# Patient Record
Sex: Male | Born: 1940
Health system: Southern US, Community
[De-identification: ages and names within clinical notes are randomized; demographics above are authoritative.]

## PROBLEM LIST (undated history)

## (undated) DIAGNOSIS — I10 Essential (primary) hypertension: Secondary | ICD-10-CM

## (undated) DIAGNOSIS — I509 Heart failure, unspecified: Secondary | ICD-10-CM

## (undated) DIAGNOSIS — J449 Chronic obstructive pulmonary disease, unspecified: Secondary | ICD-10-CM

## (undated) DIAGNOSIS — I255 Ischemic cardiomyopathy: Secondary | ICD-10-CM

## (undated) DIAGNOSIS — I2510 Atherosclerotic heart disease of native coronary artery without angina pectoris: Secondary | ICD-10-CM

## (undated) DIAGNOSIS — E785 Hyperlipidemia, unspecified: Secondary | ICD-10-CM

## (undated) DIAGNOSIS — Z8614 Personal history of Methicillin resistant Staphylococcus aureus infection: Secondary | ICD-10-CM

## (undated) DIAGNOSIS — E119 Type 2 diabetes mellitus without complications: Secondary | ICD-10-CM

## (undated) DIAGNOSIS — N189 Chronic kidney disease, unspecified: Secondary | ICD-10-CM

## (undated) DIAGNOSIS — Z720 Tobacco use: Secondary | ICD-10-CM

## (undated) HISTORY — DX: Atherosclerotic heart disease of native coronary artery without angina pectoris: I25.10

## (undated) HISTORY — DX: Hyperlipidemia, unspecified: E78.5

## (undated) HISTORY — DX: Personal history of Methicillin resistant Staphylococcus aureus infection: Z86.14

## (undated) HISTORY — PX: TOTAL SHOULDER REPLACEMENT: SUR1217

## (undated) HISTORY — DX: Type 2 diabetes mellitus without complications: E11.9

## (undated) NOTE — *Deleted (*Deleted)
Speech Language Pathology Discharge Summary  Patient Details  Name: [name] MRN: [id number] Date of Birth: Jul 29, 1940  {chl ip rehab slp time calculations:[id number]}   Skilled Therapeutic Interventions:  ***     Patient has met 6 of 8 long term goals.  Patient to discharge at overall Mod level.  Reasons goals not met: increased moderate cueing required for basic problem solving, fluctuating levels of cueing required for sustained attention, depending on internal distractability and overall engagement/willingness to participate   Clinical Impression/Discharge Summary:   Pt made slow functional gains and met 6 out of 8 long term goals this admission, although most goals were downgraded during his stay. Pt currently requires Moderate assist for basic familiar tasks due to cognitive impairments impacting his attention, problem solving, intellectual and safety awareness, and short term memory. He has also demonstrated some behaviors that have limited his participation and engagement in functional therapy tasks, further limiting his progress. As a result of his current cognitive deficits, he will require strict 24/7 supervision and hands on care at discharge. Pt is consuming a puree (dys 1) texture diet with thin liquids and uses strategies for oral clearance with min a verbal cues from clinician. His speech is ~80% intelligible in conversation mainly due to low vocal intensity and mild articulatory imprecision; Min-Mod A cueing required for implementation of strategies to compensate for dysarthria. Pt has demonstrated improved sustained attention, oropharyngeal swallow function, and orientation. However, given significant cognitive-linguistic and oral phrase swallow deficits still present, recommend pt continue to receive skilled ST services upon discharge - SNF is recommended by ST given current swallow and cognitive-linguistic deficits and high level of care required for pt's safety. Comment on  education  Care Partner:  Caregiver Able to Provide Assistance: No     Recommendation:  Skilled Nursing facility;24 hour supervision/assistance  Rationale for SLP Follow Up: Maximize cognitive function and independence;Reduce caregiver burden;Maximize functional communication;Maximize swallowing safety   Equipment: none   Reasons for discharge: Discharged from hospital   Patient/Family Agrees with Progress Made and Goals Achieved: Yes    [name] 03/21/2020, 12:17 PM

---

## 2005-04-23 ENCOUNTER — Ambulatory Visit: Payer: Self-pay | Admitting: Cardiology

## 2005-04-23 ENCOUNTER — Inpatient Hospital Stay (HOSPITAL_COMMUNITY): Admission: EM | Admit: 2005-04-23 | Discharge: 2005-04-27 | Payer: Self-pay | Admitting: Emergency Medicine

## 2005-05-04 ENCOUNTER — Emergency Department (HOSPITAL_COMMUNITY): Admission: EM | Admit: 2005-05-04 | Discharge: 2005-05-05 | Payer: Self-pay | Admitting: Emergency Medicine

## 2005-05-08 ENCOUNTER — Emergency Department (HOSPITAL_COMMUNITY): Admission: EM | Admit: 2005-05-08 | Discharge: 2005-05-08 | Payer: Self-pay | Admitting: Emergency Medicine

## 2005-05-11 ENCOUNTER — Inpatient Hospital Stay (HOSPITAL_COMMUNITY): Admission: EM | Admit: 2005-05-11 | Discharge: 2005-05-21 | Payer: Self-pay | Admitting: Emergency Medicine

## 2005-05-13 ENCOUNTER — Encounter (INDEPENDENT_AMBULATORY_CARE_PROVIDER_SITE_OTHER): Payer: Self-pay | Admitting: *Deleted

## 2005-05-13 ENCOUNTER — Ambulatory Visit: Payer: Self-pay | Admitting: Infectious Diseases

## 2005-05-28 ENCOUNTER — Encounter (HOSPITAL_BASED_OUTPATIENT_CLINIC_OR_DEPARTMENT_OTHER): Admission: RE | Admit: 2005-05-28 | Discharge: 2005-06-02 | Payer: Self-pay | Admitting: Surgery

## 2005-06-06 ENCOUNTER — Emergency Department (HOSPITAL_COMMUNITY): Admission: EM | Admit: 2005-06-06 | Discharge: 2005-06-06 | Payer: Self-pay | Admitting: Emergency Medicine

## 2005-06-21 ENCOUNTER — Ambulatory Visit: Payer: Self-pay | Admitting: Cardiovascular Disease

## 2005-06-21 ENCOUNTER — Inpatient Hospital Stay (HOSPITAL_COMMUNITY): Admission: EM | Admit: 2005-06-21 | Discharge: 2005-06-23 | Payer: Self-pay | Admitting: Emergency Medicine

## 2005-06-22 ENCOUNTER — Encounter: Payer: Self-pay | Admitting: Cardiovascular Disease

## 2005-07-06 ENCOUNTER — Ambulatory Visit: Payer: Self-pay | Admitting: *Deleted

## 2005-07-20 ENCOUNTER — Ambulatory Visit: Payer: Self-pay | Admitting: Cardiology

## 2006-03-02 ENCOUNTER — Ambulatory Visit: Payer: Self-pay | Admitting: Internal Medicine

## 2006-04-27 ENCOUNTER — Ambulatory Visit (HOSPITAL_COMMUNITY): Admission: RE | Admit: 2006-04-27 | Discharge: 2006-04-27 | Payer: Self-pay | Admitting: Urology

## 2006-04-28 ENCOUNTER — Ambulatory Visit: Payer: Self-pay | Admitting: Internal Medicine

## 2006-05-30 ENCOUNTER — Ambulatory Visit: Admission: RE | Admit: 2006-05-30 | Discharge: 2006-08-28 | Payer: Self-pay | Admitting: Radiation Oncology

## 2006-06-15 ENCOUNTER — Ambulatory Visit: Payer: Self-pay | Admitting: Cardiovascular Disease

## 2006-08-02 ENCOUNTER — Encounter: Admission: RE | Admit: 2006-08-02 | Discharge: 2006-08-02 | Payer: Self-pay | Admitting: Orthopaedic Surgery

## 2006-08-10 ENCOUNTER — Emergency Department (HOSPITAL_COMMUNITY): Admission: EM | Admit: 2006-08-10 | Discharge: 2006-08-10 | Payer: Self-pay | Admitting: Emergency Medicine

## 2006-08-15 ENCOUNTER — Ambulatory Visit (HOSPITAL_COMMUNITY): Admission: RE | Admit: 2006-08-15 | Discharge: 2006-08-15 | Payer: Self-pay | Admitting: Orthopaedic Surgery

## 2006-08-22 ENCOUNTER — Ambulatory Visit (HOSPITAL_COMMUNITY): Admission: RE | Admit: 2006-08-22 | Discharge: 2006-08-22 | Payer: Self-pay | Admitting: Orthopaedic Surgery

## 2006-09-02 ENCOUNTER — Ambulatory Visit: Payer: Self-pay | Admitting: Cardiology

## 2006-10-09 ENCOUNTER — Emergency Department (HOSPITAL_COMMUNITY): Admission: EM | Admit: 2006-10-09 | Discharge: 2006-10-09 | Payer: Self-pay | Admitting: *Deleted

## 2007-11-29 ENCOUNTER — Ambulatory Visit: Payer: Self-pay | Admitting: Cardiology

## 2008-05-27 DIAGNOSIS — F17200 Nicotine dependence, unspecified, uncomplicated: Secondary | ICD-10-CM | POA: Insufficient documentation

## 2008-05-27 DIAGNOSIS — I2510 Atherosclerotic heart disease of native coronary artery without angina pectoris: Secondary | ICD-10-CM | POA: Insufficient documentation

## 2008-05-27 DIAGNOSIS — A4902 Methicillin resistant Staphylococcus aureus infection, unspecified site: Secondary | ICD-10-CM | POA: Insufficient documentation

## 2008-05-28 ENCOUNTER — Encounter: Payer: Self-pay | Admitting: Cardiology

## 2008-05-28 ENCOUNTER — Ambulatory Visit: Payer: Self-pay | Admitting: Cardiology

## 2008-06-10 ENCOUNTER — Ambulatory Visit: Payer: Self-pay | Admitting: Cardiology

## 2008-06-10 LAB — CONVERTED CEMR LAB
ALT: 38 units/L (ref 0–53)
AST: 28 units/L (ref 0–37)
Albumin: 3.7 g/dL (ref 3.5–5.2)
Alkaline Phosphatase: 73 units/L (ref 39–117)
Bilirubin, Direct: 0.1 mg/dL (ref 0.0–0.3)
Cholesterol: 187 mg/dL (ref 0–200)
HDL: 36.1 mg/dL — ABNORMAL LOW (ref >39.0)
LDL Cholesterol: 122 mg/dL — ABNORMAL HIGH (ref 0–99)
Total Bilirubin: 0.5 mg/dL (ref 0.3–1.2)
Total CHOL/HDL Ratio: 5.2
Total Protein: 6.3 g/dL (ref 6.0–8.3)
Triglycerides: 146 mg/dL (ref 0–149)
VLDL: 29 mg/dL (ref 0–40)

## 2010-01-30 ENCOUNTER — Ambulatory Visit: Payer: Self-pay | Admitting: Cardiology

## 2010-01-30 DIAGNOSIS — I10 Essential (primary) hypertension: Secondary | ICD-10-CM | POA: Insufficient documentation

## 2010-01-30 DIAGNOSIS — E785 Hyperlipidemia, unspecified: Secondary | ICD-10-CM | POA: Insufficient documentation

## 2010-02-04 ENCOUNTER — Telehealth (INDEPENDENT_AMBULATORY_CARE_PROVIDER_SITE_OTHER): Payer: Self-pay | Admitting: Radiology

## 2010-02-05 ENCOUNTER — Ambulatory Visit: Payer: Self-pay | Admitting: Cardiovascular Disease

## 2010-02-05 ENCOUNTER — Encounter: Payer: Self-pay | Admitting: Cardiology

## 2010-02-05 ENCOUNTER — Encounter (HOSPITAL_COMMUNITY)
Admission: RE | Admit: 2010-02-05 | Discharge: 2010-02-20 | Payer: Self-pay | Source: Home / Self Care | Attending: Cardiology | Admitting: Cardiology

## 2010-02-05 ENCOUNTER — Ambulatory Visit: Payer: Self-pay | Admitting: Cardiology

## 2010-02-05 ENCOUNTER — Ambulatory Visit: Payer: Self-pay

## 2010-02-17 ENCOUNTER — Encounter: Payer: Self-pay | Admitting: Cardiology

## 2010-02-17 LAB — CONVERTED CEMR LAB
Cholesterol: 171 mg/dL (ref 0–200)
Direct LDL: 74 mg/dL
HDL: 30.3 mg/dL — ABNORMAL LOW (ref >39.00)
Total CHOL/HDL Ratio: 6
Triglycerides: 406 mg/dL — ABNORMAL HIGH (ref 0.0–149.0)
VLDL: 81.2 mg/dL — ABNORMAL HIGH (ref 0.0–40.0)

## 2010-02-21 ENCOUNTER — Inpatient Hospital Stay (HOSPITAL_COMMUNITY): Admission: EM | Admit: 2010-02-21 | Discharge: 2010-02-24 | Payer: Self-pay | Admitting: Cardiology

## 2010-02-21 ENCOUNTER — Ambulatory Visit: Payer: Self-pay | Admitting: Cardiology

## 2010-02-24 ENCOUNTER — Telehealth (INDEPENDENT_AMBULATORY_CARE_PROVIDER_SITE_OTHER): Payer: Self-pay | Admitting: *Deleted

## 2010-03-09 ENCOUNTER — Encounter: Payer: Self-pay | Admitting: Cardiology

## 2010-03-10 ENCOUNTER — Ambulatory Visit: Payer: Self-pay | Admitting: Cardiology

## 2010-04-28 ENCOUNTER — Encounter: Payer: Self-pay | Admitting: Cardiology

## 2010-05-26 NOTE — Assessment & Plan Note (Signed)
Summary: Cardiology Nuclear Testing  Nuclear Med Background Indications for Stress Test: Evaluation for Ischemia   History: Echo, Heart Catheterization, Myocardial Infarction  History Comments: '07 MI>Cath:Totalled Diag, 50%LAD (Med Tx)  Symptoms: Chest Pain  Symptoms Comments: Last episode of CP:2 days ago.   Nuclear Pre-Procedure Cardiac Risk Factors: Hypertension, Lipids, NIDDM, Smoker Caffeine/Decaff Intake: None NPO After: 6:00 PM Lungs: Clear IV 0.9% NS with Angio Cath: 22g     IV Site: R Antecubital IV Started by: Bonnita Levan, RN Chest Size (in) 42     Height (in): 71 Weight (lb): 188 BMI: 26.32 Tech Comments: No medications taken today.  Nuclear Med Study 1 or 2 day study:  1 day     Stress Test Type:  Treadmill/Lexiscan Reading MD:  Olga Millers, MD     Referring MD:  Rollene Rotunda, MD Resting Radionuclide:  Technetium 36m Tetrofosmin     Resting Radionuclide Dose:  11 mCi  Stress Radionuclide:  Technetium 59m Tetrofosmin     Stress Radionuclide Dose:  33 mCi   Stress Protocol Exercise Time (min):  4:01 min     Max HR:  110 bpm     Predicted Max HR:  153 bpm  Max Systolic BP: 236 mm Hg     Percent Max HR:  71.90 %     METS: 4.8 Rate Pressure Product:  57846    Stress Test Technologist:  Rea College, CMA-N     Nuclear Technologist:  Doyne Keel, CNMT  Rest Procedure  Myocardial perfusion imaging was performed at rest 45 minutes following the intravenous administration of Technetium 38m Tetrofosmin.  Stress Procedure  Patient initially walked the treadmill utilizing the Bruce protocol for 3:00, but was unable to complete the test due to hypertensive response, 236/95.  He then received IV Lexiscan 0.4 mg over 15-seconds and then Technetium 52m Tetrofosmin was injected at 30-seconds.  There were no significant changes with Lexiscan, other than a rare PVC.  Quantitative spect images were obtained after a 45 minute delay.  QPS Raw Data Images:  Acquisition  technically good; normal left ventricular size. Stress Images:  There is decreased uptake in the inferior wall and apex. Rest Images:  There is decreased uptake in the inferior wall and apex, less prominent compared to the stress images. Subtraction (SDS):  These findings are consistent with prior inferoapical infarct and mild peri-infarct ischemia. Transient Ischemic Dilatation:  .85  (Normal <1.22)  Lung/Heart Ratio:  .33  (Normal <0.45)  Quantitative Gated Spect Images QGS EDV:  96 ml QGS ESV:  41 ml QGS EF:  57 % QGS cine images:  Apical hypokinesis.   Overall Impression  Exercise Capacity: Lexiscan with low level exercise BP Response: Hypertensive blood pressure response. Clinical Symptoms: No chest pain ECG Impression: No significant ST segment change suggestive of ischemia. Overall Impression: Abnormal lexiscan nuclear study with prior inferoapical infarct and mild peri-infarct ischemia.  Appended Document: Nuclear Test   Low risk scan.  Further evaluation indicated.  Appended Document: Cardiology Nuclear Testing Low risk study.  No further evaluation indicated.  Appended Document: Cardiology Nuclear Testing pt aware of results

## 2010-05-26 NOTE — Assessment & Plan Note (Signed)
Summary: EPH/JML   Visit Type:  Follow-up Primary Provider:  Dr. Deatra Corleone, MD  CC:  CAD.  History of Present Illness: The patient presents for followup of known coronary disease. He was in the hospital with chest discomfort on October 29. Cardiac catheterization demonstrated LAD:  The LAD is normal in its proximal aspect.  LAD just after the perforator has an 80% eccentric stenosis followed by a 30-40% stenosis just beyond that.  Left circumflex uminal irregularities.  The first obtuse marginal small subbranch, there is a 40% stenosis. The AV groove circumflex beyond the OM and its mid distal portion has a 50% stenosis present. The right coronary artery is a large, dominant vessel. The midportion of the vessel has a 50% stenosis. Ejection fraction is estimated at 45%.  He had DES stenting of the LAD.  Since at presentation he has had some mild fleeting chest discomfort similar to but not nearly as noticeable as his previous angina. He has not felt like he needed to take nitroglycerin. He has been eating better. He is about to start cardiac rehabilitation. He has been doing little exercise without being and will bring on symptoms. He has very mild shortness of breath but denies any PND or orthopnea. He is not reporting palpitations, presyncope or syncope.  Current Medications (verified): 1)  Isosorbide Mononitrate Cr 60 Mg Xr24h-Tab (Isosorbide Mononitrate) .... Two Pills Daily 2)  Ramipril 10 Mg Caps (Ramipril) .Marland Kitchen.. 1 By Mouth Daily 3)  Lipitor 40 Mg Tabs (Atorvastatin Calcium) .... Take One Tablet By Mouth Daily. 4)  Aspirin 325 Mg  Tabs (Aspirin) .Marland Kitchen.. 1 By Mouth Daily 5)  Eql Fish Oil 1000 Mg Caps (Omega-3 Fatty Acids) .... Daily 6)  Nitrostat 0.4 Mg Subl (Nitroglycerin) .... As Directed For Chest Pain 7)  Metformin Hcl 1000 Mg Tabs (Metformin Hcl) .... Take 1 Tablet By Mouth Two Times A Day 8)  Fenofibrate 54 Mg Tabs (Fenofibrate) .... One Daily 9)  Coreg 12.5 Mg Tabs (Carvedilol) .Marland Kitchen.. 1  By Mouth Two Times A Day 10)  Plavix 75 Mg Tabs (Clopidogrel Bisulfate) .Marland Kitchen.. 1 By Mouth Daily 11)  Hydrocodone-Acetaminophen 5-500 Mg Tabs (Hydrocodone-Acetaminophen) .... As Needed 12)  Isosorbide Mononitrate Cr 60 Mg Xr24h-Tab (Isosorbide Mononitrate) .Marland Kitchen.. 1 By Mouth Daily 13)  Multivitamins   Tabs (Multiple Vitamin) .Marland Kitchen.. 1 By Mouth Daily 14)  Oxycontin 40 Mg Xr12h-Tab (Oxycodone Hcl) .... As Needed  Allergies (verified): No Known Drug Allergies  Past History:  Past Medical History: 1. Coronary artery disease (status post myocardial infarction June 21, 2005 secondary to distal occlusion of diagonal treated     medically.  LAD 80% treated with DES Oct 2011)  2. 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 3. History of a disseminated MRSA. 4. Diabetes mellitus. 5. Dyslipidemia. 6. Tobacco abuse  Review of Systems       As stated in the HPI and negative for all other systems.   Vital Signs:  Patient profile:   70 year old male Height:      71 inches Weight:      187 pounds BMI:     26.18 Pulse rate:   58 / minute Resp:     16 per minute BP sitting:   128 / 60  (right arm)  Vitals Entered By: Marrion Coy, CNA (March 10, 2010 4:18 PM)  Physical Exam  General:  Well developed, well nourished, in no acute distress. Head:  normocephalic and atraumatic Eyes:  PERRLA/EOM intact; conjunctiva and  lids normal. Neck:  Neck supple, no JVD. No masses, thyromegaly or abnormal cervical nodes. Chest Wall:  no deformities or breast masses noted Lungs:  Clear Abdomen:  Bowel sounds positive; abdomen soft and non-tender without masses, organomegaly, or hernias noted. No hepatosplenomegaly. Msk:  Back normal, normal gait. Muscle strength and tone normal. Extremities:  No clubbing or cyanosis. Neurologic:  Alert and oriented x 3. Skin:  Intact without lesions or rashes. Cervical Nodes:  no significant adenopathy Axillary Nodes:  no significant adenopathy Inguinal Nodes:  no  significant adenopathy Psych:  Normal affect.   Detailed Cardiovascular Exam  Neck    Carotids: Carotids full and equal bilaterally without bruits.      Neck Veins: Normal, no JVD.    Heart    Inspection: no deformities or lifts noted.      Palpation: normal PMI with no thrills palpable.      Auscultation: regular rate and rhythm, S1, S2 without murmurs, rubs, gallops, or clicks.    Vascular    Abdominal Aorta: no palpable masses, pulsations, or audible bruits.      Femoral Pulses: normal femoral pulses bilaterally.      Pedal Pulses: pulses normal in all 4 extremities    Radial Pulses: normal radial pulses bilaterally.      Peripheral Circulation: no clubbing, cyanosis, or edema noted with normal capillary refill.     EKG  Procedure date:  03/10/2010  Findings:      Sinus rhythm, rate 59, axis within normal limits, intervals within normal limits, no acute ST-T changes.  Impression & Recommendations:  Problem # 1:  CORONARY ARTERY DISEASE (ICD-414.00) The patient is now status post drug-eluting stent to the LAD. We will concentrate on risk reduction. Orders: EKG w/ Interpretation (93000)  Problem # 2:  DYSLIPIDEMIA (ICD-272.4) His LDL in the hospital was 97 and HDL 42. He has changed his diet. We will reassess this in about 3 months. I think the goal should be an LDL less than 70 and HDL greater than 50.  Problem # 3:  ESSENTIAL HYPERTENSION, BENIGN (ICD-401.1) His blood pressure is treated he will continue the meds as listed.  Problem # 4:  * MILDLY REDUCE EF He has a mildly reduced ejection fraction but no symptoms. He will continue with meds as listed.  Patient Instructions: 1)  Your physician recommends that you schedule a follow-up appointment in: 6 MONTHS WITH DR [facility] 2)  Your physician recommends that you continue on your current medications as directed. Please refer to the Current Medication list given to you today.

## 2010-05-26 NOTE — Miscellaneous (Signed)
Summary: RX for fenofibrate 54mg   Clinical Lists Changes  Medications: Added new medication of FENOFIBRATE 54 MG TABS (FENOFIBRATE) one daily - Signed Rx of FENOFIBRATE 54 MG TABS (FENOFIBRATE) one daily;  #30 x 11;  Signed;  Entered by: Charolotte Capuchin, RN;  Authorized by: Rollene Rotunda, MD, [facility];  Method used: Electronically to [facility] [phone]*, [address], [place], [place], [place]  [address], Ph: [phone] or [phone], Fax: [phone]    Prescriptions: FENOFIBRATE 54 MG TABS (FENOFIBRATE) one daily  #30 x 11   Entered by:   Charolotte Capuchin, RN   Authorized by:   Rollene Rotunda, MD, [facility]   Signed by:   Charolotte Capuchin, RN on 02/17/2010   Method used:   [facility]ronically to        [facility] [phone]* (retail)       [address]       [place], [place]  [address]       Ph: [phone] or [phone]       Fax: [phone]   RxID:   [id number]

## 2010-05-26 NOTE — Assessment & Plan Note (Signed)
Summary: Cardiology Office Visit   Referred by:  Deatra Khaliq, MD  Chief Complaint:  Coronary Artery Disase.  History of Present Illness: The patient is a pleasant 70 year old gentleman with coronary artery disease as described below. He returns for six-month followup. He has done relatively well since I last saw him. He does get chest pain about once a week. For this he may take up to 2 nitroglycerin. However, this has been a stable pattern. This pain happens at rest. It is substernal. It can be 6/10 in intensity. He may get nausea with it. It does not radiate. It is similar to pain he describes 6 months ago and even before this. He is able to ride his bicycle 8 miles at a time without bringing on this discomfort. He does not describe PND or orthopnea though occasionally he feels like he needs to take a deep breath. He does not have any palpitations, presyncope or syncope. He is taking his medications as stated as he can now we'll forward his bowel or drugs. He has not had his lipids checked recently.    Updated Prior Medication List: ISOSORBIDE MONONITRATE CR 60 MG XR24H-TAB (ISOSORBIDE MONONITRATE) two pills daily RAMIPRIL 5 MG CAPS (RAMIPRIL) one by mouth daily ZOCOR 40 MG TABS (SIMVASTATIN) one by mouth at bedtime ANACIN 81 MG TBEC (ASPIRIN) two by mouth daily EQL FISH OIL 1000 MG CAPS (OMEGA-3 FATTY ACIDS) daily    Past Medical History:    Reviewed history from 05/27/2008 and no changes required:       1. Coronary artery disease (status post myocardial infarction June 21, 2005 secondary to distal occlusion of diagonal treated           medically, 50% LAD stenosis),        2. 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       3. History of a disseminated MRSA.       4. Diabetes mellitus.       5. Dyslipidemia.       6. Tobacco abuse  Past Surgical History:    Reviewed history from 05/27/2008 and no changes required:        Right shoulder  replacement.    Family History:    Reviewed history from 05/27/2008 and no changes required:       Mother deceased at age 61 secondary to complications from        surgery. Father deceased age 89, complications from prostate cancer; history        of strokes. Siblings have no known coronary disease.  Social History:    Reviewed history from 05/27/2008 and no changes required:       The patient lives alone in [place]. He is a Therapist, occupational. The        patient has one daughter and is separated from his wife. He smokes an        occasional cigar and denies alcohol use.   Risk Factors:  Tobacco use:  current    Year started:  1982    Cigars:  Yes -- 4 per week    Counseled to quit/cut down tobacco use:  yes Passive smoke exposure:  no Drug use:  no HIV high-risk behavior:  no Caffeine use:  3 drinks per day Alcohol use:  no Exercise:  yes    Times per week:  2  Type:  Bike Seatbelt use:  100 % Sun Exposure:  rarely  Family History Risk Factors:    Family History of MI in females < 41 years old:  no    Family History of MI in males < 31 years old:  no    Review of Systems       Review of systems as stated in the history of present illness. Otherwise negative for all systems.   Vital Signs:  Patient Profile:   70 Years Old Male Height:     71 inches Weight:      188 pounds BMI:     26.32 Pulse rate:   72 / minute Resp:     16 per minute BP sitting:   140 / 78  (right arm)                 Physical Exam  General:     Well developed, well nourished, in no acute distress. Head:     normocephalic and atraumatic Eyes:     PERRLA/EOM intact; conjunctiva and lids normal. Mouth:     Teeth, gums and palate normal. Oral mucosa normal. Neck:     Neck supple, no JVD. No masses, thyromegaly or abnormal cervical nodes. Chest Wall:     no deformities or breast masses noted Lungs:     Clear bilaterally to auscultation and percussion. Abdomen:     Bowel sounds  positive; abdomen soft and non-tender without masses, organomegaly, or hernias noted. No hepatosplenomegaly. Msk:     Back normal, normal gait. Muscle strength and tone normal. Pulses:     pulses normal in all 4 extremities Extremities:     No clubbing or cyanosis. Neurologic:     Alert and oriented x 3. Skin:     Intact without lesions or rashes. Cervical Nodes:     no significant adenopathy Axillary Nodes:     no significant adenopathy Inguinal Nodes:     no significant adenopathy Psych:     Normal affect.   Detailed Cardiovascular Exam  Neck    Carotids: Carotids full and equal bilaterally without bruits.      Neck Veins: Normal, no JVD.    Heart    Inspection: no deformities or lifts noted.      Palpation: normal PMI with no thrills palpable.      Auscultation: regular rate and rhythm, S1, S2 without murmurs, rubs, gallops, or clicks.    Vascular    Abdominal Aorta: no palpable masses, pulsations, or audible bruits.      Femoral Pulses: normal femoral pulses bilaterally.      Pedal Pulses: normal pedal pulses bilaterally.      Radial Pulses: normal radial pulses bilaterally.      Peripheral Circulation: no clubbing, cyanosis, or edema noted with normal capillary refill.     EKG  Procedure date:  05/28/2008  Findings:      Sinus bradycardia, premature ectopic complexes, rate 53, axis within normal limits, intervals within normal limits, no acute ST-T wave changes.    Impression & Recommendations:  Problem # 1:  CORONARY ARTERY DISEASE (ICD-414.00) The patient has some stable atypical chest pain. No further cardiovascular testing is suggested. I did emphasize secondary risk reduction. We need to be much more aggressive with this.   Problem # 2:  TOBACCO ABUSE (ICD-305.1) I counseled him to stop smoking cigars.  Problem # 3:  HTN I will treat this in the context of treating his mildly reduced ejection fraction.  Problem # 4:  Mildly reduced EF The  patient's ejection fraction is approximately 45%. To further manage this and his hypertension I will increase his ramipril to 10 mg daily.  Problem # 5:  Dyslipidiemia I will have the patient come back for fasting lipid profile and liver enzymes. The goal will be an LDL less than 100 and HDL greater than 40.  Medications Added to Medication List This Visit: 1)  Isosorbide Mononitrate Cr 60 Mg Xr24h-tab (Isosorbide mononitrate) .... Two pills daily 2)  Ramipril 5 Mg Caps (Ramipril) .... One by mouth daily 3)  Zocor 40 Mg Tabs (Simvastatin) .... One by mouth at bedtime 4)  Anacin 81 Mg Tbec (Aspirin) .... Two by mouth daily 5)  Eql Fish Oil 1000 Mg Caps (Omega-3 fatty acids) .... Daily   Patient Instructions: 1)  The patient will return for followup in 6 months.

## 2010-05-26 NOTE — Assessment & Plan Note (Signed)
Summary: ROV  Medications Added RAMIPRIL 5 MG CAPS (RAMIPRIL) Take one capsule by mouth daily LIPITOR 40 MG TABS (ATORVASTATIN CALCIUM) Take one tablet by mouth daily. ASPIRIN 81 MG TBEC (ASPIRIN) Take one tablet by mouth daily METFORMIN HCL 1000 MG TABS (METFORMIN HCL) Take 1 tablet by mouth two times a day      Allergies Added: NKDA  Visit Type:  Follow-up Primary Provider:  Dr. Deatra Yuriy, MD  CC:  CAD.  History of Present Illness: The patient presents for followup of his known coronary disease. Since I last saw him he has continued to occasionally need nitroglycerin. This has been a long-standing pattern and apparently has not changed. He says he takes about 25 pills in 6 months. He always takes 2 at a time. The pain is like his previous myocardial infarction pain but less intense. It is a burning discomfort with no radiation or associated symptoms. It goes away after the nitroglycerin. It happens at rest. He is able to exercise vigorously without bringing this on. He denies any new shortness of breath, PND or orthopnea. He denies any palpitations, presyncope or syncope.  Current Medications (verified): 1)  Isosorbide Mononitrate Cr 60 Mg Xr24h-Tab (Isosorbide Mononitrate) .... Two Pills Daily 2)  Ramipril 5 Mg Caps (Ramipril) .... Take One Capsule By Mouth Daily 3)  Lipitor 40 Mg Tabs (Atorvastatin Calcium) .... Take One Tablet By Mouth Daily. 4)  Aspirin 81 Mg Tbec (Aspirin) .... Take One Tablet By Mouth Daily 5)  Eql Fish Oil 1000 Mg Caps (Omega-3 Fatty Acids) .... Daily 6)  Nitrostat 0.4 Mg Subl (Nitroglycerin) .... As Directed For Chest Pain 7)  Metformin Hcl 1000 Mg Tabs (Metformin Hcl) .... Take 1 Tablet By Mouth Two Times A Day  Allergies (verified): No Known Drug Allergies  Past History:  Past Medical History: Reviewed history from 05/27/2008 and no changes required. 1. Coronary artery disease (status post myocardial infarction June 21, 2005 secondary to  distal occlusion of diagonal treated     medically, 50% LAD stenosis),  2. 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 3. History of a disseminated MRSA. 4. Diabetes mellitus. 5. Dyslipidemia. 6. Tobacco abuse  Past Surgical History: Reviewed history from 05/27/2008 and no changes required.  Right shoulder replacement.   Review of Systems       As stated in the HPI and negative for all other systems.   Vital Signs:  Patient profile:   70 year old male Height:      71 inches Weight:      188 pounds BMI:     26.32 Pulse rhythm:   regular Resp:     18 per minute BP sitting:   146 / 68  (right arm) Cuff size:   large  Vitals Entered By: Vikki Ports (January 30, 2010 4:34 PM)  Physical Exam  General:  Well developed, well nourished, in no acute distress. Head:  normocephalic and atraumatic Mouth:  Teeth, gums and palate normal. Oral mucosa normal. Neck:  Neck supple, no JVD. No masses, thyromegaly or abnormal cervical nodes. Chest Wall:  no deformities or breast masses noted Lungs:  Clear bilaterally to auscultation and percussion. Abdomen:  Bowel sounds positive; abdomen soft and non-tender without masses, organomegaly, or hernias noted. No hepatosplenomegaly. Msk:  Back normal, normal gait. Muscle strength and tone normal. Extremities:  No clubbing or cyanosis. Neurologic:  Alert and oriented x 3. Skin:  Intact without lesions  or rashes. Cervical Nodes:  no significant adenopathy Inguinal Nodes:  no significant adenopathy Psych:  Normal affect.   Detailed Cardiovascular Exam  Neck    Carotids: Carotids full and equal bilaterally without bruits.      Neck Veins: Normal, no JVD.    Heart    Inspection: no deformities or lifts noted.      Palpation: normal PMI with no thrills palpable.      Auscultation: regular rate and rhythm, S1, S2 without murmurs, rubs, gallops, or clicks.    Vascular    Abdominal Aorta: no palpable masses,  pulsations, or audible bruits.      Femoral Pulses: normal femoral pulses bilaterally.      Pedal Pulses: pulses normal in all 4 extremities    Radial Pulses: normal radial pulses bilaterally.      Peripheral Circulation: no clubbing, cyanosis, or edema noted with normal capillary refill.     EKG  Procedure date:  01/30/2010  Findings:      Sinus bradycardia, rate 55, axis within normal limits, intervals within normal limits, no acute ST-T wave changes.  Impression & Recommendations:  Problem # 1:  CORONARY ARTERY DISEASE (ICD-414.00) We had a long discussion today about the treatment of coronary disease nonobstructive in the distal vessel occlusion that he had. I spent quite a bit of time trying to thoroughly address concerns he had about not intervening on a 50% blockage or total distal occlusion. I think he had a renewed understanding of this. It has been several years since his evaluation and he does have chest discomfort as described. Followup with stress perfusion imaging is indicated. We discussed at great length all of the necessary ingredients for secondary risk reduction. Orders: Nuclear Stress Test (Nuc Stress Test)  Problem # 2:  TOBACCO ABUSE (ICD-305.1) He uses cigars. I suggested complete tobacco cessation.  Problem # 3:  ESSENTIAL HYPERTENSION, BENIGN (ICD-401.1) His blood pressure is very slightly elevated. He will keep a blood pressure diary at home and we will manage this to a goal of less than 140/90.  Problem # 4:  DYSLIPIDEMIA (ICD-272.4) He has not had recent lipids and I will arrange for these to be done when he comes back for a stress test with a goal LDL less than 100 and HDL greater than 40.  Patient Instructions: 1)  Your physician recommends that you schedule a follow-up appointment in: 12 MONTHS 2)  Your physician recommends that you return for lab work in: FASTING LIPIDS ON DAY OF EXERCISE STREE TEST 3)  Your physician has requested that you have an  exercise stress myoview.  For further information please visit [email or web address]  Please follow instruction sheet, as given.

## 2010-05-26 NOTE — Progress Notes (Signed)
Summary: N/V  Phone Note Call from Patient Call back at [phone]   Caller: [facility] Summary of Call: Returned call from pt's niece Debbe Bales who was concerned with N and 3 episodes of vomiting since the patient was discharged today, s/p PCI.  Pt denies any associated symptoms of chest pain, SOB, or diaphoresis.  Currently afebrile, no chills, negative blood in V.  Informed pt to stay hydrated and if N/V persist to call PCP in the am.  I have also called in several zofran pills([facility]) if these are needed.  If pt starts having CP or other symptoms like previous anginal symptoms he is to go to the ER.  The pt's niece voiced understanding and appreciated the call back.   Initial call taken by: Robbi Garter NP-PA,  February 24, 2010 9:10 PM

## 2010-05-26 NOTE — Miscellaneous (Signed)
Summary: Allergy  Allergy   Imported By: Elenor Legato 02/05/2010 07:13:34  _____________________________________________________________________  External Attachment:    Type:   Image     Comment:   External Document

## 2010-05-26 NOTE — Progress Notes (Signed)
Summary: Nuc Pre-Procedure  Phone Note Outgoing Call   Call placed by: Leonia Corona, RT-N,  February 04, 2010 1:59 PM Call placed to: Patient Reason for Call: Confirm/change Appt Summary of Call: Left message with information on Myoview Information Sheet (see scanned document for details).      Nuclear Med Background Indications for Stress Test: Evaluation for Ischemia   History: Echo, Heart Catheterization, Myocardial Infarction  History Comments: 2007- MI> Cath- Totalled Diag, 50%LAD(Med Tx) 2007- Echo- EF= 45%  Symptoms: Chest Pain    Nuclear Pre-Procedure Cardiac Risk Factors: Hypertension, Lipids, NIDDM, Smoker Height (in): 71

## 2010-05-26 NOTE — Letter (Signed)
Summary: Recall, Labs Needed  [facility], Main Office  [address]   [place], [place] [address]   Phone: [phone]  Fax: [phone]             February 17, 2010 MRN: [id number]   [name] [address] [place], [place]  [address]   Dear Mr. Harvie,  Our records indicate that it will be  time to repeat your blood work around the week of April 20, 2010.   After your labs are drawn, our office will call you within a week to ten business days with the results.  If you do not hear from [place] in 10 business days, you should call the office.  If you have any questions regarding this, call the office at the number listed above, and ask for the nurse.  Labs are due:     Fasting Lipid and liver profile  272.0  v58.69                          Sincerely,      Sander Nephew, RN for  Dr Rollene Rotunda [facility]

## 2010-05-26 NOTE — Miscellaneous (Signed)
  Clinical Lists Changes  Observations: Added new observation of CARDCATHFIND: ASSESSMENT: 1. Severe mid-left anterior descending artery stenosis with successful     percutaneous intervention. 2. Moderate left circumflex/right coronary artery stenosis. 3. Mild left ventricular dysfunction consistent with prior apical     infarct.   RECOMMENDATIONS:  The patient needs aggressive risk factor modification. This should include tobacco cessation.  He has had a good bit of plaque progression since his previous cath.  He should remain on dual antiplatelet therapy with aspirin and Plavix for minimum of 12 months following drug-eluting stent implantation.       (02/23/2010 9:42) Added new observation of NUCLEAR NOS: Exercise Capacity: Lexiscan with low level exercise BP Response: Hypertensive blood pressure response. Clinical Symptoms: No chest pain ECG Impression: No significant ST segment change suggestive of ischemia. Overall Impression: Abnormal lexiscan nuclear study with prior inferoapical infarct and mild peri-infarct ischemia. (02/05/2010 9:41)      Nuclear Study  Procedure date:  02/05/2010  Findings:      Exercise Capacity: Lexiscan with low level exercise BP Response: Hypertensive blood pressure response. Clinical Symptoms: No chest pain ECG Impression: No significant ST segment change suggestive of ischemia. Overall Impression: Abnormal lexiscan nuclear study with prior inferoapical infarct and mild peri-infarct ischemia.  Cardiac Cath  Procedure date:  02/23/2010  Findings:      ASSESSMENT: 1. Severe mid-left anterior descending artery stenosis with successful     percutaneous intervention. 2. Moderate left circumflex/right coronary artery stenosis. 3. Mild left ventricular dysfunction consistent with prior apical     infarct.   RECOMMENDATIONS:  The patient needs aggressive risk factor modification. This should include tobacco cessation.  He has had a good bit  of plaque progression since his previous cath.  He should remain on dual antiplatelet therapy with aspirin and Plavix for minimum of 12 months following drug-eluting stent implantation.

## 2010-07-07 LAB — CBC
HCT: 37.9 % — ABNORMAL LOW (ref 39.0–52.0)
Hemoglobin: 13.1 g/dL (ref 13.0–17.0)
MCH: 30 pg (ref 26.0–34.0)
MCHC: 34.6 g/dL (ref 30.0–36.0)
MCV: 86.7 fL (ref 78.0–100.0)
Platelets: 179 10*3/uL (ref 150–400)
RBC: 4.37 MIL/uL (ref 4.22–5.81)
RDW: 12.2 % (ref 11.5–15.5)
WBC: 5.5 10*3/uL (ref 4.0–10.5)

## 2010-07-07 LAB — BASIC METABOLIC PANEL
BUN: 10 mg/dL (ref 6–23)
CO2: 25 mEq/L (ref 19–32)
Calcium: 9 mg/dL (ref 8.4–10.5)
Chloride: 106 mEq/L (ref 96–112)
Creatinine, Ser: 1.2 mg/dL (ref 0.4–1.5)
GFR calc Af Amer: >60 mL/min (ref >60)
GFR calc non Af Amer: >60 mL/min (ref >60)
Glucose, Bld: 136 mg/dL — ABNORMAL HIGH (ref 70–99)
Potassium: 3.6 mEq/L (ref 3.5–5.1)
Sodium: 139 mEq/L (ref 135–145)

## 2010-07-07 LAB — GLUCOSE, CAPILLARY: Glucose-Capillary: 135 mg/dL — ABNORMAL HIGH (ref 70–99)

## 2010-07-07 NOTE — Letter (Signed)
Summary: [facility] No Show Fax  [facility] No Show Fax   Imported By: Kassie Mends 06/30/2010 11:04:30  _____________________________________________________________________  External Attachment:    Type:   Image     Comment:   External Document

## 2010-07-08 LAB — GLUCOSE, CAPILLARY
Glucose-Capillary: 114 mg/dL — ABNORMAL HIGH (ref 70–99)
Glucose-Capillary: 115 mg/dL — ABNORMAL HIGH (ref 70–99)
Glucose-Capillary: 117 mg/dL — ABNORMAL HIGH (ref 70–99)
Glucose-Capillary: 139 mg/dL — ABNORMAL HIGH (ref 70–99)
Glucose-Capillary: 148 mg/dL — ABNORMAL HIGH (ref 70–99)
Glucose-Capillary: 169 mg/dL — ABNORMAL HIGH (ref 70–99)
Glucose-Capillary: 179 mg/dL — ABNORMAL HIGH (ref 70–99)
Glucose-Capillary: 197 mg/dL — ABNORMAL HIGH (ref 70–99)

## 2010-07-08 LAB — HEPARIN LEVEL (UNFRACTIONATED)
Heparin Unfractionated: 0.5 IU/mL (ref 0.30–0.70)
Heparin Unfractionated: 0.52 IU/mL (ref 0.30–0.70)
Heparin Unfractionated: 0.71 IU/mL — ABNORMAL HIGH (ref 0.30–0.70)

## 2010-07-08 LAB — FERRITIN: Ferritin: 66 ng/mL (ref 22–322)

## 2010-07-08 LAB — CARDIAC PANEL(CRET KIN+CKTOT+MB+TROPI)
CK, MB: 2.3 ng/mL (ref 0.3–4.0)
CK, MB: 2.9 ng/mL (ref 0.3–4.0)
CK, MB: 3.6 ng/mL (ref 0.3–4.0)
Relative Index: 1.7 (ref 0.0–2.5)
Relative Index: 1.9 (ref 0.0–2.5)
Relative Index: 2.1 (ref 0.0–2.5)
Total CK: 132 U/L (ref 7–232)
Total CK: 151 U/L (ref 7–232)
Total CK: 168 U/L (ref 7–232)
Troponin I: 0.01 ng/mL (ref 0.00–0.06)
Troponin I: 0.01 ng/mL (ref 0.00–0.06)
Troponin I: <0.01 ng/mL (ref 0.00–0.06)

## 2010-07-08 LAB — BASIC METABOLIC PANEL
BUN: 12 mg/dL (ref 6–23)
CO2: 29 mEq/L (ref 19–32)
Calcium: 8.8 mg/dL (ref 8.4–10.5)
Chloride: 106 mEq/L (ref 96–112)
Creatinine, Ser: 1.13 mg/dL (ref 0.4–1.5)
GFR calc Af Amer: >60 mL/min (ref >60)
GFR calc non Af Amer: >60 mL/min (ref >60)
Glucose, Bld: 158 mg/dL — ABNORMAL HIGH (ref 70–99)
Potassium: 4 mEq/L (ref 3.5–5.1)
Sodium: 139 mEq/L (ref 135–145)

## 2010-07-08 LAB — LIPID PANEL
Cholesterol: 165 mg/dL (ref 0–200)
HDL: 42 mg/dL (ref >39)
LDL Cholesterol: 97 mg/dL (ref 0–99)
Total CHOL/HDL Ratio: 3.9 RATIO
Triglycerides: 130 mg/dL (ref <150)
VLDL: 26 mg/dL (ref 0–40)

## 2010-07-08 LAB — RETICULOCYTES
RBC.: 4.26 MIL/uL (ref 4.22–5.81)
Retic Count, Absolute: 46.9 10*3/uL (ref 19.0–186.0)
Retic Ct Pct: 1.1 % (ref 0.4–3.1)

## 2010-07-08 LAB — CBC
HCT: 34.4 % — ABNORMAL LOW (ref 39.0–52.0)
Hemoglobin: 11.5 g/dL — ABNORMAL LOW (ref 13.0–17.0)
MCH: 28.9 pg (ref 26.0–34.0)
MCHC: 33.4 g/dL (ref 30.0–36.0)
MCV: 86.4 fL (ref 78.0–100.0)
Platelets: 189 10*3/uL (ref 150–400)
RBC: 3.98 MIL/uL — ABNORMAL LOW (ref 4.22–5.81)
RDW: 12.3 % (ref 11.5–15.5)
WBC: 6.1 10*3/uL (ref 4.0–10.5)

## 2010-07-08 LAB — FOLATE: Folate: >20 ng/mL

## 2010-07-08 LAB — MRSA PCR SCREENING: MRSA by PCR: NEGATIVE

## 2010-07-08 LAB — HEMOGLOBIN A1C
Hgb A1c MFr Bld: 7.8 % — ABNORMAL HIGH (ref <5.7)
Mean Plasma Glucose: 177 mg/dL — ABNORMAL HIGH (ref <117)

## 2010-07-08 LAB — IRON AND TIBC
Iron: 91 ug/dL (ref 42–135)
Saturation Ratios: 31 % (ref 20–55)
TIBC: 294 ug/dL (ref 215–435)
UIBC: 203 ug/dL

## 2010-07-08 LAB — PROTIME-INR
INR: 1.01 (ref 0.00–1.49)
Prothrombin Time: 13.5 seconds (ref 11.6–15.2)

## 2010-07-08 LAB — VITAMIN B12: Vitamin B-12: 589 pg/mL (ref 211–911)

## 2010-07-08 LAB — PHOSPHORUS: Phosphorus: 3.7 mg/dL (ref 2.3–4.6)

## 2010-07-08 LAB — APTT: aPTT: 78 seconds — ABNORMAL HIGH (ref 24–37)

## 2010-07-08 LAB — BRAIN NATRIURETIC PEPTIDE: Pro B Natriuretic peptide (BNP): 44 pg/mL (ref 0.0–100.0)

## 2010-07-08 LAB — MAGNESIUM: Magnesium: 1.7 mg/dL (ref 1.5–2.5)

## 2010-08-05 ENCOUNTER — Other Ambulatory Visit: Payer: Self-pay | Admitting: Cardiology

## 2010-09-08 NOTE — Assessment & Plan Note (Signed)
[facility]                            CARDIOLOGY OFFICE NOTE   NAME:SILERAlphonse, [name]                          MRN:          [id number]  DATE:09/02/2006                            DOB:          Apr 15, 1941    PRIMARY CARE PHYSICIAN:  [name], M.D.   REASON FOR PRESENTATION:  Evaluate patient with non-Q-wave myocardial  infarction.   HISTORY OF PRESENT ILLNESS:  Patient is a pleasant, 70 year old  gentleman, who last saw the P.A. for the followup of the [employer].  I have not seen him in about a year.  He has done relatively well since  I last saw him.  He has had no new chest pain.  He will occasionally get  some discomfort and take a nitroglycerin.  This is sporadic.  It is  substernal.  It is a stable pattern.  He cannot bring it on.  There are  no associated symptoms.  He did recently have shoulder surgery.  He is  still having some discomfort in his shoulder and is limited in his  activities because of this.  Prior to his shoulder surgery, he was noted  to be hypotensive and in mid-April, had to go to the emergency room for  several hours to evaluate this.  He reports the workup was negative and  he was discharged home without a diagnosis.  He has not had any problems  with light-headedness, presyncope or syncope.  He has had no shortness  of breath, denies any PND or orthopnea.   PAST MEDICAL HISTORY:  Coronary artery disease (status post myocardial  infarction, June 21, 2005, secondary to distal occlusion of the  diagonal, treated medically, 50% LAD stenosis), mildly reduced ejection  fraction (45% by echo, February 2007), history of disseminated MRSA,  diabetes mellitus, dyslipidemia, cigar-smoking.   ALLERGIES:  Patient may have coughed with an ACE inhibitor.   CURRENT MEDICATIONS:  1. Lipitor 40 mg q.h.s. (out of this).  2. Toprol XL 25 mg daily.  3. Plavix 75 mg daily (out of this).  4. Glimepiride 2 mg daily.  5. Aspirin 81  mg daily.  6. Benicar 20 mg daily.  7. Omeprazole 20 mg daily.  8. Vesicare 10 mg daily.  9. Imdur 120 mg daily (out of this).   REVIEW OF SYSTEMS:  As stated in the HPI, and otherwise negative for  other systems.   PHYSICAL EXAMINATION:  The patient is in no distress.  Blood pressure  118/78, heart rate 52 and regular.  Weight 182 pounds.  Body mass index  25.  HEENT:  Eyelids unremarkable.  Pupils equal, round and reactive to  light.  Fundi not visualized.  Oral mucosa unremarkable.  NECK:  No jugular venous distention.  Wave form within normal limits.  Carotid upstroke brisk and symmetric.  No bruits, no thyromegaly.  LYMPHATICS:  No cervical, axillary or inguinal adenopathy.  LUNGS:  Clear to auscultation bilaterally.  BACK:  No costovertebral angle tenderness.  CHEST:  Unremarkable.  HEART:  PMI not displaced or sustained.  S1 and S2 within normal limits.  No S3, no S4, no clicks, no rubs, no murmurs.  ABDOMEN:  Flat, positive bowel sounds, normal in frequency and pitch.  No bruits, rebound, guarding, no midline pulsatile mass, no  hepatomegaly, no splenomegaly.  SKIN:  No rashes, no nodules.  EXTREMITIES:  2+ pulses, no edema.  NEUROLOGIC:  Oriented to person, place and time.  Cranial nerves II  through XII grossly intact.  Motor grossly intact.   EKG:  Sinus bradycardia, rate 52, axis within normal limits, intervals  within normal limits, no acute ST-wave changes.   ASSESSMENT AND PLAN:  1. Coronary artery disease:  Patient is having no new symptoms.  No      further cardiovascular testing is suggested.  We will reinforce the      need for secondary risk reduction.  2. Dyslipidemia and hyperlipidemia:  I have renewed the patient's      Lipitor.  I have encouraged him to go see his primary care doctor      to get a fasting lipid profile and liver enzymes, as well as check      of his hemoglobin A1c in about 4-6 weeks.  3. Exercise:  I have encouraged him to exercise  routinely and gave him      specific instructions about a five-day-per-week regimen.  4. Tobacco:  I counseled him (greater than three minutes) on the need      to stop smoking cigars.  5. Medications:  I have renewed his other medications, which had      expired, and encouraged him to call us before they expire.  6. Followup:  We will see the patient back in one year, or sooner, if      needed.     Rollene Rotunda, MD, [facility]     JH/MedQ  DD: 09/02/2006  DT: 09/02/2006  Job #: [id number]   cc:   [name], M.D.

## 2010-09-08 NOTE — Assessment & Plan Note (Signed)
[facility]                            CARDIOLOGY OFFICE NOTE   NAME:SILERGeroge, Gilliam                          MRN:          [id number]  DATE:11/29/2007                            DOB:          20-Feb-1941    PRIMARY CARE PHYSICIAN:  Deatra Rajinder, MD   REASON FOR PRESENTATION:  Evaluate the patient with coronary disease.   HISTORY OF PRESENT ILLNESS:  The patient is 70 years old.  He presents  for yearly followup.  He has coronary disease as described.  Since I  last saw him, he has had total right shoulder surgery and replacement.  He apparently had no problems with this.  Unfortunately, he stopped  taking all of his medicines because of cost.  I am not sure that he has  been back to see his primary care physician recently.  He continues to  smoke some cigars.  He says he does get some chest discomfort  occasionally only when he lies flat at night.  It lasts for only a few  seconds.  It is an aching discomfort a little bit similar to his  previous angina.  However, he is taken to doing such activities as  pushing or walking behind the self-propelled lawnmower, and doing some  vigorous yard work.  With this activity, he can bring on no chest  discomfort, no neck or arm discomfort.  He has not noticed any  palpitations.  He has had no presyncope or syncope.  He has no PND or  orthopnea.   PAST MEDICAL HISTORY:  1. Coronary artery disease (status post myocardial infarction June 21, 2005 secondary to distal occlusion of diagonal treated      medically, 50% LAD stenosis), mildly reduced ejection fraction (45%      by echo February 2007).  2. History of a disseminated MRSA.  3. Diabetes mellitus.  4. Dyslipidemia.  5. Right shoulder replacement.   ALLERGIES/INTOLERANCES:  The patient had a cough with one of the ACE  INHIBITORS.   MEDICATIONS:  Currently, none.   REVIEW OF SYSTEMS:  As stated in the HPI and otherwise negative for  other  systems.   PHYSICAL EXAMINATION:  GENERAL:  The patient is in no distress.  VITAL SIGNS:  Blood pressure 136/88, heart rate 51 and regular.  Weight  184 pounds.  HEENT:  Eyelids unremarkable, pupils equal round and reactive to light,  fundi visualized, oral mucosa unremarkable.  NECK:  No jugular venous distention at 45 degrees, carotid upstroke  brisk and symmetrical, no bruits, no thyromegaly.  LYMPHATICS:  No cervical, axillary, or inguinal adenopathy.  LUNGS:  Clear to auscultation bilaterally.  BACK:  No costovertebral mass.  CHEST:  Unremarkable.  HEART:  PMI not displaced or sustained, S1 and S2 within normal limits,  no S3, no S4, no clicks, no rubs, no murmurs.  ABDOMEN:  Flat, positive bowel sounds, normal in frequency and pitch, no  bruits, no rebound, no guarding, no midline pulsatile mass, no  hepatomegaly, no splenomegaly.  SKIN:  No rashes, no nodules.  EXTREMITIES:  Pulses 2+ throughout, no cyanosis, no clubbing, no edema.  NEURO:  Oriented to person, place, and time, cranial nerves II-XII  grossly intact, motor grossly intact throughout.   EKG, sinus bradycardia, rate 51, axis within normal limits, intervals  within normal limits, no acute ST-T wave change.   ASSESSMENT/PLAN:  1. Coronary disease.  The patient is having no symptoms that are      consistent with angina.  He has some atypical resting chest      discomfort.  At this point, I do not think further cardiovascular      testing is suggested.  However, he needs continued risk reduction.      Unfortunately, he is not taking any of his medications.  I      convinced him that he can afford some of his 4-dollar drugs that he      should get up cigars in order to pay for this.  He will restart his      aspirin.  I am going to avoid a beta-blocker because of his      bradycardia.  Because of his coronary disease and his mildly      reduced ejection fraction, I am going to try to restart 5 mg of      ramipril.   He had a cough possibly related to his ACE in the past.      We will see if this recurs and then we would need to stop it.  He      would not be able to afford an ARB at this point.  2. Dyslipidemia.  I have convinced him to try Zocor 40 mg daily.  He      has been given written instructions to come back and get a lipid      profile and liver enzymes done in 8 weeks.  He understands the      importance of this.  3. Diabetes.  I am not going to restart his diabetes medication, but      impressed upon him the importance of getting into see Dr. Wynelle Link to      have this followed up.  4. Tobacco.  Again, he needs to stop smoking cigars and we talked      about this.  5. Followup.  I will see him in 1 year or sooner if needed.     Rollene Rotunda, MD, [facility]  Electronically Signed    JH/MedQ  DD: 11/29/2007  DT: 11/30/2007  Job #: [id number]   cc:   Deatra Kyal, M.D.

## 2010-09-08 NOTE — Op Note (Signed)
NAME:  [name], [name]                 ACCOUNT NO.:  [id number]   MEDICAL RECORD NO.:  [id number]          PATIENT TYPE:  AMB   LOCATION:  SDS                          FACILITY:  [facility]   PHYSICIAN:  Mark C. Ophelia Charter, M.D.    DATE OF BIRTH:  June 11, 1940   DATE OF PROCEDURE:  08/22/2006  DATE OF DISCHARGE:                               OPERATIVE REPORT   PREOPERATIVE DIAGNOSES:  1. Right shoulder osteoarthritis.  2. Past history of septicemia with spontaneous MRSA abscesses and      septic right shoulder joint.   POSTOPERATIVE DIAGNOSES:  1. Right shoulder osteoarthritis.  2. Past history of septicemia with spontaneous MRSA abscesses and      septic right shoulder joint.   HISTORY:  This is a 70 year old male status post past history of MRSA  infection with bacteremia sepsis and spontaneous multiple abscesses  presents for a shoulder arthroscopy.  He has had previous workup  including sed rate, CRP, aspiration under fluoroscopy, CBC with diff  with no evidence of residual infection.  He is a diabetic who has been  noncompliant.  Past surgery last week was canceled when showed up after  not taking his diabetic meds and had eaten and drank two meals  preoperatively.   PROCEDURE:  After the induction of general anesthesia with the patient  in the beach chair position preoperative vancomycin was given.  Standard  prepped and draped was performed.  The old anterior incision was noted  along the biceps tendon sheath, which was 6 to 8 cm in length, as well  as the posterior shoulder incision from his previous abscess procedure  in February 2007.  The arthroscope was introduced from a posterior  approach, the shoulder was dry, there was no evidence of purulence.  Anterior portal was made at the long rod, inferior to biceps tendon  region.  There is chronic synovitis changes.  No evidence of acute  infection.  Multiple loose pieces were floating in the shoulder joint.  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  There was some areas where  there was portion of remaining cartilage with grade 3 changes, but most  areas had grade 4 changes.  In the glenoid there was grade 4 changes,  degenerative labral tear, which was debrided.  Biceps anchor was intact.  No SLAP tear was present.  No subluxation, negative __sulcus sign______.  Multiple loose pieces inaxillary __recess______ were debrided and  removed.  The shaver was switched to the posterior portal, scope in the  front and then continued debridement removing loose pieces, as well as  performing a chondroplasty with flexion and extension of the shoulder,  internal and external rotation to deliver the portion of the head that  needed smoothing to the shaver.  The shoulder was thoroughly irrigated,  aspirated to make sure any remaining loose pieces were removed.  Nylon  sutures were placed in the portals, 4 x 4's after Marcaine infiltration  12 mL, 10 in the joint and one more in each  portal area and ABDs taped  and a sling.  The patient tolerated the procedure well, transferred to  the recovery room in stable condition.      Mark C. Ophelia Charter, M.D.  Electronically Signed    MCY/MEDQ  D:  08/22/2006  T:  08/22/2006  Job:  [id number]

## 2010-09-11 ENCOUNTER — Other Ambulatory Visit: Payer: Self-pay | Admitting: *Deleted

## 2010-09-11 MED ORDER — CLOPIDOGREL BISULFATE 75 MG PO TABS: 75.0000 mg | ORAL_TABLET | Freq: Every day | ORAL | Status: DC

## 2010-09-11 NOTE — H&P (Signed)
NAME:  [name], [name] NO.:  [id number]   MEDICAL RECORD NO.:  [id number]          PATIENT TYPE:  EMS   LOCATION:  MAJO                         FACILITY:  [facility]   PHYSICIAN:  Theone Stanley, MD   DATE OF BIRTH:  1941-04-01   DATE OF ADMISSION:  04/23/2005  DATE OF DISCHARGE:                                HISTORY & PHYSICAL   CHIEF COMPLAINT:  Chest pain.   HISTORY OF PRESENT ILLNESS:  [name] is a very pleasant 70 year old  African-American gentleman who states he woke up with some midback pain, sat  up, and then subsequently felt a pain, pressure in his substernal area,  mainly on the left side.  His mouth then became dry and then he had some  radiation of his arm.  The pain in his back went away.  At that point in  time he was trying to drive himself to the hospital; however, he said he got  confused.  He felt he had missed an exit to the hospital.  He saw a  policeman and the policeman called EMS, and they brought him here to [facility] ER.  Currently the patient states he has some type of fluttering in his  chest.  He does not specifically say it is a chest pain pressure currently.  No nausea or vomiting.  Positive diaphoresis.   PAST MEDICAL HISTORY:  None.   MEDICATIONS:  None.   ALLERGIES:  None.   FAMILY HISTORY:  None.   SOCIAL HISTORY:  The patient lives in [place], is single, has one child.  Smokes approximately five cigars for the past 10 years.  He occasionally  drinks alcohol.  No illicit drug use.   REVIEW OF SYSTEMS:  Prior to this the patient states that he is in excellent  health and had no other problems.   PHYSICAL EXAMINATION:  GENERAL:  This is a 70 year old African-American  gentleman lying on the gurney in no acute distress.  VITAL SIGNS:  Temperature 98.4, blood pressure 149/94, pulse of 68,  respiratory rate of 18, saturating 96% on room air.  HEENT:  Head atraumatic, normocephalic.  Eyes 3 mm.  Pupils equal and react  to light.  Extraocular movements intact.  Ears:  No discharge.  Throat  clear.  NECK:  Supple.  CARDIAC:  Regular rate and rhythm, no murmur, rubs or gallops heard.  CHEST:  Lungs clear to auscultation bilaterally.  ABDOMEN:  Soft, nontender, nondistended.  EXTREMITIES:  No edema, cyanosis or clubbing.  NEUROLOGIC:  The patient is alert and oriented x3, nonfocal.  GENITOURINARY:  Deferred.   LABS, RADIOLOGY:  Chest x-ray was negative.  EKG was normal sinus rhythm  with no acute changes.  Sodium 137, potassium 4.6, chloride 107, BUN of 16,  creatinine at 1.5.  Cardiac markers x2 were negative.  White count of 5.5,  hemoglobin 14, hematocrit 40, platelets at 206.   ASSESSMENT AND PLAN:  1.  Atypical chest pain.  Risk factors include his age, his gender, he      smokes cigars.  It is unclear  what his cholesterol is at this time since      he has no primary care physician and has not checked this.  His story      is, however, concerning.  He received an aspirin.  Will place on      telemetry, continue to check cardiac markers.  He initially was no      nitroglycerin drip.  I will discontinue and put him on nitroglycerin      paste.  Change him over to Lovenox.  Continue daily aspirin.  [facility] was contacted initially; however, they requested that the      hospitalist admit the patient and they will consult.  This will need to      be followed up on so that cardiology can see the patient if deemed      necessary.  2.  Miscellaneous.  The patient has no primary care physician and has not      had any preventive interventions.  I talked to the patient about the      fact that he does need a primary care physician, especially at his age.      He has not had a colonoscopy or had his prostate checked.      Theone Stanley, MD  Electronically Signed     AEJ/MEDQ  D:  04/23/2005  T:  04/23/2005  Job:  [id number]

## 2010-09-11 NOTE — Discharge Summary (Signed)
NAME:  [name], [name] NO.:  [id number]   MEDICAL RECORD NO.:  [id number]          PATIENT TYPE:  INP   LOCATION:  5013                         FACILITY:  [facility]   PHYSICIAN:  Melissa L. Ladona Ridgel, MD  DATE OF BIRTH:  [birth year]   DATE OF ADMISSION:  05/11/2005  DATE OF DISCHARGE:  05/21/2005                                 DISCHARGE SUMMARY   CHIEF COMPLAINT:  Hand swelling and pain.   DISCHARGE DIAGNOSES:  1.  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  He will be treated with a total of 28 days of      vancomycin followed by doxycycline 100 mg b.i.d. for two months.  Follow-      up cultures should be obtained after removal of the PICC line and the      completion of the doxycycline, two sets every two weeks x2 months.  Dr.      Burnice Logan of the infectious disease department at [facility] will make himself available for any outpatient followup, but at      this time, the patient can follow up with his primary care physician for      the prescribed treatment.  The source of this infection is felt to be      secondary to a possible phlebitis related to IV insertion during his      last admission.  2.  Mildly elevated liver enzymes:  The patient's hepatitis B and C have      been within normal limits.  His HIV was also negative.  There is no      obvious source for intrinsic liver disease.  I have therefore asked his      primary care physician to please trend these numbers and decide on      whether to continue medications that may be causing elevations, namely      his oral hypoglycemics.  For now, I have discontinued his Zocor and will      request that be continued as soon as the  patient is stable to do so.  3.  Mild alterations in mental status:  During the course of the hospital      stay, the patient was found to be slightly paranoid and confused at      times.  He responded favorably to Haldol and adjustment of his pain      medication.  He had no further events during the course of my care for      him; however, he does have a few idiosyncrasies suggestive of some      cognitive deficits; however, he does have the capacity to make decisions      for himself; therefore, I have not elected to have any  further workup on      this patient.  Please note that the patient's sister did call with      concerns about him living alone; however, the patient had the capacity      to tell me that I was not able to speak to her; therefore, I have not      shared any information with her.  4.  Diabetes:  The patient's blood sugars have been moderately controlled on      his Amaryl 2 mg.  I therefore have requested his primary care physician      follow up with the patient and make recommendations for further care.      Should his liver enzymes continue to increase, I would suggest that the      Amaryl be discontinued, and the patient need Lantus and other insulin      products.  5.  Hypercholesterolemia:  As stated, I have held the patient's Zocor until      we can gain control of his muscle pain.  6.  Chronic renal insufficiency:  The patient's creatinine has remained      relatively stable during the full course of the hospital stay and we      will request weekly labs to continue to evaluate this.  7.  Anemia:  The patient during the course of the hospital stay had iron,      possibly suggestive of chronic illness; however, occult losses cannot be      ruled out.  Patient has provided [place] with one fecal occult blood testing      which was negative.  I will hold his aspirin for now, since he has no      definable and request his primary care physician perform fecal occult       blood testing and arrange for colonoscopy when he is more recovered, to      evaluate his GI tract for occult losses.  8.  Wound care:  Please note that we have faxed a request over to the wound      center for the patient regarding initial evaluation for hydrotherapy and      dressing changes.  Dr. Ophelia Charter has recommended three times therapy to the      right biceps with changing of the packing.  The patient's daily      dressings merely need to be a dry dressing to deal with the drainage,      which we expect and welcome.   DISCHARGE MEDICATIONS:  1.  Vancomycin 1 gm IV q.12h., day #11 of 28.  This should be followed by      iron 325 mg twice daily, Amaryl 2 mg once daily.  Aspirin 81 mg will be      held until restarted.  This is the sustained release form, and oxycodone      20 mg every 4 hours, which is the short-acting form.  Initially, I had      felt that the patient may benefit from Ultram as well, but at this time,      he seems to be tolerating the oxycodone and OxyContin with minimal pain.      Please note his pain is still not completely controlled, but at this      time, in light of his compliance issues, I would like to have him follow      up with his primary care physician to see, if added, if they  are to be      taken at bedtime.  I have given him a limited supply of five tablets.      This can be refilled by his primary care physician if appropriate.   FOLLOW-UP APPOINTMENTS:  1.  Patient will be instructed to follow up with Dr. Wynelle Link, phone number [phone]      [phone], on May 25, 2005 at 8:15 a.m.  2.  The patient has been provided with home health to help with his      vancomycin and blood levels as well as dressing changes.  The patient      has been given instructions, if he develops any fever, chills, worsening      pain, he is to return.  3.  Follow-up appointment with Dr. Ophelia Charter in 10 days, which would be      approximately February.  HISTORY OF PRESENT ILLNESS:   Disseminated methicillin-resistant  staphylococcus aureus, affecting the right biceps, the right shoulder, and  the left wrist and hand.  Please note the patient returned to the hospital  after a recent hospital stay for workup of chest pain, during which time he  had a cardiac catheterization.  Subsequent to his discharge from that  admission, the patient developed pain in his left wrist and also migratory  pain.  Subsequently, he developed whole arm pain involving the left wrist,  right shoulder, and right arm with swelling.  Came to the emergency room for  further evaluation.  The patient evidently received OxyContin, prednisone,  and indomethacin as an outpatient, but it is unclear where these medications  were prescribed from.  The patient was admitted to the general medical  floor.  He was evaluated for systemic infection involving multiple sites.  He was placed on IV vancomycin as well as Unasyn.  A Doppler ultrasound was  obtained to rule out DVT, in light of his recent vascular accesses, which  showed no obvious evidence for DVT or superficial thrombus.  It was a  technically difficult study because of severe swelling and pain.  Upon  receiving this information, the patient was then referred for orthopedic  consultation by Dr. Annell Greening.  Laboratories were sent off to rule out  connective tissue versus vasculitis, namely a TSH, C-reactive protein, ANA,  and rheumatoid factor, CPK, PT/PTT were also obtained.  His Zocor was held  secondary to his severe muscle pain.  He was continued on his oral  hypoglycemic medication for diabetes.   Dr. Annell Greening came to evaluate the patient for polyarticular symptoms.  Recommendation was for aspiration of the right shoulder under fluoroscopy.  He was kept n.p.o. until the Gram's stain was done and initially,  recommendations for rheumatology consult were obtained.  The patient  continued to have severe pain and swelling.  He was found to have  an  elevated ESR of 80, elevated white count of 19.  An MRI was attempted to be  obtained; however, the patient was under severe pain and was unable to  tolerate it.  On January 18, aspiration results came back showing Gram  positive cocci's in pairs, and 2/2 of his cultures also had gram positive  cocci.  He was diagnosed with bacteremia with possible disseminated joint  involvement.  It was discussed with Dr. Ophelia Charter, who stated that he would  evaluate the patient further for infected joint.  A 2D echo was completed to  evaluate for vegetations and infection.  This showed left ventricular  systolic  function with hyperdynamic.  The EF was 65-75%.  There was no  ventricular wall motion abnormalities.  There were features consistent with  pseudonormal left ventricular filling pattern and concomitant abnormal  relaxation.  There is no mention of any vegetations.  It was also mentioned  of trivial tricuspid valvular regurgitation.  Infectious disease was consulted on January 18th for the finding of Staphylococcus aureus  bacteremia with septic arthritis.  The patient was continued on vancomycin,  and the source was felt to be secondary to possible peripheral IV phlebitis  during his last admission.  A designation of four weeks of IV antibiotic  therapy was recommended/anticipated, pending further information regarding  the sensitivities.  On January 18, the patient actually was taken to the OR  for irrigation and debridement of the right biceps area, which was found to  be abscessed.  On MRI, which was able to be obtained, the MRI showed soft  tissue abscesses of all muscles of the rotator cuff, biceps brachia, and  possible brachialis.  There was no evidence for osteomyelitis at that time.  Dr. Annell Greening performed the surgery, I&D'd the right infraspinatus abscess  with aspiration of the left hand, MCP, and wrist.  Penrose drains were  placed in the right biceps area to facilitate drainage.   The wound is packed  and will need to be further repacked three times daily.   Post surgically, the patient progressed slowly, but ultimately, we were able  to obtain pain control, OxyContin, and oxycodone.  He continued on his IV  antibiotics.  As stated, the patient was noted to have a decrease in his  hemoglobin down to a level of 7.9, which was transfused.  Occult fecal blood  testing was negative.  We will request an outpatient workup for possible  occult losses, namely fecal occult blood test.   As stated in the discharge diagnoses, the patient was worked up Hepatitis.  Hepatitis B and C panels have been negative.  His HIV was negative.  We have  discontinued his Zocor and will request serial monitoring of his CMET.   PHYSICAL EXAMINATION:  VITAL SIGNS:  On the day of discharge, the patient  appeared quite functional.  He has been up for the past two days.  His vital  signs reveal a T max of 98, blood pressure 114/72, pulse 64, respirations  18.  His blood sugars have ranged from 91 to 106.  GENERAL:  This is a well-developed and well-nourished African-American male  in mild distress secondary to persistent pain in his right shoulder.  NECK:  There are no carotid bruits.  EXTREMITIES:  His right shoulder has an incised and drained area of tissue,  which is treated with a dry dressing.  His right forearm, he has an  approximately 7-8 inch long incision with staples intact.  The area is  opened.  The wound is draining.  There is packing in the wound.  His left  hand remains with slight erythema and tenderness in the wrist as well as  some edema in the hand, but this is much improved.  NEUROLOGIC:  The patient is awake, alert and oriented.  Cranial nerves II-  XII are intact.  Power in the lower extremities is 5/5.  Upper extremities  are limited by pain, but he does have good pulses.  Good capillary refill.  Good grip strength.   Pertinent laboratory values during the course of  this hospital stay reveal a hemoglobin at discharge of  10.7 and hematocrit of 30.5 with platelets of  501.  His white count is 5.2.  His sodium is 138, potassium 4, chloride 103,  CO2 26, glucose 81, BUN 11, creatinine 1.4, calcium 8.2.  His total iron-  binding capacity was 146, which is low.  His total iron was 11, which is  low.  His percent saturation is 8, which is low.  His ferritin is expectedly  high at 571.  Fecal occult testing x1 was negative.  His last C-reactive  protein was 137.7 with an ESR of 86.  His viral load was less than 50.  His  HIV, RNA, ultraquantitative was less than 1.70, both within normal limits.  Last vancomycin level was on January 24th, and the level was 16.1.  Hepatitis C levels were negative.  Hepatitis surface antigen was negative.  Hepatitis B core antigen was negative.  Antibody is negative.  As stated,  fluid cultures were positive for methicillin-resistant staphylococcus  aureus.  His drugs of abuse screen was positive for opiates, which the  patient was receiving from a medical standpoint.   At this time, the patient is deemed stable for followup at outpatient IV  antibiotics and close followup by his primary care practitioner and  orthopedist.  Appointments have been made with Dr. Evert Kohl, and I will  attempt to make an appointment with Dr. Ophelia Charter.  The patient will also  attempt to have an appointment made at the outpatient wound care center for  hydrotherapy and dressing changes.      Melissa L. Ladona Ridgel, MD  Electronically Signed     MLT/MEDQ  D:  05/21/2005  T:  05/21/2005  Job:  [id number]   cc:   Deatra Leviathan, M.D.  Fax: [phone]   Veverly Fells. Ophelia Charter, M.D.  Fax: [phone]   Rockey Situ. Flavia Shipper., M.D.  Fax: [phone]

## 2010-09-11 NOTE — Cardiovascular Report (Signed)
NAME:  EIN, RIJO NO.:  [id number]   MEDICAL RECORD NO.:  [id number]          PATIENT TYPE:  INP   LOCATION:  3731                         FACILITY:  [facility]   PHYSICIAN:  Charlies Constable, M.D. [facility] DATE OF BIRTH:  14-Nov-1940   DATE OF PROCEDURE:  04/27/2005  DATE OF DISCHARGE:                              CARDIAC CATHETERIZATION   CLINICAL HISTORY:  Mr. Massman is 70 years old and was admitted with chest  pain that was somewhat atypical but also somewhat worrisome for ischemia. He  ruled out for a myocardial infarction and was scheduled for evaluation with  angiography today.   PROCEDURE:  The procedure was performed via the right femoral artery using  an arterial sheath and #6 French triple-lumen coronary catheters. A  __________  contrast was used. The right femoral artery was closed with  Angio-Seal at the end of the procedure. The patient tolerated the procedure  well and left the laboratory in satisfactory condition.   RESULTS:  The left main coronary artery was free of disease.   The left anterior descending artery gave out to a large septal perforator  and 2 diagonal branches. There was 40% narrowing in the proximal LAD and 30%  narrowing in the mid LAD.   The circumflex artery: The circumflex artery was a moderate size vessel that  gave off to a ramus branch, a large marginal branch and 2 posterolateral  branches. These vessels were free of significant disease.   The right coronary artery: The right coronary artery is a moderate size  vessel that gave off to a conus branch, a right ventricular branch, a  posterior descending branch and 2 posterolateral branches. This vessel was  free of disease although a slow flow requiring 4 cycles to completely fill  the vessel. This was classified as TIMI-2 flow.   The left ventriculogram __________  performed in the ROA projection showed  good wall motion with no areas of hypokinesis. The estimated ejection  fraction was 60%.   The aortic pressure was 125/57 with a mean of 80 and left ventricular  pressure was 125/22.   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 No significant obstruction in the circumflex artery, no significant  obstruction in the right coronary artery but TIMI-2 flow and normal LV  function.   RECOMMENDATIONS:  The patient has mild non-obstructing disease. The  significance of the slow flow in the right artery is not clear. His symptoms  are not typical for microvascular angina. Will plan reassurance and  continued risk factor modification.           ______________________________  Charlies Constable, M.D. [facility]     BB/MEDQ  D:  04/27/2005  T:  04/27/2005  Job:  [id number]   cc:   Lianne Bushy, M.D.  Fax: [phone]   Charlies Constable, M.D. [facility]  [address]  [address]  [place]  [place] [address]   Theone Murdoch, M.D.

## 2010-09-11 NOTE — Assessment & Plan Note (Signed)
[facility]                             PULMONARY OFFICE NOTE   NAME:[name], [name]                          MRN:          [id number]  DATE:04/28/2006                            DOB:          31-Jul-1940    HISTORY:  A 70 year old black male cigar smoker in for followup  evaluation of chronic cough and dyspnea that is much better since  stopping Altace.  He continues to, however, feel the urge to clear his  throat and in retrospect may have been a bit better while still taking  Zegerid, which was used just in the event that the coughing was inducing  secondary reflux.   The patient states his cough is completely better but he continues to  have trouble with throat-clearing, especially during the day with the  use of voice.  He denies any excessive sputum production, nocturnal  respiratory complaints, fevers, chills, sweats or significant dyspnea,  orthopnea, PND or leg swelling.   MEDICATIONS:  Reviewed with the patient in detail, inventoried on the  face sheet column dated April 28, 2006; significant for the fact that  the patient is still taking fish oil.   PHYSICAL EXAMINATION:  GENERAL:  He is still clearing his throat  frequently.  He is afebrile with normal vital signs and looks great.  HEENT:  Unremarkable and oropharynx is clear with no evidence of  excessive postnasal drainage or cobblestoning.  NECK:  Supple without cervical adenopathy or tenderness.  Trachea is  midline, no thyromegaly.  LUNG FIELDS:  Clear bilateral to auscultation and percussion.  HEART:  Regular rhythm without murmur, gallop or rub.  ABDOMEN:  Soft, benign.  EXTREMITIES:  Warm without calf tenderness, cyanosis, clubbing, or  edema.   PFTs were reviewed with the patient from today and are essentially  normal.   IMPRESSION:  1. No evidence of chronic obstructive pulmonary disease.  2. Cough is largely resolved off of ACE inhibitors but not completely      and  throat-clearing is still present.  This may indicate primary      reflux may be an issue and to cover this possibility I recommend      omeprazole 20 mg q.a.m. plus dietary restrictions for the next 3      months and follow up here at the end of 3 months to determine      whether additional steps are needed.  3. Hypertension is well controlled on Benicar 20 mg one daily in the      place of Altace.  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  4. I have asked the patient also to eliminate oil from his diet since      oil and acid can potentially cause the same irritability of the      upper and lower airways.     Charlaine Dalton. Sherene Sires, MD,  FCCP  Electronically Signed    MBW/MedQ  DD: 04/28/2006  DT: 04/28/2006  Job #: [id number]   cc:   Deatra Martese, M.D.

## 2010-09-11 NOTE — Consult Note (Signed)
NAME:  [name], [name] NO.:  [id number]   MEDICAL RECORD NO.:  [id number]           PATIENT TYPE:   LOCATION:                                 FACILITY:   PHYSICIAN:  Jonelle Sports. Sevier, M.D.      DATE OF BIRTH:   DATE OF CONSULTATION:  05/31/2005  DATE OF DISCHARGE:                                   CONSULTATION   HISTORY:  This 70 year old black male is seen courtesy of Dr. Ladona Ridgel for  evaluation of a wound of the right upper extremity and consideration for  hydrotherapy.   The patient's history began back in early January when he noticed pain and  swelling in the right hand and also the upper aspect of the right arm. He  eventually consulted a physician who felt that he likely had infection with  some septic process and hospitalized him appropriately. During that  hospitalization he had attempts at aspiration of the hand but no infection  was demonstrated there. On the other hand, apparently a contrast study  showed that he had a deep abscess in the biceps of that right upper  extremity and in fact with bacteremic. It is not clear whether the  bacteremia proceeded the abscess or vice versa. Whatever, he was begun  appropriately on intravenous vancomycin with the placement of a PICC line  and also had surgical drainage of the abscess in the biceps area.   It is the wound from that surgery which is now at issue, and apparently  there was some concern that he needed our attentions and/or possibly  hydrotherapy.   The patient mentions that the wound has not drained but that he does have a  great deal of pain in that arm particularly with attempts at motion.   PAST MEDICAL HISTORY:  Notable for type 2 diabetes for which he is currently  on no medication which presumably is not in very good control. He also has  dyslipidemia characterized predominantly by high triglycerides and low HDL  and also mild chronic renal insufficiency with a creatinine in the hospital  around levels of 1.7 or above. During hospitalization he also manifests an  abnormality in liver function tests and HIV, hepatitis B and C testing was  done and both were negative.   ALLERGIES:  He has no known drug allergies.   CURRENT MEDICATIONS:  His current medications include his vancomycin by a  PICC line, OxyContin 20 mg b.i.d. and OxyIR five or 10 mg every 4 hours as  needed which he says is quite regularly. He also takes a baby aspirin daily.  He apparently is not currently on his Amaryl for diabetes or his Zocor for  his lipid disorder.   EXAMINATION:  Examination today is limited to the left upper extremity.  There is a linear well-healed wound lying over the body of the biceps muscle  which measures approximately 12 cm in length. It is still has staples in  place although these are attempting to extrude themselves. The wound itself  appears sealed with the exception of one small area  which is a bit crusty  but not actively infected in appearance and not problematic. There is  palpable induration in the subcutaneum of this  area but nothing that feels  fluctuant. There is no warmth to the tissue in this area.   The patient also has a small healed surgical incision site at the area just  below the scapular, the spine on the right side again with no evidence of  active infection at this time.   The hand is no longer swollen and he has full motion there.   He does appear to have some degree of limitation in motion of the shoulder  particularly with abduction and flexion and extension maneuvers. This is not  evaluated or quantitated in any detail.   IMPRESSION:  1.  History of staphylococcal abscess with bacteremia, drained and currently      under a chronic antibiotic therapy apparently with good response.  2.  Satisfactorily healing wound of the right upper extremity.  3.  Loss of function and possible capsulitis of the right shoulder.  4.  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   DISPOSITION:  1.  The wound is carefully evaluated and it does not appear to me that any      specific  wound treatment is needed at this point. The small area that      is not yet healed in the laceration of the bicipital area should do so      on its own simply by been kept to clean and dry. The stables will likely      be removed from this wound by Dr. Ophelia Charter when he sees the patient 48      hours hence.  2.  Certainly the antibiotic therapy which I understand include      approximately 14 additional days of intravenous vancomycin and then 2      months of oral doxycycline should be continued to completion.  3.  There certainly is no need for hydrotherapy debridement of the wound at      this point and any use of hydrotherapy otherwise would be on a whirlpool      bath type in association with attempts to rehabilitate the right      shoulder.  4.  It would be my recommendation that the primary physician and/or the      orthopedist refer him to physical therapy when they feel the timing is      appropriate from the point of view of healing and also point view of      antibiosis so that he may begin rehabilitation of the shoulder. The      patient says he has insurance and certainly access to a private the      physical therapist to can use that form of therapy would seem to be      possible.  5.  Clearly, the patient has a strong dependency on his on pain medications      at this point and may well unfortunately evolved into a chronic pain      syndrome. In that regard, we will leave his pain medicine management to      his primary physician and also suggest that at some point a pain center      referral might be indicated.  6.  No return appointment is given to the patient for this facility.           ______________________________  Jonelle Sports.  Cheryll Cockayne, M.D.    RES/MEDQ  D:  05/31/2005  T:  05/31/2005  Job:  [id number]   cc:   Carolanne Grumbling, M.D.   Veverly Fells.  Ophelia Charter, M.D.  Fax: [phone]

## 2010-09-11 NOTE — Cardiovascular Report (Signed)
NAME:  [name], [name] NO.:  [id number]   MEDICAL RECORD NO.:  [id number]          PATIENT TYPE:  INP   LOCATION:  2914                         FACILITY:  [facility]   PHYSICIAN:  Salvadore Farber, M.D. LHCDATE OF BIRTH:  [birth year]   DATE OF PROCEDURE:  06/21/2005  DATE OF DISCHARGE:                              CARDIAC CATHETERIZATION   PROCEDURE:  Left heart catheterization, left ventriculography, coronary  angiography.   INDICATIONS:  Mr. Viverette is a 70 year old gentleman with known moderate  coronary disease by cardiac catheterization April 27, 2005. He has had MRSA  sepsis in the interim. He has been recovering from this. At home this  morning at 9:40 a.m., he had the abrupt onset of severe substernal chest  discomfort and some mild associated dyspnea. He presented promptly to the  emergency room where initial electrocardiogram was done at 10:36 a.m. The  patient was seen and evaluated by Dr. Adriana Simas. The patient describes that he  had a 7/10 ongoing severe chest discomfort throughout the subsequent hours  while he remained in the emergency room. Electrocardiogram demonstrated both  anterior and inferior ST elevations which were new compared with  electrocardiogram of approximately six weeks ago. At approximately 2:30  p.m., cardiology was consulted after troponin returned elevated at 0.22. He  was seen by Dr. Eden Emms and referred for cardiac catheterization. I was  apprised of the situation. Another patient was taken off the cardiac  catheterization table to facilitate urgent angiography for Mr. Koppelman given  his ongoing chest discomfort and ST elevations.   PROCEDURAL TECHNIQUE:  Informed consent was obtained. Under 1% lidocaine  local anesthesia, a 6-French sheath was placed in the right common femoral  artery using modified Seldinger technique. Diagnostic angiography and  ventriculography were performed using JL-4, JR-4, and pigtail catheters.  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 Due to the small territory supplied by this vessel and the  approximately 1 mm caliber of the vessel, I elected to manage it  conservatively with aspirin and beta-blocker. He was then transferred to the  cardiac intensive care unit in stable condition. Sheath will be removed  there.   COMPLICATIONS:  None.   FINDINGS:  1.  LV: 164/10/17. EF 65% with apical akinesis.  2.  No aortic stenosis or mitral regurgitation.  3.  Left main: Angiographically normal.  4.  LAD: Moderate-sized vessel giving rise to a very large septal      perforator. There is a 50% stenosis of the midvessel. It gives rise to      one moderate-sized diagonal. The most apical portion of the vessel was      occluded, new compared with [place], 2007.  5.  Circumflex: Moderate-sized vessel giving rise to a single obtuse      marginal. It is angiographically normal.  6.  RCA: Moderate-sized dominant vessel. It is angiographically normal.   IMPRESSION/PLAN:  The patient has a new occlusion of the distal LAD. 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 Since the territory and  vessel  are quite small, will manage medically. With his recent sepsis, we will need  to exclude endocarditis with secondary coronary embolism as a possible  etiology for his myocardial infarction.      Salvadore Farber, M.D. [facility]  Electronically Signed     WED/MEDQ  D:  06/21/2005  T:  06/22/2005  Job:  [id number]   cc:   Rollene Rotunda, M.D.  [address]  [address]  [place]  [place] [address]   Deatra Treyshawn, M.D.  Fax: [phone]   Donnetta Hutching, MD  [address] [place], [place] [address]

## 2010-09-11 NOTE — Op Note (Signed)
NAME:  [name], [name] NO.:  [id number]   MEDICAL RECORD NO.:  [id number]          PATIENT TYPE:  INP   LOCATION:  5013                         FACILITY:  [facility]   PHYSICIAN:  Mark C. Ophelia Charter, M.D.    DATE OF BIRTH:  1941-04-01   DATE OF PROCEDURE:  05/13/2005  DATE OF DISCHARGE:                                 OPERATIVE REPORT   REFERRING PHYSICIAN:  Corinna L. Lendell Caprice, MD   PREOPERATIVE DIAGNOSIS:  Staphylococcal bacteriemia with abscess, right  biceps, question of abscess, left wrist or hand, and infraspinatus abscess.   POSTOPERATIVE DIAGNOSES:  1.  Right biceps abscess.  2.  Right infraspinatus abscess.  3.  Left hand edema.   HISTORY OF PRESENT ILLNESS:  This 70 year old male presented to the hospital  to Dr. Pincus Sanes service with the history of severe pain in his shoulder  and hand with the new onset of diabetes and hyperlipidemia, and was seen in  the hospital recently, where he was worked up for chest pain which was  negative for cardiac problems.  He was seen in the emergency room 3 times in  the last several days complaining of migratory pain, initially in his left  wrist, with negative x-rays, pain in his arm with negative x-rays and the re-  presented with fever and swelling of his biceps.  Temperature was 100.9 on  admission.  He denies any IV drugs.  He does have type 2 diabetes, which is  of recent onset and is not treated, other than the Amaryl that he just  recently started.  He was on prednisone for a period of time.  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  I recommended aspiration of the right  shoulder under fluoroscopy and only 0.5 mL of fluid was found in the  shoulder and the patient had positive blood cultures at that time, but the  initial Gram's stain was negative.  Ultimately, culture grew out some Staph.  An MRI attempt was unsuccessful, due to  the patient being unable to hold  still.  Finally, as the patient was treated with antibiotics, white count  improved.  He was sent to the MRI scanner, still had a poor scan, but scan  did show evidence of right biceps abscess; initially, this was a CT and then  MRI scan was performed.  I scheduled the patient for surgery listed above  and as I had the patient in the holding area getting ready to go back,  radiologist called and said that it looked like there was abscess in the  biceps; it was unsure about the exact possible infection in the joint,  although there was not significant fluid.  The patient first had a CT scan  and then had an MRI, but the MRI was extremely poor quality due to the  patient being unable to hold still, despite medication with Ativan and IV  morphine.  Once I got back to the operating room with patient being prepped,  radiologist called  back and stated that there was an infraspinatus abscess  as well which, despite the poor quality, he was sure that it was present.   SURGEON:  Mark C. Ophelia Charter, M.D.   PROCEDURE:  After induction of general anesthesia and orotracheal  intubation, I had the patient have his arm pulled across his chest, exposing  the shoulder region, and I prepped the shoulder with Betadine, stabbed it  with a scalpel in the region described by the radiologist, 2 cm below,  involving the infraspinatus and then using a hemostat, poked through the  infraspinatus and immediately drained purulent material.  Pus was aspirated  and then irrigated with a bulb syringe and then a Penrose drain was packed  after the hemostat was placed in the vertical alignment as recommended by  the radiologist.  Once the drain was placed, the patient was allowed to fall  back in the regular supine position.  Standard U drape, DuraPrep and split  sheets were applied.  Based on the MRI findings, an anterolateral incision  was made, 10 cm.  Subcutaneous tissue was split, fascia  was entered and then  following along the lateral aspect of the biceps, the fingertip was poked in  and immediately purulent material was identified; aerobic and anaerobic  cultures were obtained, and then the abscess was evacuated.  Spreading was  performed; suction was placed down distally as well as proximally.  This  extended directly over the periosteum and was actually in the brachialis  muscle.  The biceps fascia was split, but there was no purulent material  present.  Biceps was squeezed.  Palpation in all directions was performed  and then 3 L of pulsatile irrigation were used.  Inspection of the muscle  after irrigation showed it was pink.  There was no necrotic debris that  needed to be removed and the capsule was curetted and then removed, repeat-  irrigated and Penrose drain placed and the skin loosely approximated.   As the dressing was being applied by the nurse, aspiration was performed of  the left hand MCP joint, dorsum of the hand, with only clear serous fluid  being expressed out through the hole with hand edema similar to anasarca.  After carefully prepping, I aspirated the wrist; there was no purulent  material present.  With the patient's history, I am sure there is some  source of purulence; it did not appear to be in mid-palmar space, thenar or  hypothenar bursa and was not present in the web space.  Edema area was  centered over the dorsal tenosynovium and since the patient was unable to  tolerate holding his stand still, we will have to delay exploration and  drainage for a likely abscess in this region until an adequate MRI scan can  be obtained of his hand.  The patient has received to antibiotics prescribed  by Dr. Lendell Caprice with improvement in white count initially from 19,000 down  to 12,000 with improvement in a left shift.  Radiologist also described a small area in the supraspinatus which was 1.1 cm and also an area in the  subscapularis between the chest  wall and the scapular bone, which was also  small, but there is no easy approach to this area.  Hand was cleaned off and  Band-Aid was applied.  We will put him in an arm elevator, sling and  continue IV antibiotics and repeat clinical exam.      Mark C. Ophelia Charter, M.D.  Electronically Signed  MCY/MEDQ  D:  05/13/2005  T:  05/14/2005  Job:  [id number]   cc:   Corinna L. Lendell Caprice, MD   Rockey Situ. Flavia Shipper., M.D.  Fax: [phone]

## 2010-09-11 NOTE — Assessment & Plan Note (Signed)
[facility]                               PULMONARY OFFICE NOTE   NAME:[name], [name]                          MRN:          [id number]  DATE:03/02/2006                            DOB:          08-22-1940    REASON FOR CONSULTATION:  Cough.   HISTORY:  This is a 70 year old black male with no history of prior  respiratory diseases, who two months ago abruptly developed a hacking, dry  cough that has been present day and night ever since.  He says he partially  responds to cough medications, but is constantly aware of the need to cough,  consisting of a tickle in the back of his throat.  He denies any excess  sinus or reflux symptoms, orthopnea, PND or leg swelling.  He does notice  mild chest discomfort anteriorly during severe coughing paroxysms that  radiate to his back.  He has no exertional pleuritic component to the pain,  however.   PAST MEDICAL HISTORY:  Significant for the absence of asthma or COPD.  He  does have a history of hypertension, has been maintained on chronic ACE  inhibitors, and has a history of diabetes and hyperlipidemia as well.  He  also has a history of staph infection of the right arm in February of 2007.   ALLERGIES:  None known.   MEDICATIONS:  Taken in detail on the work sheet column dated March 02, 2006, and do include Altace and Toprol.   SOCIAL HISTORY:  He continues to smoke cigars maybe once a day, no more.  He works as a Therapist, occupational.   FAMILY HISTORY:  Taken in detail on the work sheet and significant for the  absence of atopy or asthma.   REVIEW OF SYSTEMS:  Taken in detail on the work sheet and significant for  the lack of additional complaints.   PHYSICAL EXAMINATION:  This is a robust, pleasant, ambulatory black male who  clears his throat frequently during the exam, and has classic voice fatigue.  He is afebrile, stable vital signs with a blood pressure of 140/60.  HEENT:  Remarkably unremarkable.   That is, nasal turbinates normal,  oropharynx is clear with no evidence of excessive postnasal drainage or  cobblestoning, yet he has cleared his throat frequently during the interview  and exam.  The ear canals were clear bilaterally.  NECK:  Supple without cervical adenopathy or tenderness.  The trachea was  midline, no thyromegaly.  Lung fields are perfectly clear bilaterally to auscultation and percussion  with no cough elicited on inspiratory or expiratory maneuvers  There is a regular rhythm without murmur, gallop or rub present.  ABDOMEN:  Soft, benign.  EXTREMITIES:  Warm without calf tenderness, cyanosis, clubbing or edema.   Chest x-ray is reviewed from February 07, 2006, and is normal.   IMPRESSION:  Musculoskeletal chest discomfort.  The chest pain is most  likely musculoskeletal and secondary to coughing and/or reflux-induced  heartburn.  Trying to figure out whether the reflux is a primary or  secondary heartburn.  Trying to figure out  whether the reflux is a primary  or secondary event here is more problematic.   Classic upper airway cough that was abrupt in nature in a patient on chronic  angiotensin-converting enzyme inhibitors.  Although the Altace probably did  not cause the cough, whatever the cause of the cough is, Altace will  certainly perpetuate it via a cyclical mechanism, where the more he coughs,  the more he traumatizes the airway and releases bradykinin locally (the  clearance of which is blocked by angiotensin-converting enzyme inhibitors).   Typically this is triggered by either viral infection or reflux.  Based on  his body habitus, he is at risk of reflux and also has been using mint and  menthol lozenges, which I have asked him to stop.   I reviewed a gastroesophageal reflux disease diet with him, asked him to  stop Altace in the short run, replace it with Benicar 20 mg one daily (take  one-half if lightheaded when standing), and take Zegerid, but  only for 15  days to let the angiotensin-converting enzyme inhibitor wear off, and see  then if his cough relapses.   To control cyclical cough and musculoskeletal chest pain related to  coughing, I have recommended Delsym 2 teaspoons twice daily and tramadol 50  mg one every 4 as needed if he has breakthrough cough on Delsym.    ______________________________  Charlaine Dalton Sherene Sires, MD, [facility]    MBW/MedQ  DD: 03/02/2006  DT: 03/03/2006  Job #: [id number]   cc:   Deatra Eden, M.D.

## 2010-09-11 NOTE — Cardiovascular Report (Signed)
NAME:  [name], RADNEY NO.:  [id number]   MEDICAL RECORD NO.:  [id number]          PATIENT TYPE:  OBV   LOCATION:  3731                         FACILITY:  [facility]   PHYSICIAN:  Rollene Rotunda, M.D.   DATE OF BIRTH:  [birth year]   DATE OF PROCEDURE:  DATE OF DISCHARGE:                              CARDIAC CATHETERIZATION   CARDIOLOGIST:  Rollene Rotunda, M.D. (new).   REASON FOR CONSULTATION:  [name] is a 70 year old male, with no prior  cardiac history, and cardiac risk factors notable for a history of cigar  smoking and age, who presented to the emergency room early this morning with  new onset left-sided chest discomfort, associated with pain in his teeth, as  well as radiation down the left arm to the biceps.  The patient denied any  antecedent history of exertional chest discomfort or dyspnea.  He has,  however, been complaining of intermittent sweating over these past few  weeks, but in the absence of any fever or productive cough.   The patient awoke at approximately 11:30 p.m. last night with new onset  chest discomfort (5/10), which radiated up into the left arm down to the  biceps, and at which time he experienced a dry mouth and pain in his teeth.  There was no associated diaphoresis, dyspnea or nausea/vomiting.  He  presented to the emergency room via EMS and was treated with baby aspirin,  intravenous nitroglycerin and heparin.  Initial cardiac enzymes have been  negative, and serial electrocardiograms this morning show a normal sinus  rhythm and nonspecific ST abnormalities.   The patient is currently without any further chest discomfort or associated  symptoms.   ALLERGIES:  No known drug allergies.   MEDICATION PRIOR TO ADMISSION:  None.   PAST MEDICAL HISTORY:  The patient denies any hospitalizations and has had  no surgeries.   SOCIAL HISTORY:  The patient is a Therapist, occupational for [employer].  He lives  in [place].  He is separated.  He  has a daughter, who is a Printmaker in  college.  He smokes 4-5 cigars a day, but denies ever smoking cigarettes.  He drinks alcohol on occasion.   FAMILY HISTORY:  Mother deceased at age 44, complications from kidney  surgery.  Father deceased at age 62, complications of prostate cancer and a  history of strokes.  His siblings have no known heart disease.   REVIEW OF SYSTEMS:  As noted in the HPI.  Denies any orthopnea, PND, lower  extremity edema or palpitations.  Denies any prior history of a myocardial  infarction, congestive heart failure, syncope or stroke.  Denies any  symptoms of reflux disease.  Denies any recent evidence of upper or lower GI  bleeding.  The remaining systems negative.   PHYSICAL EXAMINATION:  VITAL SIGNS:  Blood pressure 119/80, pulse 65 and  regular, respirations 18, temperature 97.7 degrees.  Saturation 98% on 4  liters.  GENERAL:  A 70 year old male in no apparent distress.  HEENT:  Normocephalic and atraumatic.  NECK:  Visible left carotid pulse without bruits.  No  jugular venous  distention.  LUNGS:  Clear to auscultation in all fields.  HEART:  A regular rate and rhythm.  (S1 and S2.)  No murmurs, rubs or  gallops.  ABDOMEN:  Soft, nontender.  Intact bowel sounds without bruits.  EXTREMITIES:  Preserved bilateral femoral pulses without bruits.  Intact  distal pulses without edema.  NEUROLOGIC:  No focal deficits.   ADMISSION CHEST X-RAY:  No acute disease.   ELECTROCARDIOGRAM:  Normal sinus rhythm at 61 beats per minute, normal axis,  nonspecific changes.   LABORATORY DATA:  Hemoglobin 14.6, hematocrit 43, WBC 5.5, platelets 206.  Sodium 137, potassium 4.6, BUN 16, creatinine 1.5, glucose 262.  D-dimer  less than 0.22.  Cardiac enzymes (POC):  MB 2.1, 1.7; troponin I less than  0.05 (2).   IMPRESSION:  1.  Unstable angina pectoris.  2.  Multiple cardiac risk factors      1.  Tobacco.      2.  Age.      3.  Hyperglycemia.   RECOMMENDATIONS:   The patient presents with new onset chest discomfort with  associated symptoms worrisome for unstable angina pectoris.  He also  presents with cardiac risk factors notable for tobacco, age and new onset  hyperglycemia.  The recommendation therefore is to proceed with definitive  exclusion of significant coronary artery disease with a diagnostic cardiac  catheterization.  This was explained to the patient, including the risks and  benefits, and the patient is agreeable to proceed.  We would recommend the  continuation of aspirin, IV nitroglycerin and heparin.  We will also start  Lopressor 25 mg b.i.d.  A fasting lipid profile is currently pending.  We  will also check a hemoglobin A1c.      Gene Serpe, P.A. [facility]    ______________________________  Rollene Rotunda, M.D.    GS/MEDQ  D:  04/23/2005  T:  04/23/2005  Job:  [id number]

## 2010-09-11 NOTE — Assessment & Plan Note (Signed)
[facility]                            CARDIOLOGY OFFICE NOTE   NAME:[name], [name]                          MRN:          [id number]  DATE:06/15/2006                            DOB:          [birth year]    PRIMARY CARDIOLOGIST:  Dr. Rollene Rotunda.   PRIMARY CARE PHYSICIAN:  Dr. Deatra Emillio.   HISTORY OF PRESENT ILLNESS:  Mr. Nifong is a very pleasant 70 year old  male patient with a history of coronary artery disease status post ST  elevation myocardial infarction February 2007 secondary to a distal  occlusion of the diagonal which was treated medically. He had non-  obstructive disease elsewhere. We have seen him several times back in  the office with persistent non exertional chest pain and he was treated  with long acting nitrates. He presents to the office today for follow up  per the Horizon study protocol. He is doing well. He recently saw Dr.  Sherene Sires for cough. He was placed on proton pump inhibitor therapy. His ACE  inhibitor was also discontinued and he was placed on an ARB. He had PFTs  that were normal. The patient notes atypical chest pains. These are sort  of left-sided and are sharp in nature. They last about 2 to 3 seconds.  He usually takes a nitroglycerine for this. However he tells me that by  the time he gets to the nitroglycerine to take it, his pain is resolved.  He also has occasional shortness of breath. This only lasts for a brief  second. He denies any exertional symptoms. He denies any syncope. Denies  any palpitations.   MEDICATIONS:  He ran out of his medications about a week ago and is  currently not taking anything. He should be taking;  1. Lipitor 40 mg q.h.s.  2. Toprol XL 25 mg daily.  3. Plavix 75 mg daily.  4. Glimepiride 2 mg daily.  5. Aspirin 81 mg daily.  6. Benicar 20 mg daily.  7. Omeprazole 20 mg daily.  8. Imdur 120 mg daily.  9. Darvocet p.r.n.  10.Delsym p.r.n.  11.Nitroglycerine p.r.n.   PHYSICAL  EXAMINATION:  He is a well-nourished, well-developed, male in  no distress, blood pressure is 134/72, pulse 83, weight 190 pounds.  HEENT: Unremarkable.  NECK: Without JVD.  CARDIAC: S1, S2, regular rate and rhythm.  LUNGS: Clear to auscultation bilaterally, without wheezing, rhonchi, or  rales.  ABDOMEN: Soft, nontender.  EXTREMITIES: Without edema.  NEUROLOGIC: He is alert and oriented x3, cranial nerves II-XII grossly  intact.   Electrocardiogram reveals sinus rhythm with a heart rate of 71, normal  axis, no acute changes. His tracing has actually improved from his  previous EKG obtained in March of 2007 where he had inferior and  anterior T wave inversions. These have resolved on his current tracing.   IMPRESSION:  1. Atypical chest discomfort.  2. Coronary artery disease.      a.     Status post ST elevation myocardial infarction June 21, 2005 secondary to a distal occlusion of the  diagonal- treated       medically.      b.     Residual CAD: 50% mid left anterior descending and normal       circumflex and right coronary artery.  3. Ejection fraction of 45% by echocardiogram February 2007.  4. History of disseminated MRSA.  5. Diabetes mellitus type 2.  6. Dyslipidemia.  7. Cigar smoker.  8. Cough-question secondary to acid reflux disease exacerbated by ACE      inhibitors followed by Dr. Sherene Sires.  9. Horizon study   PLAN:  The patient presents to the office today for follow up for the  Horizon study. He continues to note atypical chest pains. These have  been ongoing for quite some time now. There has been no change and no  increase in symptoms. These symptoms are nothing like what he had at his  myocardial infarction. He has had some musculoskeletal chest pain with  his cough. This seems to be improving as his cough is improving. His  electrocardiogram is normal and has actually improved from his previous  electrocardiogram. At this time no further cardiovascular  testing is  warranted. The patient has been noncompliant with his medications due to  cost. He does have insurance but is unable to afford refills on his  medications until the end of this month. I have provided him with  samples of Lipitor, Plavix, Benicar. I have substituted Protonix samples  for the omeprazole. I have given him a prescription for metoprolol 25 mg  half a tablet twice a day that he can get filled at [facility] for $4 to  substitute for the Toprol. I have asked him to follow up with his  primary care physician to get samples of his diabetic medications. I  have explained to him the importance of all of his medications and that  he can feel free to contact us  for samples in the future should he run into this problem again. I will  have him follow up with Dr. Antoine Poche in the next 3 months and he will  follow up to research as scheduled.      Tereso Newcomer, PA-C  Electronically Signed      Veverly Fells. Excell Seltzer, MD  Electronically Signed   SW/MedQ  DD: 06/15/2006  DT: 06/15/2006  Job #: [id number]   cc:   Deatra Saturnino, M.D.

## 2010-09-11 NOTE — Discharge Summary (Signed)
NAME:  [name], [name] NO.:  [id number]   MEDICAL RECORD NO.:  [id number]          PATIENT TYPE:  INP   LOCATION:  2040                         FACILITY:  [facility]   PHYSICIAN:  [place] Bing, M.D. [facility] OF BIRTH:  Oct 19, 1940   DATE OF ADMISSION:  06/21/2005  DATE OF DISCHARGE:  06/23/2005                                 DISCHARGE SUMMARY   PRIMARY CARDIOLOGIST:  Rollene Rotunda, M.D.   DISCHARGING DIAGNOSES:  1.  Coronary artery disease/myocardial infarction (ST elevated) status post      cardiac catheterization on June 21, 2005 by Dr. Samule Ohm. The patient      was left anterior descending 50% stenosis of the mid vessel, most apical      portion of the vessel completely occluded which is new compared to      April 27, 2005. Most likely this is the culprit for his myocardial      infarction. The vessel is quite small. Will need to be managed      medically.  2.  Recent sepsis, hospitalized here January 16 to January 22 for treatment      of disseminated methicillin-resistant Staph aureus with positive blood      cultures as well as involvement of the right biceps/shoulder and left      wrist and hand.  3.  Status post debridement of the right shoulder secondary to the presence      of an abscess.   PAST MEDICAL HISTORY:  1.  Atypical chest pain with nonobstructive coronary artery disease by      cardiac catheterization April 27, 2005.  2.  Normal ventricular function.  3.  Disseminated methicillin-resistant Staph aureus status post right      shoulder debridement.  4.  Type 2 diabetes.  5.  Hyperlipidemia.  6.  Tobacco abuse.   HOSPITAL COURSE:  [name] is a 70 year old African American gentleman who  initially evaluated in consultation in December 2006 with chest discomfort,  underwent subsequent cardiac catheterization revealing nonobstructive CAD at  that time. The patient was then rehospitalized here in January of this year  for MRSA with  abscess/debridement of right shoulder. The patient was treated  with Vancomycin and Unasyn and a 2 month course of doxycycline. The patient  returned to the emergency room on this admission complaining of severe  midsternal chest discomfort. Patient found to have EKG showing significant  ST elevation in leads V3 through V5 with point of care markers troponin of  0.020. The patient was admitted, treated with heparin, nitroglycerin,  continued medications from home. The patient was taken to the cath lab on  June 21, 2005 by Dr. Samule Ohm. Results as stated above. The patient  tolerated the procedure without complications. Cardiac rehabilitation in to  work with patient. The patient's condition stable, transferred to telemetry.  Medications titrated as tolerated. CT of the chest negative for pulmonary  embolisms or dissections, elevated D-dimer of 1.04. Dr. Dietrich Pates in to see  patient on day of discharge, afebrile, cath site benign. The patient okay  for discharge home to follow-up with  Dr. Antoine Poche.   MEDICATIONS AT DISCHARGE:  1.  Fish oil 1000 mg p.o. b.i.d.  2.  Lipitor 40 mg at bedtime.  3.  Altace 5 mg p.o. b.i.d.  4.  Toprol XL 25 mg daily.  5.  Plavix 75 mg daily.  6.  Enteric coated aspirin 325 mg daily.  7.  Patient instructed to continue his doxycycline 100 mg p.o. b.i.d. as      previously instructed by infectious disease.   DIET:  Diet includes low fat, low salt, low cholesterol.   ACTIVITY:  Wound care per post cardiac catheterization.   DISCHARGE INSTRUCTIONS:  1.  Nitroglycerin if needed for chest discomfort.  2.  Smoking cessation strongly encouraged.  3.  Follow-up with Dr. Rollene Rotunda March 13 at 12 noon.  4.  The patient instructed to call our office with any problems from cath      site prior to this appointment. He has been given prescriptions for his      Toprol, Lipitor, Altace, Plavix and nitroglycerin.   Duration of discharge encounter 30  minutes.      Dorian Pod, NP      [place] Bing, M.D. [facility]  Electronically Signed    MB/MEDQ  D:  06/23/2005  T:  06/23/2005  Job:  [id number]   cc:   [place] Bing, M.D. [facility]  [address]  [address]  [place]  [place] [address]   Rollene Rotunda, M.D.  [address]  [address]  [place]  [place] [address]

## 2010-09-11 NOTE — H&P (Signed)
NAME:  [name] NO.:  [id number]   MEDICAL RECORD NO.:  [id number]          PATIENT TYPE:  INP   LOCATION:  1827                         FACILITY:  [facility]   PHYSICIAN:  Corinna L. Lendell Caprice, MDDATE OF BIRTH:  05-18-1940   DATE OF ADMISSION:  05/11/2005  DATE OF DISCHARGE:                                HISTORY & PHYSICAL   CHIEF COMPLAINT:  Hand swelling and pain.   [name] is a 70 year old black male who was recently discharged from the  hospital at which time he was worked up for chest pain which turned out to  be musculoskeletal.  He also had new onset diabetes and hyperlipidemia at  the time.  He has presented to the emergency room 3 times within the past  several days with complaints of migratory pain.  He had complained of left  wrist pain a few weeks ago.  Subsequently his whole arm started hurting and  yesterday his wrists and hands became acutely swollen and he is unable to  move the fingers or wrists.  He also complains of right shoulder pain and  upper arm swelling.  He denies any fevers or chills, but his temperature  here in the emergency room is 100.9.  He denies any rash.  He denies any  genital ulceration or discharge.  He has no previous history of connective  tissue diseases.  He was given prescription for OxyContin and prednisone as  well as indomethacin as an outpatient.   PAST MEDICAL HISTORY:  1.  Type II diabetes.  2.  Hyperlipidemia.  3.  Chronic renal insufficiency.  4.  Hypertriglyceridemia.   MEDICATIONS AT DISCHARGE:  1.  Zocor 20 mg nightly.  2.  Amaryl 2 mg a day.  3.  Aspirin 81 mg a day.  4.  He has finished his prednisone.   SOCIAL HISTORY, FAMILY HISTORY:  Reviewed and as per previous dictation.   REVIEW OF SYSTEMS:  As above, otherwise negative.   PHYSICAL EXAMINATION:  His temperature is 100.9, blood pressure 132/80,  pulse 96, respiratory rate 18.  Oxygen saturation 98% on room air.  GENERAL:  The patient is  a black male who is somewhat somnolent but appears  to be in quite a bit of pain.  HEENT:  Normocephalic, atraumatic.  Pupils equal, round reactive to light.  Sclerae nonicteric.  Moist mucous membranes.  NECK:  Supple.  No lymphadenopathy.  LUNGS:  Clear to auscultation bilaterally without wheezes, rhonchi or rales.  CARDIOVASCULAR:  Regular rate and rhythm without murmurs, gallops, rubs.  ABDOMEN:  Soft, nontender, nondistended.  GU AND RECTAL:  Deferred.  EXTREMITIES:  His left wrist and hand are very tender and tensely swollen,  somewhat warm.  He is unable to move his wrist or grip with his fingers.  His left elbow has an IV in it, but shows no swelling.  Left shoulder has no  tenderness and full range of motion.  Right proximal arm is very swollen and  he is unable abduct or flex his right shoulder due to pain.  The rest of his  joint exam was within normal limits.   LABORATORIES:  White blood cell count 19,000 with greater than 20% bands.  Hemoglobin is 11.8, hematocrit 34.6.  Basic metabolic panel is significant  for a sodium of 130, glucose of 136, BUN 24, creatinine 1.7.  Creatinine on  January 7 was 1.5.  Uric acid was 4.9.  CPK on January 13 was 67.  Chest x-  ray shows nothing acute.  EKG shows sinus tachycardia with right atrial  enlargement.   ASSESSMENT AND PLAN:  1.  Left wrist and hand swelling and pain and right proximal arm and      shoulder swelling and pain:  Given his low grade fever, leukocytosis      with left shift, I am concerned about a systemic infection.  He has      received a dose of Vancomycin and blood cultures have been drawn.  I      will continue the vancomycin and also start Unasyn.  Also we will need      to get Dopplers to rule out DVT as well as TSH, C-reactive protein, ANA,      rheumatoid factor, CPK, PT, PTT to rule out connective      tissue/vasculitis.  2.  Diabetes:  Continue medications.  3.  Hyperlipidemia and hypertriglyceridemia:  I  will hold his Zocor for now.  4.  Chronic renal insufficiency.      Corinna L. Lendell Caprice, MD  Electronically Signed     CLS/MEDQ  D:  05/11/2005  T:  05/11/2005  Job:  [id number]

## 2010-09-11 NOTE — H&P (Signed)
NAME:  [name], [name] NO.:  [id number]   MEDICAL RECORD NO.:  [id number]          PATIENT TYPE:  INP   LOCATION:  2914                         FACILITY:  [facility]   PHYSICIAN:  Charlton Haws, M.D.     DATE OF BIRTH:  [birth year]   DATE OF ADMISSION:  06/21/2005  DATE OF DISCHARGE:                                HISTORY & PHYSICAL   PRIMARY CARDIOLOGIST:  Dr. Rollene Rotunda.   REASON FOR ADMISSION:  [name] is a 70 year old male, recently referred to  [place] in consultation for evaluation of chest pain on April 23, 2005, with  subsequent cardiac catheterization revealing nonobstructive coronary artery  disease and normal left ventricular function. Specifically, he was found to  have 40% proximal/30% mid  LAD disease with otherwise normal circumflex and  RCA.   He was then rehospitalized here (January 16 - January 26) for treatment of  disseminated MRSA. He was found to have positive blood cultures as well as  involvement of the right biceps/shoulder and left wrist/hand.   The patient was treated with IV vancomycin and Unasyn and ultimately  discharged with instructions to complete a 28-day course of IV vancomycin,  followed by a 60-month course of doxycycline 100 mg twice daily.   During his hospitalization, he was referred to Dr. Annell Greening for  polyarticular symptoms and underwent debridement of the right shoulder  secondary to presence of an abscess. However, there was no evidence of  osteomyelitis. He also required aspiration of the left hand and wrist as  well.   Infectious disease was consulted as well and felt that the source of the  bacteremia was secondary to possible peripheral IV phlebitis during previous  admission.   The patient now returns to the emergency room with severe midsternal chest  pain. This developed acutely at approximately 9:40 this morning while  sitting in his car waiting for a scheduled appointment for shoulder  rehabilitation. He  states that this pain was worse than what he had we last  saw him, and there was some associated mild dyspnea but no diaphoresis.  There was no radiation to the jaw or upper extremities.   The patient was referred to EMS, treated with nitroglycerin in the field  with no reported effect, and transported to the emergency room. He is  currently on IV nitroglycerin but continues to report persistent pain. He  has been placed on IV heparin and was also treated with 4 mg of morphine.   Initial cardiac markers were negative, but a second troponin (point-of-care)  was positive at 0.20. Admission EKG shows significant ST elevation in leads  V3-V5 with suggestion of more diffuse involvement compared to previous  study. Chest x-ray was negative.   ALLERGIES:  No known drug allergies.   CURRENT MEDICATIONS:  1.  Aspirin 81 daily.  2.  Doxycycline 100 mg twice daily.  (2 months total).   PAST MEDICAL HISTORY:  1.  History of atypical chest pain.  Nonobstructive coronary artery disease      (question microvascular disease) by cardiac catheterization on April 27, 2005.  2.  Normal ventricular function.  3.  Disseminated MRSA status post right shoulder debridement  4.  Type 2 diabetes mellitus.  5.  Hyperlipidemia.  6.  Tobacco.   SOCIAL HISTORY:  The patient lives alone in [place]. He is a Therapist, occupational. The  patient has one daughter and is separated from his wife. He smokes an  occasional cigar and denies alcohol use.   FAMILY HISTORY:  Mother deceased at age 23 secondary to complications from  surgery. Father deceased age 82, complications from prostate cancer; history  of strokes. Siblings have no known coronary disease.   REVIEW OF SYSTEMS:  As noted per HPI, otherwise negative.   PHYSICAL EXAMINATION:  VITAL SIGNS: Blood pressure 163/90, pulse 57,  regular, respirations 21, temperature 98.3, saturation 99%.  GENERAL:  A 70 year old male in no apparent distress.  HEENT:  Normocephalic, atraumatic.  NECK:  Preserved bilateral carotid pulses without bruits.  LUNGS: Clear to auscultation all fields.  HEART: Regular rhythm (S1 and S2), no significant murmurs, no rub.  ABDOMEN: Soft, nontender.  EXTREMITIES: Right biceps area scar which appears benign. His extremities  have intact distal pulses and without edema.  NEUROLOGIC: No focal deficit   IMPRESSION:  Mr.  Rivas is a 70 year old male, with history of atypical  chest pain and recent cardiac catheterization revealing nonobstructive  coronary artery disease, but with TIMI-2 flow of the right coronary artery,  normal left ventricular function, and recent hospitalization for treatment  of disseminated methicillin-resistant Staphylococcus aureus.   He now returns to the emergency room with complaint of severe, sudden  midsternal chest pain and has 1-2 abnormal troponin markers with EKG changes  which are different from recent studies.   This may represent myopericarditis versus endothelial dysfunction.   PLAN:  Recommendation is to proceed with re-look coronary angiography, and  arrangements have been made. The patient is in agreement with this plan. We  will also repeat a 2-D echocardiogram for the morning and also check an ESR  level. Serial cardiac markers will continue to be cycled, and we will repeat  EKG in the morning. Of note, the patient will require treatment with oral  nitrates and a calcium channel blocker.      Gene Serpe, P.A. [facility]    ______________________________  Charlton Haws, M.D.    GS/MEDQ  D:  06/21/2005  T:  06/21/2005  Job:  [id number]   cc:   [name], M.D.  Fax: [phone]

## 2010-09-11 NOTE — Discharge Summary (Signed)
NAME:  [name], [name] NO.:  [id number]   MEDICAL RECORD NO.:  [id number]          PATIENT TYPE:  INP   LOCATION:  3731                         FACILITY:  [facility]   PHYSICIAN:  Corinna L. Lendell Caprice, MDDATE OF BIRTH:  1941-03-27   DATE OF ADMISSION:  04/23/2005  DATE OF DISCHARGE:  04/27/2005                                 DISCHARGE SUMMARY   DIAGNOSES:  1.  Chest pain, suspect musculoskeletal.  2.  Newly diagnosed diabetes.  3.  Hyperlipidemia.  4.  Hypertriglyceridemia.   DISCHARGE MEDICATIONS:  1.  Zocor 20 milligrams nightly.  2.  Amaryl 2 milligrams a day.  3.  Aspirin 81 milligrams a day.   He is to follow up with primary care physician of his choice in two weeks.  He will need diabetic teaching as an outpatient.   DIET:  Should be low cholesterol diabetic.   ACTIVITY:  Ad lib.   CONDITION:  Stable.   CONSULTATIONS:  Dr. Antoine Poche.   PROCEDURES:  Cardiac catheterization which showed no obstructive coronary  artery disease but possible microvascular disease. Pending tests  echocardiogram.   PERTINENT LABORATORY DATA:  CBC unremarkable. PT/PTT normal. D-dimer less  than 0.22. Complete metabolic panel significant for a creatinine of 1.5  glucose of 262. Otherwise unremarkable. Cardiac enzymes negative serially.  Hemoglobin A1c was 8.7. Total cholesterol 238, triglycerides 702 with HDL  38. LDL was not calculated. PSA was 3.69. Special studies in radiology:  Serial EKG's showed normal sinus rhythm. Chest x-ray showed nothing acute.   HISTORY AND HOSPITAL COURSE:  [name] is a 70 year old unassigned black  male who had not seen a doctor for many years and presented to the emergency  room with severe left-sided chest pain. He also had some transient confusion  and radiation to his arm. He also had some mid back pain. Please see H&P for  complete details. His vital signs were significant for blood pressure 149/94  and he had an otherwise normal  examination. He was admitted to telemetry and  ruled out for myocardial infarction. He was started on nitroglycerin,  aspirin, oxygen, heparin. He has not seen a doctor for many years and was  found to have the above new problems. He was started on Amaryl and Zocor.  Cardiology was consulted and performed catheterization which showed normal  ejection fraction and no occlusive disease. Please see cardiac  catheterization report for full details. An echocardiogram was ordered by  cardiology and this is at this time pending. I had asked the nurses to give  diabetic teaching but apparently this has not been done over the holiday  weekend. He will need diabetic teaching as an outpatient and will he will  need repeat fasting lipid panel in three months, as well as liver function  tests. He may need to start a medication for hypertriglyceridemia;  hopefully, rate control of the diabetes will help this. He continued to have  episodes of chest pain and nitroglycerin did not help this. Morphine helped  a bit also Toradol helped.      Corinna L. Lendell Caprice, MD  Electronically Signed     CLS/MEDQ  D:  04/27/2005  T:  04/27/2005  Job:  [id number]

## 2011-01-20 ENCOUNTER — Telehealth: Payer: Self-pay | Admitting: *Deleted

## 2011-01-20 NOTE — Telephone Encounter (Signed)
Dr Keith Rake DDS called and stated she needs to hold pts Plavix and Asa for 5 days prior to an extraction.  Pt is being put on an antibiotic as well.  Will forward to Dr Antoine Poche for his review and information

## 2011-01-20 NOTE — Telephone Encounter (Signed)
OK to hold Plavix 10 months after a DES stent.  However, there is increased risk to holding both antiplatelet agents.  This risk of stent thrombosis and myocardial infarction should be weighed against the risk of bleeding.  Please call the patient with this information and fax this message to Dr. Myriam Forehand office.  [employer]

## 2011-01-21 NOTE — Telephone Encounter (Signed)
Recommendations called to office and faxed to [phone].

## 2011-03-04 ENCOUNTER — Other Ambulatory Visit: Payer: Self-pay

## 2011-03-04 MED ORDER — NITROGLYCERIN 0.4 MG SL SUBL: 0.4000 mg | SUBLINGUAL_TABLET | SUBLINGUAL | Status: DC

## 2011-03-04 NOTE — Telephone Encounter (Signed)
.   Requested Prescriptions   Signed Prescriptions Disp Refills  . nitroGLYCERIN (NITROSTAT) 0.4 MG SL tablet 25 tablet 6    Sig: Place 1 tablet (0.4 mg total) under the tongue as directed.    Authorizing Provider: Rollene Rotunda    Ordering User: Lacie Scotts   E-scribe to [facility] today.

## 2011-10-21 ENCOUNTER — Other Ambulatory Visit: Payer: Self-pay | Admitting: Cardiology

## 2011-10-21 NOTE — Telephone Encounter (Signed)
..   Requested Prescriptions   Pending Prescriptions Disp Refills  . clopidogrel (PLAVIX) 75 MG tablet [Pharmacy Med Name: CLOPIDOGREL 75 MG TABLET] 30 tablet 1    Sig: TAKE 1 TABLET BY MOUTH EVERY DAY  Patient needs to  Call office for appointment

## 2012-05-09 ENCOUNTER — Other Ambulatory Visit: Payer: Self-pay | Admitting: Cardiology

## 2012-05-30 ENCOUNTER — Ambulatory Visit (INDEPENDENT_AMBULATORY_CARE_PROVIDER_SITE_OTHER): Payer: Self-pay | Admitting: Cardiology

## 2012-05-30 ENCOUNTER — Encounter: Payer: Self-pay | Admitting: Cardiology

## 2012-05-30 VITALS — BP 138/69 | HR 60 | Ht 71.0 in | Wt 187.2 lb

## 2012-05-30 DIAGNOSIS — I10 Essential (primary) hypertension: Secondary | ICD-10-CM

## 2012-05-30 DIAGNOSIS — E785 Hyperlipidemia, unspecified: Secondary | ICD-10-CM

## 2012-05-30 DIAGNOSIS — I2510 Atherosclerotic heart disease of native coronary artery without angina pectoris: Secondary | ICD-10-CM

## 2012-05-30 DIAGNOSIS — F17200 Nicotine dependence, unspecified, uncomplicated: Secondary | ICD-10-CM

## 2012-05-30 NOTE — Patient Instructions (Addendum)
Your may stop your Plavix Continue all other medications as listed.  Please return fasting for a lipid panel.  Your physician has requested that you have an exercise tolerance test. For further information please visit [email or web address] Please also follow instruction sheet, as given.

## 2012-05-30 NOTE — Progress Notes (Signed)
HPI The patient presents for evaluation of coronary disease. He has been gone from this practice for a couple of years. He says he's been trying to do yoga and other therapies. However, he is now told by his primary provider to followup with [place] if he wants prescriptions renewed.  He actually has been out of Plavix. He did have one episode of chest discomfort. This was about a month ago. It happened at rest. He thought it was somewhat similar to his previous heart pain. He took one nitroglycerin. He otherwise has been in good reactive and has done yard work recently without bringing on any symptoms. The patient denies any new symptoms such as neck or arm discomfort. There has been no new shortness of breath, PND or orthopnea. There have been no reported palpitations, presyncope or syncope.  No Known Allergies  Current Outpatient Prescriptions  Medication Sig Dispense Refill  . aspirin 325 MG tablet Take 325 mg by mouth daily.      Marland Kitchen atorvastatin (LIPITOR) 40 MG tablet Take 40 mg by mouth daily.      . clopidogrel (PLAVIX) 75 MG tablet TAKE 1 TABLET EVERY DAY  30 tablet  0  . diclofenac sodium (VOLTAREN) 1 % GEL Apply topically.      . fish oil-omega-3 fatty acids 1000 MG capsule Take 2 g by mouth daily.      Marland Kitchen HYDROcodone-acetaminophen (VICODIN) 5-500 MG per tablet Take 1 tablet by mouth every 6 (six) hours as needed.      . metFORMIN (GLUMETZA) 1000 MG (MOD) 24 hr tablet Take 1,000 mg by mouth 2 (two) times daily with a meal.      . Multiple Vitamin (MULTIVITAMIN) tablet Take 1 tablet by mouth daily.      . nitroGLYCERIN (NITROSTAT) 0.4 MG SL tablet Place 1 tablet (0.4 mg total) under the tongue as directed.  25 tablet  6  . oxyCODONE (OXYCONTIN) 40 MG 12 hr tablet Take 40 mg by mouth as needed.      . ramipril (ALTACE) 10 MG capsule Take 10 mg by mouth daily.        Past Medical History  Diagnosis Date  . Coronary artery disease     80% LAD stenosis with Promus 2.75 by 18 stent.  Marland Kitchen Hx  MRSA infection   . Diabetes mellitus without complication   . Dyslipidemia     Past Surgical History  Procedure Date  . Total shoulder replacement     Right    ROS: As stated in the HPI and negative for all other systems.  PHYSICAL EXAM BP 138/69  Pulse 60  Ht 5\' 11"  (1.803 m)  Wt 187 lb 3.2 oz (84.913 kg)  BMI 26.11 kg/m2 GENERAL:  Well appearing HEENT:  Pupils equal round and reactive, fundi not visualized, oral mucosa unremarkable NECK:  No jugular venous distention, waveform within normal limits, carotid upstroke brisk and symmetric, no bruits, no thyromegaly LYMPHATICS:  No cervical, inguinal adenopathy LUNGS:  Clear to auscultation bilaterally BACK:  No CVA tenderness CHEST:  Unremarkable HEART:  PMI not displaced or sustained,S1 and S2 within normal limits, no S3, no S4, no clicks, no rubs, no murmurs ABD:  Flat, positive bowel sounds normal in frequency in pitch, no bruits, no rebound, no guarding, no midline pulsatile mass, no hepatomegaly, no splenomegaly EXT:  2 plus pulses throughout, no edema, no cyanosis no clubbing SKIN:  No rashes no nodules NEURO:  Cranial nerves II through XII grossly intact, motor  grossly intact throughout [facility]:  Cognitively intact, oriented to person place and time  EKG:  Sinus rhythm, rate 60, axis within normal limits, intervals within normal limits, nonspecific T-wave flattening, poor anterior R wave progression.  05/30/2012   ASSESSMENT AND PLAN  CAD I will bring the patient back for a POET (Plain Old Exercise Test). This will allow me to screen for obstructive coronary disease, risk stratify and very importantly provide a prescription for exercise.  We discussed the benefits of continued secondary risk reduction with medical therapy.  He can come off of his Plavix.  HTN The blood pressure is at target. No change in medications is indicated. We will continue with therapeutic lifestyle changes (TLC).  Hyperlipidemia The patient will  come back for fasting lipid profile. He's not quite on target does statin that I would suggest that I will guide further changes based on a lipid level.

## 2012-06-22 ENCOUNTER — Ambulatory Visit (INDEPENDENT_AMBULATORY_CARE_PROVIDER_SITE_OTHER): Payer: Medicare PPO | Admitting: Cardiology

## 2012-06-22 ENCOUNTER — Other Ambulatory Visit (INDEPENDENT_AMBULATORY_CARE_PROVIDER_SITE_OTHER): Payer: Medicare PPO

## 2012-06-22 DIAGNOSIS — I2510 Atherosclerotic heart disease of native coronary artery without angina pectoris: Secondary | ICD-10-CM

## 2012-06-22 DIAGNOSIS — E785 Hyperlipidemia, unspecified: Secondary | ICD-10-CM

## 2012-06-22 LAB — LIPID PANEL
Cholesterol: 135 mg/dL (ref 0–200)
HDL: 35.3 mg/dL — ABNORMAL LOW (ref >39.00)
LDL Cholesterol: 68 mg/dL (ref 0–99)
Total CHOL/HDL Ratio: 4
Triglycerides: 161 mg/dL — ABNORMAL HIGH (ref 0.0–149.0)
VLDL: 32.2 mg/dL (ref 0.0–40.0)

## 2012-06-22 NOTE — Procedures (Signed)
Exercise Treadmill Test  Pre-Exercise Testing Evaluation Rhythm: normal sinus  Rate: 68     Test  Exercise Tolerance Test Ordering MD: Angelina Sheriff, MD  Interpreting MD: Angelina Sheriff, MD  Unique Test No: 1  Treadmill:  1  Indication for ETT: cad  Contraindication to ETT: No   Stress Modality: exercise - treadmill  Cardiac Imaging Performed: non   Protocol: standard Bruce - maximal  Max BP:  206/68  Max MPHR (bpm):  151 85% MPR (bpm):  128  MPHR obtained (bpm):  136 % MPHR obtained:  90  Reached 85% MPHR (min:sec):  4:30 Total Exercise Time (min-sec):  6:00  Workload in METS:  7 Borg Scale: 17  Reason ETT Terminated:  desired heart rate attained    ST Segment Analysis At Rest: normal ST segments - no evidence of significant ST depression With Exercise: no evidence of significant ST depression  Other Information Arrhythmia:  No Angina during ETT:  absent (0) Quality of ETT:  diagnostic  ETT Interpretation:  normal - no evidence of ischemia by ST analysis  Comments: The patient had a moderate to poor exercise tolerance.  There was no chest pain.  There was an appropriate level of dyspnea.  There were no arrhythmias, a normal heart rate response but an elevated  BP response.  There were no ischemic ST T wave changes.  There was an abnormal heart rate recovery.  Recommendations: Negative adequate ETT.  No further testing is indicated.  Based on the above I gave the patient a prescription for exercise.

## 2012-07-04 ENCOUNTER — Encounter: Payer: Self-pay | Admitting: *Deleted

## 2015-03-17 DIAGNOSIS — R002 Palpitations: Secondary | ICD-10-CM | POA: Diagnosis not present

## 2015-03-17 DIAGNOSIS — I10 Essential (primary) hypertension: Secondary | ICD-10-CM | POA: Diagnosis not present

## 2015-03-17 DIAGNOSIS — I2510 Atherosclerotic heart disease of native coronary artery without angina pectoris: Secondary | ICD-10-CM | POA: Diagnosis not present

## 2015-03-17 DIAGNOSIS — E7800 Pure hypercholesterolemia, unspecified: Secondary | ICD-10-CM | POA: Diagnosis not present

## 2015-03-17 DIAGNOSIS — N182 Chronic kidney disease, stage 2 (mild): Secondary | ICD-10-CM | POA: Diagnosis not present

## 2015-03-17 DIAGNOSIS — E782 Mixed hyperlipidemia: Secondary | ICD-10-CM | POA: Diagnosis not present

## 2015-03-17 DIAGNOSIS — Z23 Encounter for immunization: Secondary | ICD-10-CM | POA: Diagnosis not present

## 2015-03-17 DIAGNOSIS — E1122 Type 2 diabetes mellitus with diabetic chronic kidney disease: Secondary | ICD-10-CM | POA: Diagnosis not present

## 2015-03-17 DIAGNOSIS — E1165 Type 2 diabetes mellitus with hyperglycemia: Secondary | ICD-10-CM | POA: Diagnosis not present

## 2015-03-28 ENCOUNTER — Telehealth: Payer: Self-pay | Admitting: Cardiology

## 2015-03-28 NOTE — Telephone Encounter (Signed)
Close encounter 

## 2015-04-10 ENCOUNTER — Ambulatory Visit (INDEPENDENT_AMBULATORY_CARE_PROVIDER_SITE_OTHER): Payer: Medicare PPO | Admitting: Cardiology

## 2015-04-10 ENCOUNTER — Other Ambulatory Visit: Payer: Self-pay | Admitting: Cardiology

## 2015-04-10 ENCOUNTER — Encounter: Payer: Self-pay | Admitting: Cardiology

## 2015-04-10 ENCOUNTER — Other Ambulatory Visit: Payer: Self-pay | Admitting: *Deleted

## 2015-04-10 VITALS — BP 138/68 | HR 58 | Ht 71.0 in | Wt 186.3 lb

## 2015-04-10 DIAGNOSIS — R0602 Shortness of breath: Secondary | ICD-10-CM | POA: Diagnosis not present

## 2015-04-10 DIAGNOSIS — R072 Precordial pain: Secondary | ICD-10-CM | POA: Diagnosis not present

## 2015-04-10 DIAGNOSIS — I25118 Atherosclerotic heart disease of native coronary artery with other forms of angina pectoris: Secondary | ICD-10-CM | POA: Diagnosis not present

## 2015-04-10 MED ORDER — ATORVASTATIN CALCIUM 80 MG PO TABS: 80.0000 mg | ORAL_TABLET | Freq: Every day | ORAL | Status: DC

## 2015-04-10 MED ORDER — NITROGLYCERIN 0.4 MG SL SUBL: 0.4000 mg | SUBLINGUAL_TABLET | SUBLINGUAL | Status: DC

## 2015-04-10 NOTE — Patient Instructions (Addendum)
Your physician recommends that you schedule a follow-up appointment in: 4 Months  Your physician has requested that you have a lexiscan myoview. For further information please visit [email or web address] Please follow instruction sheet, as given.  Your physician has recommended you make the following change in your medication: Increase Atorvastatin 80 mg daily and Decrease aspirin 81 mg daily.  Merry Christmas and Happy New Year

## 2015-04-10 NOTE — Progress Notes (Signed)
Cardiology Office Note   Date:  04/10/2015   ID:  [name], DOB 04-04-1941, MRN [id number]  PCP:  Leanor Rubenstein, MD  Cardiologist:   Rollene Rotunda, MD   Chief Complaint  Patient presents with  . Coronary Artery Disease  . Chest Pain      History of Present Illness: [name] is a 74 y.o. male who presents for for evaluation of chest pain. He has a history of coronary disease as described below. A few years since I seen him. In 2014 he had a treadmill negative for any evidence of ischemia.  He says that he's been having chest discomfort. He thinks it's been coming on for a week but then talks about more distant episodes. It is somewhat atypical in that it only happens at night. He can do activities such as raking leaves without bringing it on. However, it is a heaviness. Last for about a minute. He says he does have some help with nitroglycerin when he takes this. It is moderate in intensity. He doesn't describe associated symptoms such as nausea vomiting or diaphoresis. He doesn't describe necessarily palpitations, presyncope or syncope. He's not sure that this is like anything he's had before. He doesn't associate it with foods. He's never had reflux.  He's not describing PND or orthopnea. There is no radiation to the jaw or to his arms.  Past Medical History  Diagnosis Date  . Coronary artery disease     2007 distal LAD occlusion medically managed.  80% LAD stenosis with Promus 2.75 by 18 stent. 2011  . Hx MRSA infection   . Diabetes mellitus without complication (HCC)   . Dyslipidemia     Past Surgical History  Procedure Laterality Date  . Total shoulder replacement      Right     Current Outpatient Prescriptions  Medication Sig Dispense Refill  . aspirin 325 MG tablet Take 325 mg by mouth daily.    Marland Kitchen atorvastatin (LIPITOR) 40 MG tablet Take 40 mg by mouth daily.    . metFORMIN (GLUMETZA) 1000 MG (MOD) 24 hr tablet Take 1,000 mg by mouth 2 (two) times daily  with a meal.    . nitroGLYCERIN (NITROSTAT) 0.4 MG SL tablet Place 1 tablet (0.4 mg total) under the tongue as directed. 25 tablet 6  . ramipril (ALTACE) 10 MG capsule Take 10 mg by mouth daily.     No current facility-administered medications for this visit.    Allergies:   Review of patient's allergies indicates no known allergies.    ROS:  Please see the history of present illness.   Otherwise, review of systems are positive for none.   All other systems are reviewed and negative.    PHYSICAL EXAM: VS:  BP 138/68 mmHg  Pulse 58  Ht 5\' 11"  (1.803 m)  Wt 186 lb 4.8 oz (84.505 kg)  BMI 26.00 kg/m2 , BMI Body mass index is 26 kg/(m^2). GENERAL:  Well appearing HEENT:  Pupils equal round and reactive, fundi not visualized, oral mucosa unremarkable NECK:  No jugular venous distention, waveform within normal limits, carotid upstroke brisk and symmetric, no bruits, no thyromegaly LYMPHATICS:  No cervical, inguinal adenopathy LUNGS:  Clear to auscultation bilaterally BACK:  No CVA tenderness CHEST:  Unremarkable HEART:  PMI not displaced or sustained,S1 and S2 within normal limits, no S3, no S4, no clicks, no rubs,  No murmurs ABD:  Flat, positive bowel sounds normal in frequency in pitch, no  bruits, no rebound, no guarding, no midline pulsatile mass, no hepatomegaly, no splenomegaly EXT:  2 plus pulses throughout, no edema, no cyanosis no clubbing SKIN:  No rashes no nodules NEURO:  Cranial nerves II through XII grossly intact, motor grossly intact throughout PSYCH:  Cognitively intact, oriented to person place and time    EKG:  EKG is ordered today. The ekg ordered today demonstrsinus rhythm, rate 58, axis within normal limits, intervals within normal limits, T wave inversions somewhat for rest compared with previous    Recent Labs: No results found for requested labs within last 365 days.    Lipid Panel     Wt Readings from Last 3 Encounters:  04/10/15 186 lb 4.8 oz  (84.505 kg)  05/30/12 187 lb 3.2 oz (84.913 kg)  03/10/10 187 lb (84.823 kg)      Other studies Reviewed: Additional studies/ records that were reviewed today include: . Review of the above records demonstrates:  Please see elsewhere in the note.     ASSESSMENT AND PLAN:  CAD Given his symptoms  I would like to screen with a stress test. I need the added sensitivity of perfusion imaging and so I will perform exercise perfusion study.   Of note he can reduce his aspirin to 81 mg  HTN The blood pressure is at target. No change in medications is indicated. We will continue with therapeutic lifestyle changes (TLC).  Hyperlipidemia I was able to get his most recent blood work from his primary provider LDL of 116 anHDL of 34.   I will suggest that he increase his Lipitor to 80 mg.   Current medicines are reviewed at length with the patient today.  The patient does not have concerns regarding medicines.  The following changes have been made:  As above  Labs/ tests ordered today include:   Orders Placed This Encounter  Procedures  . Myocardial Perfusion Imaging  . EKG 12-Lead     Disposition:   FU with me in 4 months.      Signed, Rollene Rotunda, MD  04/10/2015 9:26 AM    [facility]

## 2015-04-16 ENCOUNTER — Telehealth (HOSPITAL_COMMUNITY): Payer: Self-pay

## 2015-04-16 NOTE — Telephone Encounter (Signed)
Encounter complete. 

## 2015-04-22 ENCOUNTER — Ambulatory Visit (HOSPITAL_COMMUNITY)
Admission: RE | Admit: 2015-04-22 | Discharge: 2015-04-22 | Disposition: A | Discharge status: Home / Self Care | Payer: Medicare PPO | Source: Ambulatory Visit | Attending: Cardiovascular Disease | Admitting: Cardiovascular Disease

## 2015-04-22 DIAGNOSIS — E119 Type 2 diabetes mellitus without complications: Secondary | ICD-10-CM | POA: Diagnosis not present

## 2015-04-22 DIAGNOSIS — I25118 Atherosclerotic heart disease of native coronary artery with other forms of angina pectoris: Secondary | ICD-10-CM | POA: Insufficient documentation

## 2015-04-22 DIAGNOSIS — I517 Cardiomegaly: Secondary | ICD-10-CM | POA: Diagnosis not present

## 2015-04-22 DIAGNOSIS — F17200 Nicotine dependence, unspecified, uncomplicated: Secondary | ICD-10-CM | POA: Diagnosis not present

## 2015-04-22 DIAGNOSIS — R002 Palpitations: Secondary | ICD-10-CM | POA: Insufficient documentation

## 2015-04-22 DIAGNOSIS — R9439 Abnormal result of other cardiovascular function study: Secondary | ICD-10-CM | POA: Insufficient documentation

## 2015-04-22 DIAGNOSIS — I10 Essential (primary) hypertension: Secondary | ICD-10-CM | POA: Diagnosis not present

## 2015-04-22 DIAGNOSIS — R072 Precordial pain: Secondary | ICD-10-CM

## 2015-04-22 LAB — MYOCARDIAL PERFUSION IMAGING
LV dias vol: 137 mL
LV sys vol: 79 mL
Peak HR: 94 {beats}/min
Rest HR: 53 {beats}/min
SDS: 3
SRS: 12
SSS: 15
TID: 1.35

## 2015-04-22 MED ORDER — TECHNETIUM TC 99M SESTAMIBI GENERIC - CARDIOLITE
10.9000 | Freq: Once | INTRAVENOUS | Status: AC | PRN
  Administered 2015-04-22: 10.9 via INTRAVENOUS

## 2015-04-22 MED ORDER — REGADENOSON 0.4 MG/5ML IV SOLN
0.4000 mg | Freq: Once | INTRAVENOUS | Status: AC
  Administered 2015-04-22: 0.4 mg via INTRAVENOUS

## 2015-04-22 MED ORDER — TECHNETIUM TC 99M SESTAMIBI GENERIC - CARDIOLITE
31.9000 | Freq: Once | INTRAVENOUS | Status: AC | PRN
  Administered 2015-04-22: 31.9 via INTRAVENOUS

## 2015-04-23 DIAGNOSIS — N182 Chronic kidney disease, stage 2 (mild): Secondary | ICD-10-CM | POA: Diagnosis not present

## 2015-04-23 DIAGNOSIS — E1122 Type 2 diabetes mellitus with diabetic chronic kidney disease: Secondary | ICD-10-CM | POA: Diagnosis not present

## 2015-04-23 DIAGNOSIS — I10 Essential (primary) hypertension: Secondary | ICD-10-CM | POA: Diagnosis not present

## 2015-04-27 ENCOUNTER — Other Ambulatory Visit: Payer: Self-pay | Admitting: Cardiology

## 2015-04-29 NOTE — Telephone Encounter (Signed)
Rx request sent to pharmacy.  

## 2015-05-09 ENCOUNTER — Telehealth: Payer: Self-pay | Admitting: Cardiology

## 2015-05-09 DIAGNOSIS — R0602 Shortness of breath: Secondary | ICD-10-CM

## 2015-05-09 DIAGNOSIS — R079 Chest pain, unspecified: Secondary | ICD-10-CM

## 2015-05-09 NOTE — Telephone Encounter (Signed)
New message ° ° °Patient returning call back to nurse.  °

## 2015-05-16 ENCOUNTER — Telehealth: Payer: Self-pay | Admitting: Cardiology

## 2015-05-16 ENCOUNTER — Emergency Department (HOSPITAL_COMMUNITY): Payer: Medicare Other

## 2015-05-16 ENCOUNTER — Inpatient Hospital Stay (HOSPITAL_COMMUNITY)
Admission: EM | Admit: 2015-05-16 | Discharge: 2015-05-18 | DRG: 247 | Disposition: A | Discharge status: Home / Self Care | Payer: Medicare Other | Attending: Internal Medicine | Admitting: Internal Medicine

## 2015-05-16 ENCOUNTER — Encounter (HOSPITAL_COMMUNITY)
Admission: EM | Disposition: A | Discharge status: Home / Self Care | Payer: Self-pay | Source: Home / Self Care | Attending: Internal Medicine

## 2015-05-16 ENCOUNTER — Encounter (HOSPITAL_COMMUNITY): Payer: Self-pay | Admitting: *Deleted

## 2015-05-16 DIAGNOSIS — E119 Type 2 diabetes mellitus without complications: Secondary | ICD-10-CM | POA: Diagnosis present

## 2015-05-16 DIAGNOSIS — R7989 Other specified abnormal findings of blood chemistry: Secondary | ICD-10-CM

## 2015-05-16 DIAGNOSIS — I10 Essential (primary) hypertension: Secondary | ICD-10-CM

## 2015-05-16 DIAGNOSIS — R778 Other specified abnormalities of plasma proteins: Secondary | ICD-10-CM

## 2015-05-16 DIAGNOSIS — Z96612 Presence of left artificial shoulder joint: Secondary | ICD-10-CM | POA: Diagnosis not present

## 2015-05-16 DIAGNOSIS — T82855A Stenosis of coronary artery stent, initial encounter: Secondary | ICD-10-CM | POA: Diagnosis present

## 2015-05-16 DIAGNOSIS — E785 Hyperlipidemia, unspecified: Secondary | ICD-10-CM | POA: Diagnosis present

## 2015-05-16 DIAGNOSIS — Z7982 Long term (current) use of aspirin: Secondary | ICD-10-CM | POA: Diagnosis not present

## 2015-05-16 DIAGNOSIS — I25110 Atherosclerotic heart disease of native coronary artery with unstable angina pectoris: Secondary | ICD-10-CM

## 2015-05-16 DIAGNOSIS — I119 Hypertensive heart disease without heart failure: Secondary | ICD-10-CM | POA: Diagnosis present

## 2015-05-16 DIAGNOSIS — Z7984 Long term (current) use of oral hypoglycemic drugs: Secondary | ICD-10-CM | POA: Diagnosis not present

## 2015-05-16 DIAGNOSIS — I2109 ST elevation (STEMI) myocardial infarction involving other coronary artery of anterior wall: Secondary | ICD-10-CM | POA: Diagnosis present

## 2015-05-16 DIAGNOSIS — Z79899 Other long term (current) drug therapy: Secondary | ICD-10-CM

## 2015-05-16 DIAGNOSIS — I2510 Atherosclerotic heart disease of native coronary artery without angina pectoris: Secondary | ICD-10-CM | POA: Diagnosis present

## 2015-05-16 DIAGNOSIS — F17290 Nicotine dependence, other tobacco product, uncomplicated: Secondary | ICD-10-CM | POA: Diagnosis present

## 2015-05-16 DIAGNOSIS — Z955 Presence of coronary angioplasty implant and graft: Secondary | ICD-10-CM | POA: Diagnosis not present

## 2015-05-16 DIAGNOSIS — Z8614 Personal history of Methicillin resistant Staphylococcus aureus infection: Secondary | ICD-10-CM | POA: Diagnosis not present

## 2015-05-16 DIAGNOSIS — I255 Ischemic cardiomyopathy: Secondary | ICD-10-CM

## 2015-05-16 DIAGNOSIS — I214 Non-ST elevation (NSTEMI) myocardial infarction: Secondary | ICD-10-CM | POA: Diagnosis present

## 2015-05-16 DIAGNOSIS — N289 Disorder of kidney and ureter, unspecified: Secondary | ICD-10-CM | POA: Diagnosis present

## 2015-05-16 DIAGNOSIS — F17200 Nicotine dependence, unspecified, uncomplicated: Secondary | ICD-10-CM | POA: Diagnosis present

## 2015-05-16 HISTORY — DX: Ischemic cardiomyopathy: I25.5

## 2015-05-16 HISTORY — PX: CARDIAC CATHETERIZATION: SHX172

## 2015-05-16 HISTORY — DX: Tobacco use: Z72.0

## 2015-05-16 LAB — CBC
HCT: 36.5 % — ABNORMAL LOW (ref 39.0–52.0)
Hemoglobin: 12.4 g/dL — ABNORMAL LOW (ref 13.0–17.0)
MCH: 30.3 pg (ref 26.0–34.0)
MCHC: 34 g/dL (ref 30.0–36.0)
MCV: 89.2 fL (ref 78.0–100.0)
Platelets: 236 10*3/uL (ref 150–400)
RBC: 4.09 MIL/uL — ABNORMAL LOW (ref 4.22–5.81)
RDW: 12.9 % (ref 11.5–15.5)
WBC: 6.9 10*3/uL (ref 4.0–10.5)

## 2015-05-16 LAB — BASIC METABOLIC PANEL
Anion gap: 14 (ref 5–15)
BUN: 17 mg/dL (ref 6–20)
CO2: 21 mmol/L — ABNORMAL LOW (ref 22–32)
Calcium: 9.1 mg/dL (ref 8.9–10.3)
Chloride: 107 mmol/L (ref 101–111)
Creatinine, Ser: 1.7 mg/dL — ABNORMAL HIGH (ref 0.61–1.24)
GFR calc Af Amer: 45 mL/min — ABNORMAL LOW (ref >60)
GFR calc non Af Amer: 38 mL/min — ABNORMAL LOW (ref >60)
Glucose, Bld: 265 mg/dL — ABNORMAL HIGH (ref 65–99)
Potassium: 3.8 mmol/L (ref 3.5–5.1)
Sodium: 142 mmol/L (ref 135–145)

## 2015-05-16 LAB — MAGNESIUM
Magnesium: 1.5 mg/dL — ABNORMAL LOW (ref 1.7–2.4)
Magnesium: 1.4 mg/dL — ABNORMAL LOW (ref 1.7–2.4)

## 2015-05-16 LAB — T4, FREE: Free T4: 0.8 ng/dL (ref 0.61–1.12)

## 2015-05-16 LAB — TSH: TSH: 2.779 u[IU]/mL (ref 0.350–4.500)

## 2015-05-16 LAB — PROTIME-INR
INR: 1.03 (ref 0.00–1.49)
INR: 1.25 (ref 0.00–1.49)
Prothrombin Time: 13.7 seconds (ref 11.6–15.2)
Prothrombin Time: 15.9 seconds — ABNORMAL HIGH (ref 11.6–15.2)

## 2015-05-16 LAB — APTT: aPTT: 27 seconds (ref 24–37)

## 2015-05-16 LAB — I-STAT TROPONIN, ED: Troponin i, poc: 2.7 ng/mL (ref 0.00–0.08)

## 2015-05-16 LAB — MRSA PCR SCREENING: MRSA by PCR: NEGATIVE

## 2015-05-16 LAB — GLUCOSE, CAPILLARY: Glucose-Capillary: 164 mg/dL — ABNORMAL HIGH (ref 65–99)

## 2015-05-16 LAB — BRAIN NATRIURETIC PEPTIDE: B Natriuretic Peptide: 138.4 pg/mL — ABNORMAL HIGH (ref 0.0–100.0)

## 2015-05-16 LAB — TROPONIN I
Troponin I: 3.35 ng/mL (ref <0.031)
Troponin I: 13.32 ng/mL (ref <0.031)

## 2015-05-16 SURGERY — LEFT HEART CATH AND CORONARY ANGIOGRAPHY
Anesthesia: LOCAL

## 2015-05-16 MED ORDER — ACETAMINOPHEN 325 MG PO TABS
650.0000 mg | ORAL_TABLET | ORAL | Status: DC | PRN
  Administered 2015-05-17: 650 mg via ORAL
  Filled 2015-05-16: qty 2

## 2015-05-16 MED ORDER — ALPRAZOLAM 0.25 MG PO TABS: 0.2500 mg | ORAL_TABLET | Freq: Two times a day (BID) | ORAL | Status: DC | PRN

## 2015-05-16 MED ORDER — FENTANYL CITRATE (PF) 100 MCG/2ML IJ SOLN
INTRAMUSCULAR | Status: AC
  Filled 2015-05-16: qty 2

## 2015-05-16 MED ORDER — VERAPAMIL HCL 2.5 MG/ML IV SOLN
INTRAVENOUS | Status: DC | PRN
  Administered 2015-05-16: 19:00:00 via INTRA_ARTERIAL

## 2015-05-16 MED ORDER — SODIUM CHLORIDE 0.9 % IJ SOLN
3.0000 mL | Freq: Two times a day (BID) | INTRAMUSCULAR | Status: DC
Start: 2015-05-16 — End: 2015-05-17

## 2015-05-16 MED ORDER — ATORVASTATIN CALCIUM 80 MG PO TABS
80.0000 mg | ORAL_TABLET | Freq: Every day | ORAL | Status: DC
  Administered 2015-05-16 – 2015-05-17 (×2): 80 mg via ORAL
  Filled 2015-05-16 (×2): qty 1

## 2015-05-16 MED ORDER — NITROGLYCERIN 1 MG/10 ML FOR IR/CATH LAB
INTRA_ARTERIAL | Status: AC
  Filled 2015-05-16: qty 10

## 2015-05-16 MED ORDER — METOPROLOL TARTRATE 25 MG PO TABS
25.0000 mg | ORAL_TABLET | Freq: Two times a day (BID) | ORAL | Status: DC
  Administered 2015-05-16 – 2015-05-18 (×3): 25 mg via ORAL
  Filled 2015-05-16 (×4): qty 1

## 2015-05-16 MED ORDER — NITROGLYCERIN 0.4 MG SL SUBL: 0.4000 mg | SUBLINGUAL_TABLET | SUBLINGUAL | Status: DC | PRN

## 2015-05-16 MED ORDER — VERAPAMIL HCL 2.5 MG/ML IV SOLN
INTRAVENOUS | Status: AC
  Filled 2015-05-16: qty 2

## 2015-05-16 MED ORDER — ASPIRIN 300 MG RE SUPP: 300.0000 mg | RECTAL | Status: AC

## 2015-05-16 MED ORDER — SODIUM CHLORIDE 0.9 % WEIGHT BASED INFUSION: 3.0000 mL/kg/h | INTRAVENOUS | Status: DC

## 2015-05-16 MED ORDER — INSULIN ASPART 100 UNIT/ML ~~LOC~~ SOLN: 0.0000 [IU] | Freq: Every day | SUBCUTANEOUS | Status: DC

## 2015-05-16 MED ORDER — TICAGRELOR 90 MG PO TABS
ORAL_TABLET | ORAL | Status: DC | PRN
  Administered 2015-05-16: 180 mg via ORAL

## 2015-05-16 MED ORDER — SODIUM CHLORIDE 0.9 % IV SOLN
INTRAVENOUS | Status: DC
  Administered 2015-05-16 – 2015-05-17 (×2): via INTRAVENOUS
  Administered 2015-05-17: 1000 mL via INTRAVENOUS

## 2015-05-16 MED ORDER — FENTANYL CITRATE (PF) 100 MCG/2ML IJ SOLN
INTRAMUSCULAR | Status: DC | PRN
  Administered 2015-05-16: 25 ug via INTRAVENOUS

## 2015-05-16 MED ORDER — HEPARIN (PORCINE) IN NACL 100-0.45 UNIT/ML-% IJ SOLN
1100.0000 [IU]/h | INTRAMUSCULAR | Status: DC
  Administered 2015-05-16: 1100 [IU]/h via INTRAVENOUS
  Filled 2015-05-16: qty 250

## 2015-05-16 MED ORDER — RAMIPRIL 5 MG PO CAPS
10.0000 mg | ORAL_CAPSULE | Freq: Every day | ORAL | Status: DC
  Administered 2015-05-17 – 2015-05-18 (×2): 10 mg via ORAL
  Filled 2015-05-16: qty 1
  Filled 2015-05-16: qty 2

## 2015-05-16 MED ORDER — ASPIRIN 81 MG PO TABS: 81.0000 mg | ORAL_TABLET | Freq: Every day | ORAL | Status: DC | PRN

## 2015-05-16 MED ORDER — HEPARIN SODIUM (PORCINE) 1000 UNIT/ML IJ SOLN
INTRAMUSCULAR | Status: AC
  Filled 2015-05-16: qty 1

## 2015-05-16 MED ORDER — SODIUM CHLORIDE 0.9 % IJ SOLN
3.0000 mL | INTRAMUSCULAR | Status: DC | PRN
Start: 2015-05-16 — End: 2015-05-17

## 2015-05-16 MED ORDER — ASPIRIN 81 MG PO CHEW: 81.0000 mg | CHEWABLE_TABLET | ORAL | Status: DC

## 2015-05-16 MED ORDER — ONDANSETRON HCL 4 MG/2ML IJ SOLN: 4.0000 mg | Freq: Four times a day (QID) | INTRAMUSCULAR | Status: DC | PRN

## 2015-05-16 MED ORDER — MIDAZOLAM HCL 2 MG/2ML IJ SOLN
INTRAMUSCULAR | Status: AC
  Filled 2015-05-16: qty 2

## 2015-05-16 MED ORDER — MIDAZOLAM HCL 2 MG/2ML IJ SOLN
INTRAMUSCULAR | Status: DC | PRN
  Administered 2015-05-16: 2 mg via INTRAVENOUS

## 2015-05-16 MED ORDER — NITROGLYCERIN 1 MG/10 ML FOR IR/CATH LAB
INTRA_ARTERIAL | Status: DC | PRN
  Administered 2015-05-16: 150 ug via INTRACORONARY
  Administered 2015-05-16: 200 ug via INTRACORONARY
  Administered 2015-05-16: 150 ug via INTRACORONARY

## 2015-05-16 MED ORDER — NITROGLYCERIN IN D5W 200-5 MCG/ML-% IV SOLN: 5.0000 ug/min | INTRAVENOUS | Status: DC

## 2015-05-16 MED ORDER — HEPARIN SODIUM (PORCINE) 1000 UNIT/ML IJ SOLN
INTRAMUSCULAR | Status: DC | PRN
  Administered 2015-05-16 (×2): 5000 [IU] via INTRAVENOUS
  Administered 2015-05-16: 3000 [IU] via INTRAVENOUS

## 2015-05-16 MED ORDER — SODIUM CHLORIDE 0.9 % IV SOLN
INTRAVENOUS | Status: DC | PRN
  Administered 2015-05-16: 20:00:00
  Administered 2015-05-16: 84 mL via INTRAVENOUS

## 2015-05-16 MED ORDER — ASPIRIN 81 MG PO CHEW: 324.0000 mg | CHEWABLE_TABLET | ORAL | Status: AC

## 2015-05-16 MED ORDER — IOHEXOL 350 MG/ML SOLN
INTRAVENOUS | Status: DC | PRN
  Administered 2015-05-16: 125 mL via INTRA_ARTERIAL

## 2015-05-16 MED ORDER — INSULIN ASPART 100 UNIT/ML ~~LOC~~ SOLN
0.0000 [IU] | Freq: Three times a day (TID) | SUBCUTANEOUS | Status: DC
  Administered 2015-05-17: 1 [IU] via SUBCUTANEOUS
  Administered 2015-05-17 – 2015-05-18 (×3): 2 [IU] via SUBCUTANEOUS

## 2015-05-16 MED ORDER — HEPARIN BOLUS VIA INFUSION
4000.0000 [IU] | Freq: Once | INTRAVENOUS | Status: AC
  Administered 2015-05-16: 4000 [IU] via INTRAVENOUS
  Filled 2015-05-16: qty 4000

## 2015-05-16 MED ORDER — TICAGRELOR 90 MG PO TABS
ORAL_TABLET | ORAL | Status: AC
  Filled 2015-05-16: qty 1

## 2015-05-16 MED ORDER — LIDOCAINE HCL (PF) 1 % IJ SOLN
INTRAMUSCULAR | Status: AC
  Filled 2015-05-16: qty 30

## 2015-05-16 MED ORDER — MAGNESIUM SULFATE 2 GM/50ML IV SOLN
2.0000 g | Freq: Once | INTRAVENOUS | Status: AC
  Administered 2015-05-17: 2 g via INTRAVENOUS
  Filled 2015-05-16: qty 50

## 2015-05-16 MED ORDER — HEPARIN (PORCINE) IN NACL 2-0.9 UNIT/ML-% IJ SOLN
INTRAMUSCULAR | Status: AC
  Filled 2015-05-16: qty 1000

## 2015-05-16 MED ORDER — NITROGLYCERIN 2 % TD OINT
1.0000 [in_us] | TOPICAL_OINTMENT | Freq: Once | TRANSDERMAL | Status: AC
Start: 2015-05-16 — End: 2015-05-16
  Administered 2015-05-16: 1 [in_us] via TOPICAL
  Filled 2015-05-16: qty 1

## 2015-05-16 MED ORDER — HEPARIN (PORCINE) IN NACL 2-0.9 UNIT/ML-% IJ SOLN
INTRAMUSCULAR | Status: DC | PRN
  Administered 2015-05-16: 20:00:00

## 2015-05-16 MED ORDER — SODIUM CHLORIDE 0.9 % IV SOLN: 250.0000 mL | INTRAVENOUS | Status: DC | PRN

## 2015-05-16 MED ORDER — SODIUM CHLORIDE 0.9 % WEIGHT BASED INFUSION: 1.0000 mL/kg/h | INTRAVENOUS | Status: DC

## 2015-05-16 MED ORDER — ZOLPIDEM TARTRATE 5 MG PO TABS: 5.0000 mg | ORAL_TABLET | Freq: Every evening | ORAL | Status: DC | PRN

## 2015-05-16 MED ORDER — ASPIRIN 81 MG PO CHEW
324.0000 mg | CHEWABLE_TABLET | Freq: Once | ORAL | Status: AC
  Administered 2015-05-16: 324 mg via ORAL
  Filled 2015-05-16: qty 4

## 2015-05-16 MED ORDER — ASPIRIN EC 81 MG PO TBEC
81.0000 mg | DELAYED_RELEASE_TABLET | Freq: Every day | ORAL | Status: DC
  Administered 2015-05-17 – 2015-05-18 (×2): 81 mg via ORAL
  Filled 2015-05-16 (×2): qty 1

## 2015-05-16 SURGICAL SUPPLY — 25 items
BALLN EUPHORA RX 2.0X12 (BALLOONS) ×2
BALLN ~~LOC~~ EMERGE MR 2.75X15 (BALLOONS) ×2
BALLN ~~LOC~~ EMERGE MR 3.0X8 (BALLOONS) ×2
BALLN ~~LOC~~ EMERGE MR 3.25X12 (BALLOONS) ×2
BALLOON EUPHORA RX 2.0X12 (BALLOONS) ×1 IMPLANT
BALLOON ~~LOC~~ EMERGE MR 2.75X15 (BALLOONS) ×1 IMPLANT
BALLOON ~~LOC~~ EMERGE MR 3.0X8 (BALLOONS) ×1 IMPLANT
BALLOON ~~LOC~~ EMERGE MR 3.25X12 (BALLOONS) ×1 IMPLANT
CATH INFINITI 5 FR JL3.5 (CATHETERS) ×2 IMPLANT
CATH INFINITI 5FR ANG PIGTAIL (CATHETERS) ×2 IMPLANT
CATH INFINITI JR4 5F (CATHETERS) ×2 IMPLANT
CATH VISTA GUIDE 6FR XBLAD3.5 (CATHETERS) ×2 IMPLANT
DEVICE RAD COMP TR BAND LRG (VASCULAR PRODUCTS) ×2 IMPLANT
GLIDESHEATH SLEND SS 6F .021 (SHEATH) ×2 IMPLANT
KIT ENCORE 26 ADVANTAGE (KITS) ×2 IMPLANT
KIT HEART LEFT (KITS) ×2 IMPLANT
PACK CARDIAC CATHETERIZATION (CUSTOM PROCEDURE TRAY) ×2 IMPLANT
STENT PROMUS PREM MR 2.5X20 (Permanent Stent) ×2 IMPLANT
STENT PROMUS PREM MR 3.0X20 (Permanent Stent) ×2 IMPLANT
SYR MEDRAD MARK V 150ML (SYRINGE) ×2 IMPLANT
TRANSDUCER W/STOPCOCK (MISCELLANEOUS) ×2 IMPLANT
TUBING CIL FLEX 10 FLL-RA (TUBING) ×2 IMPLANT
WIRE COUGAR XT STRL 190CM (WIRE) ×2 IMPLANT
WIRE HI TORQ VERSACORE-J 145CM (WIRE) ×2 IMPLANT
WIRE SAFE-T 1.5MM-J .035X260CM (WIRE) ×2 IMPLANT

## 2015-05-16 NOTE — Interval H&P Note (Signed)
Cath Lab Visit (complete for each Cath Lab visit)  Clinical Evaluation Leading to the Procedure:   ACS: Yes.    Non-ACS:    Anginal Classification: CCS IV  Anti-ischemic medical therapy: Minimal Therapy (1 class of medications)  Non-Invasive Test Results: No non-invasive testing performed  Prior CABG: No previous CABG      History and Physical Interval Note:  05/16/2015 6:48 PM  [name]  has presented today for surgery, with the diagnosis of nstemi  The various methods of treatment have been discussed with the patient and family. After consideration of risks, benefits and other options for treatment, the patient has consented to  Procedure(s): Left Heart Cath and Coronary Angiography (N/A) as a surgical intervention .  The patient's history has been reviewed, patient examined, no change in status, stable for surgery.  I have reviewed the patient's chart and labs.  Questions were answered to the patient's satisfaction.     Tonny Bollman

## 2015-05-16 NOTE — Progress Notes (Signed)
ANTICOAGULATION CONSULT NOTE - Initial Consult  Pharmacy Consult for heparin  Indication: chest pain/ACS  No Known Allergies  Patient Measurements:    Weight: 84 kg  Vital Signs: Temp: 97.7 F (36.5 C) (01/20 1412) Temp Source: Oral (01/20 1412) BP: 165/93 mmHg (01/20 1412) Pulse Rate: 74 (01/20 1412)  Labs:  Recent Labs  05/16/15 1413  HGB 12.4*  HCT 36.5*  PLT 236  CREATININE 1.70*    CrCl cannot be calculated (Unknown ideal weight.).   Medical History: Past Medical History  Diagnosis Date  . Coronary artery disease     2007 distal LAD occlusion medically managed.  80% LAD stenosis with Promus 2.75 by 18 stent. 2011  . Hx MRSA infection   . Diabetes mellitus without complication (HCC)   . Dyslipidemia    Assessment: 75 yo M with CP to start heparin per pharmacy for ACS/STEMI.  Wt 84 kg.  Hg 12.4, PLTC 236.   First troponin 2.7  Goal of Therapy:  Heparin level 0.3-0.7 units/ml Monitor platelets by anticoagulation protocol: Yes   Plan:  Give 4000 units bolus x 1 Start heparin infusion at 1100 units/hr Check anti-Xa level in 8 hours and daily while on heparin Continue to monitor H&H and platelets  Herby Abraham, Pharm.D. [phone] 05/16/2015 3:09 PM

## 2015-05-16 NOTE — ED Provider Notes (Signed)
CSN: [id number]     Arrival date & time 05/16/15  1401 History   First MD Initiated Contact with Patient 05/16/15 1450     Chief Complaint  Patient presents with  . Chest Pain     (Consider location/radiation/quality/duration/timing/severity/associated sxs/prior Treatment) Patient is a 75 y.o. male presenting with chest pain. The history is provided by the patient.  Chest Pain Pain location:  Substernal area Pain quality: pressure   Pain radiates to:  R arm Pain radiates to the back: no   Pain severity:  Moderate Onset quality:  Gradual Duration:  1 day Timing:  Constant (cleaning the yard in afternoon, had pain until falling asleep at 2 am with active pain, woke up with less pain, resolved in the last 20 minutes) Progression:  Resolved Chronicity:  New Context: at rest   Relieved by:  Nothing Worsened by:  Nothing tried Ineffective treatments: 23x NTG tablets over 9 hours last night/evening without relief. Associated symptoms: cough and diaphoresis   Associated symptoms: no abdominal pain, no fever, no lower extremity edema, no nausea, no shortness of breath and not vomiting   Risk factors: coronary artery disease, diabetes mellitus, high cholesterol and smoking (4 cigars/day)     Past Medical History  Diagnosis Date  . Coronary artery disease     2007 distal LAD occlusion medically managed.  80% LAD stenosis with Promus 2.75 by 18 stent. 2011  . Hx MRSA infection   . Diabetes mellitus without complication (HCC)   . Dyslipidemia    Past Surgical History  Procedure Laterality Date  . Total shoulder replacement      Right   History reviewed. No pertinent family history. Social History  Substance Use Topics  . Smoking status: Current Every Day Smoker  . Smokeless tobacco: None  . Alcohol Use: None    Review of Systems  Constitutional: Positive for diaphoresis. Negative for fever.  Respiratory: Positive for cough. Negative for shortness of breath.   Cardiovascular:  Positive for chest pain.  Gastrointestinal: Negative for nausea, vomiting and abdominal pain.  All other systems reviewed and are negative.     Allergies  Review of patient's allergies indicates no known allergies.  Home Medications   Prior to Admission medications   Medication Sig Start Date End Date Taking? Authorizing Provider  aspirin 81 MG tablet Take 81 mg by mouth daily.    Historical Provider, MD  atorvastatin (LIPITOR) 80 MG tablet Take 1 tablet (80 mg total) by mouth daily. 04/10/15   Rollene Rotunda, MD  metFORMIN (GLUMETZA) 1000 MG (MOD) 24 hr tablet Take 1,000 mg by mouth 2 (two) times daily with a meal.    Historical Provider, MD  nitroGLYCERIN (NITROSTAT) 0.4 MG SL tablet PLACE 1 TABLET UNDER THE TONGUE AS DIRECTED. 04/29/15   Rollene Rotunda, MD  ramipril (ALTACE) 10 MG capsule Take 10 mg by mouth daily.    Historical Provider, MD   BP 165/93 mmHg  Pulse 74  Temp(Src) 97.7 F (36.5 C) (Oral)  Resp 18  SpO2 96% Physical Exam  Constitutional: He is oriented to person, place, and time. He appears well-developed and well-nourished. No distress.  HENT:  Head: Normocephalic and atraumatic.  Eyes: Conjunctivae are normal.  Neck: Neck supple. No tracheal deviation present.  Cardiovascular: Normal rate, regular rhythm and normal heart sounds.  Exam reveals no friction rub.   No murmur heard. Pulmonary/Chest: Effort normal and breath sounds normal. No respiratory distress.  Abdominal: Soft. He exhibits no distension. There is no  tenderness.  Neurological: He is alert and oriented to person, place, and time.  Skin: Skin is warm and dry.  Psychiatric: He has a normal mood and affect.  Vitals reviewed.   ED Course  Procedures (including critical care time) CRITICAL CARE Performed by: Lyndal Pulley Total critical care time: 30 minutes Critical care time was exclusive of separately billable procedures and treating other patients. Critical care was necessary to treat or  prevent imminent or life-threatening deterioration. Critical care was time spent personally by me on the following activities: development of treatment plan with patient and/or surrogate as well as nursing, discussions with consultants, evaluation of patient's response to treatment, examination of patient, obtaining history from patient or surrogate, ordering and performing treatments and interventions, ordering and review of laboratory studies, ordering and review of radiographic studies, pulse oximetry and re-evaluation of patient's condition.  Labs Review Labs Reviewed  BASIC METABOLIC PANEL - Abnormal; Notable for the following:    CO2 21 (*)    Glucose, Bld 265 (*)    Creatinine, Ser 1.70 (*)    GFR calc non Af Amer 38 (*)    GFR calc Af Amer 45 (*)    All other components within normal limits  CBC - Abnormal; Notable for the following:    RBC 4.09 (*)    Hemoglobin 12.4 (*)    HCT 36.5 (*)    All other components within normal limits  I-STAT TROPOININ, ED - Abnormal; Notable for the following:    Troponin i, poc 2.70 (*)    All other components within normal limits  APTT  PROTIME-INR  MAGNESIUM  TROPONIN I  HEPARIN LEVEL (UNFRACTIONATED)    Imaging Review Dg Chest 2 View  05/16/2015  CLINICAL DATA:  Chest pain. EXAM: CHEST  2 VIEW COMPARISON:  02/21/2010 and 08/10/2006 FINDINGS: Heart size and pulmonary vascularity are normal and the lungs are clear. No effusions. No acute osseous abnormality. Right proximal humeral prosthesis. IMPRESSION: No active cardiopulmonary disease. Electronically Signed   By: Francene Boyers M.D.   On: 05/16/2015 14:43   I have personally reviewed and evaluated these images and lab results as part of my medical decision-making.   EKG Interpretation   Date/Time:  Friday May 16 2015 14:10:47 EST Ventricular Rate:  79 PR Interval:  138 QRS Duration: 72 QT Interval:  378 QTC Calculation: 433 R Axis:   73 Text Interpretation:  Normal sinus  rhythm Nonspecific T wave abnormality  now evident in Inferior leads Confirmed by KNOTT MD, Reuel Boom ([id number]) on  05/16/2015 3:03:25 PM      MDM   Final diagnoses:  NSTEMI (non-ST elevated myocardial infarction) (HCC)  Troponin level elevated    75 y.o. male presents with chest pain starting yesterday in the afternoon when doing yard work. He described the pain as pressure over the central part of his chest that radiated intermittently to his right arm. He had been previously prescribed nitroglycerin and he took pills intermittently over 9 hours into the evening and overnight 21 with no relief and went to bed with pain. He awoke today with pain improved from the night before but persistent. On arrival to emergency department his pain is completely resolved without any intervention. He was given 324 mg aspirin immediately, started on heparin drip for positive troponin and EKG changes concerning for non-ST elevation MI. Cardiology was consulted for evaluation in the emergency department and will be admitting the patient for definitive management.    Lyndal Pulley, MD 05/16/15 [phone]

## 2015-05-16 NOTE — ED Notes (Signed)
Pt reports intermittent chest pains for extended amount of time, has been seen by pcp and prescribed nitro. Worked out in the yard yesterday and reports increase in pain yesterday and into the night. Reports taking 23 nitro tablets throughout the day yesterday with no relief.

## 2015-05-16 NOTE — ED Notes (Signed)
Troponin of 3.35 reported to MD

## 2015-05-16 NOTE — Telephone Encounter (Signed)
Returned patient call. He reports taking several NTG last night for CP that began with exertion. Pt is still having intermittent pain. Denies SOB.  He was advised to report promptly to closest ED for evaluation. If worse, esp w SOB or referred pain to call 911. Pt voiced understanding.

## 2015-05-16 NOTE — H&P (Signed)
Primary Physician: Primary Cardiologist:   HPI: Pt is a 75 yo who is followed by J Hochrein  Hx of CAD with Promus stent to LAD in 2011  He was seen in cardiology cliic on April 10 2015.  CP atthat time  pressre sensation  Heavy  Lasted about a min  Some help with NTG  Myoview showed large inferior defect consistent with scar and periinfarct ischemia and anterior/apical infarct with mild perinfarct ischemia  LVEF 43%   Echo ordered Not done yet     Pt was cleaning in yard.yeserday  Had pain Pressure  LIke elephant  No SOB  TOok 23 NTG with no relief  Fell asleep at 2 AM  Woke up  Pain less  But still there  Drove self to ER  IN parking lot got  worse  Now 1/10 in severit y   At its peak pain was 9/10     EKG SR 79 bpm   Downsloping ST depression III, AVF, V6  I mm ST elevation in I  This is different from Dec when had lateral T wave inversion   EKG at 17:43:  SR 66  0.5 -1 mm ST elevation with T wave inversion I, AVL.  1 mm ST elevation V2.  T wave inversoino V2-V5  (new)  Initial troponin 2.7  Second troponin  3.35       Past Medical History  Diagnosis Date  . Coronary artery disease     2007 distal LAD occlusion medically managed.  80% LAD stenosis with Promus 2.75 by 18 stent. 2011  . Hx MRSA infection   . Diabetes mellitus without complication (HCC)   . Dyslipidemia      (Not in a hospital admission)  Home meds   ASA 81 mg  Lipitor 40 mg  Metformin 1 g bid  Altace 10 mg qd    Infusions: . heparin 1,100 Units/hr (05/16/15 1523)    No Known Allergies  Social History   Social History  . Marital Status: Legally Separated    Spouse Name: N/A  . Number of Children: N/A  . Years of Education: N/A   Occupational History  . Not on file.   Social History Main Topics  . Smoking status: Current Every Day Smoker  . Smokeless tobacco: Not on file  . Alcohol Use: Not on file  . Drug Use: Not on file  . Sexual Activity: Not on file   Other Topics Concern  .  Not on file   Social History Narrative    History reviewed. No pertinent family history.  REVIEW OF SYSTEMS:  All systems reviewed  Negative to the above problem except as noted above.    PHYSICAL EXAM: Filed Vitals:   05/16/15 1630 05/16/15 1645  BP: 146/87 155/86  Pulse: 72 74  Temp:    Resp: 19 19    No intake or output data in the 24 hours ending 05/16/15 1722  General:  Well appearing. No respiratory difficulty HEENT: normal Neck: supple. no JVD. Carotids 2+ bilat; no bruits. No lymphadenopathy or thryomegaly appreciated. Cor: PMI nondisplaced. Regular rate & rhythm. No rubs, gallops or murmurs. Lungs: clear Abdomen: soft, nontender, nondistended. No hepatosplenomegaly. No bruits or masses. Good bowel sounds. Extremities: no cyanosis, clubbing, rash, edema Neuro: alert & oriented x 3, cranial nerves grossly intact. moves all 4 extremities w/o difficulty. Affect pleasant.  ECG:  As noted above    Results for orders placed or performed during the hospital  encounter of 05/16/15 (from the past 24 hour(s))  Basic metabolic panel     Status: Abnormal   Collection Time: 05/16/15  2:13 PM  Result Value Ref Range   Sodium 142 135 - 145 mmol/L   Potassium 3.8 3.5 - 5.1 mmol/L   Chloride 107 101 - 111 mmol/L   CO2 21 (L) 22 - 32 mmol/L   Glucose, Bld 265 (H) 65 - 99 mg/dL   BUN 17 6 - 20 mg/dL   Creatinine, Ser 2.77 (H) 0.61 - 1.24 mg/dL   Calcium 9.1 8.9 - 41.2 mg/dL   GFR calc non Af Amer 38 (L) >60 mL/min   GFR calc Af Amer 45 (L) >60 mL/min   Anion gap 14 5 - 15  CBC     Status: Abnormal   Collection Time: 05/16/15  2:13 PM  Result Value Ref Range   WBC 6.9 4.0 - 10.5 K/uL   RBC 4.09 (L) 4.22 - 5.81 MIL/uL   Hemoglobin 12.4 (L) 13.0 - 17.0 g/dL   HCT 87.8 (L) 67.6 - 72.0 %   MCV 89.2 78.0 - 100.0 fL   MCH 30.3 26.0 - 34.0 pg   MCHC 34.0 30.0 - 36.0 g/dL   RDW 94.7 09.6 - 28.3 %   Platelets 236 150 - 400 K/uL  I-stat troponin, ED (not at [facility])     Status:  Abnormal   Collection Time: 05/16/15  2:22 PM  Result Value Ref Range   Troponin i, poc 2.70 (HH) 0.00 - 0.08 ng/mL   Comment NOTIFIED PHYSICIAN    Comment 3          Magnesium     Status: Abnormal   Collection Time: 05/16/15  3:39 PM  Result Value Ref Range   Magnesium 1.5 (L) 1.7 - 2.4 mg/dL  APTT     Status: None   Collection Time: 05/16/15  3:39 PM  Result Value Ref Range   aPTT 27 24 - 37 seconds  Protime-INR     Status: None   Collection Time: 05/16/15  3:39 PM  Result Value Ref Range   Prothrombin Time 13.7 11.6 - 15.2 seconds   INR 1.03 0.00 - 1.49  Troponin I     Status: Abnormal   Collection Time: 05/16/15  3:39 PM  Result Value Ref Range   Troponin I 3.35 (HH) <0.031 ng/mL   Dg Chest 2 View  05/16/2015  CLINICAL DATA:  Chest pain. EXAM: CHEST  2 VIEW COMPARISON:  02/21/2010 and 08/10/2006 FINDINGS: Heart size and pulmonary vascularity are normal and the lungs are clear. No effusions. No acute osseous abnormality. Right proximal humeral prosthesis. IMPRESSION: No active cardiopulmonary disease. Electronically Signed   By: Francene Boyers M.D.   On: 05/16/2015 14:43     ASSESSMENT:  75 yo with known CAD  Stent to LAD in 2011  Myoview showed extensive scar in Dec which was very from reported cath Now with CP, severe yesterday  Still not gone completely  EKG shows Q waves in V1  Some ST elevation and ST T wave changes  Worrisome  Concerned that there is more myocardium at risk   I have discussed with [facility]  With ongoing pain would plan urgent cath  On heparin  Pt at sandwich early this AM Admit after cath Risks/ benefits explained  Pt understands and agrees to proceed.    2  HTN  Keep on meds  3.  DM  Hold metformin  4.  HL  Continue statin.     Dietrich Pates

## 2015-05-16 NOTE — Telephone Encounter (Signed)
Pt is having chest pain,had them last night,He said he just about took a whole bottle of nitroglycerin last night.

## 2015-05-17 ENCOUNTER — Inpatient Hospital Stay (HOSPITAL_COMMUNITY): Payer: Medicare Other

## 2015-05-17 DIAGNOSIS — N183 Chronic kidney disease, stage 3 (moderate): Secondary | ICD-10-CM

## 2015-05-17 LAB — BASIC METABOLIC PANEL
Anion gap: 11 (ref 5–15)
Anion gap: 7 (ref 5–15)
BUN: 12 mg/dL (ref 6–20)
BUN: 9 mg/dL (ref 6–20)
CO2: 22 mmol/L (ref 22–32)
CO2: 26 mmol/L (ref 22–32)
Calcium: 8.4 mg/dL — ABNORMAL LOW (ref 8.9–10.3)
Calcium: 8.5 mg/dL — ABNORMAL LOW (ref 8.9–10.3)
Chloride: 109 mmol/L (ref 101–111)
Chloride: 109 mmol/L (ref 101–111)
Creatinine, Ser: 1.35 mg/dL — ABNORMAL HIGH (ref 0.61–1.24)
Creatinine, Ser: 1.34 mg/dL — ABNORMAL HIGH (ref 0.61–1.24)
GFR calc Af Amer: 59 mL/min — ABNORMAL LOW (ref >60)
GFR calc Af Amer: 59 mL/min — ABNORMAL LOW (ref >60)
GFR calc non Af Amer: 51 mL/min — ABNORMAL LOW (ref >60)
GFR calc non Af Amer: 51 mL/min — ABNORMAL LOW (ref >60)
Glucose, Bld: 133 mg/dL — ABNORMAL HIGH (ref 65–99)
Glucose, Bld: 149 mg/dL — ABNORMAL HIGH (ref 65–99)
Potassium: 4 mmol/L (ref 3.5–5.1)
Potassium: 3.8 mmol/L (ref 3.5–5.1)
Sodium: 142 mmol/L (ref 135–145)
Sodium: 142 mmol/L (ref 135–145)

## 2015-05-17 LAB — CBC
HCT: 33.2 % — ABNORMAL LOW (ref 39.0–52.0)
HCT: 34.7 % — ABNORMAL LOW (ref 39.0–52.0)
Hemoglobin: 11.1 g/dL — ABNORMAL LOW (ref 13.0–17.0)
Hemoglobin: 11.7 g/dL — ABNORMAL LOW (ref 13.0–17.0)
MCH: 29.5 pg (ref 26.0–34.0)
MCH: 30.3 pg (ref 26.0–34.0)
MCHC: 33.4 g/dL (ref 30.0–36.0)
MCHC: 33.7 g/dL (ref 30.0–36.0)
MCV: 88.3 fL (ref 78.0–100.0)
MCV: 89.9 fL (ref 78.0–100.0)
Platelets: 201 10*3/uL (ref 150–400)
Platelets: 207 10*3/uL (ref 150–400)
RBC: 3.76 MIL/uL — ABNORMAL LOW (ref 4.22–5.81)
RBC: 3.86 MIL/uL — ABNORMAL LOW (ref 4.22–5.81)
RDW: 12.9 % (ref 11.5–15.5)
RDW: 13.1 % (ref 11.5–15.5)
WBC: 7.2 10*3/uL (ref 4.0–10.5)
WBC: 6.8 10*3/uL (ref 4.0–10.5)

## 2015-05-17 LAB — LIPID PANEL
Cholesterol: 129 mg/dL (ref 0–200)
HDL: 33 mg/dL — ABNORMAL LOW (ref >40)
LDL Cholesterol: 73 mg/dL (ref 0–99)
Total CHOL/HDL Ratio: 3.9 RATIO
Triglycerides: 113 mg/dL (ref <150)
VLDL: 23 mg/dL (ref 0–40)

## 2015-05-17 LAB — TROPONIN I
Troponin I: 14.26 ng/mL (ref <0.031)
Troponin I: 9.89 ng/mL (ref <0.031)

## 2015-05-17 LAB — GLUCOSE, CAPILLARY
Glucose-Capillary: 136 mg/dL — ABNORMAL HIGH (ref 65–99)
Glucose-Capillary: 106 mg/dL — ABNORMAL HIGH (ref 65–99)
Glucose-Capillary: 196 mg/dL — ABNORMAL HIGH (ref 65–99)
Glucose-Capillary: 140 mg/dL — ABNORMAL HIGH (ref 65–99)

## 2015-05-17 LAB — POCT ACTIVATED CLOTTING TIME
Activated Clotting Time: 240 seconds
Activated Clotting Time: 286 seconds

## 2015-05-17 MED ORDER — SODIUM CHLORIDE 0.9 % IJ SOLN: 3.0000 mL | INTRAMUSCULAR | Status: DC | PRN

## 2015-05-17 MED ORDER — HEPARIN SODIUM (PORCINE) 5000 UNIT/ML IJ SOLN
5000.0000 [IU] | Freq: Three times a day (TID) | INTRAMUSCULAR | Status: DC
  Administered 2015-05-17 – 2015-05-18 (×3): 5000 [IU] via SUBCUTANEOUS
  Filled 2015-05-17 (×3): qty 1

## 2015-05-17 MED ORDER — OXYCODONE-ACETAMINOPHEN 5-325 MG PO TABS
1.0000 | ORAL_TABLET | ORAL | Status: DC | PRN
  Administered 2015-05-17: 1 via ORAL
  Filled 2015-05-17: qty 1

## 2015-05-17 MED ORDER — TICAGRELOR 90 MG PO TABS
90.0000 mg | ORAL_TABLET | Freq: Two times a day (BID) | ORAL | Status: DC
  Administered 2015-05-17 – 2015-05-18 (×3): 90 mg via ORAL
  Filled 2015-05-17 (×3): qty 1

## 2015-05-17 MED ORDER — SODIUM CHLORIDE 0.9 % IJ SOLN: 3.0000 mL | Freq: Two times a day (BID) | INTRAMUSCULAR | Status: DC

## 2015-05-17 MED ORDER — SODIUM CHLORIDE 0.9 % IV SOLN
INTRAVENOUS | Status: AC
  Administered 2015-05-17: 03:00:00 via INTRAVENOUS

## 2015-05-17 MED ORDER — SODIUM CHLORIDE 0.9 % IV SOLN: 250.0000 mL | INTRAVENOUS | Status: DC | PRN

## 2015-05-17 NOTE — Progress Notes (Signed)
CARDIAC REHAB PHASE I   PRE:  Rate/Rhythm: 48 SB  BP:  Supine: 139/80  Sitting:   Standing:    SaO2: 100 RA  MODE:  Ambulation: 700 ft   POST:  Rate/Rhythm: 90 SR  BP:  Supine:   Sitting: 137/89  Standing:    SaO2: 100 RA 1315-1430 Pt tolerated ambulation well without c/o of cp or SOB. VS stable Pt to recliner after walk with call light in reach. Completed MI and stent education with pt. He voices understanding. Referral sent to [facility] for Outpt. CRP. He admits to smoking cigars but not routine. He states that he is not going to smoke anymore. I gave him information on smoking cessation and Brilinta card.  Melina Copa RN 05/17/2015 2:34 PM

## 2015-05-17 NOTE — Progress Notes (Signed)
SUBJECTIVE:  No further chest pain. He admits that he has been having on-and-off chest discomfort for several weeks now. No bleeding problems last night.  OBJECTIVE:   Vitals:   Filed Vitals:   05/17/15 0700 05/17/15 0725 05/17/15 1105 05/17/15 1106  BP: 142/79 149/92 129/97   Pulse: 51 51  58  Temp:  97.8 F (36.6 C)    TempSrc:  Oral    Resp: 16 14    Height:      Weight:      SpO2: 99% 99%     I&O's:   Intake/Output Summary (Last 24 hours) at 05/17/15 1119 Last data filed at 05/17/15 0900  Gross per 24 hour  Intake   1675 ml  Output   1050 ml  Net    625 ml   TELEMETRY: Reviewed telemetry pt in normal sinus rhythm:     PHYSICAL EXAM General: Well developed, well nourished, in no acute distress Head:   Normal cephalic and atramatic  Lungs:   Clear bilaterally to auscultation. Heart:   HRRR S1 S2  No JVD.   Abdomen: abdomen soft and non-tender Msk:  Back normal,  Normal strength and tone for age. Extremities:   No edema.  Right radial site intact with palpable pulse. No hematoma Neuro: Alert and oriented. Psych:  Normal affect, responds appropriately Skin: No rash   LABS: Basic Metabolic Panel:  Recent Labs  18/84/16 1539 05/16/15 2115 05/17/15 0222 05/17/15 0745  NA  --   --  142 142  K  --   --  4.0 3.8  CL  --   --  109 109  CO2  --   --  22 26  GLUCOSE  --   --  133* 149*  BUN  --   --  12 9  CREATININE  --   --  1.35* 1.34*  CALCIUM  --   --  8.4* 8.5*  MG 1.5* 1.4*  --   --    Liver Function Tests: No results for input(s): AST, ALT, ALKPHOS, BILITOT, PROT, ALBUMIN in the last 72 hours. No results for input(s): LIPASE, AMYLASE in the last 72 hours. CBC:  Recent Labs  05/17/15 0222 05/17/15 0745  WBC 7.2 6.8  HGB 11.1* 11.7*  HCT 33.2* 34.7*  MCV 88.3 89.9  PLT 201 207   Cardiac Enzymes:  Recent Labs  05/16/15 2115 05/17/15 0222 05/17/15 0745  TROPONINI 13.32* 14.26* 9.89*   BNP: Invalid input(s): POCBNP D-Dimer: No  results for input(s): DDIMER in the last 72 hours. Hemoglobin A1C: No results for input(s): HGBA1C in the last 72 hours. Fasting Lipid Panel:  Recent Labs  05/17/15 0222  CHOL 129  HDL 33*  LDLCALC 73  TRIG 606  CHOLHDL 3.9   Thyroid Function Tests:  Recent Labs  05/16/15 2115  TSH 2.779   Anemia Panel: No results for input(s): VITAMINB12, FOLATE, FERRITIN, TIBC, IRON, RETICCTPCT in the last 72 hours. Coag Panel:   Lab Results  Component Value Date   INR 1.25 05/16/2015   INR 1.03 05/16/2015   INR 1.01 02/22/2010    RADIOLOGY: Dg Chest 2 View  05/16/2015  CLINICAL DATA:  Chest pain. EXAM: CHEST  2 VIEW COMPARISON:  02/21/2010 and 08/10/2006 FINDINGS: Heart size and pulmonary vascularity are normal and the lungs are clear. No effusions. No acute osseous abnormality. Right proximal humeral prosthesis. IMPRESSION: No active cardiopulmonary disease. Electronically Signed   By: Francene Boyers M.D.   On: 05/16/2015  14:43      ASSESSMENT: Acute anterior wall MI  PLAN:  Continue dual antiplatelet therapy. Troponins trending down. He also had PCI of the circumflex. I personally reviewed the films. Continue aggressive secondary prevention. He is on high-dose statin, aspirin, Brilinta, beta blocker, as well as ACE inhibitor. Blood pressure is been a little high. May need to increase ACE inhibitor dose if blood pressure continues to be high.  Plan for discharge on Monday. Potentially could go home tomorrow if he is doing very well.  Mild renal insufficiency: Stable.  We'll transfer out of ICU.  Corky Crafts, MD  05/17/2015  11:19 AM

## 2015-05-17 NOTE — Progress Notes (Signed)
Report called to Ray RN on unit 3W. Patient to be transferred to 3W06 via wheelchair.

## 2015-05-17 NOTE — Progress Notes (Signed)
  Echocardiogram 2D Echocardiogram has been performed.  [name] 05/17/2015, 9:42 AM

## 2015-05-18 ENCOUNTER — Encounter (HOSPITAL_COMMUNITY): Payer: Self-pay | Admitting: Nurse Practitioner

## 2015-05-18 DIAGNOSIS — I2510 Atherosclerotic heart disease of native coronary artery without angina pectoris: Secondary | ICD-10-CM

## 2015-05-18 DIAGNOSIS — I255 Ischemic cardiomyopathy: Secondary | ICD-10-CM

## 2015-05-18 DIAGNOSIS — E119 Type 2 diabetes mellitus without complications: Secondary | ICD-10-CM

## 2015-05-18 DIAGNOSIS — I119 Hypertensive heart disease without heart failure: Secondary | ICD-10-CM

## 2015-05-18 LAB — CBC
HCT: 38.1 % — ABNORMAL LOW (ref 39.0–52.0)
Hemoglobin: 13 g/dL (ref 13.0–17.0)
MCH: 30.6 pg (ref 26.0–34.0)
MCHC: 34.1 g/dL (ref 30.0–36.0)
MCV: 89.6 fL (ref 78.0–100.0)
Platelets: 217 10*3/uL (ref 150–400)
RBC: 4.25 MIL/uL (ref 4.22–5.81)
RDW: 12.9 % (ref 11.5–15.5)
WBC: 6.6 10*3/uL (ref 4.0–10.5)

## 2015-05-18 LAB — BASIC METABOLIC PANEL
Anion gap: 12 (ref 5–15)
BUN: 11 mg/dL (ref 6–20)
CO2: 22 mmol/L (ref 22–32)
Calcium: 9 mg/dL (ref 8.9–10.3)
Chloride: 105 mmol/L (ref 101–111)
Creatinine, Ser: 1.22 mg/dL (ref 0.61–1.24)
GFR calc Af Amer: >60 mL/min (ref >60)
GFR calc non Af Amer: 57 mL/min — ABNORMAL LOW (ref >60)
Glucose, Bld: 142 mg/dL — ABNORMAL HIGH (ref 65–99)
Potassium: 3.9 mmol/L (ref 3.5–5.1)
Sodium: 139 mmol/L (ref 135–145)

## 2015-05-18 LAB — GLUCOSE, CAPILLARY
Glucose-Capillary: 155 mg/dL — ABNORMAL HIGH (ref 65–99)
Glucose-Capillary: 179 mg/dL — ABNORMAL HIGH (ref 65–99)

## 2015-05-18 MED ORDER — ATORVASTATIN CALCIUM 80 MG PO TABS: 80.0000 mg | ORAL_TABLET | Freq: Every day | ORAL | Status: DC

## 2015-05-18 MED ORDER — METOPROLOL SUCCINATE ER 50 MG PO TB24: 50.0000 mg | ORAL_TABLET | Freq: Every day | ORAL | Status: DC

## 2015-05-18 MED ORDER — TICAGRELOR 90 MG PO TABS: 90.0000 mg | ORAL_TABLET | Freq: Two times a day (BID) | ORAL | Status: DC

## 2015-05-18 NOTE — Progress Notes (Signed)
Subjective:  Doing quite well ambulatory in the hall without shortness of breath or chest pain and wants to go home.  Objective:  Vital Signs in the last 24 hours: BP 144/72 mmHg  Pulse 64  Temp(Src) 98.6 F (37 C) (Oral)  Resp 14  Ht 5\' 11"  (1.803 m)  Wt 79.243 kg (174 lb 11.2 oz)  BMI 24.38 kg/m2  SpO2 98%  Physical Exam: Pleasant black male in no acute distress Lungs:  Clear Cardiac:  Regular rhythm, normal S1 and S2, no S3 Extremities:  Radial cath site clean and dry   Intake/Output from previous day: 01/21 0701 - 01/22 0700 In: 310 [P.O.:60; I.V.:250] Out: 600 [Urine:600]  Weight Filed Weights   05/16/15 2000 05/17/15 0400 05/18/15 0500  Weight: 82.781 kg (182 lb 8 oz) 80.65 kg (177 lb 12.8 oz) 79.243 kg (174 lb 11.2 oz)    Lab Results: Basic Metabolic Panel:  Recent Labs  46/27/03 0745 05/18/15 0625  NA 142 139  K 3.8 3.9  CL 109 105  CO2 26 22  GLUCOSE 149* 142*  BUN 9 11  CREATININE 1.34* 1.22   CBC:  Recent Labs  05/17/15 0745 05/18/15 0625  WBC 6.8 6.6  HGB 11.7* 13.0  HCT 34.7* 38.1*  MCV 89.9 89.6  PLT 207 217   Cardiac Enzymes: Troponin (Point of Care Test)  Recent Labs  05/16/15 1422  TROPIPOC 2.70*   Cardiac Panel (last 3 results)  Recent Labs  05/16/15 2115 05/17/15 0222 05/17/15 0745  TROPONINI 13.32* 14.26* 9.89*    Telemetry: Sinus rhythm  Assessment/Plan:  1.  Recent drug-eluting stents of mid LAD for thrombotic occlusion and circumflex stenosis.  He is currently feeling much better to go home today.   CAD  Recommendations:  Okay for discharge today.      Darden Palmer  MD [facility]  05/18/2015, 10:49 AM

## 2015-05-18 NOTE — Discharge Instructions (Signed)
**  PLEASE REMEMBER TO BRING ALL OF YOUR MEDICATIONS TO EACH OF YOUR FOLLOW-UP OFFICE VISITS. ° °NO HEAVY LIFTING X 2 WEEKS. °NO SEXUAL ACTIVITY X 2 WEEKS. °NO DRIVING X 1 WEEK. °NO SOAKING BATHS, HOT TUBS, POOLS, ETC., X 7 DAYS. ° °Radial Site Care °Refer to this sheet in the next few weeks. These instructions provide you with information on caring for yourself after your procedure. Your caregiver may also give you more specific instructions. Your treatment has been planned according to current medical practices, but problems sometimes occur. Call your caregiver if you have any problems or questions after your procedure. °HOME CARE INSTRUCTIONS °· You may shower the day after the procedure. Remove the bandage (dressing) and gently wash the site with plain soap and water. Gently pat the site dry.  °· Do not apply powder or lotion to the site.  °· Do not submerge the affected site in water for 3 to 5 days.  °· Inspect the site at least twice daily.  °· Do not flex or bend the affected arm for 24 hours.  °· No lifting over 5 pounds (2.3 kg) for 5 days after your procedure.  °· Do not drive home if you are discharged the same day of the procedure. Have someone else drive you.  ° °What to expect: °· Any bruising will usually fade within 1 to 2 weeks.  °· Blood that collects in the tissue (hematoma) may be painful to the touch. It should usually decrease in size and tenderness within 1 to 2 weeks.  °SEEK IMMEDIATE MEDICAL CARE IF: °· You have unusual pain at the radial site.  °· You have redness, warmth, swelling, or pain at the radial site.  °· You have drainage (other than a small amount of blood on the dressing).  °· You have chills.  °· You have a fever or persistent symptoms for more than 72 hours.  °· You have a fever and your symptoms suddenly get worse.  °· Your arm becomes pale, cool, tingly, or numb.  °· You have heavy bleeding from the site. Hold pressure on the site.  ° °  ° °10 Habits of Highly Healthy  People ° °[place] wants to help you get well and stay well.  Live a longer, healthier life by practicing healthy habits every day. ° °1.  Visit your primary care provider regularly. °2.  Make time for family and friends.  Healthy relationships are important. °3.  Take medications as directed by your provider. °4.  Maintain a healthy weight and a trim waistline. °5.  Eat healthy meals and snacks, rich in fruits, vegetables, whole grains, and lean proteins. °6.  Get moving every day - aim for 150 minutes of moderate physical activity each week. °7.  Don't smoke. °8.  Avoid alcohol or drink in moderation. °9.  Manage stress through meditation or mindful relaxation. °10.  Get seven to nine hours of quality sleep each night. ° °Want more information on healthy habits?  To learn more about these and other healthy habits, visit [email or web address]. °_____________ °  °  °

## 2015-05-18 NOTE — Discharge Summary (Signed)
Discharge Summary    Patient ID: [name],  MRN: [id number], DOB/AGE: Jul 18, 1940 75 y.o.  Admit date: 05/16/2015 Discharge date: 05/18/2015  Primary Care Provider: Leanor Rubenstein Primary Cardiologist: J. Hochrein, MD   Discharge Diagnoses    Principal Problem:   NSTEMI (non-ST elevated myocardial infarction) (HCC)  ** s/p PCI/DES to the LAD and LCX this admission. Active Problems:   CAD (coronary artery disease)   Cardiomyopathy, ischemic  **EF 25-30% by echo this admission.   TOBACCO ABUSE   Hypertensive heart disease   Type II diabetes mellitus (HCC)   Hyperlipidemia LDL goal <70   Allergies No Known Allergies  Diagnostic Studies/Procedures    Cardiac Catheterization and Percutaneous Coronary Intervention 1.20.2017  Coronary Findings     Dominance: Right    Left Anterior Descending   . Prox LAD lesion, 40% stenosed. The lesion was previously treated with a drug-eluting stent greater than two years ago. There is 40% re-stenosis in the midportion of the previously implanted stent   . Dist LAD lesion, 100% stenosed.   Marland Kitchen PCI: The distal LAD was successfully treated using a 2.5 x 20 mm Promus DES.  Marland Kitchen There is a 10% residual stenosis post intervention.        Left Circumflex   . Mid Cx lesion, 95% stenosed.   Marland Kitchen PCI: The mid LCX was successfully treated using a 3.0 x 20 Promus DES.  Marland Kitchen There is no residual stenosis post intervention.        Right Coronary Artery   . Prox RCA lesion, 30% stenosed. Diffuse.   . Mid RCA lesion, 40% stenosed. Diffuse.   . Dist RCA lesion, 70% stenosed.    _____________  2D Echocardiogram 1.21.2017  Study Conclusions  - Left ventricle: The cavity size was normal. Systolic function was   severely reduced. The estimated ejection fraction was in the   range of 25% to 30%. Akinesis of the apical myocardium. Features   are consistent with a pseudonormal left ventricular filling   pattern, with concomitant abnormal relaxation and  increased   filling pressure (grade 2 diastolic dysfunction). - Aortic valve: There was trivial regurgitation. - Mitral valve: There was mild regurgitation. - Left atrium: The atrium was mildly dilated. - Pulmonary arteries: Systolic pressure was mildly increased. PA   peak pressure: 35 mm Hg (S). _____________   History of Present Illness     75 y/o male with a h/o CAD s/p prior LAD stenting in 2011.  He also has a h/o HTN, HL, DM II, and tobacco abuse.  He was recently seen in clinic in late December with complaints of recurrent chest pain.  A myoview was performed and revealed depressed LV function with predominantly fixed defects with peri-infarct ischemia involving the anteroapical and inferior walls.  Outpatient echo was ordered to reassess EF.  Unfortunately, he continued to have intermittent chest pain and on the day prior to admission, had a particularly bad episode while working in his yard. He had recurrent chest pain in the early morning hours of 1/20 and subsequently presented to the [facility] ED for evaluation.  There, ECG was notable for downsloping ST dpression in III, aVF, and V6 with 1mm ST elevation in lead I.  Troponin elevated to 3.35 in the ED.  He continued to have chest pain and decision was made to pursue urgent catheterization.  Hospital Course     Consultants: None   Patient underwent urgent diagnostic catheterization revealing a total thrombotic occlusion  of the mid/distal LAD as well as critical stenosis within the LCX.  The LAD was felt to be the culprit and was successfully treated using a Promus DES.  The LCX was also successfully treated using a Promus DES.  LVEDP was normal @ 10 mmHg.  Post PCI, pt peaked his troponin at 14.26.  He was maintained on ASA, brilinta, bb, ace inhibitor, and high potency statin therapy.  He had no further chest pain, dyspnea, or evidence of volume overload.  Echocardiogram on 1/21 revealed LV dysfunction, with an EF of 25-30%.  He has  remained euvolemic.  Mr. Sedler has been ambulating without recurrent symptoms or limitations and will be discharged home today in good condition.  We will arrange for early follow-up within 7 days. _____________  Discharge Vitals Blood pressure 144/72, pulse 64, temperature 98.6 F (37 C), temperature source Oral, resp. rate 14, height  (1.803 m), weight 174 lb 11.2 oz (79.243 kg), SpO2 98 %.  Filed Weights   05/16/15 2000 05/17/15 0400 05/18/15 0500  Weight: 182 lb 8 oz (82.781 kg) 177 lb 12.8 oz (80.65 kg) 174 lb 11.2 oz (79.243 kg)    Labs & Radiologic Studies     CBC  Recent Labs  05/17/15 0745 05/18/15 0625  WBC 6.8 6.6  HGB 11.7* 13.0  HCT 34.7* 38.1*  MCV 89.9 89.6  PLT 207 217   Basic Metabolic Panel  Recent Labs  05/16/15 1539 05/16/15 2115  05/17/15 0745 05/18/15 0625  NA  --   --   < > 142 139  K  --   --   < > 3.8 3.9  CL  --   --   < > 109 105  CO2  --   --   < > 26 22  GLUCOSE  --   --   < > 149* 142*  BUN  --   --   < > 9 11  CREATININE  --   --   < > 1.34* 1.22  CALCIUM  --   --   < > 8.5* 9.0  MG 1.5* 1.4*  --   --   --   < > = values in this interval not displayed.  Cardiac Enzymes  Recent Labs  05/16/15 2115 05/17/15 0222 05/17/15 0745  TROPONINI 13.32* 14.26* 9.89*   Fasting Lipid Panel  Recent Labs  05/17/15 0222  CHOL 129  HDL 33*  LDLCALC 73  TRIG 161  CHOLHDL 3.9   Thyroid Function Tests  Recent Labs  05/16/15 2115  TSH 2.779   Dg Chest 2 View  05/16/2015  CLINICAL DATA:  Chest pain. EXAM: CHEST  2 VIEW COMPARISON:  02/21/2010 and 08/10/2006 FINDINGS: Heart size and pulmonary vascularity are normal and the lungs are clear. No effusions. No acute osseous abnormality. Right proximal humeral prosthesis. IMPRESSION: No active cardiopulmonary disease. Electronically Signed   By: Francene Boyers M.D.   On: 05/16/2015 14:43   Disposition   Pt is being discharged home today in good condition.  Follow-up Plans &  Appointments    Follow-up Information    Follow up with Rollene Rotunda, MD In 1 week.   Specialty:  Cardiology   Why:  we will arrange for follow-up and contact you.   Contact information:   [address] [place] [address] [phone]      Discharge Instructions    Amb Referral to Cardiac Rehabilitation    Complete by:  As directed  Diagnosis:   Myocardial Infarction PCI             Discharge Medications   Current Discharge Medication List    START taking these medications   Details  metoprolol succinate (TOPROL XL) 50 MG 24 hr tablet Take 1 tablet (50 mg total) by mouth daily. Take with or immediately following a meal. Qty: 30 tablet, Refills: 6    ticagrelor (BRILINTA) 90 MG TABS tablet Take 1 tablet (90 mg total) by mouth 2 (two) times daily. Qty: 60 tablet, Refills: 6      CONTINUE these medications which have CHANGED   Details  atorvastatin (LIPITOR) 80 MG tablet Take 1 tablet (80 mg total) by mouth daily. Qty: 30 tablet, Refills: 6      CONTINUE these medications which have NOT CHANGED   Details  aspirin 81 MG tablet Take 81 mg by mouth daily as needed for pain.     metFORMIN (GLUMETZA) 1000 MG (MOD) 24 hr tablet Take 1,000 mg by mouth 2 (two) times daily with a meal.    nitroGLYCERIN (NITROSTAT) 0.4 MG SL tablet PLACE 1 TABLET UNDER THE TONGUE AS DIRECTED. Qty: 25 tablet, Refills: 2    ramipril (ALTACE) 10 MG capsule Take 10 mg by mouth daily.      STOP taking these medications     Ibuprofen-Diphenhydramine Cit (IBUPROFEN PM) 200-38 MG TABS         Aspirin prescribed at discharge?  Yes High Intensity Statin Prescribed? (Lipitor 40-80mg  or Crestor 20-40mg ): Yes Beta Blocker Prescribed? Yes For EF 45% or less, Was ACEI/ARB Prescribed? Yes ADP Receptor Inhibitor Prescribed? (i.e. Plavix etc.-Includes Medically Managed Patients): Yes For EF <40%, Aldosterone Inhibitor Prescribed? No: deferred to outpt setting.   Was EF assessed  during THIS hospitalization? Yes Was Cardiac Rehab II ordered? (Included Medically managed Patients): Yes   Outstanding Labs/Studies   BMET @ f/u (new ACEI). Lipids/lft's in 6 wks (higher dose statin). F/U echo in 3 mos (new ICM).  Duration of Discharge Encounter   Greater than 30 minutes including physician time.  Signed, Nicolasa Ducking NP 05/18/2015, 11:29 AM

## 2015-05-18 NOTE — Care Management Note (Signed)
Case Management Note  Patient Details  Name: [name] MRN: [id number] Date of Birth: 1940-12-29  Subjective/Objective:                  Pt was cleaning in yard.yeserday Had pain Pressure LIke elephant Action/Plan: Discharge planning Expected Discharge Date:  05/17/14               Expected Discharge Plan:  Home/Self Care  In-House Referral:     Discharge planning Services  CM Consult, Medication Assistance  Post Acute Care Choice:    Choice offered to:     DME Arranged:    DME Agency:     HH Arranged:    [place] Agency:     Status of Service:  Completed, signed off  Medicare Important Message Given:    Date Medicare IM Given:    Medicare IM give by:    Date Additional Medicare IM Given:    Additional Medicare Important Message give by:     If discussed at [place] of Stay Meetings, dates discussed:    Additional Comments: CM met with pt in room who already has Brilinta free 30 day trial card and discount card for refills.  Pt verbalized the 30 day free trial card will cover today's discharge prescription and give time to have the medication authorized by insurance for refills.  No other CM needs were communicated. Dellie Catholic, RN 05/18/2015, 10:01 AM

## 2015-05-19 ENCOUNTER — Encounter (HOSPITAL_COMMUNITY): Payer: Self-pay | Admitting: Cardiovascular Disease

## 2015-05-19 ENCOUNTER — Other Ambulatory Visit: Payer: Self-pay | Admitting: Cardiology

## 2015-05-19 ENCOUNTER — Telehealth: Payer: Self-pay | Admitting: Cardiology

## 2015-05-19 LAB — HEMOGLOBIN A1C
Hgb A1c MFr Bld: 8.5 % — ABNORMAL HIGH (ref 4.8–5.6)
Mean Plasma Glucose: 197 mg/dL

## 2015-05-19 NOTE — Telephone Encounter (Signed)
Rx request sent to pharmacy.  

## 2015-05-19 NOTE — Telephone Encounter (Signed)
Closed encounter °

## 2015-05-19 NOTE — Telephone Encounter (Signed)
Follow Up  Pt returned call. States that he received a call from the nurse. Please call back to discuss

## 2015-05-19 NOTE — Telephone Encounter (Signed)
Pt had an old voice mail from our office he was calling back. No questions at this time.

## 2015-05-20 ENCOUNTER — Telehealth: Payer: Self-pay | Admitting: Cardiology

## 2015-05-20 NOTE — Telephone Encounter (Signed)
New problem    Pt has 7day TOC w/Dr Hochrein 1.30.17 per staff message.Marland KitchenMarland Kitchen

## 2015-05-21 NOTE — Telephone Encounter (Signed)
Patient contacted regarding discharge from [facility] on 05/18/15.  Patient understands to follow up with provider Dr. Antoine Poche on 05/26/15 at 11:45 at [facility]. Patient understands discharge instructions? Yes Patient understands medications and regiment? Yes Patient understands to bring all medications to this visit? Yes

## 2015-05-22 ENCOUNTER — Other Ambulatory Visit (HOSPITAL_COMMUNITY): Payer: Medicare PPO

## 2015-05-25 NOTE — Progress Notes (Signed)
Pt left appt because I was running behind.  We are trying to reschedule with him.

## 2015-05-26 ENCOUNTER — Encounter: Payer: Self-pay | Admitting: Cardiology

## 2015-05-26 ENCOUNTER — Encounter: Payer: Medicare Other | Admitting: Cardiology

## 2015-05-27 ENCOUNTER — Telehealth: Payer: Self-pay | Admitting: *Deleted

## 2015-05-27 ENCOUNTER — Telehealth: Payer: Self-pay

## 2015-05-27 ENCOUNTER — Encounter: Payer: Self-pay | Admitting: *Deleted

## 2015-05-27 NOTE — Telephone Encounter (Signed)
Patient here 05/26/15 to see Dr. Antoine Poche.  Patient left without being seen--he was tired of waiting.  Dr. Antoine Poche wanted the schedulers to call the patient, apologize and ask him to come in tomorrow (05/27/15) at 1:30 pm.  I called his home number [phone] taking calls at this time --I called this number 4 times.  I also called his cell phone [phone] x [phone] and left messages for patient to call.  I have called 3 time this morning with the same result.

## 2015-05-27 NOTE — Telephone Encounter (Signed)
Patient called no answer.Left message on personal voice mail received a message from Dr.Hochrein you need post hospital follow up.Sorry you could not wait at your appointment with Dr.Hochrein yesterday 05/26/15.Dr.Hochrein can see you today at 1:15 pm with no waiting.Advised to call me back if you can come.

## 2015-05-30 ENCOUNTER — Telehealth: Payer: Self-pay | Admitting: *Deleted

## 2015-05-30 NOTE — Telephone Encounter (Signed)
Tried at 9:39 am to reach patient to reschedule his appointment with Dr. Lincoln Maxin # [phone] not accepting calls and cell [phone] goes to voice mail--I left another message

## 2015-06-03 ENCOUNTER — Telehealth: Payer: Self-pay | Admitting: Cardiology

## 2015-06-03 NOTE — Telephone Encounter (Signed)
Pt had heart attack recently, starting yesterday having a pinching feeling near arm pit on the left side -pt took one nitro today and one yesterday, seems to have helped--pls advise

## 2015-06-03 NOTE — Telephone Encounter (Signed)
Patient DC from hospital Has not had follow up yet; no show 1/30.  Rescheduled today  for 2/14 Paitient has had a pinching feeling near left arm pit Took Nitro with relief Instructed patient if he has pain that is not relieved by Nitro or pain comes and he has shortness of breath, sweating or shortness of breath he needs to go to the ED or call us immediately

## 2015-06-10 ENCOUNTER — Ambulatory Visit (INDEPENDENT_AMBULATORY_CARE_PROVIDER_SITE_OTHER): Payer: Medicare Other | Admitting: Physician Assistant

## 2015-06-10 ENCOUNTER — Encounter: Payer: Self-pay | Admitting: Physician Assistant

## 2015-06-10 VITALS — BP 129/70 | HR 48 | Ht 71.0 in | Wt 181.0 lb

## 2015-06-10 DIAGNOSIS — I255 Ischemic cardiomyopathy: Secondary | ICD-10-CM

## 2015-06-10 DIAGNOSIS — I214 Non-ST elevation (NSTEMI) myocardial infarction: Secondary | ICD-10-CM | POA: Diagnosis not present

## 2015-06-10 DIAGNOSIS — R001 Bradycardia, unspecified: Secondary | ICD-10-CM | POA: Diagnosis not present

## 2015-06-10 MED ORDER — METOPROLOL SUCCINATE ER 50 MG PO TB24: 50.0000 mg | ORAL_TABLET | Freq: Every day | ORAL | Status: DC

## 2015-06-10 MED ORDER — RAMIPRIL 10 MG PO CAPS: 10.0000 mg | ORAL_CAPSULE | Freq: Every day | ORAL | Status: DC

## 2015-06-10 NOTE — Progress Notes (Signed)
Cardiology Office Note   Date:  06/10/2015   ID:  SHRIHAAN PORZIO, DOB 12-20-1940, MRN [id number]  PCP:  Leanor Rubenstein, MD  Cardiologist:  Dr Luna Kitchens, PA-C   Chief Complaint  Patient presents with  . Follow-up    some chest pain, no swelling, some shortness of breath, some cramping, no dizziness or lightheadednes    History of Present Illness: [name] is a 75 y.o. male with a history of CAD, DM, HLD, ICM w/ EF 25-30%, tob use.  Dc 01/22 after admit for NSTEMI w/ DES LAD & CFX, EF 25-30% by echo  [name] presents for  Post hospital follow-up.    Since discharge from the hospital, he has done pretty well. He gets occasional pinching in his chest when he gets up from lying down. He has started increasing his activity and has had no exertional chest pain.  He has brief mild shortness of breath when he lies down , but this resolves when he takes a couple of deep breaths. He has been tracking his weight and it has decreased some. He has had no lower extremity edema, and denies PND.   He is doing yoga and some meditation.  He feels this is helping him. His blood pressure is under good control. He is concerned if he will no if something that is happening to his stent.   he is aware that his heart rate is in the 40s at times, but feels he is asymptomatic from this. He denies any presyncope or dizziness.   Past Medical History  Diagnosis Date  . Coronary artery disease     a. 2007 dLAD 100->med rx;  b. 2011 PCI: 80 LAD (Promus 2.75x18); c. 03/2015 MV: predom fixed inf/antapical defefcts w/ ischemia, depressed EF;  c. 04/2015 NSTEMI/PCI: LM nl,LAD 40p ISR, 100d (2.5x20 Promus DES), LCX 55m (3.0x20 Promus DES), RCA 30p, 86m, 70d.   Marland Kitchen Hx MRSA infection   . Diabetes mellitus without complication (HCC)   . Dyslipidemia   . Tobacco abuse   . Ischemic cardiomyopathy     a. 04/2015 Echo: EF 25-30%, apical AK, Gr 2 DD, triv AI, mild MR, mildly dil LA, PASP .     Past Surgical History  Procedure Laterality Date  . Total shoulder replacement      Right  . Cardiac catheterization N/A 05/16/2015    Procedure: Left Heart Cath and Coronary Angiography;  Surgeon: Tonny Bollman, MD;  Location: [facility] INVASIVE CV LAB;  Service: Cardiovascular;  Laterality: N/A;  . Cardiac catheterization N/A 05/16/2015    Procedure: Coronary Stent Intervention;  Surgeon: Tonny Bollman, MD;  Location: [facility] INVASIVE CV LAB;  Service: Cardiovascular;  Laterality: N/A;    Current Outpatient Prescriptions  Medication Sig Dispense Refill  . aspirin 81 MG tablet Take 81 mg by mouth daily as needed for pain.     Marland Kitchen atorvastatin (LIPITOR) 80 MG tablet Take 1 tablet (80 mg total) by mouth daily. 30 tablet 6  . metFORMIN (GLUMETZA) 1000 MG (MOD) 24 hr tablet Take 1,000 mg by mouth 2 (two) times daily with a meal.    . metoprolol succinate (TOPROL XL) 50 MG 24 hr tablet Take 1 tablet (50 mg total) by mouth daily. Take with or immediately following a meal. 30 tablet 6  . nitroGLYCERIN (NITROSTAT) 0.4 MG SL tablet PLACE 1 TABLET UNDER THE TONGUE AS DIRECTED. 25 tablet 2  . ramipril (ALTACE) 10 MG capsule Take 10 mg  by mouth daily.    . ticagrelor (BRILINTA) 90 MG TABS tablet Take 1 tablet (90 mg total) by mouth 2 (two) times daily. 60 tablet 6   No current facility-administered medications for this visit.    Allergies:   Review of patient's allergies indicates no known allergies.    Social History:  The patient  reports that he has quit smoking. He does not have any smokeless tobacco history on file.   Family History:  The patient's family history is not on file.    ROS:  Please see the history of present illness. All other systems are reviewed and negative.    PHYSICAL EXAM: VS:  BP 129/70 mmHg  Pulse 48  Ht 5\' 11"  (1.803 m)  Wt 181 lb (82.101 kg)  BMI 25.26 kg/m2 , BMI Body mass index is 25.26 kg/(m^2). GEN: Well nourished, well developed, male in no acute distress HEENT:  normal for age  Neck: no JVD, no carotid bruit, no masses Cardiac: RRR; no murmur, no rubs, or gallops Respiratory:  clear to auscultation bilaterally, normal work of breathing GI: soft, nontender, nondistended, + BS MS: no deformity or atrophy; no edema; distal pulses are 2+ in all 4 extremities  Skin: warm and dry, no rash Neuro:  Strength and sensation are intact Psych: euthymic mood, full affect   EKG:  EKG is ordered today. The ekg ordered today demonstrates  Sinus bradycardia, rate 45 with evolving MI changes   Recent Labs: 05/16/2015: B Natriuretic Peptide 138.4*; Magnesium 1.4*; TSH 2.779 05/18/2015: BUN 11; Creatinine, Ser 1.22; Hemoglobin 13.0; Platelets 217; Potassium 3.9; Sodium 139    Lipid Panel    Component Value Date/Time   CHOL 129 05/17/2015 0222   TRIG 113 05/17/2015 0222   HDL 33* 05/17/2015 0222   CHOLHDL 3.9 05/17/2015 0222   VLDL 23 05/17/2015 0222   LDLCALC 73 05/17/2015 0222   LDLDIRECT 74.0 02/05/2010 0745     Wt Readings from Last 3 Encounters:  06/10/15 181 lb (82.101 kg)  05/18/15 174 lb 11.2 oz (79.243 kg)  04/22/15 186 lb (84.369 kg)     Other studies Reviewed: Additional studies/ records that were reviewed today include:  Hospital records an testing.  ASSESSMENT AND PLAN:  1.   History of non-STEMI: he is working on eating a heart healthy diabetic diet.  He is not having any ischemic symptoms with exertion. It is okay for him to continue to increase his activity level slowly. Continue current medications including aspirin, Brilinta, Lipitor and the beta blocker. He is also on an ACE inhibitor.   2. Ischemic cardiomyopathy: He is weighing himself daily. He is working on eating a low sodium diet. He has not had symptoms of volume overload. He has no volume overload on exam. I am not sure why he has to take a couple of deep breaths and he lays down, but I do not think his volume is elevated. He is to continue current therapies and daily weights  and let us know if he gains any weight or develops edema.   I advised him that we would recheck his echocardiogram in 3 months and a defibrillator can be considered at that time if his EF does not improve.   3. Dyslipidemia: he is tolerating high-dose statin at this time. Recheck lipid and liver in 3 months.   4. Bradycardia: he is asymptomatic with a heart rate in the 40s. He was offered a decrease in his beta blocker to allow his heart rate  running a little higher, but he does not feel this is needed. He is advised to contact us for symptoms.   Current medicines are reviewed at length with the patient today.  The patient does not have concerns regarding medicines.  The following changes have been made:  no change  Labs/ tests ordered today include:   Orders Placed This Encounter  Procedures  . EKG 12-Lead     Disposition:   FU with  Dr. Antoine Poche  Signed, Leanna Battles  06/10/2015 10:33 AM    [facility] [address] [place], [place], [place]  [address] Phone: [phone]; Fax: [phone]

## 2015-06-10 NOTE — Patient Instructions (Signed)
Rhonda Barrett, PA-C, has made no changes in your medications or treatment plan. Please continue your medications as listed.  Bjorn Loser recommends that you schedule a follow-up appointment in 3 months with Dr Antoine Poche.  **Please schedule your echocardiogram in 3 months, just prior to seeing Dr Antoine Poche.

## 2015-09-08 ENCOUNTER — Other Ambulatory Visit: Payer: Self-pay

## 2015-09-08 ENCOUNTER — Ambulatory Visit (HOSPITAL_COMMUNITY): Payer: Medicare Other | Attending: Cardiovascular Disease

## 2015-09-08 DIAGNOSIS — I10 Essential (primary) hypertension: Secondary | ICD-10-CM | POA: Diagnosis not present

## 2015-09-08 DIAGNOSIS — I071 Rheumatic tricuspid insufficiency: Secondary | ICD-10-CM | POA: Diagnosis not present

## 2015-09-08 DIAGNOSIS — I2510 Atherosclerotic heart disease of native coronary artery without angina pectoris: Secondary | ICD-10-CM | POA: Diagnosis not present

## 2015-09-08 DIAGNOSIS — I340 Nonrheumatic mitral (valve) insufficiency: Secondary | ICD-10-CM | POA: Diagnosis not present

## 2015-09-08 DIAGNOSIS — R079 Chest pain, unspecified: Secondary | ICD-10-CM | POA: Insufficient documentation

## 2015-09-08 DIAGNOSIS — I255 Ischemic cardiomyopathy: Secondary | ICD-10-CM | POA: Diagnosis not present

## 2015-09-08 DIAGNOSIS — R29898 Other symptoms and signs involving the musculoskeletal system: Secondary | ICD-10-CM | POA: Insufficient documentation

## 2015-09-08 DIAGNOSIS — E785 Hyperlipidemia, unspecified: Secondary | ICD-10-CM | POA: Insufficient documentation

## 2015-09-08 DIAGNOSIS — E119 Type 2 diabetes mellitus without complications: Secondary | ICD-10-CM | POA: Diagnosis not present

## 2015-09-08 DIAGNOSIS — R0602 Shortness of breath: Secondary | ICD-10-CM | POA: Insufficient documentation

## 2015-09-08 DIAGNOSIS — R0600 Dyspnea, unspecified: Secondary | ICD-10-CM | POA: Diagnosis present

## 2015-09-14 NOTE — Progress Notes (Signed)
EF is improved.  Call with results.

## 2015-09-15 ENCOUNTER — Ambulatory Visit: Payer: Medicare Other | Admitting: Cardiology

## 2015-10-06 ENCOUNTER — Encounter: Payer: Self-pay | Admitting: Cardiology

## 2015-10-13 ENCOUNTER — Ambulatory Visit: Payer: Medicare Other | Admitting: Cardiology

## 2015-12-24 ENCOUNTER — Other Ambulatory Visit: Payer: Self-pay | Admitting: Nurse Practitioner

## 2016-02-20 ENCOUNTER — Other Ambulatory Visit: Payer: Self-pay | Admitting: *Deleted

## 2016-02-20 MED ORDER — ATORVASTATIN CALCIUM 80 MG PO TABS: 80.0000 mg | ORAL_TABLET | Freq: Every day | ORAL | 0 refills | Status: DC

## 2016-06-03 ENCOUNTER — Other Ambulatory Visit: Payer: Self-pay | Admitting: Nurse Practitioner

## 2016-07-10 ENCOUNTER — Other Ambulatory Visit: Payer: Self-pay | Admitting: Cardiology

## 2016-07-12 NOTE — Telephone Encounter (Signed)
Rx(s) sent to pharmacy electronically.  

## 2016-08-16 ENCOUNTER — Other Ambulatory Visit: Payer: Self-pay | Admitting: Nurse Practitioner

## 2016-08-17 NOTE — Telephone Encounter (Signed)
Rx request sent to pharmacy.  

## 2016-08-20 ENCOUNTER — Other Ambulatory Visit: Payer: Self-pay | Admitting: Nurse Practitioner

## 2016-08-23 NOTE — Telephone Encounter (Signed)
REFILL 

## 2016-11-17 ENCOUNTER — Other Ambulatory Visit: Payer: Self-pay | Admitting: Nurse Practitioner

## 2017-02-14 ENCOUNTER — Other Ambulatory Visit: Payer: Self-pay | Admitting: Cardiology

## 2017-05-09 ENCOUNTER — Other Ambulatory Visit: Payer: Self-pay | Admitting: Cardiology

## 2017-05-11 ENCOUNTER — Other Ambulatory Visit: Payer: Self-pay | Admitting: Cardiology

## 2017-05-18 ENCOUNTER — Other Ambulatory Visit: Payer: Self-pay

## 2017-05-19 ENCOUNTER — Other Ambulatory Visit: Payer: Self-pay

## 2017-05-25 ENCOUNTER — Other Ambulatory Visit: Payer: Self-pay

## 2017-05-25 MED ORDER — NITROGLYCERIN 0.4 MG SL SUBL: SUBLINGUAL_TABLET | SUBLINGUAL | 0 refills | Status: DC

## 2017-08-11 IMAGING — NM NM MISC PROCEDURE
6 series · 36 of 36 positions shown · non-contrast
Comparison: none

[Series 1: wbr rest · 6.40mm/px · 6 of 64 frames shown]
[frame 6/64]
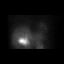
[frame 16/64]
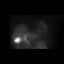
[frame 27/64]
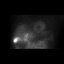
[frame 38/64]
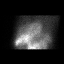
[frame 48/64]
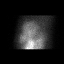
[frame 59/64]
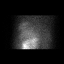

[Series 1: wbr_r-proj_st wbr rest · 6.40mm/px · 6 of 64 frames shown]
[frame 6/64]
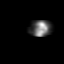
[frame 16/64]
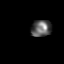
[frame 27/64]
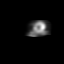
[frame 38/64]
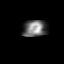
[frame 48/64]
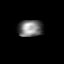
[frame 59/64]
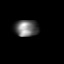

[Series 2: wbr stress-gsp · 6.40mm/px · 6 of 512 frames shown]
[frame 43/512]
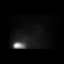
[frame 128/512]
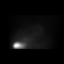
[frame 214/512]
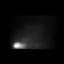
[frame 299/512]
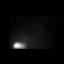
[frame 384/512]
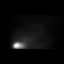
[frame 470/512]
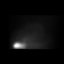

[Series 2: wbr_s-proj_st wbr stress-gsp · 6.40mm/px · 6 of 512 frames shown]
[frame 43/512]
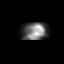
[frame 128/512]
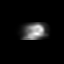
[frame 214/512]
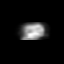
[frame 299/512]
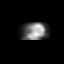
[frame 384/512]
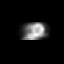
[frame 470/512]
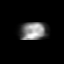

[Series 3: wbr_s-proj_st wbr stress-sum-em · 6.40mm/px · 6 of 64 frames shown]
[frame 6/64]
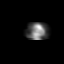
[frame 16/64]
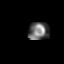
[frame 27/64]
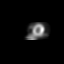
[frame 38/64]
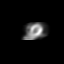
[frame 48/64]
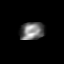
[frame 59/64]
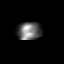

[Series 3: wbr stress-sum-em · 6.40mm/px · 6 of 64 frames shown]
[frame 6/64]
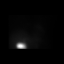
[frame 16/64]
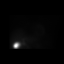
[frame 27/64]
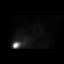
[frame 38/64]
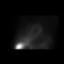
[frame 48/64]
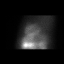
[frame 59/64]
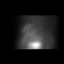

[36 of 36 positions shown; findings below may reference images not displayed]

Canned report from images found in remote index.

Refer to host system for actual result text.

## 2017-08-25 ENCOUNTER — Other Ambulatory Visit: Payer: Self-pay | Admitting: Cardiology

## 2017-09-14 ENCOUNTER — Other Ambulatory Visit: Payer: Self-pay | Admitting: Cardiology

## 2017-09-14 ENCOUNTER — Telehealth: Payer: Self-pay | Admitting: Cardiology

## 2017-09-14 NOTE — Telephone Encounter (Signed)
Called patient and LVM to call back to schedule followup with Dr. Hochrein. °

## 2017-09-23 ENCOUNTER — Telehealth: Payer: Self-pay | Admitting: Cardiology

## 2017-09-23 NOTE — Telephone Encounter (Signed)
Called patient and LVM to call back to scheduled followup with Dr. Antoine Poche.

## 2017-09-26 ENCOUNTER — Telehealth: Payer: Self-pay | Admitting: Cardiology

## 2017-09-26 NOTE — Telephone Encounter (Signed)
Called patient and LVM to call back to schedule followup with Dr. Hochrein. °

## 2018-04-13 ENCOUNTER — Inpatient Hospital Stay: Admission: AD | Admit: 2018-04-13 | Payer: Self-pay | Admitting: Cardiovascular Disease

## 2018-05-19 ENCOUNTER — Encounter: Payer: Self-pay | Admitting: Cardiology

## 2018-06-08 NOTE — Progress Notes (Deleted)
Cardiology Office Note   Date:  06/08/2018   ID:  [name], DOB 1940-08-13, MRN [id number]  PCP:  Ivan Nikolay, MD  Cardiologist:   No primary care provider on file. Referring:  ***  No chief complaint on file.     History of Present Illness: [name] is a 78 y.o. male who is referred by *** for evaluation of ***  I have not seen him since 2016.   He was last seen in our cardiac cath in 2017.   The last cardiac cath was in 2017.    The EF was mildly reduced at 45%.  ***   Past Medical History:  Diagnosis Date  . Coronary artery disease    a. 2007 dLAD 100->med rx;  b. 2011 PCI: 80 LAD (Promus 2.75x18); c. 03/2015 MV: predom fixed inf/antapical defefcts w/ ischemia, depressed EF;  c. 04/2015 NSTEMI/PCI: LM nl,LAD 40p ISR, 100d (2.5x20 Promus DES), LCX 63m (3.0x20 Promus DES), RCA 30p, 68m, 70d.   . Diabetes mellitus without complication (HCC)   . Dyslipidemia   . Hx MRSA infection   . Ischemic cardiomyopathy    a. 04/2015 Echo: EF 25-30%, apical AK, Gr 2 DD, triv AI, mild MR, mildly dil LA, PASP .  . Tobacco abuse     Past Surgical History:  Procedure Laterality Date  . CARDIAC CATHETERIZATION N/A 05/16/2015   Procedure: Left Heart Cath and Coronary Angiography;  Surgeon: Tonny Bollman, MD;  Location: [facility] INVASIVE CV LAB;  Service: Cardiovascular;  Laterality: N/A;  . CARDIAC CATHETERIZATION N/A 05/16/2015   Procedure: Coronary Stent Intervention;  Surgeon: Tonny Bollman, MD;  Location: [facility] INVASIVE CV LAB;  Service: Cardiovascular;  Laterality: N/A;  . TOTAL SHOULDER REPLACEMENT     Right     Current Outpatient Medications  Medication Sig Dispense Refill  . aspirin 81 MG tablet Take 81 mg by mouth daily as needed for pain.     Marland Kitchen atorvastatin (LIPITOR) 80 MG tablet Take 1 tablet (80 mg total) by mouth daily at 6 PM. PLEASE CONTACT OFFICE FOR ADDITIONAL REFILLS FINAL WARNING 30 tablet 0  . BRILINTA 90 MG TABS tablet TAKE 1 TABLET (90 MG TOTAL) BY MOUTH 2  (TWO) TIMES DAILY. 60 tablet 0  . BRILINTA 90 MG TABS tablet TAKE 1 TABLET (90 MG TOTAL) BY MOUTH 2 (TWO) TIMES DAILY. **NEEDS OV** 60 tablet 0  . metFORMIN (GLUMETZA) 1000 MG (MOD) 24 hr tablet Take 1,000 mg by mouth 2 (two) times daily with a meal.    . metoprolol succinate (TOPROL XL) 50 MG 24 hr tablet Take 1 tablet (50 mg total) by mouth daily. Take with or immediately following a meal. 30 tablet 11  . nitroGLYCERIN (NITROSTAT) 0.4 MG SL tablet PLACE 1 TABLET UNDER THE TONGUE AS DIRECTED. 75 tablet 0  . ramipril (ALTACE) 10 MG capsule Take 1 capsule (10 mg total) by mouth daily. 30 capsule 11   No current facility-administered medications for this visit.     Allergies:   Patient has no known allergies.    Social History:  The patient  reports that he has quit smoking. He has never used smokeless tobacco. He reports that he does not drink alcohol or use drugs.   Family History:  The patient's ***family history is not on file.    ROS:  Please see the history of present illness.   Otherwise, review of systems are positive for {NONE DEFAULTED:18576::"none"}.   All other systems are reviewed  and negative.    PHYSICAL EXAM: VS:  There were no vitals taken for this visit. , BMI There is no height or weight on file to calculate BMI. GENERAL:  Well appearing HEENT:  Pupils equal round and reactive, fundi not visualized, oral mucosa unremarkable NECK:  No jugular venous distention, waveform within normal limits, carotid upstroke brisk and symmetric, no bruits, no thyromegaly LYMPHATICS:  No cervical, inguinal adenopathy LUNGS:  Clear to auscultation bilaterally BACK:  No CVA tenderness CHEST:  Unremarkable HEART:  PMI not displaced or sustained,S1 and S2 within normal limits, no S3, no S4, no clicks, no rubs, *** murmurs ABD:  Flat, positive bowel sounds normal in frequency in pitch, no bruits, no rebound, no guarding, no midline pulsatile mass, no hepatomegaly, no splenomegaly EXT:  2  plus pulses throughout, no edema, no cyanosis no clubbing SKIN:  No rashes no nodules NEURO:  Cranial nerves II through XII grossly intact, motor grossly intact throughout PSYCH:  Cognitively intact, oriented to person place and time  CARDIAC CATH   Prox RCA lesion, 30% stenosed.  Mid RCA lesion, 40% stenosed.  Dist RCA lesion, 70% stenosed.  Mid Cx lesion, 95% stenosed. Post intervention, there is a 0% residual stenosis.  Prox LAD lesion, 40% stenosed. The lesion was previously treated with a drug-eluting stent greater than two years ago.  Dist LAD lesion, 100% stenosed. Post intervention, there is a 10% residual stenosis.   1. Total thrombotic occlusion of the mid LAD, treated successfully with PCI using a drug-eluting stent 2. Critical stenosis of the mid left circumflex, treated successfully with drug-eluting stent 3. Moderately tight stenosis of the distal RCA, likely not flow limiting  Recommendations: The patient will need aggressive medical therapy. Recommend dual antiplatelet therapy with aspirin and brilinta for at least 12 months. Secondary risk reduction measures to include a high intensity statin drug, ACE inhibitor, beta blocker. Will assess LV function by echocardiography.  EKG:  EKG {ACTION; IS/IS PJK:93267124} ordered today. The ekg ordered today demonstrates ***   Recent Labs: No results found for requested labs within last 8760 hours.    Lipid Panel    Component Value Date/Time   CHOL 129 05/17/2015 0222   TRIG 113 05/17/2015 0222   HDL 33 (L) 05/17/2015 0222   CHOLHDL 3.9 05/17/2015 0222   VLDL 23 05/17/2015 0222   LDLCALC 73 05/17/2015 0222   LDLDIRECT 74.0 02/05/2010 0745      Wt Readings from Last 3 Encounters:  06/10/15 181 lb (82.1 kg)  05/18/15 174 lb 11.2 oz (79.2 kg)  04/22/15 186 lb (84.4 kg)      Other studies Reviewed: Additional studies/ records that were reviewed today include: ***. Review of the above records demonstrates:   Please see elsewhere in the note.  ***   ASSESSMENT AND PLAN:  CAD:  ***  ISCHEMIC CARDIOMYOPATHY:  ***  HTN:  ***  DYSLIPIDEMIA:  ***     Current medicines are reviewed at length with the patient today.  The patient {ACTIONS; HAS/DOES NOT HAVE:19233} concerns regarding medicines.  The following changes have been made:  {PLAN; NO CHANGE:13088:s}  Labs/ tests ordered today include: *** No orders of the defined types were placed in this encounter.    Disposition:   FU with ***    Signed, Rollene Rotunda, MD  06/08/2018 8:51 PM    [facility]

## 2018-06-09 ENCOUNTER — Ambulatory Visit: Payer: Medicare Other | Admitting: Cardiology

## 2018-06-12 ENCOUNTER — Encounter: Payer: Self-pay | Admitting: *Deleted

## 2020-02-28 ENCOUNTER — Inpatient Hospital Stay (HOSPITAL_COMMUNITY): Payer: Medicare PPO

## 2020-02-28 ENCOUNTER — Inpatient Hospital Stay (HOSPITAL_COMMUNITY)
Admission: EM | Admit: 2020-02-28 | Discharge: 2020-03-05 | DRG: 064 | Disposition: A | Discharge status: Rehabilitation Facility | Payer: Medicare PPO | Attending: Internal Medicine | Admitting: Internal Medicine

## 2020-02-28 ENCOUNTER — Emergency Department (HOSPITAL_COMMUNITY): Payer: Medicare PPO

## 2020-02-28 ENCOUNTER — Other Ambulatory Visit: Payer: Self-pay

## 2020-02-28 DIAGNOSIS — M6282 Rhabdomyolysis: Secondary | ICD-10-CM | POA: Diagnosis present

## 2020-02-28 DIAGNOSIS — I6389 Other cerebral infarction: Secondary | ICD-10-CM | POA: Diagnosis not present

## 2020-02-28 DIAGNOSIS — J690 Pneumonitis due to inhalation of food and vomit: Secondary | ICD-10-CM | POA: Diagnosis present

## 2020-02-28 DIAGNOSIS — N179 Acute kidney failure, unspecified: Secondary | ICD-10-CM

## 2020-02-28 DIAGNOSIS — R29713 NIHSS score 13: Secondary | ICD-10-CM | POA: Diagnosis present

## 2020-02-28 DIAGNOSIS — B9561 Methicillin susceptible Staphylococcus aureus infection as the cause of diseases classified elsewhere: Secondary | ICD-10-CM | POA: Diagnosis not present

## 2020-02-28 DIAGNOSIS — E785 Hyperlipidemia, unspecified: Secondary | ICD-10-CM | POA: Diagnosis present

## 2020-02-28 DIAGNOSIS — I2510 Atherosclerotic heart disease of native coronary artery without angina pectoris: Secondary | ICD-10-CM | POA: Diagnosis present

## 2020-02-28 DIAGNOSIS — I5042 Chronic combined systolic (congestive) and diastolic (congestive) heart failure: Secondary | ICD-10-CM | POA: Diagnosis present

## 2020-02-28 DIAGNOSIS — I428 Other cardiomyopathies: Secondary | ICD-10-CM | POA: Diagnosis not present

## 2020-02-28 DIAGNOSIS — E860 Dehydration: Secondary | ICD-10-CM | POA: Diagnosis present

## 2020-02-28 DIAGNOSIS — E870 Hyperosmolality and hypernatremia: Secondary | ICD-10-CM | POA: Diagnosis present

## 2020-02-28 DIAGNOSIS — I130 Hypertensive heart and chronic kidney disease with heart failure and stage 1 through stage 4 chronic kidney disease, or unspecified chronic kidney disease: Secondary | ICD-10-CM | POA: Diagnosis present

## 2020-02-28 DIAGNOSIS — Z20822 Contact with and (suspected) exposure to covid-19: Secondary | ICD-10-CM | POA: Diagnosis not present

## 2020-02-28 DIAGNOSIS — F17200 Nicotine dependence, unspecified, uncomplicated: Secondary | ICD-10-CM | POA: Diagnosis present

## 2020-02-28 DIAGNOSIS — J440 Chronic obstructive pulmonary disease with acute lower respiratory infection: Secondary | ICD-10-CM | POA: Diagnosis present

## 2020-02-28 DIAGNOSIS — I252 Old myocardial infarction: Secondary | ICD-10-CM

## 2020-02-28 DIAGNOSIS — E1149 Type 2 diabetes mellitus with other diabetic neurological complication: Secondary | ICD-10-CM | POA: Diagnosis not present

## 2020-02-28 DIAGNOSIS — W1830XD Fall on same level, unspecified, subsequent encounter: Secondary | ICD-10-CM | POA: Diagnosis not present

## 2020-02-28 DIAGNOSIS — N182 Chronic kidney disease, stage 2 (mild): Secondary | ICD-10-CM | POA: Diagnosis present

## 2020-02-28 DIAGNOSIS — I63511 Cerebral infarction due to unspecified occlusion or stenosis of right middle cerebral artery: Secondary | ICD-10-CM

## 2020-02-28 DIAGNOSIS — R451 Restlessness and agitation: Secondary | ICD-10-CM | POA: Diagnosis not present

## 2020-02-28 DIAGNOSIS — I63411 Cerebral infarction due to embolism of right middle cerebral artery: Secondary | ICD-10-CM | POA: Diagnosis present

## 2020-02-28 DIAGNOSIS — I639 Cerebral infarction, unspecified: Secondary | ICD-10-CM

## 2020-02-28 DIAGNOSIS — R29810 Facial weakness: Secondary | ICD-10-CM | POA: Diagnosis present

## 2020-02-28 DIAGNOSIS — K529 Noninfective gastroenteritis and colitis, unspecified: Secondary | ICD-10-CM

## 2020-02-28 DIAGNOSIS — E43 Unspecified severe protein-calorie malnutrition: Secondary | ICD-10-CM | POA: Diagnosis not present

## 2020-02-28 DIAGNOSIS — I340 Nonrheumatic mitral (valve) insufficiency: Secondary | ICD-10-CM | POA: Diagnosis not present

## 2020-02-28 DIAGNOSIS — E1122 Type 2 diabetes mellitus with diabetic chronic kidney disease: Secondary | ICD-10-CM | POA: Diagnosis present

## 2020-02-28 DIAGNOSIS — I480 Paroxysmal atrial fibrillation: Secondary | ICD-10-CM

## 2020-02-28 DIAGNOSIS — Z8614 Personal history of Methicillin resistant Staphylococcus aureus infection: Secondary | ICD-10-CM

## 2020-02-28 DIAGNOSIS — S0081XA Abrasion of other part of head, initial encounter: Secondary | ICD-10-CM | POA: Diagnosis present

## 2020-02-28 DIAGNOSIS — I69391 Dysphagia following cerebral infarction: Secondary | ICD-10-CM | POA: Diagnosis not present

## 2020-02-28 DIAGNOSIS — Z515 Encounter for palliative care: Secondary | ICD-10-CM

## 2020-02-28 DIAGNOSIS — I10 Essential (primary) hypertension: Secondary | ICD-10-CM

## 2020-02-28 DIAGNOSIS — R0602 Shortness of breath: Secondary | ICD-10-CM

## 2020-02-28 DIAGNOSIS — D649 Anemia, unspecified: Secondary | ICD-10-CM

## 2020-02-28 DIAGNOSIS — R64 Cachexia: Secondary | ICD-10-CM | POA: Diagnosis not present

## 2020-02-28 DIAGNOSIS — I495 Sick sinus syndrome: Secondary | ICD-10-CM | POA: Diagnosis present

## 2020-02-28 DIAGNOSIS — Z794 Long term (current) use of insulin: Secondary | ICD-10-CM

## 2020-02-28 DIAGNOSIS — Z23 Encounter for immunization: Secondary | ICD-10-CM | POA: Diagnosis not present

## 2020-02-28 DIAGNOSIS — W19XXXA Unspecified fall, initial encounter: Secondary | ICD-10-CM | POA: Diagnosis present

## 2020-02-28 DIAGNOSIS — Z79899 Other long term (current) drug therapy: Secondary | ICD-10-CM

## 2020-02-28 DIAGNOSIS — I5022 Chronic systolic (congestive) heart failure: Secondary | ICD-10-CM | POA: Diagnosis not present

## 2020-02-28 DIAGNOSIS — I160 Hypertensive urgency: Secondary | ICD-10-CM | POA: Diagnosis present

## 2020-02-28 DIAGNOSIS — Z7984 Long term (current) use of oral hypoglycemic drugs: Secondary | ICD-10-CM

## 2020-02-28 DIAGNOSIS — R0603 Acute respiratory distress: Secondary | ICD-10-CM

## 2020-02-28 DIAGNOSIS — Z681 Body mass index (BMI) 19 or less, adult: Secondary | ICD-10-CM | POA: Diagnosis not present

## 2020-02-28 DIAGNOSIS — R414 Neurologic neglect syndrome: Secondary | ICD-10-CM | POA: Diagnosis not present

## 2020-02-28 DIAGNOSIS — I361 Nonrheumatic tricuspid (valve) insufficiency: Secondary | ICD-10-CM | POA: Diagnosis not present

## 2020-02-28 DIAGNOSIS — Z96611 Presence of right artificial shoulder joint: Secondary | ICD-10-CM | POA: Diagnosis present

## 2020-02-28 DIAGNOSIS — R2689 Other abnormalities of gait and mobility: Secondary | ICD-10-CM | POA: Diagnosis not present

## 2020-02-28 DIAGNOSIS — R7881 Bacteremia: Secondary | ICD-10-CM | POA: Diagnosis not present

## 2020-02-28 DIAGNOSIS — I69354 Hemiplegia and hemiparesis following cerebral infarction affecting left non-dominant side: Secondary | ICD-10-CM

## 2020-02-28 DIAGNOSIS — M542 Cervicalgia: Secondary | ICD-10-CM | POA: Diagnosis not present

## 2020-02-28 DIAGNOSIS — I69319 Unspecified symptoms and signs involving cognitive functions following cerebral infarction: Secondary | ICD-10-CM | POA: Diagnosis not present

## 2020-02-28 DIAGNOSIS — Z951 Presence of aortocoronary bypass graft: Secondary | ICD-10-CM

## 2020-02-28 DIAGNOSIS — G936 Cerebral edema: Secondary | ICD-10-CM | POA: Diagnosis present

## 2020-02-28 DIAGNOSIS — Z7901 Long term (current) use of anticoagulants: Secondary | ICD-10-CM

## 2020-02-28 DIAGNOSIS — Z7982 Long term (current) use of aspirin: Secondary | ICD-10-CM

## 2020-02-28 DIAGNOSIS — R509 Fever, unspecified: Secondary | ICD-10-CM

## 2020-02-28 DIAGNOSIS — Z9119 Patient's noncompliance with other medical treatment and regimen: Secondary | ICD-10-CM

## 2020-02-28 DIAGNOSIS — G9341 Metabolic encephalopathy: Secondary | ICD-10-CM | POA: Diagnosis not present

## 2020-02-28 DIAGNOSIS — I513 Intracardiac thrombosis, not elsewhere classified: Secondary | ICD-10-CM | POA: Diagnosis present

## 2020-02-28 LAB — DIFFERENTIAL
Abs Immature Granulocytes: 0.03 10*3/uL (ref 0.00–0.07)
Basophils Absolute: 0 10*3/uL (ref 0.0–0.1)
Basophils Relative: 0 %
Eosinophils Absolute: 0 10*3/uL (ref 0.0–0.5)
Eosinophils Relative: 1 %
Immature Granulocytes: 0 %
Lymphocytes Relative: 16 %
Lymphs Abs: 1.3 10*3/uL (ref 0.7–4.0)
Monocytes Absolute: 0.8 10*3/uL (ref 0.1–1.0)
Monocytes Relative: 9 %
Neutro Abs: 6.4 10*3/uL (ref 1.7–7.7)
Neutrophils Relative %: 74 %

## 2020-02-28 LAB — COMPREHENSIVE METABOLIC PANEL
ALT: 26 U/L (ref 0–44)
AST: 36 U/L (ref 15–41)
Albumin: 3.7 g/dL (ref 3.5–5.0)
Alkaline Phosphatase: 84 U/L (ref 38–126)
Anion gap: 16 — ABNORMAL HIGH (ref 5–15)
BUN: 39 mg/dL — ABNORMAL HIGH (ref 8–23)
CO2: 21 mmol/L — ABNORMAL LOW (ref 22–32)
Calcium: 9.7 mg/dL (ref 8.9–10.3)
Chloride: 112 mmol/L — ABNORMAL HIGH (ref 98–111)
Creatinine, Ser: 1.55 mg/dL — ABNORMAL HIGH (ref 0.61–1.24)
GFR, Estimated: 46 mL/min — ABNORMAL LOW (ref >60)
Glucose, Bld: 175 mg/dL — ABNORMAL HIGH (ref 70–99)
Potassium: 3.9 mmol/L (ref 3.5–5.1)
Sodium: 149 mmol/L — ABNORMAL HIGH (ref 135–145)
Total Bilirubin: 0.7 mg/dL (ref 0.3–1.2)
Total Protein: 6.6 g/dL (ref 6.5–8.1)

## 2020-02-28 LAB — I-STAT CHEM 8, ED
BUN: 41 mg/dL — ABNORMAL HIGH (ref 8–23)
Calcium, Ion: 1.21 mmol/L (ref 1.15–1.40)
Chloride: 116 mmol/L — ABNORMAL HIGH (ref 98–111)
Creatinine, Ser: 1.4 mg/dL — ABNORMAL HIGH (ref 0.61–1.24)
Glucose, Bld: 165 mg/dL — ABNORMAL HIGH (ref 70–99)
HCT: 34 % — ABNORMAL LOW (ref 39.0–52.0)
Hemoglobin: 11.6 g/dL — ABNORMAL LOW (ref 13.0–17.0)
Potassium: 3.9 mmol/L (ref 3.5–5.1)
Sodium: 147 mmol/L — ABNORMAL HIGH (ref 135–145)
TCO2: 25 mmol/L (ref 22–32)

## 2020-02-28 LAB — CBC
HCT: 38.1 % — ABNORMAL LOW (ref 39.0–52.0)
Hemoglobin: 12.1 g/dL — ABNORMAL LOW (ref 13.0–17.0)
MCH: 32 pg (ref 26.0–34.0)
MCHC: 31.8 g/dL (ref 30.0–36.0)
MCV: 100.8 fL — ABNORMAL HIGH (ref 80.0–100.0)
Platelets: 219 10*3/uL (ref 150–400)
RBC: 3.78 MIL/uL — ABNORMAL LOW (ref 4.22–5.81)
RDW: 12.7 % (ref 11.5–15.5)
WBC: 8.6 10*3/uL (ref 4.0–10.5)
nRBC: 0 % (ref 0.0–0.2)

## 2020-02-28 LAB — DIGOXIN LEVEL: Digoxin Level: <0.2 ng/mL — ABNORMAL LOW (ref 1.0–2.0)

## 2020-02-28 LAB — APTT: aPTT: 23 seconds — ABNORMAL LOW (ref 24–36)

## 2020-02-28 LAB — URINALYSIS, ROUTINE W REFLEX MICROSCOPIC
Bilirubin Urine: NEGATIVE
Glucose, UA: NEGATIVE mg/dL
Ketones, ur: 20 mg/dL — AB
Leukocytes,Ua: NEGATIVE
Nitrite: NEGATIVE
Protein, ur: 100 mg/dL — AB
Specific Gravity, Urine: 1.027 (ref 1.005–1.030)
pH: 5 (ref 5.0–8.0)

## 2020-02-28 LAB — RESPIRATORY PANEL BY RT PCR (FLU A&B, COVID)
Influenza A by PCR: NEGATIVE
Influenza B by PCR: NEGATIVE
SARS Coronavirus 2 by RT PCR: NEGATIVE

## 2020-02-28 LAB — GLUCOSE, CAPILLARY
Glucose-Capillary: 130 mg/dL — ABNORMAL HIGH (ref 70–99)
Glucose-Capillary: 91 mg/dL (ref 70–99)

## 2020-02-28 LAB — ETHANOL: Alcohol, Ethyl (B): <10 mg/dL (ref <10)

## 2020-02-28 LAB — RAPID URINE DRUG SCREEN, HOSP PERFORMED
Amphetamines: NOT DETECTED
Barbiturates: NOT DETECTED
Benzodiazepines: NOT DETECTED
Cocaine: NOT DETECTED
Opiates: NOT DETECTED
Tetrahydrocannabinol: NOT DETECTED

## 2020-02-28 LAB — CBG MONITORING, ED: Glucose-Capillary: 161 mg/dL — ABNORMAL HIGH (ref 70–99)

## 2020-02-28 LAB — HEMOGLOBIN A1C
Hgb A1c MFr Bld: 6.8 % — ABNORMAL HIGH (ref 4.8–5.6)
Mean Plasma Glucose: 148.46 mg/dL

## 2020-02-28 LAB — PROTIME-INR
INR: 1.1 (ref 0.8–1.2)
Prothrombin Time: 13.6 seconds (ref 11.4–15.2)

## 2020-02-28 LAB — CK: Total CK: 745 U/L — ABNORMAL HIGH (ref 49–397)

## 2020-02-28 MED ORDER — HYDRALAZINE HCL 20 MG/ML IJ SOLN
5.0000 mg | Freq: Four times a day (QID) | INTRAMUSCULAR | Status: DC | PRN
  Administered 2020-02-29 – 2020-03-02 (×2): 5 mg via INTRAVENOUS
  Filled 2020-02-28 (×2): qty 1

## 2020-02-28 MED ORDER — LORAZEPAM 2 MG/ML IJ SOLN: 0.5000 mg | Freq: Once | INTRAMUSCULAR | Status: DC

## 2020-02-28 MED ORDER — SODIUM CHLORIDE 0.45 % IV SOLN: INTRAVENOUS | Status: DC

## 2020-02-28 MED ORDER — ATORVASTATIN CALCIUM 80 MG PO TABS
80.0000 mg | ORAL_TABLET | Freq: Every day | ORAL | Status: DC
  Administered 2020-02-29: 80 mg via ORAL
  Filled 2020-02-28 (×2): qty 1

## 2020-02-28 MED ORDER — ACETAMINOPHEN 325 MG PO TABS
650.0000 mg | ORAL_TABLET | ORAL | Status: DC | PRN
  Administered 2020-03-03 – 2020-03-04 (×2): 650 mg via ORAL
  Filled 2020-02-28 (×2): qty 2

## 2020-02-28 MED ORDER — ASPIRIN EC 81 MG PO TBEC: 81.0000 mg | DELAYED_RELEASE_TABLET | Freq: Every day | ORAL | Status: DC

## 2020-02-28 MED ORDER — APIXABAN 5 MG PO TABS
5.0000 mg | ORAL_TABLET | Freq: Two times a day (BID) | ORAL | Status: DC
  Filled 2020-02-28: qty 1

## 2020-02-28 MED ORDER — STROKE: EARLY STAGES OF RECOVERY BOOK
Freq: Once | Status: AC
  Filled 2020-02-28: qty 1

## 2020-02-28 MED ORDER — ACETAMINOPHEN 160 MG/5ML PO SOLN: 650.0000 mg | ORAL | Status: DC | PRN

## 2020-02-28 MED ORDER — HEPARIN (PORCINE) 25000 UT/250ML-% IV SOLN
850.0000 [IU]/h | INTRAVENOUS | Status: DC
  Administered 2020-02-29: 700 [IU]/h via INTRAVENOUS
  Administered 2020-03-01: 850 [IU]/h via INTRAVENOUS
  Filled 2020-02-28 (×2): qty 250

## 2020-02-28 MED ORDER — INSULIN ASPART 100 UNIT/ML ~~LOC~~ SOLN
0.0000 [IU] | Freq: Three times a day (TID) | SUBCUTANEOUS | Status: DC
  Administered 2020-02-28: 1 [IU] via SUBCUTANEOUS
  Administered 2020-02-29: 2 [IU] via SUBCUTANEOUS
  Administered 2020-03-01: 3 [IU] via SUBCUTANEOUS
  Administered 2020-03-01: 1 [IU] via SUBCUTANEOUS
  Administered 2020-03-01: 2 [IU] via SUBCUTANEOUS
  Administered 2020-03-02: 5 [IU] via SUBCUTANEOUS
  Administered 2020-03-02: 2 [IU] via SUBCUTANEOUS
  Administered 2020-03-03: 1 [IU] via SUBCUTANEOUS
  Administered 2020-03-04: 3 [IU] via SUBCUTANEOUS
  Administered 2020-03-04: 1 [IU] via SUBCUTANEOUS
  Administered 2020-03-05: 2 [IU] via SUBCUTANEOUS

## 2020-02-28 MED ORDER — ACETAMINOPHEN 650 MG RE SUPP
650.0000 mg | RECTAL | Status: DC | PRN
  Administered 2020-03-01: 650 mg via RECTAL
  Filled 2020-02-28: qty 1

## 2020-02-28 MED ORDER — ISOSORBIDE MONONITRATE ER 30 MG PO TB24: 30.0000 mg | ORAL_TABLET | Freq: Every day | ORAL | Status: DC

## 2020-02-28 MED ORDER — HYDRALAZINE HCL 50 MG PO TABS
50.0000 mg | ORAL_TABLET | Freq: Four times a day (QID) | ORAL | Status: DC
  Filled 2020-02-28: qty 1

## 2020-02-28 MED ORDER — LACTATED RINGERS IV BOLUS
1000.0000 mL | Freq: Once | INTRAVENOUS | Status: AC
  Administered 2020-02-28: 1000 mL via INTRAVENOUS

## 2020-02-28 NOTE — Progress Notes (Addendum)
HOSPITAL MEDICINE OVERNIGHT EVENT NOTE    Notified by nursing that patient has arrived to the medical floor quite agitated, pulling out lines, attempting to get out of bed.  Staff have been unable to provide care to the patient up to this point satisfactorily due to patient's agitation.  MRI has yet to be completed.  Chart reviewed, patient has a suspected subacute stroke based on initial head CT.  Per nursing serial neurologic assessment, while patient is somewhat agitated patient is seemingly more neurologically intact than at time of admission with increasing strength of the left side.  We will order telesitter and graduate to one-on-one sitter if this is unsuccessful.  Will consider as needed low-dose pharmacologic therapy on an as-needed basis for extreme agitation but will try to avoid this to not impede neurologic checks.  Ivan Elk  MD [facility]   ADDENDUM 02/29/2020 1:30AM  Notified by nursing that patient continues to exhibit worsening agitation, placing himself at risk of falling out of bed, pulling at medical devices and throwing things at staff. Unfortunately we will need to attempt a trial dose of 2.5 mg of IV Haldol.  Deno Lunger Shalhoub

## 2020-02-28 NOTE — Progress Notes (Signed)
ANTICOAGULATION CONSULT NOTE - Initial Consult  Pharmacy Consult for heparin Indication: stroke  No Known Allergies  Patient Measurements: Height: 5\' 11"  (180.3 cm) Weight: 59 kg (130 lb) IBW/kg (Calculated) : 75.3  Vital Signs: Temp: 98 F (36.7 C) (11/04 1922) Temp Source: Oral (11/04 1922) BP: 127/84 (11/04 1922) Pulse Rate: 66 (11/04 1922)  Labs: Recent Labs    02/28/20 1123 02/28/20 1409  HGB 12.1* 11.6*  HCT 38.1* 34.0*  PLT 219  --   APTT 23*  --   LABPROT 13.6  --   INR 1.1  --   CREATININE 1.55* 1.40*  CKTOTAL 745*  --     Estimated Creatinine Clearance: 36.9 mL/min (A) (by C-G formula based on SCr of 1.4 mg/dL (H)).  Assessment: CC/HPI: 79 yo m presenting with AMS and left sided weakness - found on floor after 3 days, not moving left side  PMH: ICM, HLD DM CAD   Anticoag: none pta - iv hep for acute stroke  Neuro: 11/4 CTH - subacute right MCA infarct  Renal: SCr 1.4  Heme/Onc: H&H 11.6/34, plt 219  Goal of Therapy:  Heparin level 0.3 - 0.5 units/ml Monitor platelets by anticoagulation protocol: Yes   Plan:  Heparin gtt 700 units/hr- no bolus d/t stroke Initial hep lvl in am F/u plans for oral ac  10-31-1997, PharmD, BCPS, BCCCP Clinical Pharmacist [phone]  Please check AMION for all [facility] numbers  02/28/2020 10:19 PM

## 2020-02-28 NOTE — H&P (Addendum)
History and Physical    [name] KDT:[id number] DOB: 06-14-1940 DOA: 02/28/2020  PCP: Deatra Quandarius, MD (Confirm with patient/family/NH records and if not entered, this has to be entered at [facility] point of entry) Patient coming from: Home  I have personally briefly reviewed patient's old medical records in [facility]  Chief Complaint: Left-sided weakness  HPI: [name] is a 79 y.o. 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  Patient lives by himself, with neighbors stopping by everyone as well.  Neighbor have not seen him around for 3 days and knocking on his door without response.  EMS was called, who came and found patient on the floor with right-sided facial droop and right-sided weakness.  Patient reported that, he felt left-sided weakness and fell down on Monday morning, unable to support him to stand up again feet remained on the floor for last 3 days.  Patient denied any fever chills, cough, headache or blurred vision, no chest pains.  He reported has been compliant with all his medication including digoxin and Eliquis and Brilinta. ED Course: Code stroke was called, patient was found to have bradycardia, CT head found MCA infarct with complete involvement in basal ganglia.  Review of Systems: As per HPI otherwise 14 point review of systems negative.    Past Medical History:  Diagnosis Date  . Coronary artery disease    a. 2007 dLAD 100->med rx;  b. 2011 PCI: 80 LAD (Promus 2.75x18); c. 03/2015 MV: predom fixed inf/antapical defefcts w/ ischemia, depressed EF;  c. 04/2015 NSTEMI/PCI: LM nl,LAD 40p ISR, 100d (2.5x20 Promus DES), LCX 29m (3.0x20 Promus DES), RCA 30p, 72m, 70d.   . Diabetes mellitus without complication (HCC)   . Dyslipidemia   . Hx MRSA infection   . Ischemic cardiomyopathy    a. 04/2015  Echo: EF 25-30%, apical AK, Gr 2 DD, triv AI, mild MR, mildly dil LA, PASP .  . Tobacco abuse     Past Surgical History:  Procedure Laterality Date  . CARDIAC CATHETERIZATION N/A 05/16/2015   Procedure: Left Heart Cath and Coronary Angiography;  Surgeon: Tonny Bollman, MD;  Location: [facility] INVASIVE CV LAB;  Service: Cardiovascular;  Laterality: N/A;  . CARDIAC CATHETERIZATION N/A 05/16/2015   Procedure: Coronary Stent Intervention;  Surgeon: Tonny Bollman, MD;  Location: [facility] INVASIVE CV LAB;  Service: Cardiovascular;  Laterality: N/A;  . TOTAL SHOULDER REPLACEMENT     Right     reports that he has quit smoking. He has never used smokeless tobacco. He reports that he does not drink alcohol and does not use drugs.  No Known Allergies  No family history on file.   Prior to Admission medications   Medication Sig Start Date End Date Taking? Authorizing Provider  nitroGLYCERIN (NITROSTAT) 0.4 MG SL tablet PLACE 1 TABLET UNDER THE TONGUE AS DIRECTED. Patient taking differently: Place under the tongue every 5 (five) minutes as needed for chest pain (max 5 doses).  08/25/17  Yes Rollene Rotunda, MD  aspirin 81 MG tablet Take 81 mg by mouth daily.     [provider]  atorvastatin (LIPITOR) 80 MG tablet Take 1 tablet (80 mg total) by mouth daily at 6 PM. PLEASE CONTACT OFFICE FOR ADDITIONAL REFILLS FINAL WARNING 07/12/16   Rollene Rotunda, MD  BRILINTA 90 MG TABS tablet TAKE 1 TABLET (  90 MG TOTAL) BY MOUTH 2 (TWO) TIMES DAILY. Patient not taking: Reported on 02/28/2020 06/03/16   Rollene Rotunda, MD  BRILINTA 90 MG TABS tablet TAKE 1 TABLET (90 MG TOTAL) BY MOUTH 2 (TWO) TIMES DAILY. **NEEDS OV** Patient not taking: Reported on 02/28/2020 02/14/17   Rollene Rotunda, MD  cyclobenzaprine (FLEXERIL) 5 MG tablet Take 5 mg by mouth at bedtime as needed for muscle spasms.  02/15/20   [provider]  digoxin (LANOXIN) 0.125 MG tablet Take 0.125 mg by mouth daily.  02/06/20   [provider]  ELIQUIS 5 MG TABS tablet Take 5 mg by mouth 2 (two) times daily. 02/06/20   [provider]  isosorbide mononitrate (IMDUR) 30 MG 24 hr tablet Take 30 mg by mouth daily. 02/06/20   [provider]  metFORMIN (GLUCOPHAGE) 500 MG tablet Take 500 mg by mouth 2 (two) times daily. 02/06/20   [provider]  metFORMIN (GLUMETZA) 1000 MG (MOD) 24 hr tablet Take 1,000 mg by mouth 2 (two) times daily with a meal.    [provider]  metoprolol succinate (TOPROL XL) 50 MG 24 hr tablet Take 1 tablet (50 mg total) by mouth daily. Take with or immediately following a meal. Patient not taking: Reported on 02/28/2020 06/10/15   Barrett, Joline Salt, PA-C  ramipril (ALTACE) 10 MG capsule Take 1 capsule (10 mg total) by mouth daily. Patient not taking: Reported on 02/28/2020 06/10/15   Jacinta Shoe    Physical Exam: Vitals:   02/28/20 1217 02/28/20 1315 02/28/20 1330 02/28/20 1400  BP:  (!) 162/88 (!) 179/78 (!) 190/75  Pulse:  (!) 50 (!) 48 (!) 46  Resp:  Temp:      TempSrc:      SpO2:  100% 100% 100%  Weight: 59 kg     Height:  (1.803 m)       Constitutional: NAD, calm, comfortable Vitals:   02/28/20 1217 02/28/20 1315 02/28/20 1330 02/28/20 1400  BP:  (!) 162/88 (!) 179/78 (!) 190/75  Pulse:  (!) 50 (!) 48 (!) 46  Resp:  Temp:      TempSrc:      SpO2:  100% 100% 100%  Weight: 59 kg     Height:  (1.803 m)      Eyes: PERRL, lids and conjunctivae normal, multiple bruises on left forehead and eyelid ENMT: Mucous membranes are dry. Posterior pharynx clear of any exudate or lesions.Normal dentition.  Neck: normal, supple, no masses, no thyromegaly Respiratory: clear to auscultation bilaterally, no wheezing, no crackles. Normal respiratory effort. No accessory muscle use.  Cardiovascular: Regular rate and rhythm, no murmurs / rubs / gallops. No extremity edema. 2+ pedal pulses. No carotid bruits.  Abdomen: no  tenderness, no masses palpated. No hepatosplenomegaly. Bowel sounds positive.  Musculoskeletal: no clubbing / cyanosis. No joint deformity upper and lower extremities. Good ROM, no contractures. Normal muscle tone.  Skin: no rashes, lesions, ulcers. No induration Neurologic: Left-sided facial droop, muscle strength 2/5 on left arm compared to 4/5 on the right., 3/5 on left leg compared to 5/5 on the right side. Psychiatric: Normal judgment and insight. Alert and oriented x 3. Normal mood.     Labs on Admission: I have personally reviewed following labs and imaging studies  CBC: Recent Labs  Lab 02/28/20 1123 02/28/20 1409  WBC 8.6  --   NEUTROABS 6.4  --   HGB 12.1*  11.6*  HCT 38.1* 34.0*  MCV 100.8*  --   PLT 219  --    Basic Metabolic Panel: Recent Labs  Lab 02/28/20 1123 02/28/20 1409  NA 149* 147*  K 3.9 3.9  CL 112* 116*  CO2 21*  --   GLUCOSE 175* 165*  BUN 39* 41*  CREATININE 1.55* 1.40*  CALCIUM 9.7  --    GFR: Estimated Creatinine Clearance: 36.9 mL/min (A) (by C-G formula based on SCr of 1.4 mg/dL (H)). Liver Function Tests: Recent Labs  Lab 02/28/20 1123  AST 36  ALT 26  ALKPHOS 84  BILITOT 0.7  PROT 6.6  ALBUMIN 3.7   No results for input(s): LIPASE, AMYLASE in the last 168 hours. No results for input(s): AMMONIA in the last 168 hours. Coagulation Profile: Recent Labs  Lab 02/28/20 1123  INR 1.1   Cardiac Enzymes: Recent Labs  Lab 02/28/20 1123  CKTOTAL 745*   BNP (last 3 results) No results for input(s): PROBNP in the last 8760 hours. HbA1C: No results for input(s): HGBA1C in the last 72 hours. CBG: Recent Labs  Lab 02/28/20 1121  GLUCAP 161*   Lipid Profile: No results for input(s): CHOL, HDL, LDLCALC, TRIG, CHOLHDL, LDLDIRECT in the last 72 hours. Thyroid Function Tests: No results for input(s): TSH, T4TOTAL, FREET4, T3FREE, THYROIDAB in the last 72 hours. Anemia Panel: No results for input(s): VITAMINB12, FOLATE, FERRITIN,  TIBC, IRON, RETICCTPCT in the last 72 hours. Urine analysis:    Component Value Date/Time   COLORURINE YELLOW 02/28/2020 1211   APPEARANCEUR HAZY (A) 02/28/2020 1211   LABSPEC 1.027 02/28/2020 1211   PHURINE 5.0 02/28/2020 1211   GLUCOSEU NEGATIVE 02/28/2020 1211   HGBUR SMALL (A) 02/28/2020 1211   BILIRUBINUR NEGATIVE 02/28/2020 1211   KETONESUR 20 (A) 02/28/2020 1211   PROTEINUR 100 (A) 02/28/2020 1211   NITRITE NEGATIVE 02/28/2020 1211   LEUKOCYTESUR NEGATIVE 02/28/2020 1211    Radiological Exams on Admission: CT Cervical Spine Wo Contrast  Result Date: 02/28/2020 CLINICAL DATA:  Neck trauma, weakness. EXAM: CT CERVICAL SPINE WITHOUT CONTRAST TECHNIQUE: Multidetector CT imaging of the cervical spine was performed without intravenous contrast. Multiplanar CT image reconstructions were also generated. COMPARISON:  Same day head and maxillofacial CTs. FINDINGS: Alignment: Straightening of lordosis with slight reversal. Grade 1 C2-3 and C3-4 anterolisthesis. Minimal grade 1 C5-6 retrolisthesis. Skull base and vertebrae: Vertebral body heights are preserved. Multilevel degenerative changes including endplate sclerosis, osteophytosis and Schmorl's node formation. Fusion of the bilateral C2-3 facet joints. Soft tissues and spinal canal: No prevertebral fluid or swelling. No visible canal hematoma. Disc levels: Patent bony spinal canal. Multilevel mild bony neural foraminal narrowing. Upper chest: Left apical scarring. Other: None. IMPRESSION: No acute fracture or traumatic listhesis. Multilevel spondylosis. Electronically Signed   By: Stana Bunting M.D.   On: 02/28/2020 14:47   DG Shoulder Left  Result Date: 02/28/2020 CLINICAL DATA:  Fall.  Abrasion to left shoulder. EXAM: LEFT SHOULDER - 2+ VIEW COMPARISON:  None FINDINGS: There is normal bony alignment. No evidence of acute osseous or articular abnormality. Degenerative changes of the acromioclavicular joint. IMPRESSION: No evidence of  acute osseous or articular abnormality. Electronically Signed   By: Jackey Loge DO   On: 02/28/2020 13:06   DG Knee Complete 4 Views Left  Result Date: 02/28/2020 CLINICAL DATA:  Fall. EXAM: LEFT KNEE - COMPLETE 4+ VIEW COMPARISON:  No pertinent prior exams are available for comparison. FINDINGS: There is normal bony alignment. No evidence of acute  osseous or articular abnormality. Tricompartmental osteoarthritic changes. Atherosclerotic vascular calcifications. IMPRESSION: No evidence of acute osseous or articular abnormality. Electronically Signed   By: Jackey Loge DO   On: 02/28/2020 13:08   DG Knee Complete 4 Views Right  Result Date: 02/28/2020 CLINICAL DATA:  Fall EXAM: RIGHT KNEE - COMPLETE 4+ VIEW COMPARISON:  None. FINDINGS: Alignment is anatomic. There is no acute fracture. Small joint effusion. Tricompartmental changes of osteoarthritis. Vascular calcifications. IMPRESSION: No acute fracture. Small joint effusion. Tricompartmental osteoarthritis. Electronically Signed   By: Guadlupe Spanish M.D.   On: 02/28/2020 13:08   CT HEAD CODE STROKE WO CONTRAST  Result Date: 02/28/2020 CLINICAL DATA:  79 year old male found down, last seen well 02/25/2020. left face abrasions. Code stroke activated initially, but now canceled. EXAM: CT HEAD WITHOUT CONTRAST TECHNIQUE: Contiguous axial images were obtained from the base of the skull through the vertex without intravenous contrast. COMPARISON:  [facility] hospital brain MRI 06/08/2019, head CT 06/08/2019. FINDINGS: Brain: Confluent new hypodensity throughout the right basal ganglia which are swollen with mass effect on the left lateral ventricle. No midline shift. Nearby patchy opacity compatible with cytotoxic edema involving the superior right operculum (series 3, image 21). Underlying bilateral basal ganglia dystrophic calcifications. No acute intracranial hemorrhage identified. No ventriculomegaly. Basilar cisterns remain patent. Stable chronic  cerebellar infarcts. No other acute cortically based infarct identified. Vascular: Calcified atherosclerosis at the skull base. No right side hyperdense vessel identified. However there is conspicuous hyperdensity of a left MCA branch in the sylvian fissure on coronal image 32, however, this is probably stable since last month (series 8, image 47 of the prior CT). Skull: No skull fracture or No acute osseous abnormality identified. Sinuses/Orbits: Visualized paranasal sinuses and mastoids are stable and well pneumatized. Other: Broad-based left superior convexity scalp hematoma. There is left forehead and periorbital soft tissue hematoma also. No soft tissue gas. Leftward gaze, otherwise negative orbits. ASPECTS [facility] Stroke Program Early CT Score) Total score (0-10 with 10 being normal): 7 (right side caudate, lentiform, M5 abnormal). IMPRESSION: 1. 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 Mild regional mass effect. No hemorrhagic transformation or midline shift. 2. Otherwise stable noncontrast CT appearance of the brain from last month. 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 3. Left face and scalp soft tissue hematoma. No underlying fracture identified. Electronically Signed   By: Odessa Fleming M.D.   On: 02/28/2020 11:46   CT Maxillofacial Wo Contrast  Result Date: 02/28/2020 CLINICAL DATA:  Facial trauma, weakness. EXAM: CT MAXILLOFACIAL WITHOUT CONTRAST TECHNIQUE: Multidetector CT imaging of the maxillofacial structures was performed. Multiplanar CT image reconstructions were also generated. COMPARISON:  02/28/2020 CT head. FINDINGS: Osseous: No fracture or mandibular dislocation. Left TMJ osteoarthrosis. Orbits: Globes are intact. Normal appearance of the extraocular muscles and optic nerve. No significant intraorbital fat stranding. Prominence of the left periorbital soft tissues. Sinuses: Clear paranasal sinuses.  No  mastoid effusion. Soft tissues: Mild left scalp and facial soft tissue swelling. No walled-off fluid collection. Irregularity of the left perimandibular soft tissues may reflect overlying laceration. Limited intracranial: Please see prior same day head CT. IMPRESSION: No acute osseous abnormality.  Left TMJ osteoarthrosis. Mild left scalp and face soft tissue swelling. No walled-off fluid collection. Left perimandibular soft tissue laceration. Electronically Signed   By: Stana Bunting M.D.   On: 02/28/2020 14:39    EKG: Independently reviewed.  Sinus rhythm, borderline prolonged QTC.  Assessment/Plan Active Problems:  CVA (cerebral vascular accident) (HCC)  (please populate well all problems here in Problem List. (For example, if patient is on BP meds at home and you resume or decide to hold them, it is a problem that needs to be her. Same for CAD, COPD, HLD and so on)  Subacute CVA right MCA, with left-sided paresis and aphasia -Continue aspirin and Eliquis, and CT reported no hemorrhagic transformation -Check A1c and lipid panel -UDS negative -MRI, MRA and echocardiogram -PT OT and speech evaluation  Uncontrolled hypertension -Likely from unable to take his BP meds in the last few days. 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 -Check digoxin level -No beta-blocker  IIDM -Hold Metformin, switch to sliding scale for now  HLD -High intensity statin  Chronic systolic CHF -Most recent LVEF 40-45% on [facility] record -Clinically, looks dehydrated, gentle hydration and reevaluate.  CKD stage II -Baseline creatinine 1.2-1.6 -Has signs of dehydration, start maintenance IV fluids  DVT prophylaxis: Continue Eliquis Code Status: Full code Family Communication: None at bedside Disposition Plan: Patient with significant paresis and Consults called: Neuro Admission status: Telemetry  admission   Emeline General MD [facility] Pager [phone]  02/28/2020, 3:33 PM

## 2020-02-28 NOTE — ED Provider Notes (Signed)
[facility] EMERGENCY DEPARTMENT Provider Note   CSN: [id number] Arrival date & time: 02/28/20  1119  LEVEL 5 CAVEAT - ALTERED MENTAL STATUS  History Chief Complaint  Patient presents with  . Weakness    [name] is a 79 y.o. male.  HPI 79 year old male presents as a code stroke.  History is via EMS.  Patient is not talking much.  He does whisper some things but seems a little confused on the day of the week and the month (as it is Wednesday and October).  Last seen normal 3 days ago and police were called out for a wellness check because no one had heard from him.  The door was open.  The patient was on the ground though he told EMS he fell 1 hour ago which is why they called a code stroke.  Left-sided weakness.   Past Medical History:  Diagnosis Date  . Coronary artery disease    a. 2007 dLAD 100->med rx;  b. 2011 PCI: 80 LAD (Promus 2.75x18); c. 03/2015 MV: predom fixed inf/antapical defefcts w/ ischemia, depressed EF;  c. 04/2015 NSTEMI/PCI: LM nl,LAD 40p ISR, 100d (2.5x20 Promus DES), LCX 7m (3.0x20 Promus DES), RCA 30p, 35m, 70d.   . Diabetes mellitus without complication (HCC)   . Dyslipidemia   . Hx MRSA infection   . Ischemic cardiomyopathy    a. 04/2015 Echo: EF 25-30%, apical AK, Gr 2 DD, triv AI, mild MR, mildly dil LA, PASP .  . Tobacco abuse     Patient Active Problem List   Diagnosis Date Noted  . CVA (cerebral vascular accident) (HCC) 02/28/2020  . Hypertensive heart disease 05/18/2015  . CAD (coronary artery disease) 05/18/2015  . Type II diabetes mellitus (HCC) 05/18/2015  . Cardiomyopathy, ischemic 05/18/2015  . NSTEMI (non-ST elevated myocardial infarction) (HCC) 05/16/2015  . Hyperlipidemia LDL goal <70 01/30/2010  . Essential hypertension, benign 01/30/2010  . MRSA 05/27/2008  . TOBACCO ABUSE 05/27/2008  . Coronary atherosclerosis 05/27/2008    Past Surgical History:  Procedure Laterality Date  . CARDIAC CATHETERIZATION  N/A 05/16/2015   Procedure: Left Heart Cath and Coronary Angiography;  Surgeon: Tonny Bollman, MD;  Location: [facility] INVASIVE CV LAB;  Service: Cardiovascular;  Laterality: N/A;  . CARDIAC CATHETERIZATION N/A 05/16/2015   Procedure: Coronary Stent Intervention;  Surgeon: Tonny Bollman, MD;  Location: [facility] INVASIVE CV LAB;  Service: Cardiovascular;  Laterality: N/A;  . TOTAL SHOULDER REPLACEMENT     Right       No family history on file.  Social History   Tobacco Use  . Smoking status: Former Games developer  . Smokeless tobacco: Never Used  Vaping Use  . Vaping Use: Never used  Substance Use Topics  . Alcohol use: Never    Alcohol/week: 0.0 standard drinks  . Drug use: Never    Home Medications Prior to Admission medications   Medication Sig Start Date End Date Taking? Authorizing Provider  nitroGLYCERIN (NITROSTAT) 0.4 MG SL tablet PLACE 1 TABLET UNDER THE TONGUE AS DIRECTED. Patient taking differently: Place under the tongue every 5 (five) minutes as needed for chest pain (max 5 doses).  08/25/17  Yes Rollene Rotunda, MD  aspirin 81 MG tablet Take 81 mg by mouth daily.     [provider]  atorvastatin (LIPITOR) 80 MG tablet Take 1 tablet (80 mg total) by mouth daily at 6 PM. PLEASE CONTACT OFFICE FOR ADDITIONAL REFILLS FINAL WARNING 07/12/16   Rollene Rotunda, MD  BRILINTA 90 MG  TABS tablet TAKE 1 TABLET (90 MG TOTAL) BY MOUTH 2 (TWO) TIMES DAILY. Patient not taking: Reported on 02/28/2020 06/03/16   Rollene Rotunda, MD  BRILINTA 90 MG TABS tablet TAKE 1 TABLET (90 MG TOTAL) BY MOUTH 2 (TWO) TIMES DAILY. **NEEDS OV** Patient not taking: Reported on 02/28/2020 02/14/17   Rollene Rotunda, MD  cyclobenzaprine (FLEXERIL) 5 MG tablet Take 5 mg by mouth at bedtime as needed for muscle spasms.  02/15/20   [provider]  digoxin (LANOXIN) 0.125 MG tablet Take 0.125 mg by mouth daily.  02/06/20   [provider]  ELIQUIS 5 MG TABS tablet Take 5 mg by mouth 2 (two) times daily.  02/06/20   [provider]  isosorbide mononitrate (IMDUR) 30 MG 24 hr tablet Take 30 mg by mouth daily. 02/06/20   [provider]  metFORMIN (GLUCOPHAGE) 500 MG tablet Take 500 mg by mouth 2 (two) times daily. 02/06/20   [provider]  metFORMIN (GLUMETZA) 1000 MG (MOD) 24 hr tablet Take 1,000 mg by mouth 2 (two) times daily with a meal.    [provider]  metoprolol succinate (TOPROL XL) 50 MG 24 hr tablet Take 1 tablet (50 mg total) by mouth daily. Take with or immediately following a meal. Patient not taking: Reported on 02/28/2020 06/10/15   Barrett, Joline Salt, PA-C  ramipril (ALTACE) 10 MG capsule Take 1 capsule (10 mg total) by mouth daily. Patient not taking: Reported on 02/28/2020 06/10/15   Barrett, Joline Salt, PA-C    Allergies    Patient has no known allergies.  Review of Systems   Review of Systems  Unable to perform ROS: Mental status change    Physical Exam Updated Vital Signs BP (!) 190/75   Pulse (!) 46   Temp (!) 96.7 F (35.9 C) (Rectal)   Resp 12   Ht 5\' 11"  (1.803 m)   Wt 59 kg   SpO2 100%   BMI 18.13 kg/m   Physical Exam Vitals and nursing note reviewed.  Constitutional:      Appearance: He is well-developed.  HENT:     Head: Normocephalic.     Comments: Facial abrasion/pressure injury to the left face with corresponding maxillary tenderness    Right Ear: External ear normal.     Left Ear: External ear normal.     Nose: Nose normal.     Mouth/Throat:     Mouth: Mucous membranes are dry.  Eyes:     General:        Right eye: No discharge.        Left eye: No discharge.  Cardiovascular:     Rate and Rhythm: Normal rate and regular rhythm.     Heart sounds: Normal heart sounds.  Pulmonary:     Effort: Pulmonary effort is normal.     Breath sounds: Normal breath sounds.  Abdominal:     Palpations: Abdomen is soft.     Tenderness: There is no abdominal tenderness.  Musculoskeletal:     Cervical back: Neck  supple.     Comments: Abrasion/pressure injury to the left lateral shoulder and bilateral patella.  Tenderness surrounding these.  Skin:    General: Skin is warm and dry.  Neurological:     Mental Status: He is alert. He is disoriented.     Comments: Left-sided facial droop along with left-sided weakness compared to the right sided extremities.  He is slightly disoriented to date.  Psychiatric:  Mood and Affect: Mood is not anxious.     ED Results / Procedures / Treatments   Labs (all labs ordered are listed, but only abnormal results are displayed) Labs Reviewed  APTT - Abnormal; Notable for the following components:      Result Value   aPTT 23 (*)    All other components within normal limits  CBC - Abnormal; Notable for the following components:   RBC 3.78 (*)    Hemoglobin 12.1 (*)    HCT 38.1 (*)    MCV 100.8 (*)    All other components within normal limits  COMPREHENSIVE METABOLIC PANEL - Abnormal; Notable for the following components:   Sodium 149 (*)    Chloride 112 (*)    CO2 21 (*)    Glucose, Bld 175 (*)    BUN 39 (*)    Creatinine, Ser 1.55 (*)    GFR, Estimated 46 (*)    Anion gap 16 (*)    All other components within normal limits  URINALYSIS, ROUTINE W REFLEX MICROSCOPIC - Abnormal; Notable for the following components:   APPearance HAZY (*)    Hgb urine dipstick SMALL (*)    Ketones, ur 20 (*)    Protein, ur 100 (*)    Bacteria, UA RARE (*)    All other components within normal limits  CK - Abnormal; Notable for the following components:   Total CK 745 (*)    All other components within normal limits  I-STAT CHEM 8, ED - Abnormal; Notable for the following components:   Sodium 147 (*)    Chloride 116 (*)    BUN 41 (*)    Creatinine, Ser 1.40 (*)    Glucose, Bld 165 (*)    Hemoglobin 11.6 (*)    HCT 34.0 (*)    All other components within normal limits  CBG MONITORING, ED - Abnormal; Notable for the following components:   Glucose-Capillary  161 (*)    All other components within normal limits  RESPIRATORY PANEL BY RT PCR (FLU A&B, COVID)  ETHANOL  PROTIME-INR  DIFFERENTIAL  RAPID URINE DRUG SCREEN, HOSP PERFORMED  DIGOXIN LEVEL    EKG EKG Interpretation  Date/Time:  Thursday February 28 2020 11:41:40 EDT Ventricular Rate:  58 PR Interval:    QRS Duration: 91 QT Interval:  515 QTC Calculation: 506 R Axis:   82 Text Interpretation: Sinus rhythm Probable anterior infarct, age indeterminate Prolonged QT interval similar to 2017 Confirmed by Pricilla Loveless [id number]) on 02/28/2020 3:49:28 PM   Radiology CT Cervical Spine Wo Contrast  Result Date: 02/28/2020 CLINICAL DATA:  Neck trauma, weakness. EXAM: CT CERVICAL SPINE WITHOUT CONTRAST TECHNIQUE: Multidetector CT imaging of the cervical spine was performed without intravenous contrast. Multiplanar CT image reconstructions were also generated. COMPARISON:  Same day head and maxillofacial CTs. FINDINGS: Alignment: Straightening of lordosis with slight reversal. Grade 1 C2-3 and C3-4 anterolisthesis. Minimal grade 1 C5-6 retrolisthesis. Skull base and vertebrae: Vertebral body heights are preserved. Multilevel degenerative changes including endplate sclerosis, osteophytosis and Schmorl's node formation. Fusion of the bilateral C2-3 facet joints. Soft tissues and spinal canal: No prevertebral fluid or swelling. No visible canal hematoma. Disc levels: Patent bony spinal canal. Multilevel mild bony neural foraminal narrowing. Upper chest: Left apical scarring. Other: None. IMPRESSION: No acute fracture or traumatic listhesis. Multilevel spondylosis. Electronically Signed   By: Stana Bunting M.D.   On: 02/28/2020 14:47   DG Shoulder Left  Result Date: 02/28/2020 CLINICAL DATA:  Fall.  Abrasion to left shoulder. EXAM: LEFT SHOULDER - 2+ VIEW COMPARISON:  None FINDINGS: There is normal bony alignment. No evidence of acute osseous or articular abnormality. Degenerative changes of the  acromioclavicular joint. IMPRESSION: No evidence of acute osseous or articular abnormality. Electronically Signed   By: Jackey Loge DO   On: 02/28/2020 13:06   DG Knee Complete 4 Views Left  Result Date: 02/28/2020 CLINICAL DATA:  Fall. EXAM: LEFT KNEE - COMPLETE 4+ VIEW COMPARISON:  No pertinent prior exams are available for comparison. FINDINGS: There is normal bony alignment. No evidence of acute osseous or articular abnormality. Tricompartmental osteoarthritic changes. Atherosclerotic vascular calcifications. IMPRESSION: No evidence of acute osseous or articular abnormality. Electronically Signed   By: Jackey Loge DO   On: 02/28/2020 13:08   DG Knee Complete 4 Views Right  Result Date: 02/28/2020 CLINICAL DATA:  Fall EXAM: RIGHT KNEE - COMPLETE 4+ VIEW COMPARISON:  None. FINDINGS: Alignment is anatomic. There is no acute fracture. Small joint effusion. Tricompartmental changes of osteoarthritis. Vascular calcifications. IMPRESSION: No acute fracture. Small joint effusion. Tricompartmental osteoarthritis. Electronically Signed   By: Guadlupe Spanish M.D.   On: 02/28/2020 13:08   CT HEAD CODE STROKE WO CONTRAST  Result Date: 02/28/2020 CLINICAL DATA:  79 year old male found down, last seen well 02/25/2020. left face abrasions. Code stroke activated initially, but now canceled. EXAM: CT HEAD WITHOUT CONTRAST TECHNIQUE: Contiguous axial images were obtained from the base of the skull through the vertex without intravenous contrast. COMPARISON:  [facility] hospital brain MRI 06/08/2019, head CT 06/08/2019. FINDINGS: Brain: Confluent new hypodensity throughout the right basal ganglia which are swollen with mass effect on the left lateral ventricle. No midline shift. Nearby patchy opacity compatible with cytotoxic edema involving the superior right operculum (series 3, image 21). Underlying bilateral basal ganglia dystrophic calcifications. No acute intracranial hemorrhage identified. No ventriculomegaly.  Basilar cisterns remain patent. Stable chronic cerebellar infarcts. No other acute cortically based infarct identified. Vascular: Calcified atherosclerosis at the skull base. No right side hyperdense vessel identified. However there is conspicuous hyperdensity of a left MCA branch in the sylvian fissure on coronal image 32, however, this is probably stable since last month (series 8, image 47 of the prior CT). Skull: No skull fracture or No acute osseous abnormality identified. Sinuses/Orbits: Visualized paranasal sinuses and mastoids are stable and well pneumatized. Other: Broad-based left superior convexity scalp hematoma. There is left forehead and periorbital soft tissue hematoma also. No soft tissue gas. Leftward gaze, otherwise negative orbits. ASPECTS [facility] Stroke Program Early CT Score) Total score (0-10 with 10 being normal): 7 (right side caudate, lentiform, M5 abnormal). IMPRESSION: 1. 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 Mild regional mass effect. No hemorrhagic transformation or midline shift. 2. Otherwise stable noncontrast CT appearance of the brain from last month. 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 3. Left face and scalp soft tissue hematoma. No underlying fracture identified. Electronically Signed   By: Odessa Fleming M.D.   On: 02/28/2020 11:46   CT Maxillofacial Wo Contrast  Result Date: 02/28/2020 CLINICAL DATA:  Facial trauma, weakness. EXAM: CT MAXILLOFACIAL WITHOUT CONTRAST TECHNIQUE: Multidetector CT imaging of the maxillofacial structures was performed. Multiplanar CT image reconstructions were also generated. COMPARISON:  02/28/2020 CT head. FINDINGS: Osseous: No fracture or mandibular dislocation. Left TMJ osteoarthrosis. Orbits: Globes are intact. Normal appearance of the extraocular muscles and optic nerve. No significant intraorbital fat stranding. Prominence of the left periorbital soft  tissues. Sinuses: Clear paranasal sinuses.  No mastoid effusion. Soft tissues: Mild left scalp and facial soft tissue swelling. No walled-off fluid collection. Irregularity of the left perimandibular soft tissues may reflect overlying laceration. Limited intracranial: Please see prior same day head CT. IMPRESSION: No acute osseous abnormality.  Left TMJ osteoarthrosis. Mild left scalp and face soft tissue swelling. No walled-off fluid collection. Left perimandibular soft tissue laceration. Electronically Signed   By: Stana Bunting M.D.   On: 02/28/2020 14:39    Procedures Procedures (including critical care time)  Medications Ordered in ED Medications  lactated ringers bolus 1,000 mL (1,000 mLs Intravenous New Bag/Given 02/28/20 1322)    ED Course  I have reviewed the triage vital signs and the nursing notes.  Pertinent labs & imaging results that were available during my care of the patient were reviewed by me and considered in my medical decision making (see chart for details).    MDM Rules/Calculators/A&P                          Code stroke was canceled when it became apparent that this was a subacute problem.  He was sent back for further CTs to help rule out fractures.  His images have been reviewed and are unremarkable.  There was a question of perimandibular laceration on the CT but I do not see any evidence of this on exam.  Otherwise, the patient has some acute kidney injury and will need supportive care and stroke work-up.  Hospitalist to admit. Final Clinical Impression(s) / ED Diagnoses Final diagnoses:  Acute right MCA stroke (HCC)  Acute kidney injury [facility])    Rx / DC Orders ED Discharge Orders    None       Pricilla Loveless, MD 02/28/20 1555

## 2020-02-28 NOTE — Consult Note (Addendum)
Neurology Consultation  Reason for Consult: Code stroke Referring Physician: Pricilla Loveless, MD  CC: Altered mental status and left-sided weakness  History is obtained from: EMS and son from patient  HPI: [name] is a 79 y.o. male with history of tobacco abuse, ischemic cardiomyopathy with recent concern for LV thrombus, tachy-brady syndrome, dyslipidemia, diabetes and CAD.  Patient had not been seen by neighbors for 3 days thus a well check was made by police.  Patient was found on the floor.  House appeared to be very disheveled.  Patient was very drowsy and was noted to have a left facial droop and weakness on his left side.  Patient was brought to to [facility] as code stroke.  While in route it was noted that his blood pressure was 170/90.  Heart rate was 56 bpm, SPO2 was 100 but however dropped down to 86.  With nasal cannula 2 L it went back up to 100%.  During transport he remained hard to arouse but did arouse with minor tactile stimuli.  Patient states that he actually fell 2 days ago, but then is surprised to learn that hollowing has already passed.  Thus his last known well is very unclear.  While in the ED patient labs showed sodium 149, potassium 3.9, CO2 21, glucose 175, BUN 39, creatinine 1.55, CK 745, WBC 86, INR 1.1 and PT 13.6.  He was negative for alcohol.  Head CT revealed Subacute appearing Right MCA infarct with confluent involvement of the basal ganglia. Mild regional mass effect. No hemorrhagic transformation or midline shift. Otherwise stable noncontrast CT appearance of the brain from last month. Chronic right cerebellar infarcts and prominent left MCA branch atherosclerosis  On review of outside records, (02/05/2020 [facility] visit for generalized weakness) he had a tachybradycardia syndrome and had not been taking medications for the past year because "it was too much to keep up with" there was concern for atrial flutter versus atrial fibrillation with limited treatment  options given his poor EF and heart rate.  He was planned for discharge with Zio patch but left prior to receiving his discharge paperwork or Zio patch.  Additionally he had an echocardiogram "Repeat ECHO with EF: 35-50% and persistent apical thrombus, grossly unchanged from prior."  For this he was started on apixaban.    LKW: 2 days ago tpa given?: no, out of window Premorbid modified Rankin scale (mRS): 0 NIHSS initial by D. Smith 1a Level of Conscious.: 1 1b LOC Questions: 0 1c LOC Commands: 0 2 Best Gaze: 0 3 Visual: 2 4 Facial Palsy: 1 5a Motor Arm - left: 3 5b Motor Arm - Right: 0 6a Motor Leg - Left: 2 6b Motor Leg - Right: 0 7 Limb Ataxia: 0 8 Sensory: 2 9 Best Language: 0 10 Dysarthria: 1 11 Extinct. and Inatten.: 1 TOTAL: 13  On later evaluation, his NIH stroke scale is 4 (S. Lothar Prehn) 1 for left leg weakness center  3 for left arm weakness 1 for dysarthria  Past Medical History:  Diagnosis Date  . Coronary artery disease    a. 2007 dLAD 100->med rx;  b. 2011 PCI: 80 LAD (Promus 2.75x18); c. 03/2015 MV: predom fixed inf/antapical defefcts w/ ischemia, depressed EF;  c. 04/2015 NSTEMI/PCI: LM nl,LAD 40p ISR, 100d (2.5x20 Promus DES), LCX 32m (3.0x20 Promus DES), RCA 30p, 42m, 70d.   . Diabetes mellitus without complication (HCC)   . Dyslipidemia   . Hx MRSA infection   . Ischemic cardiomyopathy  a. 04/2015 Echo: EF 25-30%, apical AK, Gr 2 DD, triv AI, mild MR, mildly dil LA, PASP .  . Tobacco abuse    No family history on file.  And all of his family is deceased  Social History:   reports that he has quit smoking. He has never used smokeless tobacco. He reports that he does not drink alcohol and does not use drugs.  Medications  Current Facility-Administered Medications:  .  lactated ringers bolus 1,000 mL, 1,000 mL, Intravenous, Once, Pricilla Loveless, MD  Current Outpatient Medications:  .  aspirin 81 MG tablet, Take 81 mg by mouth daily as needed for  pain. , Disp: , Rfl:  .  atorvastatin (LIPITOR) 80 MG tablet, Take 1 tablet (80 mg total) by mouth daily at 6 PM. PLEASE CONTACT OFFICE FOR ADDITIONAL REFILLS FINAL WARNING, Disp: 30 tablet, Rfl: 0 .  BRILINTA 90 MG TABS tablet, TAKE 1 TABLET (90 MG TOTAL) BY MOUTH 2 (TWO) TIMES DAILY., Disp: 60 tablet, Rfl: 0 .  BRILINTA 90 MG TABS tablet, TAKE 1 TABLET (90 MG TOTAL) BY MOUTH 2 (TWO) TIMES DAILY. **NEEDS OV**, Disp: 60 tablet, Rfl: 0 .  metFORMIN (GLUMETZA) 1000 MG (MOD) 24 hr tablet, Take 1,000 mg by mouth 2 (two) times daily with a meal., Disp: , Rfl:  .  metoprolol succinate (TOPROL XL) 50 MG 24 hr tablet, Take 1 tablet (50 mg total) by mouth daily. Take with or immediately following a meal., Disp: 30 tablet, Rfl: 11 .  nitroGLYCERIN (NITROSTAT) 0.4 MG SL tablet, PLACE 1 TABLET UNDER THE TONGUE AS DIRECTED., Disp: 75 tablet, Rfl: 0 .  ramipril (ALTACE) 10 MG capsule, Take 1 capsule (10 mg total) by mouth daily., Disp: 30 capsule, Rfl: 11  ROS:     General ROS: negative for - chills, fatigue, fever, night sweats, weight gain or weight loss Ophthalmic ROS: Positive for -  loss of vision ENT ROS: negative for - epistaxis, nasal discharge, oral lesions, sore throat, tinnitus or vertigo Allergy and Immunology ROS: negative for - hives or itchy/watery eyes Respiratory ROS: negative for - cough, hemoptysis, shortness of breath or wheezing Cardiovascular ROS: negative for -bradycardia Gastrointestinal ROS: negative for - abdominal pain, diarrhea, hematemesis, nausea/vomiting or stool incontinence Genito-Urinary ROS: negative for - dysuria, hematuria, incontinence or urinary frequency/urgency Musculoskeletal ROS: Positive for -  muscular weakness Neurological ROS: as noted in HPI Dermatological ROS: negative for rash and skin lesion changes  Psychiatric review of systems: Positive for passive death wish, negative for active suicidal ideation or plan  Exam: Current vital signs: BP (!) 191/86    Temp (!) 96.7 F (35.9 C) (Rectal)  Vital signs in last 24 hours: Temp:  [96.7 F (35.9 C)] 96.7 F (35.9 C) (11/04 1216) BP: (191)/(86) 191/86 (11/04 1130)   Constitutional: Unkempt, stained clothing, thin appearing Eyes: No scleral injection HENT: No OP obstruction, poor dentition Head: Significant abrasion on the left face Cardiovascular: Bradycardic, regular Respiratory: Effort normal, non-labored breathing GI: Soft.  No distension. There is no tenderness.  Skin: Significant abrasions in various stages of healing Psychiatric: Poor judgment (trying to stand up to pee on my arrival despite his significant weakness and recent falls), pleasant  Neuro: Mental Status: Patient is extremely drowsy and oftentimes needs stimulus to wake him up, alert, believes it is October in the day is Wednesday.  Speech is dysarthric but not aphasic.  Able to name objects, repeat.  Follows very simple commands; on later evaluation by attending physician, he  is more awake and alert Cranial Nerves: II: Visual fields full to confrontation III,IV, VI: EOMI without ptosis or diploplia. Pupils equal, round and reactive to light V: Facial sensation is symmetric to temperature except in the V3 distribution where he reports it is slightly reduced on the right face VII: Left facial droop VIII: hearing is intact to voice X: Palate elevates symmetrically XI: Shoulder shrug is symmetric. XII: tongue is midline without atrophy or fasciculations.  Motor: Right arm, right leg, and left leg are 5 out of 5 in confrontational testing, though control of left leg movements are erratic.  Left arm is 2/5 throughout Sensory: Reports normal sensation in the left arm and left leg with no extinction to double simultaneous stimuli Deep Tendon Reflexes: 2+ and symmetric in the biceps and patellae.  Plantars: Right toe downgoing left toe mute Cerebellar: Unable to obtain as patient is significantly somnolent  Labs I have  reviewed labs in epic and the results pertinent to this consultation are:   CBC    Component Value Date/Time   WBC 8.6 02/28/2020 1123   RBC 3.78 (L) 02/28/2020 1123   HGB 12.1 (L) 02/28/2020 1123   HCT 38.1 (L) 02/28/2020 1123   PLT 219 02/28/2020 1123   MCV 100.8 (H) 02/28/2020 1123   MCH 32.0 02/28/2020 1123   MCHC 31.8 02/28/2020 1123   RDW 12.7 02/28/2020 1123   LYMPHSABS 1.3 02/28/2020 1123   MONOABS 0.8 02/28/2020 1123   EOSABS 0.0 02/28/2020 1123   BASOSABS 0.0 02/28/2020 1123    CMP     Component Value Date/Time   NA 149 (H) 02/28/2020 1123   K 3.9 02/28/2020 1123   CL 112 (H) 02/28/2020 1123   CO2 21 (L) 02/28/2020 1123   GLUCOSE 175 (H) 02/28/2020 1123   BUN 39 (H) 02/28/2020 1123   CREATININE 1.55 (H) 02/28/2020 1123   CALCIUM 9.7 02/28/2020 1123   PROT 6.6 02/28/2020 1123   ALBUMIN 3.7 02/28/2020 1123   AST 36 02/28/2020 1123   ALT 26 02/28/2020 1123   ALKPHOS 84 02/28/2020 1123   BILITOT 0.7 02/28/2020 1123   GFRNONAA 46 (L) 02/28/2020 1123   GFRAA >60 05/18/2015 0625    Lipid Panel     Component Value Date/Time   CHOL 129 05/17/2015 0222   TRIG 113 05/17/2015 0222   HDL 33 (L) 05/17/2015 0222   CHOLHDL 3.9 05/17/2015 0222   VLDL 23 05/17/2015 0222   LDLCALC 73 05/17/2015 0222   LDLDIRECT 74.0 02/05/2010 0745     Imaging I have reviewed the images obtained:  CT-scan of the brain-Subacute appearing Right MCA infarct with confluent involvement of the basal ganglia. Mild regional mass effect. No hemorrhagic transformation or midline shift. Otherwise stable noncontrast CT appearance of the brain from last month. Chronic right cerebellar infarcts and prominent left MCA branch atherosclerosis  Felicie Morn PA-C [facility] [phone]  M-F  (9:00 am- 5:00 PM)  02/28/2020, 12:17 PM     Assessment:  This is a 79 year old male with multiple stroke risk factors who had not been seen for 3 days.  Welfare check was made and found  patient on the floor.  Patient was not moving his left side when EMS arrived.  He was also found to be bradycardic.  Code stroke was initiated.  On arrival patient had left facial droop, dysarthria, left arm flaccidity, left leg weakness and was somnolent.  CT head was obtained which did show a subacute right MCA infarct with confluent  involvement of the basal ganglia.  Patient is currently on Brilinta and aspirin.  Is unclear if patient has been taking the medication seeing that he has been found down on the floor for 2 days.   On further review of records, the mention of apical thrombus is highly concerning and an indication for urgent anticoagulation despite his recent stroke.    Impression: -Subacute appearing right MCA infarct with confluent involvement in the basal ganglia.  Recommend  #Right MCA territory ischemic infarcts, likely cardioembolic in the setting of LV apical thrombus versus atheroembolic in the setting of his significant intracranial atherosclerosis -MRI of the brain without contrast  -MRA Head and neck  -Transthoracic Echo  with contrast to optimize detection of potential LV apical thrombus -Local no bolus heparin drip per pharmacy consult -Continue home atorvastatin 80 mg/other high intensity statin --Hold antiplatelets for now, given anticoagulation and recent stroke; can be resumed on 11/4 for strong cardiac indications -BP goal: Lower blood pressure to normotensive by standard hypertensive emergency protocols  -HBAIC and Lipid profile -Telemetry monitoring -Frequent neuro checks -NPO until passes stroke swallow screen -PT/OT  #Passive death wish -Likely needs outpatient counseling and support -May benefit from palliative care consult  #Heart failure with reduced ejection fraction, left atrial apical thrombus, coronary artery disease, tachybradycardia syndrome -Appreciate primary team's management of his complex cardiac issues  # please page stroke NP  Or  PA   Or MD from 8am -4 pm  as this patient from this time will be  followed by the stroke.   You can look them up on [email or web address]  Password [email or web address]  MD addendum Agree with history, physical, assessment and plan as documented above.  I personally fully evaluated the patient in conjunction with the PA and made clarifications and additions to the note as necessary; I personally reviewed all neuroimaging obtained   Brooke Dare MD-PhD [facility] [phone]

## 2020-02-28 NOTE — ED Triage Notes (Signed)
Wellness check was called for pt-- pt family reports they hadn't heard from him in three days. Last known well- three days PTA  PD found pt on ground. EMS arrived noted left sided facial droop, left arm weakness, and pt slow to respond.  Stroke alert activated per EMS. Stroke alert cancelled by Neuro MD after dry head CT.   Pt reports he fell unsure of downtime. Pt answering most questions appropriately. Pt lethargic. Pt has noted abrasions to face, left shoulder, and bilateral knees.

## 2020-02-28 NOTE — ED Notes (Signed)
Updated daughter [name]. She reports being the primary contact for pt. Pt's daughter is coming from out of town and will be here in am. Pt updated

## 2020-02-28 NOTE — ED Notes (Signed)
Report given to 3W RN. Pt med-tele. Nurse tech transported pt to floor.

## 2020-02-28 NOTE — Code Documentation (Signed)
Stroke Response Nurse Documentation Code Documentation  [name] is a 79 y.o. male arriving to [facility] ED via [facility] on 02/28/2020 with past medical hx of CAD, DM, MI. HTN, HLD. Code stroke was activated by EMS. Patient from home where he was found down by police during a wellness check. Tentative LKW of 02/25/2020 with unk time. Code stroke called d/t question if LKW was possibly more recent. Patient drowsy o/a with L weakness and L facial droop. On aspirin 81 mg daily and Brilinta (ticagrelor) 90 mg bid. Stroke team at the bedside on patient arrival. Labs drawn and patient cleared for CT by Dr. Criss Alvine. Patient to CT with team. NIHSS 15, see documentation for details and code stroke times. Patient with decreased LOC, left hemianopia, left facial droop, left arm weakness, left leg weakness, left decreased sensation, dysarthria  and Sensory  neglect on exam. The following imaging was completed:  CT head. Patient is not a candidate for tPA due to being outside of treatment window. Will cancel code stroke per Dr. Iver Nestle. Bedside handoff with ED RN Vernona Rieger.    Sinda Du Johnson [place]  Stroke Response RN

## 2020-02-28 NOTE — Progress Notes (Signed)
Pt down to MRI. No distress or discomfort at this time.

## 2020-02-29 ENCOUNTER — Inpatient Hospital Stay (HOSPITAL_COMMUNITY): Payer: Medicare PPO

## 2020-02-29 DIAGNOSIS — I6389 Other cerebral infarction: Secondary | ICD-10-CM | POA: Diagnosis not present

## 2020-02-29 DIAGNOSIS — I361 Nonrheumatic tricuspid (valve) insufficiency: Secondary | ICD-10-CM

## 2020-02-29 DIAGNOSIS — I63411 Cerebral infarction due to embolism of right middle cerebral artery: Principal | ICD-10-CM

## 2020-02-29 LAB — CBC
HCT: 37.5 % — ABNORMAL LOW (ref 39.0–52.0)
Hemoglobin: 12.3 g/dL — ABNORMAL LOW (ref 13.0–17.0)
MCH: 32.4 pg (ref 26.0–34.0)
MCHC: 32.8 g/dL (ref 30.0–36.0)
MCV: 98.7 fL (ref 80.0–100.0)
Platelets: 204 10*3/uL (ref 150–400)
RBC: 3.8 MIL/uL — ABNORMAL LOW (ref 4.22–5.81)
RDW: 12.8 % (ref 11.5–15.5)
WBC: 8.4 10*3/uL (ref 4.0–10.5)
nRBC: 0 % (ref 0.0–0.2)

## 2020-02-29 LAB — BASIC METABOLIC PANEL
Anion gap: 14 (ref 5–15)
BUN: 32 mg/dL — ABNORMAL HIGH (ref 8–23)
CO2: 22 mmol/L (ref 22–32)
Calcium: 9.6 mg/dL (ref 8.9–10.3)
Chloride: 113 mmol/L — ABNORMAL HIGH (ref 98–111)
Creatinine, Ser: 1.35 mg/dL — ABNORMAL HIGH (ref 0.61–1.24)
GFR, Estimated: 54 mL/min — ABNORMAL LOW (ref >60)
Glucose, Bld: 89 mg/dL (ref 70–99)
Potassium: 3.7 mmol/L (ref 3.5–5.1)
Sodium: 149 mmol/L — ABNORMAL HIGH (ref 135–145)

## 2020-02-29 LAB — ECHOCARDIOGRAM COMPLETE
Area-P 1/2: 2.62 cm2
Height: 71 in
S' Lateral: 3.2 cm
Weight: 2080.01 oz

## 2020-02-29 LAB — LIPID PANEL
Cholesterol: 190 mg/dL (ref 0–200)
HDL: 57 mg/dL (ref >40)
LDL Cholesterol: 117 mg/dL — ABNORMAL HIGH (ref 0–99)
Total CHOL/HDL Ratio: 3.3 RATIO
Triglycerides: 78 mg/dL (ref <150)
VLDL: 16 mg/dL (ref 0–40)

## 2020-02-29 LAB — GLUCOSE, CAPILLARY
Glucose-Capillary: 71 mg/dL (ref 70–99)
Glucose-Capillary: 183 mg/dL — ABNORMAL HIGH (ref 70–99)
Glucose-Capillary: 100 mg/dL — ABNORMAL HIGH (ref 70–99)
Glucose-Capillary: 65 mg/dL — ABNORMAL LOW (ref 70–99)

## 2020-02-29 LAB — HEPARIN LEVEL (UNFRACTIONATED)
Heparin Unfractionated: 0.11 IU/mL — ABNORMAL LOW (ref 0.30–0.70)
Heparin Unfractionated: 0.14 IU/mL — ABNORMAL LOW (ref 0.30–0.70)

## 2020-02-29 MED ORDER — METOPROLOL TARTRATE 25 MG PO TABS: 25.0000 mg | ORAL_TABLET | Freq: Two times a day (BID) | ORAL | Status: DC

## 2020-02-29 MED ORDER — HALOPERIDOL LACTATE 5 MG/ML IJ SOLN
1.0000 mg | Freq: Once | INTRAMUSCULAR | Status: AC
  Administered 2020-02-29: 1 mg via INTRAVENOUS
  Filled 2020-02-29: qty 1

## 2020-02-29 MED ORDER — MUPIROCIN CALCIUM 2 % EX CREA
TOPICAL_CREAM | Freq: Two times a day (BID) | CUTANEOUS | Status: DC
  Administered 2020-03-05: 1 via TOPICAL
  Filled 2020-02-29 (×2): qty 15

## 2020-02-29 MED ORDER — DIGOXIN 125 MCG PO TABS: 0.1250 mg | ORAL_TABLET | Freq: Every day | ORAL | Status: DC

## 2020-02-29 MED ORDER — DEXTROSE-NACL 5-0.45 % IV SOLN: INTRAVENOUS | Status: DC

## 2020-02-29 MED ORDER — HALOPERIDOL LACTATE 5 MG/ML IJ SOLN
2.5000 mg | Freq: Once | INTRAMUSCULAR | Status: AC
  Administered 2020-02-29: 2.5 mg via INTRAVENOUS
  Filled 2020-02-29: qty 1

## 2020-02-29 MED ORDER — DEXTROSE 50 % IV SOLN
12.5000 g | INTRAVENOUS | Status: AC
  Administered 2020-02-29: 12.5 g via INTRAVENOUS
  Filled 2020-02-29: qty 50

## 2020-02-29 MED ORDER — RESOURCE THICKENUP CLEAR PO POWD
Freq: Once | ORAL | Status: AC
  Filled 2020-02-29: qty 125

## 2020-02-29 MED ORDER — IOHEXOL 350 MG/ML SOLN
75.0000 mL | Freq: Once | INTRAVENOUS | Status: AC | PRN
  Administered 2020-02-29: 75 mL via INTRAVENOUS

## 2020-02-29 NOTE — Evaluation (Signed)
Occupational Therapy Evaluation Patient Details Name: [name] MRN: [id number] DOB: Jan 26, 1941 Today's Date: 02/29/2020    History of Present Illness Pt is a 79 year old male who presented with L-sided weakness, L facial droop, and confusion after being on the ground at home. He did not receive tPA and NIH 14-16. 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 It also showed "Associated petechial hemorrhage at the right basal ganglia without frank hemorrhagic transformation or significant regional mass effect", per MRI report. Medical hx of: CAD, DM, and ischemic cardiomyopathy.   Clinical Impression   PT admitted with R MCA infarct. Pt currently with functional limitiations due to the deficits listed below (see OT problem list). Pt demonstrates safety concerns and lack of awareness to fall risk. Pt attempting to exit the bed x3 after conclusion of session with bed alarm sounding. Pt perseverating on liquid intake even after receiving liquids.  Pt will benefit from skilled OT to increase their independence and safety with adls and balance to allow discharge SNF.     Follow Up Recommendations  SNF    Equipment Recommendations  3 in 1 bedside commode;Wheelchair (measurements OT);Wheelchair cushion (measurements OT);Hospital bed    Recommendations for Other Services       Precautions / Restrictions Precautions Precautions: Fall Precaution Comments: SLP diet motivations that pt is unaware of and perseverates on water      Mobility Bed Mobility Overal bed mobility: Needs Assistance Bed Mobility: Supine to Sit;Sit to Supine     Supine to sit: Min assist Sit to supine: Mod assist   General bed mobility comments: exiting R side of the bed and then returning. pt attempting x3 exit by himself with alarm sounding with OT standing outside the room. pt rattlingrail and demanding water again    Transfers Overall transfer  level: Needs assistance   Transfers: Sit to/from Stand Sit to Stand: Mod assist         General transfer comment: support on L side and OT holding telemtry box    Balance Overall balance assessment: Needs assistance         Standing balance support: Single extremity supported;During functional activity Standing balance-Leahy Scale: Poor                             ADL either performed or assessed with clinical judgement   ADL Overall ADL's : Needs assistance/impaired Eating/Feeding: Maximal assistance Eating/Feeding Details (indicate cue type and reason): unsafe due to impulsive Grooming: Wash/dry face;Minimal assistance;Sitting   Upper Body Bathing: Moderate assistance   Lower Body Bathing: Maximal assistance   Upper Body Dressing : Moderate assistance   Lower Body Dressing: Maximal assistance   Toilet Transfer: Moderate assistance             General ADL Comments: pt sit <>Stand and side steps due to agitation this session.      Vision   Additional Comments: R gaze preference     Perception     Praxis      Pertinent Vitals/Pain Pain Assessment: No/denies pain     Hand Dominance Right   Extremity/Trunk Assessment Upper Extremity Assessment Upper Extremity Assessment: LUE deficits/detail LUE Deficits / Details: activation noted in shoulder- pt decreased shoulder flexion to 40 degrees d= LUE Coordination: decreased fine motor;decreased gross motor   Lower Extremity Assessment Lower Extremity Assessment: Defer to PT evaluation  Cervical / Trunk Assessment Cervical / Trunk Assessment: Normal   Communication Communication Communication: No difficulties   Cognition Arousal/Alertness: Awake/alert Behavior During Therapy: Impulsive;Agitated Overall Cognitive Status: Impaired/Different from baseline Area of Impairment: Attention;Memory;Following commands;Safety/judgement;Awareness;Problem solving                   Current  Attention Level: Sustained Memory: Decreased recall of precautions;Decreased short-term memory Following Commands: Follows one step commands inconsistently;Follows one step commands with increased time Safety/Judgement: Decreased awareness of safety;Decreased awareness of deficits Awareness: Emergent Problem Solving: Slow processing;Decreased initiation;Difficulty sequencing;Requires verbal cues;Requires tactile cues General Comments: pt fixated on wanting water and even when staff are acquiring information in chart and speaking aloud about looking at [facility] slp pt talks over therapist i want water. pt provided spoon of water with aspirations and demanding more water to "wash it down" pt lacks any awareness to aspiration. pt taking the telemetry box and slamming it again the bed rail in response to yelling i need water. Pt provided an entire cup of apple juice nectar thick via spoon and continues to perseverate on liquids without awareness to just finsihing the drink. pt is able to states name and location [facility]   General Comments  noted to have wounds on bil knees and L shoulder without bandages    Exercises     Shoulder Instructions      Home Living Family/patient expects to be discharged to:: Private residence Living Arrangements: Alone Available Help at Discharge: Neighbor;Available 24 hours/day Type of Home: House Home Access: Stairs to enter [employer] of Steps: 2 Entrance Stairs-Rails: None Home Layout: Two level;Able to live on main level with bedroom/bathroom Alternate Level Stairs-Number of Steps: 10 Alternate Level Stairs-Rails: Can reach both Bathroom Shower/Tub: Tub/shower unit;Curtain   Bathroom Toilet: Standard Bathroom Accessibility: Yes   Home Equipment: Grab bars - toilet;Cane - single point;Wheelchair - manual          Prior Functioning/Environment Level of Independence: Independent with assistive device(s)         Comments: Pt drives.        OT Problem List: Decreased strength;Impaired balance (sitting and/or standing);Decreased activity tolerance;Decreased coordination;Decreased cognition;Decreased safety awareness;Decreased knowledge of use of DME or AE;Decreased knowledge of precautions;Impaired UE functional use      OT Treatment/Interventions: Self-care/ADL training;Therapeutic exercise;Neuromuscular education;Energy conservation;DME and/or AE instruction;Manual therapy;Therapeutic activities;Cognitive remediation/compensation;Patient/family education;Balance training;Visual/perceptual remediation/compensation    OT Goals(Current goals can be found in the care plan section) Acute Rehab OT Goals Patient Stated Goal: to have water OT Goal Formulation: Patient unable to participate in goal setting Time For Goal Achievement: 03/14/20 Potential to Achieve Goals: Good  OT Frequency: Min 2X/week   Barriers to D/C: Decreased caregiver support          Co-evaluation              AM-PAC OT "6 Clicks" Daily Activity     Outcome Measure Help from another person eating meals?: A Little Help from another person taking care of personal grooming?: A Little Help from another person toileting, which includes using toliet, bedpan, or urinal?: A Lot Help from another person bathing (including washing, rinsing, drying)?: A Lot Help from another person to put on and taking off regular upper body clothing?: A Little Help from another person to put on and taking off regular lower body clothing?: A Lot 6 Click Score: 15   End of Session Nurse Communication: Mobility status;Precautions  Activity Tolerance: Patient tolerated treatment well  Patient left: with call bell/phone within reach;in bed  OT Visit Diagnosis: Unsteadiness on feet (R26.81);Muscle weakness (generalized) (M62.81)                Time: 3500-9381 OT Time Calculation (min): 23 min Charges:  OT General Charges $OT Visit: 1 Visit OT  Evaluation $OT Eval Moderate Complexity: 1 Mod   Brynn, OTR/L  Acute Rehabilitation Services Pager: [phone] Office: [phone] .   Mateo Flow 02/29/2020, 4:28 PM

## 2020-02-29 NOTE — Progress Notes (Signed)
Modified Barium Swallow Progress Note  Patient Details  Name: [name] MRN: [id number] Date of Birth: 05/25/1940  Today's Date: 02/29/2020  Modified Barium Swallow completed.  Full report located under Chart Review in the Imaging Section.  Brief recommendations include the following:  Clinical Impression  Pt presents with a mild oropharyngeal dysphagia marked primarily by reduced sensorimotor function within the oral phase.  There is reduced labial seal, leading to leaking of liquid boluses from the left side of mouth; disorganized bolus formation and lingual pumping, retention in the left oral cavity post-swallow (requiring oral suctioning at the end of the study).  Thin liquids spilled into the laryngeal vestibule and were aspirated before and during the swallow. Pt had an explosive cough in reaction to aspiration.  Nectar thick liquids were swallowed safely with no penetration nor aspiration over repeated trials. There was little to no pharyngeal residue post -swallow.  For now, recommend beginning a dysphagia 1 diet with nectar thick liquids.  Pt may have small sips of water outside meals and after oral care.  Crush meds for now.  SLP will follow and can advance diet based on clinical observations without repeating MBS.    Swallow Evaluation Recommendations       SLP Diet Recommendations: Dysphagia 1 (Puree) solids;Nectar thick liquid   Liquid Administration via: Cup;Straw   Medication Administration: Crushed with puree   Supervision: Patient able to self feed;Full supervision/cueing for compensatory strategies           Oral Care Recommendations: Oral care BID   Other Recommendations: Order thickener from pharmacy   Ivan Timothy L. Samson Frederic, MA CCC/SLP Acute Rehabilitation Services Office number [phone] Pager [phone]  Blenda Mounts Laurice 02/29/2020,3:10 PM

## 2020-02-29 NOTE — Progress Notes (Signed)
Updated patient's daughter Kennith [place] @ [phone], states that she is on her way to hospital from  [place].

## 2020-02-29 NOTE — Consult Note (Signed)
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  Per chart review patient lives alone.  Two-level home bed and bath main level 2 steps to entry.  Independent with a straight point cane.  Presented 02/28/2020 with left-sided weakness.  Cranial CT/MRI scan showed subacute appearing right MCA infarction with confluent involvement of the basal ganglia.  Mild regional mass-effect.  No hemorrhage.  MRA of head and neck showed severe high-grade stenosis or possibly partially occlusive and/or recanalized thrombus within the proximal right M1 segment.  Patient did not receive TPA.  Admission chemistries alcohol negative, sodium 149, glucose 175, BUN 39, creatinine 1.55, CK 745, urine drug screen negative, hemoglobin A1c 6.8.  Echocardiogram pending.  Neurology follow-up currently ongoing awaiting plan on anticoagulation.  Therapy evaluations completed with recommendations of physical medicine rehab consult.   Review of Systems  Constitutional: Negative for chills and fever.  HENT: Negative for hearing loss.   Eyes: Negative for blurred vision and double vision.  Respiratory: Negative for cough and shortness of breath.   Cardiovascular: Negative for chest pain and palpitations.  Gastrointestinal: Positive for constipation. Negative for heartburn, nausea and vomiting.  Genitourinary: Negative for dysuria, flank pain and hematuria.  Musculoskeletal: Positive for joint pain and myalgias.  Skin: Negative for rash.  All other systems reviewed and are negative.  Past Medical History:  Diagnosis Date  . Coronary artery disease    a. 2007 dLAD 100->med  rx;  b. 2011 PCI: 80 LAD (Promus 2.75x18); c. 03/2015 MV: predom fixed inf/antapical defefcts w/ ischemia, depressed EF;  c. 04/2015 NSTEMI/PCI: LM nl,LAD 40p ISR, 100d (2.5x20 Promus DES), LCX 23m (3.0x20 Promus DES), RCA 30p, 60m, 70d.   . Diabetes mellitus without complication (HCC)   . Dyslipidemia   . Hx MRSA infection   . Ischemic cardiomyopathy    a. 04/2015 Echo: EF 25-30%, apical AK, Gr 2 DD, triv AI, mild MR, mildly dil LA, PASP .  . Tobacco abuse    Past Surgical History:  Procedure Laterality Date  . CARDIAC CATHETERIZATION N/A 05/16/2015   Procedure: Left Heart Cath and Coronary Angiography;  Surgeon: Tonny Bollman, MD;  Location: [facility] INVASIVE CV LAB;  Service: Cardiovascular;  Laterality: N/A;  . CARDIAC CATHETERIZATION N/A 05/16/2015   Procedure: Coronary Stent Intervention;  Surgeon: Tonny Bollman, MD;  Location: [facility] INVASIVE CV LAB;  Service: Cardiovascular;  Laterality: N/A;  . TOTAL SHOULDER REPLACEMENT     Right   No family history on file. Social History:  reports that he has quit smoking. He has never used smokeless tobacco. He reports that he does not drink alcohol and does not use drugs. Allergies: No Known Allergies Medications Prior to Admission  Medication Sig Dispense Refill  . aspirin 81 MG tablet Take 81 mg by mouth daily.     Marland Kitchen atorvastatin (LIPITOR) 80 MG tablet Take 1 tablet (80 mg total) by mouth daily at 6 PM. PLEASE CONTACT OFFICE FOR ADDITIONAL REFILLS FINAL WARNING 30 tablet 0  . cyclobenzaprine (FLEXERIL) 5 MG tablet Take 5 mg by mouth at bedtime as needed for muscle spasms.     . digoxin (LANOXIN) 0.125 MG tablet Take 0.125 mg by mouth daily.     Marland Kitchen  ELIQUIS 5 MG TABS tablet Take 5 mg by mouth 2 (two) times daily.    . isosorbide mononitrate (IMDUR) 30 MG 24 hr tablet Take 30 mg by mouth daily.    . metFORMIN (GLUCOPHAGE) 500 MG tablet Take 500 mg by mouth 2 (two) times daily.    . nitroGLYCERIN (NITROSTAT) 0.4 MG SL tablet PLACE 1 TABLET UNDER THE  TONGUE AS DIRECTED. (Patient taking differently: Place under the tongue every 5 (five) minutes as needed for chest pain (max 5 doses). ) 75 tablet 0  . BRILINTA 90 MG TABS tablet TAKE 1 TABLET (90 MG TOTAL) BY MOUTH 2 (TWO) TIMES DAILY. (Patient not taking: Reported on 02/28/2020) 60 tablet 0  . BRILINTA 90 MG TABS tablet TAKE 1 TABLET (90 MG TOTAL) BY MOUTH 2 (TWO) TIMES DAILY. **NEEDS OV** (Patient not taking: Reported on 02/28/2020) 60 tablet 0  . metoprolol succinate (TOPROL XL) 50 MG 24 hr tablet Take 1 tablet (50 mg total) by mouth daily. Take with or immediately following a meal. (Patient not taking: Reported on 02/28/2020) 30 tablet 11  . ramipril (ALTACE) 10 MG capsule Take 1 capsule (10 mg total) by mouth daily. (Patient not taking: Reported on 02/28/2020) 30 capsule 11    Home: Home Living Family/patient expects to be discharged to:: Private residence Living Arrangements: Alone Available Help at Discharge: Neighbor, Available 24 hours/day (per pt, but per chart appears to be intermittent check up) Type of Home: House Home Access: Stairs to enter [employer] of Steps: 2 Entrance Stairs-Rails: None Home Layout: Two level, Able to live on main level with bedroom/bathroom Alternate Level Stairs-Number of Steps: 10 Alternate Level Stairs-Rails: Can reach both Bathroom Shower/Tub: Tub/shower unit, Engineer, building services: Standard Bathroom Accessibility: Yes Home Equipment: Grab bars - toilet, Cane - single point, Wheelchair - manual (uses w/c in home, SPC in community)  Functional History: Prior Function Level of Independence: Independent with assistive device(s) Comments: Pt drives. Functional Status:  Mobility: Bed Mobility Overal bed mobility: Needs Assistance Bed Mobility: Supine to Sit, Sit to Supine Supine to sit: Min assist Sit to supine: Min assist General bed mobility comments: Pt able to initiate moving B LEs towards EOB and pulling on side of bed to assist  with trunk ascension, but required TCs and VCs to manage L LE fully off EOB and to complete trunk ascension. MinA to manage L LE superiorly onto bed. Transfers Overall transfer level: Needs assistance Equipment used: 1 person hand held assist Transfers: Sit to/from Stand Sit to Stand: Mod assist General transfer comment: ModA and extra time to power up to stand, cues for proper hand placement as he tried to grab therapit's leg or arm to pull up. Ambulation/Gait Ambulation/Gait assistance: Mod assist Gait Distance (Feet): 5 Feet Assistive device: 1 person hand held assist Gait Pattern/deviations: Step-to pattern, Decreased step length - left, Decreased stance time - left, Decreased stance time - right, Decreased stride length, Shuffle, Narrow base of support General Gait Details: Requires TCs and VCs to correct decreased L stance time and L step length as he tends to shuffle his B feet, esp demonstrating difficulty with advancing L LE. R UE support on therapist, modA to maintain balance. Gait velocity: decreased Gait velocity interpretation: <1.31 ft/sec, indicative of household ambulator    ADL:    Cognition: Cognition Overall Cognitive Status: Impaired/Different from baseline Orientation Level: Oriented to person, Disoriented to situation, Disoriented to time, Disoriented to place Cognition Arousal/Alertness: Awake/alert Behavior During Therapy: Agitated, Impulsive Overall Cognitive  Status: Impaired/Different from baseline Area of Impairment: Attention, Memory, Following commands, Safety/judgement, Awareness, Problem solving Current Attention Level: Sustained Memory: Decreased recall of precautions, Decreased short-term memory Following Commands: Follows one step commands inconsistently, Follows one step commands with increased time Safety/Judgement: Decreased awareness of safety, Decreased awareness of deficits Awareness: Emergent Problem Solving: Slow processing, Decreased  initiation, Difficulty sequencing, Requires verbal cues, Requires tactile cues General Comments: Pt fixated on wanting water this date and required repeated cues and continual conversation to educate pt on safety concerns with swallowing at this time. Required step-by-step cues to sequence mobility and to maintain safety. Pt attempts to grab therapist, non-aggresively, at times, and requires repeated cues to avoid this.  Blood pressure (!) 166/78, pulse 62, temperature 98.4 F (36.9 C), temperature source Axillary, resp. rate 18, height 5\' 11"  (1.803 m), weight 59 kg, SpO2 100 %.  General: Very somnolent, No apparent distress HEENT: Head is normocephalic, atraumatic, PERRLA, EOMI, sclera anicteric, oral mucosa pink and moist, dentition intact, ext ear canals clear,  Neck: Supple without JVD or lymphadenopathy Heart: Reg rate and rhythm. No murmurs rubs or gallops Chest: CTA bilaterally without wheezes, rales, or rhonchi; no distress Abdomen: Soft, non-tender, non-distended, bowel sounds positive. Extremities: No clubbing, cyanosis, or edema. Pulses are 2+ Skin: Clean and intact without signs of breakdown Neuro: Patient is alert.  Left facial droop. Impaired tongue deviation. Mood is a bit flat but appropriate.  Oriented x3 and follows commands. Right sided strength 5/5. Left leg 5/5. Left arm 2/5 throughout.   Psych: Pt's affect is appropriate. Pt is cooperative  Results for orders placed or performed during the hospital encounter of 02/28/20 (from the past 24 hour(s))  CBG monitoring, ED     Status: Abnormal   Collection Time: 02/28/20 11:21 AM  Result Value Ref Range   Glucose-Capillary 161 (H) 70 - 99 mg/dL  Ethanol     Status: None   Collection Time: 02/28/20 11:23 AM  Result Value Ref Range   Alcohol, Ethyl (B) <10 <10 mg/dL  Protime-INR     Status: None   Collection Time: 02/28/20 11:23 AM  Result Value Ref Range   Prothrombin Time 13.6 11.4 - 15.2 seconds   INR 1.1 0.8 - 1.2   APTT     Status: Abnormal   Collection Time: 02/28/20 11:23 AM  Result Value Ref Range   aPTT 23 (L) 24 - 36 seconds  CBC     Status: Abnormal   Collection Time: 02/28/20 11:23 AM  Result Value Ref Range   WBC 8.6 4.0 - 10.5 K/uL   RBC 3.78 (L) 4.22 - 5.81 MIL/uL   Hemoglobin 12.1 (L) 13.0 - 17.0 g/dL   HCT 87.8 (L) 39 - 52 %   MCV 100.8 (H) 80.0 - 100.0 fL   MCH 32.0 26.0 - 34.0 pg   MCHC 31.8 30.0 - 36.0 g/dL   RDW 67.6 72.0 - 94.7 %   Platelets 219 150 - 400 K/uL   nRBC 0.0 0.0 - 0.2 %  Differential     Status: None   Collection Time: 02/28/20 11:23 AM  Result Value Ref Range   Neutrophils Relative % 74 %   Neutro Abs 6.4 1.7 - 7.7 K/uL   Lymphocytes Relative 16 %   Lymphs Abs 1.3 0.7 - 4.0 K/uL   Monocytes Relative 9 %   Monocytes Absolute 0.8 0.1 - 1.0 K/uL   Eosinophils Relative 1 %   Eosinophils Absolute 0.0 0.0 - 0.5 K/uL  Basophils Relative 0 %   Basophils Absolute 0.0 0.0 - 0.1 K/uL   Immature Granulocytes 0 %   Abs Immature Granulocytes 0.03 0.00 - 0.07 K/uL  Comprehensive metabolic panel     Status: Abnormal   Collection Time: 02/28/20 11:23 AM  Result Value Ref Range   Sodium 149 (H) 135 - 145 mmol/L   Potassium 3.9 3.5 - 5.1 mmol/L   Chloride 112 (H) 98 - 111 mmol/L   CO2 21 (L) 22 - 32 mmol/L   Glucose, Bld 175 (H) 70 - 99 mg/dL   BUN 39 (H) 8 - 23 mg/dL   Creatinine, Ser 9.60 (H) 0.61 - 1.24 mg/dL   Calcium 9.7 8.9 - 45.4 mg/dL   Total Protein 6.6 6.5 - 8.1 g/dL   Albumin 3.7 3.5 - 5.0 g/dL   AST 36 15 - 41 U/L   ALT 26 0 - 44 U/L   Alkaline Phosphatase 84 38 - 126 U/L   Total Bilirubin 0.7 0.3 - 1.2 mg/dL   GFR, Estimated 46 (L) >60 mL/min   Anion gap 16 (H) 5 - 15  CK     Status: Abnormal   Collection Time: 02/28/20 11:23 AM  Result Value Ref Range   Total CK 745 (H) 49.0 - 397.0 U/L  Urine rapid drug screen (hosp performed)     Status: None   Collection Time: 02/28/20 12:11 PM  Result Value Ref Range   Opiates NONE DETECTED NONE DETECTED    Cocaine NONE DETECTED NONE DETECTED   Benzodiazepines NONE DETECTED NONE DETECTED   Amphetamines NONE DETECTED NONE DETECTED   Tetrahydrocannabinol NONE DETECTED NONE DETECTED   Barbiturates NONE DETECTED NONE DETECTED  Urinalysis, Routine w reflex microscopic Urine, Clean Catch     Status: Abnormal   Collection Time: 02/28/20 12:11 PM  Result Value Ref Range   Color, Urine YELLOW YELLOW   APPearance HAZY (A) CLEAR   Specific Gravity, Urine 1.027 1.005 - 1.030   pH 5.0 5.0 - 8.0   Glucose, UA NEGATIVE NEGATIVE mg/dL   Hgb urine dipstick SMALL (A) NEGATIVE   Bilirubin Urine NEGATIVE NEGATIVE   Ketones, ur 20 (A) NEGATIVE mg/dL   Protein, ur 098 (A) NEGATIVE mg/dL   Nitrite NEGATIVE NEGATIVE   Leukocytes,Ua NEGATIVE NEGATIVE   RBC / HPF 0-5 0 - 5 RBC/hpf   WBC, UA 0-5 0 - 5 WBC/hpf   Bacteria, UA RARE (A) NONE SEEN   Squamous Epithelial / LPF 0-5 0 - 5   Mucus PRESENT   Respiratory Panel by RT PCR (Flu A&B, Covid) - Nasopharyngeal Swab     Status: None   Collection Time: 02/28/20  1:21 PM   Specimen: Nasopharyngeal Swab  Result Value Ref Range   SARS Coronavirus 2 by RT PCR NEGATIVE NEGATIVE   Influenza A by PCR NEGATIVE NEGATIVE   Influenza B by PCR NEGATIVE NEGATIVE  I-stat chem 8, ED     Status: Abnormal   Collection Time: 02/28/20  2:09 PM  Result Value Ref Range   Sodium 147 (H) 135 - 145 mmol/L   Potassium 3.9 3.5 - 5.1 mmol/L   Chloride 116 (H) 98 - 111 mmol/L   BUN 41 (H) 8 - 23 mg/dL   Creatinine, Ser 1.19 (H) 0.61 - 1.24 mg/dL   Glucose, Bld 147 (H) 70 - 99 mg/dL   Calcium, Ion 8.29 5.62 - 1.40 mmol/L   TCO2 25 22 - 32 mmol/L   Hemoglobin 11.6 (L) 13.0 - 17.0 g/dL  HCT 34.0 (L) 39 - 52 %  Digoxin level     Status: Abnormal   Collection Time: 02/28/20  5:04 PM  Result Value Ref Range   Digoxin Level <0.2 (L) 1.0 - 2.0 ng/mL  Hemoglobin A1c     Status: Abnormal   Collection Time: 02/28/20  5:04 PM  Result Value Ref Range   Hgb A1c MFr Bld 6.8 (H) 4.8 - 5.6  %   Mean Plasma Glucose 148.46 mg/dL  Glucose, capillary     Status: Abnormal   Collection Time: 02/28/20  5:25 PM  Result Value Ref Range   Glucose-Capillary 130 (H) 70 - 99 mg/dL  Glucose, capillary     Status: None   Collection Time: 02/28/20  9:18 PM  Result Value Ref Range   Glucose-Capillary 91 70 - 99 mg/dL  Lipid panel     Status: Abnormal   Collection Time: 02/29/20  2:06 AM  Result Value Ref Range   Cholesterol 190 0 - 200 mg/dL   Triglycerides 78 <175 mg/dL   HDL 57 >10 mg/dL   Total CHOL/HDL Ratio 3.3 RATIO   VLDL 16 0 - 40 mg/dL   LDL Cholesterol 258 (H) 0 - 99 mg/dL  Basic metabolic panel     Status: Abnormal   Collection Time: 02/29/20  2:06 AM  Result Value Ref Range   Sodium 149 (H) 135 - 145 mmol/L   Potassium 3.7 3.5 - 5.1 mmol/L   Chloride 113 (H) 98 - 111 mmol/L   CO2 22 22 - 32 mmol/L   Glucose, Bld 89 70 - 99 mg/dL   BUN 32 (H) 8 - 23 mg/dL   Creatinine, Ser 5.27 (H) 0.61 - 1.24 mg/dL   Calcium 9.6 8.9 - 78.2 mg/dL   GFR, Estimated 54 (L) >60 mL/min   Anion gap 14 5 - 15  Heparin level (unfractionated)     Status: Abnormal   Collection Time: 02/29/20  2:06 AM  Result Value Ref Range   Heparin Unfractionated 0.11 (L) 0.30 - 0.70 IU/mL  CBC     Status: Abnormal   Collection Time: 02/29/20  2:06 AM  Result Value Ref Range   WBC 8.4 4.0 - 10.5 K/uL   RBC 3.80 (L) 4.22 - 5.81 MIL/uL   Hemoglobin 12.3 (L) 13.0 - 17.0 g/dL   HCT 42.3 (L) 39 - 52 %   MCV 98.7 80.0 - 100.0 fL   MCH 32.4 26.0 - 34.0 pg   MCHC 32.8 30.0 - 36.0 g/dL   RDW 53.6 14.4 - 31.5 %   Platelets 204 150 - 400 K/uL   nRBC 0.0 0.0 - 0.2 %   DG Chest 2 View  Result Date: 02/28/2020 CLINICAL DATA:  CVA EXAM: CHEST - 2 VIEW COMPARISON:  02/05/2020 FINDINGS: Prior median sternotomy. Heart and mediastinal contours are within normal limits. No focal opacities or effusions. No acute bony abnormality. IMPRESSION: No active cardiopulmonary disease. Electronically Signed   By: Charlett Nose  M.D.   On: 02/28/2020 19:06   CT Cervical Spine Wo Contrast  Result Date: 02/28/2020 CLINICAL DATA:  Neck trauma, weakness. EXAM: CT CERVICAL SPINE WITHOUT CONTRAST TECHNIQUE: Multidetector CT imaging of the cervical spine was performed without intravenous contrast. Multiplanar CT image reconstructions were also generated. COMPARISON:  Same day head and maxillofacial CTs. FINDINGS: Alignment: Straightening of lordosis with slight reversal. Grade 1 C2-3 and C3-4 anterolisthesis. Minimal grade 1 C5-6 retrolisthesis. Skull base and vertebrae: Vertebral body heights are preserved. Multilevel degenerative changes  including endplate sclerosis, osteophytosis and Schmorl's node formation. Fusion of the bilateral C2-3 facet joints. Soft tissues and spinal canal: No prevertebral fluid or swelling. No visible canal hematoma. Disc levels: Patent bony spinal canal. Multilevel mild bony neural foraminal narrowing. Upper chest: Left apical scarring. Other: None. IMPRESSION: No acute fracture or traumatic listhesis. Multilevel spondylosis. Electronically Signed   By: Stana Bunting M.D.   On: 02/28/2020 14:47   MR ANGIO HEAD WO CONTRAST  Result Date: 02/29/2020 CLINICAL DATA:  Follow-up examination for acute stroke. EXAM: MRI HEAD WITHOUT CONTRAST MRA HEAD WITHOUT CONTRAST MRA NECK WITHOUT CONTRAST TECHNIQUE: Multiplanar, multiecho pulse sequences of the brain and surrounding structures were obtained without intravenous contrast. Angiographic images of the Circle of Willis were obtained using MRA technique without intravenous contrast. Angiographic images of the neck were obtained using MRA technique without intravenous contrast. Carotid stenosis measurements (when applicable) are obtained utilizing NASCET criteria, using the distal internal carotid diameter as the denominator. COMPARISON:  Prior CT from 02/28/2020. FINDINGS: MRI HEAD FINDINGS Brain: Generalized age-related cerebral atrophy. Patchy T2/FLAIR  hyperintensity within the periventricular and deep white matter both cerebral hemispheres most consistent with chronic small vessel ischemic disease, mild to moderate in nature. Multiple scattered remote cerebellar infarcts noted, right greater than left. Associated scattered foci of chronic hemosiderin staining. Confluent diffusion abnormality involving the right basal ganglia consistent with an acute to subacute right MCA territory infarct. Associated susceptibility artifact compatible with petechial hemorrhage without frank hemorrhagic transformation. Additional patchy cortical and subcortical infarcts noted within the adjacent right frontal operculum. Findings correspond with abnormality on prior CT. Mild localized swelling at the right basal ganglia with partial effacement of the right lateral ventricle without significant midline shift or regional mass effect. No mass lesion. No hydrocephalus or extra-axial fluid collection. Pituitary gland and suprasellar region normal. Vascular: Major intracranial vascular flow voids are maintained. Skull and upper cervical spine: Advanced degenerative changes noted about the dens and C1-2 articulation with degenerative thickening at the tectorial membrane. Resultant mild spinal stenosis at the cervicomedullary junction without frank cord impingement. Bone marrow signal intensity within normal limits. No scalp soft tissue abnormality. Sinuses/Orbits: Globes and orbital soft tissues within normal limits. Paranasal sinuses are largely clear. No mastoid effusion. Inner ear structures grossly normal. Other: None. MRA HEAD FINDINGS ANTERIOR CIRCULATION: Visualized distal cervical segments of the internal carotid arteries are patent with antegrade flow. Petrous, cavernous, and supraclinoid segments patent without stenosis or other abnormality. A1 segments patent bilaterally. Normal anterior communicating artery complex. Anterior cerebral arteries patent to their distal aspects  without stenosis. Left M1 widely patent. Normal left MCA bifurcation. Distal left MCA branches well perfused. On the right, there is focal attenuation of the proximal right M1 segment, measuring 5 mm in length (series 1040, image 10). Finding could reflect a severe high-grade stenosis or possibly partially occlusive and/or recanalized thrombus. No definite corresponding FLAIR signal abnormality seen at this level on corresponding brain MRI. Right M1 and distal MCA branches are patent distally but are markedly attenuated. No visible proximal M2 occlusion. POSTERIOR CIRCULATION: Vertebral arteries patent to the vertebrobasilar junction without stenosis. Right PICA patent. Left PICA not definitely seen. Basilar patent to its distal aspect without stenosis. Superior cerebral arteries patent bilaterally. Left PCA primarily supplied via the basilar. Right PCA supplied via a hypoplastic right P1 segment and robust right posterior communicating artery. Right PCA widely patent to its distal aspect. Moderate stenosis involving the mid left P2 segment noted (series 1034, image 16). Left PCA  otherwise patent distally. No aneurysm. MRA NECK FINDINGS Examination severely limited as the patient was unable to tolerate the full length of the exam, and terminated the study early. Partially visualized aortic arch grossly within normal limits for caliber. No obvious stenosis about the origin of the great vessels. Both carotid artery systems appear grossly patent within the neck. Vertebral arteries appear grossly patent as well. Evaluation for possible flow-limiting stenosis or other vascular pathology severely limited. IMPRESSION: MRI HEAD IMPRESSION: 1. 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 Associated petechial hemorrhage at the right basal ganglia without frank hemorrhagic transformation or significant regional mass effect. 2. Underlying age-related  cerebral atrophy with chronic small vessel ischemic disease, with multiple scattered remote cerebellar infarcts, right greater than left. MRA HEAD IMPRESSION: 1. Severe high-grade stenosis or possibly partially occlusive and/or recanalized thrombus within the proximal right M1 segment. Patent but attenuated flow distally within the right MCA distribution. Follow-up examination with dedicated CTA could be performed for further evaluation as clinically warranted. 2. Moderate left P2 stenosis. Otherwise wide patency of the major intracranial arterial vasculature. MRA NECK IMPRESSION: 1. Severely limited study due to the patient's inability to tolerate the full length of the exam, which was terminated early. 2. Gross patency of the major arterial vasculature of the neck. Examination is otherwise nondiagnostic in evaluation for stenosis or other potential acute vascular pathology. Electronically Signed   By: Rise Mu M.D.   On: 02/29/2020 02:03   MR ANGIO NECK WO CONTRAST  Result Date: 02/29/2020 CLINICAL DATA:  Follow-up examination for acute stroke. EXAM: MRI HEAD WITHOUT CONTRAST MRA HEAD WITHOUT CONTRAST MRA NECK WITHOUT CONTRAST TECHNIQUE: Multiplanar, multiecho pulse sequences of the brain and surrounding structures were obtained without intravenous contrast. Angiographic images of the Circle of Willis were obtained using MRA technique without intravenous contrast. Angiographic images of the neck were obtained using MRA technique without intravenous contrast. Carotid stenosis measurements (when applicable) are obtained utilizing NASCET criteria, using the distal internal carotid diameter as the denominator. COMPARISON:  Prior CT from 02/28/2020. FINDINGS: MRI HEAD FINDINGS Brain: Generalized age-related cerebral atrophy. Patchy T2/FLAIR hyperintensity within the periventricular and deep white matter both cerebral hemispheres most consistent with chronic small vessel ischemic disease, mild to  moderate in nature. Multiple scattered remote cerebellar infarcts noted, right greater than left. Associated scattered foci of chronic hemosiderin staining. Confluent diffusion abnormality involving the right basal ganglia consistent with an acute to subacute right MCA territory infarct. Associated susceptibility artifact compatible with petechial hemorrhage without frank hemorrhagic transformation. Additional patchy cortical and subcortical infarcts noted within the adjacent right frontal operculum. Findings correspond with abnormality on prior CT. Mild localized swelling at the right basal ganglia with partial effacement of the right lateral ventricle without significant midline shift or regional mass effect. No mass lesion. No hydrocephalus or extra-axial fluid collection. Pituitary gland and suprasellar region normal. Vascular: Major intracranial vascular flow voids are maintained. Skull and upper cervical spine: Advanced degenerative changes noted about the dens and C1-2 articulation with degenerative thickening at the tectorial membrane. Resultant mild spinal stenosis at the cervicomedullary junction without frank cord impingement. Bone marrow signal intensity within normal limits. No scalp soft tissue abnormality. Sinuses/Orbits: Globes and orbital soft tissues within normal limits. Paranasal sinuses are largely clear. No mastoid effusion. Inner ear structures grossly normal. Other: None. MRA HEAD FINDINGS ANTERIOR CIRCULATION: Visualized distal cervical segments of the internal carotid arteries are patent with antegrade flow. Petrous,  cavernous, and supraclinoid segments patent without stenosis or other abnormality. A1 segments patent bilaterally. Normal anterior communicating artery complex. Anterior cerebral arteries patent to their distal aspects without stenosis. Left M1 widely patent. Normal left MCA bifurcation. Distal left MCA branches well perfused. On the right, there is focal attenuation of the  proximal right M1 segment, measuring 5 mm in length (series 1040, image 10). Finding could reflect a severe high-grade stenosis or possibly partially occlusive and/or recanalized thrombus. No definite corresponding FLAIR signal abnormality seen at this level on corresponding brain MRI. Right M1 and distal MCA branches are patent distally but are markedly attenuated. No visible proximal M2 occlusion. POSTERIOR CIRCULATION: Vertebral arteries patent to the vertebrobasilar junction without stenosis. Right PICA patent. Left PICA not definitely seen. Basilar patent to its distal aspect without stenosis. Superior cerebral arteries patent bilaterally. Left PCA primarily supplied via the basilar. Right PCA supplied via a hypoplastic right P1 segment and robust right posterior communicating artery. Right PCA widely patent to its distal aspect. Moderate stenosis involving the mid left P2 segment noted (series 1034, image 16). Left PCA otherwise patent distally. No aneurysm. MRA NECK FINDINGS Examination severely limited as the patient was unable to tolerate the full length of the exam, and terminated the study early. Partially visualized aortic arch grossly within normal limits for caliber. No obvious stenosis about the origin of the great vessels. Both carotid artery systems appear grossly patent within the neck. Vertebral arteries appear grossly patent as well. Evaluation for possible flow-limiting stenosis or other vascular pathology severely limited. IMPRESSION: MRI HEAD IMPRESSION: 1. 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 Associated petechial hemorrhage at the right basal ganglia without frank hemorrhagic transformation or significant regional mass effect. 2. Underlying age-related cerebral atrophy with chronic small vessel ischemic disease, with multiple scattered remote cerebellar infarcts, right greater than left. MRA HEAD IMPRESSION:  1. Severe high-grade stenosis or possibly partially occlusive and/or recanalized thrombus within the proximal right M1 segment. Patent but attenuated flow distally within the right MCA distribution. Follow-up examination with dedicated CTA could be performed for further evaluation as clinically warranted. 2. Moderate left P2 stenosis. Otherwise wide patency of the major intracranial arterial vasculature. MRA NECK IMPRESSION: 1. Severely limited study due to the patient's inability to tolerate the full length of the exam, which was terminated early. 2. Gross patency of the major arterial vasculature of the neck. Examination is otherwise nondiagnostic in evaluation for stenosis or other potential acute vascular pathology. Electronically Signed   By: Rise Mu M.D.   On: 02/29/2020 02:03   MR BRAIN WO CONTRAST  Result Date: 02/29/2020 CLINICAL DATA:  Follow-up examination for acute stroke. EXAM: MRI HEAD WITHOUT CONTRAST MRA HEAD WITHOUT CONTRAST MRA NECK WITHOUT CONTRAST TECHNIQUE: Multiplanar, multiecho pulse sequences of the brain and surrounding structures were obtained without intravenous contrast. Angiographic images of the Circle of Willis were obtained using MRA technique without intravenous contrast. Angiographic images of the neck were obtained using MRA technique without intravenous contrast. Carotid stenosis measurements (when applicable) are obtained utilizing NASCET criteria, using the distal internal carotid diameter as the denominator. COMPARISON:  Prior CT from 02/28/2020. FINDINGS: MRI HEAD FINDINGS Brain: Generalized age-related cerebral atrophy. Patchy T2/FLAIR hyperintensity within the periventricular and deep white matter both cerebral hemispheres most consistent with chronic small vessel ischemic disease, mild to moderate in nature. Multiple scattered remote cerebellar infarcts noted, right greater than left. Associated scattered foci of chronic hemosiderin  staining. Confluent  diffusion abnormality involving the right basal ganglia consistent with an acute to subacute right MCA territory infarct. Associated susceptibility artifact compatible with petechial hemorrhage without frank hemorrhagic transformation. Additional patchy cortical and subcortical infarcts noted within the adjacent right frontal operculum. Findings correspond with abnormality on prior CT. Mild localized swelling at the right basal ganglia with partial effacement of the right lateral ventricle without significant midline shift or regional mass effect. No mass lesion. No hydrocephalus or extra-axial fluid collection. Pituitary gland and suprasellar region normal. Vascular: Major intracranial vascular flow voids are maintained. Skull and upper cervical spine: Advanced degenerative changes noted about the dens and C1-2 articulation with degenerative thickening at the tectorial membrane. Resultant mild spinal stenosis at the cervicomedullary junction without frank cord impingement. Bone marrow signal intensity within normal limits. No scalp soft tissue abnormality. Sinuses/Orbits: Globes and orbital soft tissues within normal limits. Paranasal sinuses are largely clear. No mastoid effusion. Inner ear structures grossly normal. Other: None. MRA HEAD FINDINGS ANTERIOR CIRCULATION: Visualized distal cervical segments of the internal carotid arteries are patent with antegrade flow. Petrous, cavernous, and supraclinoid segments patent without stenosis or other abnormality. A1 segments patent bilaterally. Normal anterior communicating artery complex. Anterior cerebral arteries patent to their distal aspects without stenosis. Left M1 widely patent. Normal left MCA bifurcation. Distal left MCA branches well perfused. On the right, there is focal attenuation of the proximal right M1 segment, measuring 5 mm in length (series 1040, image 10). Finding could reflect a severe high-grade stenosis or possibly partially occlusive and/or  recanalized thrombus. No definite corresponding FLAIR signal abnormality seen at this level on corresponding brain MRI. Right M1 and distal MCA branches are patent distally but are markedly attenuated. No visible proximal M2 occlusion. POSTERIOR CIRCULATION: Vertebral arteries patent to the vertebrobasilar junction without stenosis. Right PICA patent. Left PICA not definitely seen. Basilar patent to its distal aspect without stenosis. Superior cerebral arteries patent bilaterally. Left PCA primarily supplied via the basilar. Right PCA supplied via a hypoplastic right P1 segment and robust right posterior communicating artery. Right PCA widely patent to its distal aspect. Moderate stenosis involving the mid left P2 segment noted (series 1034, image 16). Left PCA otherwise patent distally. No aneurysm. MRA NECK FINDINGS Examination severely limited as the patient was unable to tolerate the full length of the exam, and terminated the study early. Partially visualized aortic arch grossly within normal limits for caliber. No obvious stenosis about the origin of the great vessels. Both carotid artery systems appear grossly patent within the neck. Vertebral arteries appear grossly patent as well. Evaluation for possible flow-limiting stenosis or other vascular pathology severely limited. IMPRESSION: MRI HEAD IMPRESSION: 1. 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 Associated petechial hemorrhage at the right basal ganglia without frank hemorrhagic transformation or significant regional mass effect. 2. Underlying age-related cerebral atrophy with chronic small vessel ischemic disease, with multiple scattered remote cerebellar infarcts, right greater than left. MRA HEAD IMPRESSION: 1. Severe high-grade stenosis or possibly partially occlusive and/or recanalized thrombus within the proximal right M1 segment. Patent but attenuated flow distally  within the right MCA distribution. Follow-up examination with dedicated CTA could be performed for further evaluation as clinically warranted. 2. Moderate left P2 stenosis. Otherwise wide patency of the major intracranial arterial vasculature. MRA NECK IMPRESSION: 1. Severely limited study due to the patient's inability to tolerate the full length of the exam, which was terminated early. 2. Michaell Cowing  patency of the major arterial vasculature of the neck. Examination is otherwise nondiagnostic in evaluation for stenosis or other potential acute vascular pathology. Electronically Signed   By: Rise Mu M.D.   On: 02/29/2020 02:03   DG Shoulder Left  Result Date: 02/28/2020 CLINICAL DATA:  Fall.  Abrasion to left shoulder. EXAM: LEFT SHOULDER - 2+ VIEW COMPARISON:  None FINDINGS: There is normal bony alignment. No evidence of acute osseous or articular abnormality. Degenerative changes of the acromioclavicular joint. IMPRESSION: No evidence of acute osseous or articular abnormality. Electronically Signed   By: Jackey Loge DO   On: 02/28/2020 13:06   DG Knee Complete 4 Views Left  Result Date: 02/28/2020 CLINICAL DATA:  Fall. EXAM: LEFT KNEE - COMPLETE 4+ VIEW COMPARISON:  No pertinent prior exams are available for comparison. FINDINGS: There is normal bony alignment. No evidence of acute osseous or articular abnormality. Tricompartmental osteoarthritic changes. Atherosclerotic vascular calcifications. IMPRESSION: No evidence of acute osseous or articular abnormality. Electronically Signed   By: Jackey Loge DO   On: 02/28/2020 13:08   DG Knee Complete 4 Views Right  Result Date: 02/28/2020 CLINICAL DATA:  Fall EXAM: RIGHT KNEE - COMPLETE 4+ VIEW COMPARISON:  None. FINDINGS: Alignment is anatomic. There is no acute fracture. Small joint effusion. Tricompartmental changes of osteoarthritis. Vascular calcifications. IMPRESSION: No acute fracture. Small joint effusion. Tricompartmental osteoarthritis.  Electronically Signed   By: Guadlupe Spanish M.D.   On: 02/28/2020 13:08   DG Abd 2 Views  Result Date: 02/28/2020 CLINICAL DATA:  Recent fall with abdominal pain, initial encounter EXAM: ABDOMEN - 2 VIEW COMPARISON:  None. FINDINGS: Scattered large and small bowel gas is noted. No acute bony abnormality is seen. No free air is noted. No mass lesion is noted. IMPRESSION: No acute abnormality noted. Electronically Signed   By: Alcide Clever M.D.   On: 02/28/2020 21:12   CT HEAD CODE STROKE WO CONTRAST  Result Date: 02/28/2020 CLINICAL DATA:  79 year old male found down, last seen well 02/25/2020. left face abrasions. Code stroke activated initially, but now canceled. EXAM: CT HEAD WITHOUT CONTRAST TECHNIQUE: Contiguous axial images were obtained from the base of the skull through the vertex without intravenous contrast. COMPARISON:  [facility] hospital brain MRI 06/08/2019, head CT 06/08/2019. FINDINGS: Brain: Confluent new hypodensity throughout the right basal ganglia which are swollen with mass effect on the left lateral ventricle. No midline shift. Nearby patchy opacity compatible with cytotoxic edema involving the superior right operculum (series 3, image 21). Underlying bilateral basal ganglia dystrophic calcifications. No acute intracranial hemorrhage identified. No ventriculomegaly. Basilar cisterns remain patent. Stable chronic cerebellar infarcts. No other acute cortically based infarct identified. Vascular: Calcified atherosclerosis at the skull base. No right side hyperdense vessel identified. However there is conspicuous hyperdensity of a left MCA branch in the sylvian fissure on coronal image 32, however, this is probably stable since last month (series 8, image 47 of the prior CT). Skull: No skull fracture or No acute osseous abnormality identified. Sinuses/Orbits: Visualized paranasal sinuses and mastoids are stable and well pneumatized. Other: Broad-based left superior convexity scalp hematoma.  There is left forehead and periorbital soft tissue hematoma also. No soft tissue gas. Leftward gaze, otherwise negative orbits. ASPECTS [facility] Stroke Program Early CT Score) Total score (0-10 with 10 being normal): 7 (right side caudate, lentiform, M5 abnormal). IMPRESSION: 1. 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 Mild regional mass effect. No hemorrhagic transformation or midline shift. 2. Otherwise stable  noncontrast CT appearance of the brain from last month. 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 3. Left face and scalp soft tissue hematoma. No underlying fracture identified. Electronically Signed   By: Odessa Fleming M.D.   On: 02/28/2020 11:46   CT Maxillofacial Wo Contrast  Result Date: 02/28/2020 CLINICAL DATA:  Facial trauma, weakness. EXAM: CT MAXILLOFACIAL WITHOUT CONTRAST TECHNIQUE: Multidetector CT imaging of the maxillofacial structures was performed. Multiplanar CT image reconstructions were also generated. COMPARISON:  02/28/2020 CT head. FINDINGS: Osseous: No fracture or mandibular dislocation. Left TMJ osteoarthrosis. Orbits: Globes are intact. Normal appearance of the extraocular muscles and optic nerve. No significant intraorbital fat stranding. Prominence of the left periorbital soft tissues. Sinuses: Clear paranasal sinuses.  No mastoid effusion. Soft tissues: Mild left scalp and facial soft tissue swelling. No walled-off fluid collection. Irregularity of the left perimandibular soft tissues may reflect overlying laceration. Limited intracranial: Please see prior same day head CT. IMPRESSION: No acute osseous abnormality.  Left TMJ osteoarthrosis. Mild left scalp and face soft tissue swelling. No walled-off fluid collection. Left perimandibular soft tissue laceration. Electronically Signed   By: Stana Bunting M.D.   On: 02/28/2020 14:39    Assessment/Plan: Diagnosis: R MCA infarct 1. Does the need  for close, 24 hr/day medical supervision in concert with the patient's rehab needs make it unreasonable for this patient to be served in a less intensive setting? Yes 2. Co-Morbidities requiring supervision/potential complications: malnourishment (BMI 18.13), MRSA, tobacco abuse, essential HTN, coronary atherosclerosis, type 2 DM, NSTEMI, CAD 3. Due to bladder management, bowel management, safety, skin/wound care, disease management, medication administration, pain management and patient education, does the patient require 24 hr/day rehab nursing? Yes 4. Does the patient require coordinated care of a physician, rehab nurse, therapy disciplines of PT, OT to address physical and functional deficits in the context of the above medical diagnosis(es)? Yes Addressing deficits in the following areas: balance, endurance, locomotion, strength, transferring, bowel/bladder control, bathing, dressing, feeding, grooming, toileting, cognition and psychosocial support, swallow 5. Can the patient actively participate in an intensive therapy program of at least 3 hrs of therapy per day at least 5 days per week? Yes 6. The potential for patient to make measurable gains while on inpatient rehab is excellent 7. Anticipated functional outcomes upon discharge from inpatient rehab are modified independent  with PT, modified independent with OT, modified independent with SLP. 8. Estimated rehab length of stay to reach the above functional goals is: 12-16 days 9. Anticipated discharge destination: Home 10. Overall Rehab/Functional Prognosis: excellent  RECOMMENDATIONS: This patient's condition is appropriate for continued rehabilitative care in the following setting: CIR Patient has agreed to participate in recommended program. Yes Note that insurance prior authorization may be required for reimbursement for recommended care.  Comment: Thank you for this consult. Admission coordinator to follow.   I have personally  performed a face to face diagnostic evaluation, including, but not limited to relevant history and physical exam findings, of this patient and developed relevant assessment and plan.  Additionally, I have reviewed and concur with the physician assistant's documentation above.  Sula Soda, MD   Mcarthur Rossetti Angiulli, PA-C 02/29/2020

## 2020-02-29 NOTE — Progress Notes (Signed)
Inpatient Rehab Admissions Coordinator Note:   Per PT recommendations, pt was screened for CIR candidacy by Estill Dooms, PT, DPT.  At this time we are recommending CIR consult and I will place an order per our protocol.  Please contact me with questions.   Estill Dooms, PT, DPT [phone] 02/29/20 9:34 AM

## 2020-02-29 NOTE — Progress Notes (Signed)
STROKE TEAM PROGRESS NOTE   INTERVAL HISTORY His RN is at the bedside.  Pt lying in bed, asking for water. He is not good historian and can not tell me whether he takes anticoagulation PTA. He still has left UE weakness and left facial droop. 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 TTE pending.   Vitals:   02/28/20 1720 02/28/20 1922 02/29/20 0101 02/29/20 0524  BP: (!) 163/71 127/84 (!) 182/92 (!) 166/78  Pulse: 60 66 88 62  Resp: 18 19 18    Temp: 97.9 F (36.6 C) 98 F (36.7 C) 98.6 F (37 C) 98.4 F (36.9 C)  TempSrc: Axillary Oral  Axillary  SpO2: 100% 100% 100%   Weight:      Height:       CBC:  Recent Labs  Lab 02/28/20 1123 02/28/20 1123 02/28/20 1409 02/29/20 0206  WBC 8.6  --   --  8.4  NEUTROABS 6.4  --   --   --   HGB 12.1*   < > 11.6* 12.3*  HCT 38.1*   < > 34.0* 37.5*  MCV 100.8*  --   --  98.7  PLT 219  --   --  204   < > = values in this interval not displayed.   Basic Metabolic Panel:  Recent Labs  Lab 02/28/20 1123 02/28/20 1123 02/28/20 1409 02/29/20 0206  NA 149*   < > 147* 149*  K 3.9   < > 3.9 3.7  CL 112*   < > 116* 113*  CO2 21*  --   --  22  GLUCOSE 175*   < > 165* 89  BUN 39*   < > 41* 32*  CREATININE 1.55*   < > 1.40* 1.35*  CALCIUM 9.7  --   --  9.6   < > = values in this interval not displayed.   Lipid Panel:  Recent Labs  Lab 02/29/20 0206  CHOL 190  TRIG 78  HDL 57  CHOLHDL 3.3  VLDL 16  LDLCALC 13/05/21*   HgbA1c:  Recent Labs  Lab 02/28/20 1704  HGBA1C 6.8*   Urine Drug Screen:  Recent Labs  Lab 02/28/20 1211  LABOPIA NONE DETECTED  COCAINSCRNUR NONE DETECTED  LABBENZ NONE DETECTED  AMPHETMU NONE DETECTED  THCU NONE DETECTED  LABBARB NONE DETECTED    Alcohol Level  Recent Labs  Lab 02/28/20 1123  ETH <10    IMAGING past 24 hours DG Chest 2 View  Result Date: 02/28/2020 CLINICAL DATA:  CVA EXAM: CHEST - 2 VIEW COMPARISON:  02/05/2020 FINDINGS: Prior median sternotomy. Heart and mediastinal  contours are within normal limits. No focal opacities or effusions. No acute bony abnormality. IMPRESSION: No active cardiopulmonary disease. Electronically Signed   By: 04/06/2020 M.D.   On: 02/28/2020 19:06   CT Cervical Spine Wo Contrast  Result Date: 02/28/2020 CLINICAL DATA:  Neck trauma, weakness. EXAM: CT CERVICAL SPINE WITHOUT CONTRAST TECHNIQUE: Multidetector CT imaging of the cervical spine was performed without intravenous contrast. Multiplanar CT image reconstructions were also generated. COMPARISON:  Same day head and maxillofacial CTs. FINDINGS: Alignment: Straightening of lordosis with slight reversal. Grade 1 C2-3 and C3-4 anterolisthesis. Minimal grade 1 C5-6 retrolisthesis. Skull base and vertebrae: Vertebral body heights are preserved. Multilevel degenerative changes including endplate sclerosis, osteophytosis and Schmorl's node formation. Fusion of the bilateral C2-3 facet joints. Soft tissues and spinal canal: No prevertebral fluid or swelling. No visible canal hematoma. Disc levels: Patent bony  spinal canal. Multilevel mild bony neural foraminal narrowing. Upper chest: Left apical scarring. Other: None. IMPRESSION: No acute fracture or traumatic listhesis. Multilevel spondylosis. Electronically Signed   By: Stana Bunting M.D.   On: 02/28/2020 14:47   MR ANGIO HEAD WO CONTRAST  Result Date: 02/29/2020 CLINICAL DATA:  Follow-up examination for acute stroke. EXAM: MRI HEAD WITHOUT CONTRAST MRA HEAD WITHOUT CONTRAST MRA NECK WITHOUT CONTRAST TECHNIQUE: Multiplanar, multiecho pulse sequences of the brain and surrounding structures were obtained without intravenous contrast. Angiographic images of the Circle of Willis were obtained using MRA technique without intravenous contrast. Angiographic images of the neck were obtained using MRA technique without intravenous contrast. Carotid stenosis measurements (when applicable) are obtained utilizing NASCET criteria, using the distal  internal carotid diameter as the denominator. COMPARISON:  Prior CT from 02/28/2020. FINDINGS: MRI HEAD FINDINGS Brain: Generalized age-related cerebral atrophy. Patchy T2/FLAIR hyperintensity within the periventricular and deep white matter both cerebral hemispheres most consistent with chronic small vessel ischemic disease, mild to moderate in nature. Multiple scattered remote cerebellar infarcts noted, right greater than left. Associated scattered foci of chronic hemosiderin staining. Confluent diffusion abnormality involving the right basal ganglia consistent with an acute to subacute right MCA territory infarct. Associated susceptibility artifact compatible with petechial hemorrhage without frank hemorrhagic transformation. Additional patchy cortical and subcortical infarcts noted within the adjacent right frontal operculum. Findings correspond with abnormality on prior CT. Mild localized swelling at the right basal ganglia with partial effacement of the right lateral ventricle without significant midline shift or regional mass effect. No mass lesion. No hydrocephalus or extra-axial fluid collection. Pituitary gland and suprasellar region normal. Vascular: Major intracranial vascular flow voids are maintained. Skull and upper cervical spine: Advanced degenerative changes noted about the dens and C1-2 articulation with degenerative thickening at the tectorial membrane. Resultant mild spinal stenosis at the cervicomedullary junction without frank cord impingement. Bone marrow signal intensity within normal limits. No scalp soft tissue abnormality. Sinuses/Orbits: Globes and orbital soft tissues within normal limits. Paranasal sinuses are largely clear. No mastoid effusion. Inner ear structures grossly normal. Other: None. MRA HEAD FINDINGS ANTERIOR CIRCULATION: Visualized distal cervical segments of the internal carotid arteries are patent with antegrade flow. Petrous, cavernous, and supraclinoid segments patent  without stenosis or other abnormality. A1 segments patent bilaterally. Normal anterior communicating artery complex. Anterior cerebral arteries patent to their distal aspects without stenosis. Left M1 widely patent. Normal left MCA bifurcation. Distal left MCA branches well perfused. On the right, there is focal attenuation of the proximal right M1 segment, measuring 5 mm in length (series 1040, image 10). Finding could reflect a severe high-grade stenosis or possibly partially occlusive and/or recanalized thrombus. No definite corresponding FLAIR signal abnormality seen at this level on corresponding brain MRI. Right M1 and distal MCA branches are patent distally but are markedly attenuated. No visible proximal M2 occlusion. POSTERIOR CIRCULATION: Vertebral arteries patent to the vertebrobasilar junction without stenosis. Right PICA patent. Left PICA not definitely seen. Basilar patent to its distal aspect without stenosis. Superior cerebral arteries patent bilaterally. Left PCA primarily supplied via the basilar. Right PCA supplied via a hypoplastic right P1 segment and robust right posterior communicating artery. Right PCA widely patent to its distal aspect. Moderate stenosis involving the mid left P2 segment noted (series 1034, image 16). Left PCA otherwise patent distally. No aneurysm. MRA NECK FINDINGS Examination severely limited as the patient was unable to tolerate the full length of the exam, and terminated the study early. Partially visualized aortic arch  grossly within normal limits for caliber. No obvious stenosis about the origin of the great vessels. Both carotid artery systems appear grossly patent within the neck. Vertebral arteries appear grossly patent as well. Evaluation for possible flow-limiting stenosis or other vascular pathology severely limited. IMPRESSION: MRI HEAD IMPRESSION: 1. 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 Associated petechial hemorrhage at the right basal ganglia without frank hemorrhagic transformation or significant regional mass effect. 2. Underlying age-related cerebral atrophy with chronic small vessel ischemic disease, with multiple scattered remote cerebellar infarcts, right greater than left. MRA HEAD IMPRESSION: 1. Severe high-grade stenosis or possibly partially occlusive and/or recanalized thrombus within the proximal right M1 segment. Patent but attenuated flow distally within the right MCA distribution. Follow-up examination with dedicated CTA could be performed for further evaluation as clinically warranted. 2. Moderate left P2 stenosis. Otherwise wide patency of the major intracranial arterial vasculature. MRA NECK IMPRESSION: 1. Severely limited study due to the patient's inability to tolerate the full length of the exam, which was terminated early. 2. Gross patency of the major arterial vasculature of the neck. Examination is otherwise nondiagnostic in evaluation for stenosis or other potential acute vascular pathology. Electronically Signed   By: Rise Mu M.D.   On: 02/29/2020 02:03   MR ANGIO NECK WO CONTRAST  Result Date: 02/29/2020 CLINICAL DATA:  Follow-up examination for acute stroke. EXAM: MRI HEAD WITHOUT CONTRAST MRA HEAD WITHOUT CONTRAST MRA NECK WITHOUT CONTRAST TECHNIQUE: Multiplanar, multiecho pulse sequences of the brain and surrounding structures were obtained without intravenous contrast. Angiographic images of the Circle of Willis were obtained using MRA technique without intravenous contrast. Angiographic images of the neck were obtained using MRA technique without intravenous contrast. Carotid stenosis measurements (when applicable) are obtained utilizing NASCET criteria, using the distal internal carotid diameter as the denominator. COMPARISON:  Prior CT from 02/28/2020. FINDINGS: MRI HEAD FINDINGS Brain: Generalized age-related cerebral atrophy.  Patchy T2/FLAIR hyperintensity within the periventricular and deep white matter both cerebral hemispheres most consistent with chronic small vessel ischemic disease, mild to moderate in nature. Multiple scattered remote cerebellar infarcts noted, right greater than left. Associated scattered foci of chronic hemosiderin staining. Confluent diffusion abnormality involving the right basal ganglia consistent with an acute to subacute right MCA territory infarct. Associated susceptibility artifact compatible with petechial hemorrhage without frank hemorrhagic transformation. Additional patchy cortical and subcortical infarcts noted within the adjacent right frontal operculum. Findings correspond with abnormality on prior CT. Mild localized swelling at the right basal ganglia with partial effacement of the right lateral ventricle without significant midline shift or regional mass effect. No mass lesion. No hydrocephalus or extra-axial fluid collection. Pituitary gland and suprasellar region normal. Vascular: Major intracranial vascular flow voids are maintained. Skull and upper cervical spine: Advanced degenerative changes noted about the dens and C1-2 articulation with degenerative thickening at the tectorial membrane. Resultant mild spinal stenosis at the cervicomedullary junction without frank cord impingement. Bone marrow signal intensity within normal limits. No scalp soft tissue abnormality. Sinuses/Orbits: Globes and orbital soft tissues within normal limits. Paranasal sinuses are largely clear. No mastoid effusion. Inner ear structures grossly normal. Other: None. MRA HEAD FINDINGS ANTERIOR CIRCULATION: Visualized distal cervical segments of the internal carotid arteries are patent with antegrade flow. Petrous, cavernous, and supraclinoid segments patent without stenosis or other abnormality. A1 segments patent bilaterally. Normal anterior communicating artery complex. Anterior cerebral arteries patent to their  distal aspects without stenosis. Left M1 widely  patent. Normal left MCA bifurcation. Distal left MCA branches well perfused. On the right, there is focal attenuation of the proximal right M1 segment, measuring 5 mm in length (series 1040, image 10). Finding could reflect a severe high-grade stenosis or possibly partially occlusive and/or recanalized thrombus. No definite corresponding FLAIR signal abnormality seen at this level on corresponding brain MRI. Right M1 and distal MCA branches are patent distally but are markedly attenuated. No visible proximal M2 occlusion. POSTERIOR CIRCULATION: Vertebral arteries patent to the vertebrobasilar junction without stenosis. Right PICA patent. Left PICA not definitely seen. Basilar patent to its distal aspect without stenosis. Superior cerebral arteries patent bilaterally. Left PCA primarily supplied via the basilar. Right PCA supplied via a hypoplastic right P1 segment and robust right posterior communicating artery. Right PCA widely patent to its distal aspect. Moderate stenosis involving the mid left P2 segment noted (series 1034, image 16). Left PCA otherwise patent distally. No aneurysm. MRA NECK FINDINGS Examination severely limited as the patient was unable to tolerate the full length of the exam, and terminated the study early. Partially visualized aortic arch grossly within normal limits for caliber. No obvious stenosis about the origin of the great vessels. Both carotid artery systems appear grossly patent within the neck. Vertebral arteries appear grossly patent as well. Evaluation for possible flow-limiting stenosis or other vascular pathology severely limited. IMPRESSION: MRI HEAD IMPRESSION: 1. 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 Associated petechial hemorrhage at the right basal ganglia without frank hemorrhagic transformation or significant regional mass effect. 2.  Underlying age-related cerebral atrophy with chronic small vessel ischemic disease, with multiple scattered remote cerebellar infarcts, right greater than left. MRA HEAD IMPRESSION: 1. Severe high-grade stenosis or possibly partially occlusive and/or recanalized thrombus within the proximal right M1 segment. Patent but attenuated flow distally within the right MCA distribution. Follow-up examination with dedicated CTA could be performed for further evaluation as clinically warranted. 2. Moderate left P2 stenosis. Otherwise wide patency of the major intracranial arterial vasculature. MRA NECK IMPRESSION: 1. Severely limited study due to the patient's inability to tolerate the full length of the exam, which was terminated early. 2. Gross patency of the major arterial vasculature of the neck. Examination is otherwise nondiagnostic in evaluation for stenosis or other potential acute vascular pathology. Electronically Signed   By: Rise Mu M.D.   On: 02/29/2020 02:03   MR BRAIN WO CONTRAST  Result Date: 02/29/2020 CLINICAL DATA:  Follow-up examination for acute stroke. EXAM: MRI HEAD WITHOUT CONTRAST MRA HEAD WITHOUT CONTRAST MRA NECK WITHOUT CONTRAST TECHNIQUE: Multiplanar, multiecho pulse sequences of the brain and surrounding structures were obtained without intravenous contrast. Angiographic images of the Circle of Willis were obtained using MRA technique without intravenous contrast. Angiographic images of the neck were obtained using MRA technique without intravenous contrast. Carotid stenosis measurements (when applicable) are obtained utilizing NASCET criteria, using the distal internal carotid diameter as the denominator. COMPARISON:  Prior CT from 02/28/2020. FINDINGS: MRI HEAD FINDINGS Brain: Generalized age-related cerebral atrophy. Patchy T2/FLAIR hyperintensity within the periventricular and deep white matter both cerebral hemispheres most consistent with chronic small vessel ischemic  disease, mild to moderate in nature. Multiple scattered remote cerebellar infarcts noted, right greater than left. Associated scattered foci of chronic hemosiderin staining. Confluent diffusion abnormality involving the right basal ganglia consistent with an acute to subacute right MCA territory infarct. Associated susceptibility artifact compatible with petechial hemorrhage without frank hemorrhagic transformation. Additional patchy  cortical and subcortical infarcts noted within the adjacent right frontal operculum. Findings correspond with abnormality on prior CT. Mild localized swelling at the right basal ganglia with partial effacement of the right lateral ventricle without significant midline shift or regional mass effect. No mass lesion. No hydrocephalus or extra-axial fluid collection. Pituitary gland and suprasellar region normal. Vascular: Major intracranial vascular flow voids are maintained. Skull and upper cervical spine: Advanced degenerative changes noted about the dens and C1-2 articulation with degenerative thickening at the tectorial membrane. Resultant mild spinal stenosis at the cervicomedullary junction without frank cord impingement. Bone marrow signal intensity within normal limits. No scalp soft tissue abnormality. Sinuses/Orbits: Globes and orbital soft tissues within normal limits. Paranasal sinuses are largely clear. No mastoid effusion. Inner ear structures grossly normal. Other: None. MRA HEAD FINDINGS ANTERIOR CIRCULATION: Visualized distal cervical segments of the internal carotid arteries are patent with antegrade flow. Petrous, cavernous, and supraclinoid segments patent without stenosis or other abnormality. A1 segments patent bilaterally. Normal anterior communicating artery complex. Anterior cerebral arteries patent to their distal aspects without stenosis. Left M1 widely patent. Normal left MCA bifurcation. Distal left MCA branches well perfused. On the right, there is focal  attenuation of the proximal right M1 segment, measuring 5 mm in length (series 1040, image 10). Finding could reflect a severe high-grade stenosis or possibly partially occlusive and/or recanalized thrombus. No definite corresponding FLAIR signal abnormality seen at this level on corresponding brain MRI. Right M1 and distal MCA branches are patent distally but are markedly attenuated. No visible proximal M2 occlusion. POSTERIOR CIRCULATION: Vertebral arteries patent to the vertebrobasilar junction without stenosis. Right PICA patent. Left PICA not definitely seen. Basilar patent to its distal aspect without stenosis. Superior cerebral arteries patent bilaterally. Left PCA primarily supplied via the basilar. Right PCA supplied via a hypoplastic right P1 segment and robust right posterior communicating artery. Right PCA widely patent to its distal aspect. Moderate stenosis involving the mid left P2 segment noted (series 1034, image 16). Left PCA otherwise patent distally. No aneurysm. MRA NECK FINDINGS Examination severely limited as the patient was unable to tolerate the full length of the exam, and terminated the study early. Partially visualized aortic arch grossly within normal limits for caliber. No obvious stenosis about the origin of the great vessels. Both carotid artery systems appear grossly patent within the neck. Vertebral arteries appear grossly patent as well. Evaluation for possible flow-limiting stenosis or other vascular pathology severely limited. IMPRESSION: MRI HEAD IMPRESSION: 1. 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 Associated petechial hemorrhage at the right basal ganglia without frank hemorrhagic transformation or significant regional mass effect. 2. Underlying age-related cerebral atrophy with chronic small vessel ischemic disease, with multiple scattered remote cerebellar infarcts, right greater than left.  MRA HEAD IMPRESSION: 1. Severe high-grade stenosis or possibly partially occlusive and/or recanalized thrombus within the proximal right M1 segment. Patent but attenuated flow distally within the right MCA distribution. Follow-up examination with dedicated CTA could be performed for further evaluation as clinically warranted. 2. Moderate left P2 stenosis. Otherwise wide patency of the major intracranial arterial vasculature. MRA NECK IMPRESSION: 1. Severely limited study due to the patient's inability to tolerate the full length of the exam, which was terminated early. 2. Gross patency of the major arterial vasculature of the neck. Examination is otherwise nondiagnostic in evaluation for stenosis or other potential acute vascular pathology. Electronically Signed   By: Janell Quiet.D.  On: 02/29/2020 02:03   DG Shoulder Left  Result Date: 02/28/2020 CLINICAL DATA:  Fall.  Abrasion to left shoulder. EXAM: LEFT SHOULDER - 2+ VIEW COMPARISON:  None FINDINGS: There is normal bony alignment. No evidence of acute osseous or articular abnormality. Degenerative changes of the acromioclavicular joint. IMPRESSION: No evidence of acute osseous or articular abnormality. Electronically Signed   By: Jackey Loge DO   On: 02/28/2020 13:06   DG Knee Complete 4 Views Left  Result Date: 02/28/2020 CLINICAL DATA:  Fall. EXAM: LEFT KNEE - COMPLETE 4+ VIEW COMPARISON:  No pertinent prior exams are available for comparison. FINDINGS: There is normal bony alignment. No evidence of acute osseous or articular abnormality. Tricompartmental osteoarthritic changes. Atherosclerotic vascular calcifications. IMPRESSION: No evidence of acute osseous or articular abnormality. Electronically Signed   By: Jackey Loge DO   On: 02/28/2020 13:08   DG Knee Complete 4 Views Right  Result Date: 02/28/2020 CLINICAL DATA:  Fall EXAM: RIGHT KNEE - COMPLETE 4+ VIEW COMPARISON:  None. FINDINGS: Alignment is anatomic. There is no acute  fracture. Small joint effusion. Tricompartmental changes of osteoarthritis. Vascular calcifications. IMPRESSION: No acute fracture. Small joint effusion. Tricompartmental osteoarthritis. Electronically Signed   By: Guadlupe Spanish M.D.   On: 02/28/2020 13:08   DG Abd 2 Views  Result Date: 02/28/2020 CLINICAL DATA:  Recent fall with abdominal pain, initial encounter EXAM: ABDOMEN - 2 VIEW COMPARISON:  None. FINDINGS: Scattered large and small bowel gas is noted. No acute bony abnormality is seen. No free air is noted. No mass lesion is noted. IMPRESSION: No acute abnormality noted. Electronically Signed   By: Alcide Clever M.D.   On: 02/28/2020 21:12   CT HEAD CODE STROKE WO CONTRAST  Result Date: 02/28/2020 CLINICAL DATA:  79 year old male found down, last seen well 02/25/2020. left face abrasions. Code stroke activated initially, but now canceled. EXAM: CT HEAD WITHOUT CONTRAST TECHNIQUE: Contiguous axial images were obtained from the base of the skull through the vertex without intravenous contrast. COMPARISON:  [facility] hospital brain MRI 06/08/2019, head CT 06/08/2019. FINDINGS: Brain: Confluent new hypodensity throughout the right basal ganglia which are swollen with mass effect on the left lateral ventricle. No midline shift. Nearby patchy opacity compatible with cytotoxic edema involving the superior right operculum (series 3, image 21). Underlying bilateral basal ganglia dystrophic calcifications. No acute intracranial hemorrhage identified. No ventriculomegaly. Basilar cisterns remain patent. Stable chronic cerebellar infarcts. No other acute cortically based infarct identified. Vascular: Calcified atherosclerosis at the skull base. No right side hyperdense vessel identified. However there is conspicuous hyperdensity of a left MCA branch in the sylvian fissure on coronal image 32, however, this is probably stable since last month (series 8, image 47 of the prior CT). Skull: No skull fracture or No  acute osseous abnormality identified. Sinuses/Orbits: Visualized paranasal sinuses and mastoids are stable and well pneumatized. Other: Broad-based left superior convexity scalp hematoma. There is left forehead and periorbital soft tissue hematoma also. No soft tissue gas. Leftward gaze, otherwise negative orbits. ASPECTS [facility] Stroke Program Early CT Score) Total score (0-10 with 10 being normal): 7 (right side caudate, lentiform, M5 abnormal). IMPRESSION: 1. 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 Mild regional mass effect. No hemorrhagic transformation or midline shift. 2. Otherwise stable noncontrast CT appearance of the brain from last month. 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 3. Left face and scalp soft tissue hematoma. No underlying fracture identified. Electronically Signed  By: Odessa Fleming M.D.   On: 02/28/2020 11:46   CT Maxillofacial Wo Contrast  Result Date: 02/28/2020 CLINICAL DATA:  Facial trauma, weakness. EXAM: CT MAXILLOFACIAL WITHOUT CONTRAST TECHNIQUE: Multidetector CT imaging of the maxillofacial structures was performed. Multiplanar CT image reconstructions were also generated. COMPARISON:  02/28/2020 CT head. FINDINGS: Osseous: No fracture or mandibular dislocation. Left TMJ osteoarthrosis. Orbits: Globes are intact. Normal appearance of the extraocular muscles and optic nerve. No significant intraorbital fat stranding. Prominence of the left periorbital soft tissues. Sinuses: Clear paranasal sinuses.  No mastoid effusion. Soft tissues: Mild left scalp and facial soft tissue swelling. No walled-off fluid collection. Irregularity of the left perimandibular soft tissues may reflect overlying laceration. Limited intracranial: Please see prior same day head CT. IMPRESSION: No acute osseous abnormality.  Left TMJ osteoarthrosis. Mild left scalp and face soft tissue swelling. No walled-off fluid  collection. Left perimandibular soft tissue laceration. Electronically Signed   By: Stana Bunting M.D.   On: 02/28/2020 14:39    PHYSICAL EXAM  Temp:  [97.9 F (36.6 C)-98.6 F (37 C)] 98.4 F (36.9 C) (11/05 0524) Pulse Rate:  [60-88] 62 (11/05 0524) Resp:  [11-19] 18 (11/05 0101) BP: (127-184)/(71-92) 166/78 (11/05 0524) SpO2:  [98 %-100 %] 100 % (11/05 0101)  General - Well nourished, well developed, in no apparent distress.  Ophthalmologic - fundi not visualized due to noncooperation.  Cardiovascular - Regular rhythm and rate, not in afib.  Mental Status -  Level of arousal and orientation to time, place, and person were intact. Language including expression, naming, repetition, comprehension was assessed and found intact. Mild dysarthria  Cranial Nerves II - XII - II - Visual field intact OU. III, IV, VI - Extraocular movements intact. V - Facial sensation intact bilaterally. VII - left facial droop. VIII - Hearing & vestibular intact bilaterally. X - Palate elevates symmetrically. XI - Chin turning & shoulder shrug intact bilaterally. XII - Tongue protrusion intact.  Motor Strength - The patient's strength was symmetrical BLEs with proximal 3/5 and drift within 5 secs, and 4/5 toe DF. RUE at least 4/5 and LUE 2+/5 proximal and 0/5 finger grip. Bulk was normal and fasciculations were absent.   Motor Tone - Muscle tone was assessed at the neck and appendages and was normal.  Reflexes - The patient's reflexes were symmetrical in all extremities and he had no pathological reflexes.  Sensory - Light touch, temperature/pinprick were assessed and were symmetrical.    Coordination - The patient had normal movements in the right hand with no ataxia or dysmetria.  Tremor was absent.  Gait and Station - deferred.   ASSESSMENT/PLAN [name] is a 79 y.o. 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 Chronic R cerebellar and prominent L MCA branch atherosclerosis.   MRI  R MCA infarct R basal ganglia and R frontal lobe w/ petechial hemorrhage in R basal ganglia. Old R>L cerebellar infarcts.   MRA head proximal R M1 w/ high-grade stenosis vs occlusion. L P2 moderate stenosis.   CTA head and neck - Intracranial atherosclerosis including a severe proximal right M1 stenosis with diffuse attenuation of the  right MCA more distally. Moderate to severe left P2 stenosis.  2D Echo EF 45-50%, can not rule out LV thrombus  TTE with contrast pending  LDL 117  HgbA1c 6.8  UDS neg  VTE prophylaxis - IV heparin  aspirin 81 mg daily, Brilinta (ticagrelor) 90 mg bid and Eliquis (apixaban) daily prior to admission, now on heparin IV.   Therapy recommendations:  CIR  Disposition:  pending   ? LV thrombus  02/05/20 [facility] note - "Repeat ECHO with EF: 35-50% and persistent apical thrombus, grossly unchanged from prior"  However, 06/2018 and 01/2011 TTE showed EF 40-45% and apical thinning and akinesis, but did not mention apical thrombus.  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 Lucianne Muss started digoxin, lisinopril, imdur, hydralazine  Also stated on eliquis and lipitor as well as ASA 81  Recommend ZIO patch but left [facility] w/o  Not compliant with meds at home  Hypertension  Stable, on the high side at times . Permissive hypertension (OK if < 180/105 given on heparin IV) but gradually normalize in 3-5 days . Long-term BP goal 130-150 given right MCA high grade stenosis vs. occlusion  Hyperlipidemia  Home meds:  lipitor 80, resumed in hospital  LDL 117,  goal < 70  Continue statin at discharge  Diabetes type II Controlled  HgbA1c 6.8, goal < 7.0  CBGs  SSI  PCP follow up  Dysphagia . Secondary to stroke . MBSS . Cleared for D1  . 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 After hours, contact General Neurology

## 2020-02-29 NOTE — Progress Notes (Signed)
Pt with very large urine incontinence at this time. Full linen and gown change and bath given. Pt does seem to be in better spirits and says "You look beautiful" and thanks staff.

## 2020-02-29 NOTE — Progress Notes (Signed)
ANTICOAGULATION CONSULT NOTE - Initial Consult  Pharmacy Consult for heparin Indication: stroke  No Known Allergies  Patient Measurements: Height: 5\' 11"  (180.3 cm) Weight: 59 kg (130 lb) IBW/kg (Calculated) : 75.3  Heparin dosing weight: 59kg  Vital Signs: Temp: 98.4 F (36.9 C) (11/05 0524) Temp Source: Axillary (11/05 0524) BP: 166/78 (11/05 0524) Pulse Rate: 62 (11/05 0524)  Labs: Recent Labs    02/28/20 1123 02/28/20 1123 02/28/20 1409 02/29/20 0206  HGB 12.1*   < > 11.6* 12.3*  HCT 38.1*  --  34.0* 37.5*  PLT 219  --   --  204  APTT 23*  --   --   --   LABPROT 13.6  --   --   --   INR 1.1  --   --   --   HEPARINUNFRC  --   --   --  0.11*  CREATININE 1.55*  --  1.40* 1.35*  CKTOTAL 745*  --   --   --    < > = values in this interval not displayed.    Estimated Creatinine Clearance: 38.2 mL/min (A) (by C-G formula based on SCr of 1.35 mg/dL (H)).  Assessment: CC/HPI: 79 yo m presenting with AMS and left sided weakness - found on floor after 3 days, not moving left side. Of note patient had an ECHO in October that showed persistent LV thrombus and was started on Eliquis, last dose taken 11/2 per med rec.   Pharmacy consulted to dose IV heparin for new subacute stroke.    PMH: ICM, HLD DM CAD  Neuro: 11/4 CTH - subacute right MCA infarct   Heme/Onc: H&H stable, PLT WNL  Initial HL = 0.11, drawn early (1hr after infusion started). F/u level ~8hr later = 0.14, subtherapeutic. Will increase rate cautiously to get to goal range.    Goal of Therapy:  Heparin level 0.3 - 0.5 units/ml Monitor platelets by anticoagulation protocol: Yes   Plan:  Increase heparin gtt rate to 850 units/hr F/u with 8-hr HL and daily CBC Monitor for s/sx bleeding F/u plans for oral [facility]  [facility], PharmD PGY1 Acute Care Pharmacy Resident Please refer to [facility] for unit-specific pharmacist

## 2020-02-29 NOTE — Progress Notes (Signed)
Paged Dr.Shaloub rt pt extremely agitated, as evidenced by pt using tele box as a projectile to swing at staff, kicking and rattling side rails, pressing buttons on the alaris pump and removing tubing from it, yelling, and spitting at staff. Pt very adamant about wanting coffee, however did have a bout of coughing after sipping it.  I have personally spent quite a bit of time this shift "sitting" the patient to try and calm him when he has not been away at various tests (MRI, Xray) and have been unsuccessful. Notified MD of all of the above. Dr orders haldol at this time.

## 2020-02-29 NOTE — Consult Note (Signed)
WOC Nurse Consult Note: Reason for Consult: Consult requested for abrasions.  Pt had a fall prior to admission. Wound type: Left face with full thickness abrasion; 6.5X4X.1cm, red and dry, no odor or drainage Left knee with full thickness abrasion; 3X3cm dry dark brown adhered scab, no odor or drainage Right knee  full thickness abrasion; 3X3cm dry dark brown adhered scab, no odor or drainage Dressing procedure/placement/frequency: Topical treatment orders provided for bedside nurse to perform as follows to promote moist healing: Apply Bactroban to bilat knees and face BID, then cover knees with foam dressings and change foam dressings Q 3 days or PRN.  Leave face wound open to air. Please re-consult if further assistance is needed.  Thank-you,  Cammie Mcgee MSN, RN, CWOCN, [place], CNS [phone]

## 2020-02-29 NOTE — Progress Notes (Signed)
Paged provider re: changing route of patient medications.

## 2020-02-29 NOTE — Progress Notes (Signed)
Patient off floor to CT.

## 2020-02-29 NOTE — Progress Notes (Signed)
Spoke with Dr. Pola Corn re: changing route of patient medications, new orders received.

## 2020-02-29 NOTE — Evaluation (Signed)
Clinical/Bedside Swallow Evaluation Patient Details  Name: [name] MRN: [id number] Date of Birth: [birth year]  Today's Date: 02/29/2020 Time: SLP Start Time (ACUTE ONLY): 1937 SLP Stop Time (ACUTE ONLY): 0910 SLP Time Calculation (min) (ACUTE ONLY): 35 min  Past Medical History:  Past Medical History:  Diagnosis Date  . Coronary artery disease    a. 2007 dLAD 100->med rx;  b. 2011 PCI: 80 LAD (Promus 2.75x18); c. 03/2015 MV: predom fixed inf/antapical defefcts w/ ischemia, depressed EF;  c. 04/2015 NSTEMI/PCI: LM nl,LAD 40p ISR, 100d (2.5x20 Promus DES), LCX 50m (3.0x20 Promus DES), RCA 30p, 7m, 70d.   . Diabetes mellitus without complication (HCC)   . Dyslipidemia   . Hx MRSA infection   . Ischemic cardiomyopathy    a. 04/2015 Echo: EF 25-30%, apical AK, Gr 2 DD, triv AI, mild MR, mildly dil LA, PASP .  . Tobacco abuse    Past Surgical History:  Past Surgical History:  Procedure Laterality Date  . CARDIAC CATHETERIZATION N/A 05/16/2015   Procedure: Left Heart Cath and Coronary Angiography;  Surgeon: Tonny Bollman, MD;  Location: [facility] INVASIVE CV LAB;  Service: Cardiovascular;  Laterality: N/A;  . CARDIAC CATHETERIZATION N/A 05/16/2015   Procedure: Coronary Stent Intervention;  Surgeon: Tonny Bollman, MD;  Location[facility] INVASIVE CV LAB;  Service: Cardiovascular;  Laterality: N/A;  . TOTAL SHOULDER REPLACEMENT     Right   HPI:  Pt is a 79 year old male who presented with L-sided weakness, L facial droop, and confusion after being on the ground at home and appears with abrasion on left side of his face. He did not receive tPA and NIH 14-16. 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 It also showed "Associated petechial hemorrhage at the right basal ganglia without frank hemorrhagic transformation or significant regional mass effect", per MRI report. Medical hx of: CAD, DM, tobaccos use and ischemic cardiomyopathy.  Pt  with agitation over night but per chart review, appeared with improved mentation/improving left sided strength.  Pt noted to overt cough with consumption of coffee overnight and was made NPO with swallow evaluation ordered.  Pt states he has a h/o a stroke with left sided weakness, chart review yielded findings of TIA.   Assessment / Plan / Recommendation Clinical Impression  Patient presents with multiple cranial nerve deficits including at least facial nerve, hypoglossal and suspected vagus nerve deficits.  Facial asymmetry with decreased labial closure observed along with decreased lingual ROM to left.  Vocal and cued cough strength decreased per pt, which may indicate vagus nerve deficit. Pt demonstrates decreased awareness to neurological changes - denying their presence- which impacts validity of cranial nerve exam.    Administration of single small ice chips, thin and nectar water via tsp were swallowed without indication of aspiration, however when cup bolus consumed - pt presented with overt cough immediately post-swallow.  Of note, initially pt was able to brush his own teeth with oral suction set - but did become disengaged *? decr attention vs sleepiness or both* during session.    Suspect component of oral control deficits and pharyngeal strength issues.  Pt will benefit from MBS today if pt fully alert to allow instrumental evaluation of swallow given acute CVA impacting basal ganglia.    Recommend pending MBS test, tsps of thin water be allowed only when fully alert.  Informed RN and pt of recommendations and posted swallow precaution sign. SLP Visit  Diagnosis: Dysphagia, unspecified (R13.10)    Aspiration Risk  Moderate aspiration risk;Risk for inadequate nutrition/hydration    Diet Recommendation NPO (tsps of thin water only pending mbs)        Other  Recommendations Oral Care Recommendations: Oral care QID   Follow up Recommendations Inpatient Rehab      Frequency and  Duration   TBD         Prognosis   TBD     Swallow Study   General Date of Onset: 02/29/20 HPI: Pt is a 79 year old male who presented with L-sided weakness, L facial droop, and confusion after being on the ground at home and appears with abrasion on left side of his face. He did not receive tPA and NIH 14-16. 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 It also showed "Associated petechial hemorrhage at the right basal ganglia without frank hemorrhagic transformation or significant regional mass effect", per MRI report. Medical hx of: CAD, DM, tobaccos use and ischemic cardiomyopathy.  Pt with agitation over night but per chart review, appeared with improved mentation/improving left sided strength.  Pt noted to overt cough with consumption of coffee overnight and was made NPO with swallow evaluation ordered.  Pt states he has a h/o a stroke with left sided weakness, chart review yielded findings of TIA. Type of Study: Bedside Swallow Evaluation Diet Prior to this Study: NPO Temperature Spikes Noted: No Respiratory Status: Room air History of Recent Intubation: No Behavior/Cognition: Alert;Cooperative (perseverating on "water") Oral Cavity Assessment: Dry Oral Care Completed by SLP: Other (Comment) (helped pt set up for oral care/dental brushing with oral suction) Self-Feeding Abilities: Needs assist Patient Positioning: Upright in bed Baseline Vocal Quality: Low vocal intensity Volitional Cough: Weak Volitional Swallow: Able to elicit    Oral/Motor/Sensory Function Overall Oral Motor/Sensory Function: Moderate impairment Facial ROM: Suspected CN VII (facial) dysfunction Facial Symmetry: Suspected CN VII (facial) dysfunction Facial Strength: Suspected CN VII (facial) dysfunction Lingual ROM: Suspected CN XII (hypoglossal) dysfunction Lingual Symmetry: Suspected CN XII (hypoglossal) dysfunction Lingual Strength: Suspected  CN XII (hypoglossal) dysfunction Velum:  (did not view due to pt not opening oral cavity wide enough) Mandible: Other (Comment) (dnt)   Ice Chips Ice chips: Within functional limits Presentation: Spoon   Thin Liquid Thin Liquid: Impaired Presentation: Spoon Oral Phase Impairments: Other (comment) Oral Phase Functional Implications: Oral holding Other Comments: clinically pt appeared with oral holding    Nectar Thick Nectar Thick Liquid: Impaired Presentation: Spoon;Cup Pharyngeal Phase Impairments: Suspected delayed Swallow;Cough - Immediate   Honey Thick Honey Thick Liquid: Not tested   Puree Puree: Not tested   Solid     Solid: Not tested      Chales Abrahams 02/29/2020,10:00 AM   Rolena Infante, MS [facility] SLP Acute Rehab Services Office [phone] Pager [phone]

## 2020-02-29 NOTE — Progress Notes (Signed)
  Echocardiogram 2D Echocardiogram has been performed.  [name] 02/29/2020, 1:20 PM

## 2020-02-29 NOTE — Progress Notes (Signed)
PROGRESS NOTE  [name]  DOB: [birth year]  PCP: Deatra Hakim, MD KTG:[id number]  DOA: 02/28/2020  LOS: 1 day   Chief Complaint  Patient presents with  . Weakness    Brief narrative: [name] is a 79 y.o. male who presented to the ED on 02/28/2020 left-sided weakness. 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 Patient lives alone, with neighbors stopping by everyone as well.   Patient had not been seen by neighbors for 3 days thus a well check was made by police.  Police found him on the floor with house disheveled, patient drowsy, confused on the floor with left facial droop and left sided weakness.  It was not clear how long he was on the floor for. EMS brought to the ED as code stroke.    In the ED, patient was drowsy, sodium level elevated to 147, creatinine 1.55, CK 745, WBC count 8.6 CT head, MRI brain showed acute to subacute ischemic right MCA territory infarct involving the right basal ganglia and overlying right frontal lobe associated with petechial hemorrhage at the right basal ganglia.  He also has has chronic right cerebellar infarcts  MRA head and neck showed severe high-grade stenosis within the proximal right M1 segment.    Patient was admitted to hospitalist service. Neurology consultation was obtained.  Subjective: Patient was seen and examined this morning.  Patient was drowsy, try to open eyes and sternal rub.  Per RN, he failed swallow eval earlier. Chart reviewed. Afebrile, heart rate in 40s to 60s, blood pressure elevated up to 180s Labs this morning with worsening sodium level to 149  Assessment/Plan: Acute to subacute CVA right MCA, with left-sided paresis and aphasia  Chronic right cerebellar infarcts. -Neurology consult appreciated. -Etiology of acute infarct: thrombotic in the setting of significant intracranial  atherosclerosis versus cardioembolic origin in the setting LV apical thrombus  -Imaging findings as above. -Pending echocardiogram. -Prior to admission, patient was on aspirin, Eliquis.  Currently on Heparin drip initiated per neurology recommendation.  Continue statin. -A1c 6.1.  Lipid panel with LDL 117. -PT/OT/ST eval.  Hypertensive urgency -Blood pressure was elevated up to 190s on presentation. -Likely from not being able to take his BP meds in the last few days. -It is unclear at this time if patient was taking any blood pressure medicine at home [name] on his list. -Currently on hydralazine as needed.  Permissive hypertension.  Persistent LV thrombus Chronic systolic CHF -Outside echo from last month showed EF of 35 to 50% and persistent LV thrombus. -Not sure if patient was compliant to Eliquis at home. -New echo ordered.  Currently on heparin drip.  Cardiac arrhythmia -Per records from [facility], patient has history of tachybradycardia syndrome/possible A fib/ A flutter -There is also concern of noncompliance to his medication.  Supposed to get digoxin and metoprolol at home. -Digoxin level on admission was subtherapeutic, noncompliance versus unable to take because of fall -Currently patient is not able to take oral intake and also is bradycardic.  Keep digoxin on hold. -Per outside records, while at [facility] in October 2021, he was planned for discharge with Zio patch but left prior to receiving his discharge paperwork or Zio patch.   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  IIDM -A1c 6.8 on 11/4  -continue sliding scale insulin.  Metformin on hold. Recent  Labs  Lab 02/28/20 1121 02/28/20 1725 02/28/20 2118  GLUCAP 161* 130* 91   CKD stage II -Baseline creatinine 1.2-1.6 -Left clinically dehydrated on admission, currently on half-normal saline at 100 mill per hour. Recent Labs    02/28/20 1123 02/28/20 1409 02/29/20 0206  BUN 39* 41* 32*    CREATININE 1.55* 1.40* 1.35*   Hypernatremia -Sodium level elevated 249.  Expect improvement with D5 half NS. Recent Labs  Lab 02/28/20 1123 02/28/20 1409 02/29/20 0206  NA 149* 147* 149*   Rhabdomyolysis -Mild elevation in CK likely because of prolonged downtime -Continue monitor with hydration.  Mobility: PT eval Code Status:   Code Status: Full Code  Nutritional status: Body mass index is 18.13 kg/m.     Diet Order            Diet NPO time specified  Diet effective now                 DVT prophylaxis: Heparin drip   Antimicrobials:  None Fluid: D5 half NS at 75 mL/h Consultants: Neurology Family Communication:  None at bedside  Status is: Inpatient  Remains inpatient appropriate because: Remains altered, continued stroke work-up  Dispo: The patient is from: Home              Anticipated d/c is to: SNF likely needs              Anticipated d/c date is: > 3 days              Patient currently is not medically stable to d/c.       Infusions:  . sodium chloride 100 mL/hr at 02/28/20 1947  . heparin 700 Units/hr (02/29/20 0047)    Scheduled Meds: . atorvastatin  80 mg Oral q1800  . hydrALAZINE  50 mg Oral Q6H  . insulin aspart  0-9 Units Subcutaneous TID WC  . isosorbide mononitrate  30 mg Oral Daily  . LORazepam  0.5 mg Intravenous Once    Antimicrobials: Anti-infectives (From admission, onward)   None      PRN meds: acetaminophen **OR** acetaminophen (TYLENOL) oral liquid 160 mg/5 mL **OR** acetaminophen, hydrALAZINE   Objective: Vitals:   02/29/20 0101 02/29/20 0524  BP: (!) 182/92 (!) 166/78  Pulse: 88 62  Resp: 18   Temp: 98.6 F (37 C) 98.4 F (36.9 C)  SpO2: 100%    No intake or output data in the 24 hours ending 02/29/20 0828 Filed Weights   02/28/20 1217  Weight: 59 kg   Weight change:  Body mass index is 18.13 kg/m.   Physical Exam: General exam: Appears calm and comfortable.  Not in physical distress Skin: No  rashes, lesions or ulcers. HEENT: Atraumatic, normocephalic, supple neck, no obvious bleeding Lungs: Clear to auscultation bilaterally CVS: Regular rate and rhythm, no murmur GI/Abd soft, nontender, nondistended, bowels are present CNS: Tries to open eyes on sternal rub. Psychiatry: Unable to examine because of mental status Extremities: No pedal edema, no calf tenderness  Data Review: I have personally reviewed the laboratory data and studies available.  Recent Labs  Lab 02/28/20 1123 02/28/20 1409 02/29/20 0206  WBC 8.6  --  8.4  NEUTROABS 6.4  --   --   HGB 12.1* 11.6* 12.3*  HCT 38.1* 34.0* 37.5*  MCV 100.8*  --  98.7  PLT 219  --  204   Recent Labs  Lab 02/28/20 1123 02/28/20 1409 02/29/20 0206  NA 149* 147* 149*  K  3.9 3.9 3.7  CL 112* 116* 113*  CO2 21*  --  22  GLUCOSE 175* 165* 89  BUN 39* 41* 32*  CREATININE 1.55* 1.40* 1.35*  CALCIUM 9.7  --  9.6    F/u labs ordered  Signed, Lorin Glass, MD [facility] 02/29/2020

## 2020-02-29 NOTE — Progress Notes (Signed)
Pt yelling, hitting siderails, kicking staff, attempting to get oob, and continues to be on heparin gtt. pt is hitting side rails so hard that he is damaging his skin and causing his IV to bleed. Tele sitter remains in place but does not seem to be helpful, as I have had to personally remain in the room much of the time. Did express my concerns and the information above to charge RN, response was "we do not have the staff to give a sitter. See if family can stay." Spoke with Daughter [name] from [place] who says she cannot stay. Did provide education regarding his condition and safety concerns, however she remains adamant that she cannot stay. Paged on call MD, hoping to get some anxiolytic medication to ease the patient's distress.

## 2020-02-29 NOTE — Progress Notes (Signed)
Contacted provider requesting changing patient medications from PO ( due to NPO) to IV.

## 2020-02-29 NOTE — Evaluation (Signed)
Physical Therapy Evaluation Patient Details Name: [name] MRN: [id number] DOB: [birth year] Today's Date: 02/29/2020   History of Present Illness  Pt is a 79 year old male who presented with L-sided weakness, L facial droop, and confusion after being on the ground at home. He did not receive tPA and NIH 14-16. 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 It also showed "Associated petechial hemorrhage at the right basal ganglia without frank hemorrhagic transformation or significant regional mass effect", per MRI report. Medical hx of: CAD, DM, and ischemic cardiomyopathy.  Clinical Impression  Pt fixates on receiving water this date, despite repeated education on safety precautions, indicating memory and safety/judgement deficits that impair his safety. He is also impulsive and requires continual cues to remain on task. He displays weakness in his L LE, primary in his hip flexors and quads, which impair his ability to maintain stance time and advance his LE with swing during gait. He required minA and continual cues to sequence transitions supine to/from sit in bed and modA to come to stand along with R HHA to maintain standing/gait balance. Pt would benefit from CIR upon d/c secondary to his drastic change in function compared to PLOF, lack of support to assist him at home, and ability to withstand increased treatment sessions. Will continue to follow acutely to address his deficits to maximize his independence and safety with all functional mobility.     Follow Up Recommendations CIR;Supervision/Assistance - 24 hour    Equipment Recommendations  Rolling walker with 5" wheels;3in1 (PT) (TBD as pt progresses)    Recommendations for Other Services Rehab consult     Precautions / Restrictions Precautions Precautions: Fall Restrictions Weight Bearing Restrictions: No      Mobility  Bed Mobility Overal bed mobility: Needs  Assistance Bed Mobility: Supine to Sit;Sit to Supine     Supine to sit: Min assist Sit to supine: Min assist   General bed mobility comments: Pt able to initiate moving B LEs towards EOB and pulling on side of bed to assist with trunk ascension, but required TCs and VCs to manage L LE fully off EOB and to complete trunk ascension. MinA to manage L LE superiorly onto bed.    Transfers Overall transfer level: Needs assistance Equipment used: 1 person hand held assist Transfers: Sit to/from Stand Sit to Stand: Mod assist         General transfer comment: ModA and extra time to power up to stand, cues for proper hand placement as he tried to grab therapit's leg or arm to pull up.  Ambulation/Gait Ambulation/Gait assistance: Mod assist Gait Distance (Feet): 5 Feet Assistive device: 1 person hand held assist Gait Pattern/deviations: Step-to pattern;Decreased step length - left;Decreased stance time - left;Decreased stance time - right;Decreased stride length;Shuffle;Narrow base of support Gait velocity: decreased Gait velocity interpretation: <1.31 ft/sec, indicative of household ambulator General Gait Details: Requires TCs and VCs to correct decreased L stance time and L step length as he tends to shuffle his B feet, esp demonstrating difficulty with advancing L LE. R UE support on therapist, modA to maintain balance.  Stairs            Wheelchair Mobility    Modified Rankin (Stroke Patients Only) Modified Rankin (Stroke Patients Only) Pre-Morbid Rankin Score: No symptoms Modified Rankin: Moderately severe disability     Balance Overall balance assessment: Needs assistance Sitting-balance support: Bilateral upper extremity supported;Feet supported Sitting  balance-Leahy Scale: Poor Sitting balance - Comments: Pt able to maintain static sitting balance with min guard, but unable to lift L LE without posteriorlean/LOB.   Standing balance support: Single extremity  supported;During functional activity Standing balance-Leahy Scale: Poor Standing balance comment: Trunk sway and unsteadiness noted in standing with R HHA.                             Pertinent Vitals/Pain Pain Assessment: No/denies pain    Home Living Family/patient expects to be discharged to:: Private residence Living Arrangements: Alone Available Help at Discharge: Neighbor;Available 24 hours/day (per pt, but per chart appears to be intermittent check up) Type of Home: House Home Access: Stairs to enter Entrance Stairs-Rails: None Entrance Stairs-Number of Steps: 2 Home Layout: Two level;Able to live on main level with bedroom/bathroom Home Equipment: Grab bars - toilet;Cane - single point;Wheelchair - manual (uses w/c in home, [facility] in community)      Prior Function Level of Independence: Independent with assistive device(s)         Comments: Pt drives.     Hand Dominance   Dominant Hand: Right    Extremity/Trunk Assessment   Upper Extremity Assessment Upper Extremity Assessment: Defer to OT evaluation    Lower Extremity Assessment Lower Extremity Assessment: LLE deficits/detail LLE Deficits / Details: Decreased L hip flexion and knee extension strength compared to R, with MMT of 3+ with hip flexion and 4 with knee extension compared to 5 on R LLE Sensation: WNL;decreased light touch (no sensation on B dorsal big toes with light touch) LLE Coordination: WNL       Communication   Communication: No difficulties (whispers)  Cognition Arousal/Alertness: Awake/alert Behavior During Therapy: Agitated;Impulsive Overall Cognitive Status: Impaired/Different from baseline Area of Impairment: Attention;Memory;Following commands;Safety/judgement;Awareness;Problem solving                   Current Attention Level: Sustained Memory: Decreased recall of precautions;Decreased short-term memory Following Commands: Follows one step commands  inconsistently;Follows one step commands with increased time Safety/Judgement: Decreased awareness of safety;Decreased awareness of deficits Awareness: Emergent Problem Solving: Slow processing;Decreased initiation;Difficulty sequencing;Requires verbal cues;Requires tactile cues General Comments: Pt fixated on wanting water this date and required repeated cues and continual conversation to educate pt on safety concerns with swallowing at this time. Required step-by-step cues to sequence mobility and to maintain safety. Pt attempts to grab therapist, non-aggresively, at times, and requires repeated cues to avoid this.      General Comments General comments (skin integrity, edema, etc.): scabs noted on knees    Exercises     Assessment/Plan    PT Assessment Patient needs continued PT services  PT Problem List Decreased strength;Decreased activity tolerance;Decreased balance;Decreased mobility;Decreased cognition;Decreased knowledge of use of DME;Decreased safety awareness;Decreased knowledge of precautions;Decreased skin integrity       PT Treatment Interventions DME instruction;Gait training;Stair training;Functional mobility training;Therapeutic activities;Therapeutic exercise;Balance training;Neuromuscular re-education;Cognitive remediation    PT Goals (Current goals can be found in the Care Plan section)  Acute Rehab PT Goals Patient Stated Goal: to have water PT Goal Formulation: With patient Time For Goal Achievement: 03/14/20 Potential to Achieve Goals: Good    Frequency Min 4X/week   Barriers to discharge Decreased caregiver support      Co-evaluation               AM-PAC PT "6 Clicks" Mobility  Outcome Measure Help needed turning from your back to your side  while in a flat bed without using bedrails?: A Little Help needed moving from lying on your back to sitting on the side of a flat bed without using bedrails?: A Little Help needed moving to and from a bed to a  chair (including a wheelchair)?: A Lot Help needed standing up from a chair using your arms (e.g., wheelchair or bedside chair)?: A Lot Help needed to walk in hospital room?: A Lot Help needed climbing 3-5 steps with a railing? : A Lot 6 Click Score: 14    End of Session Equipment Utilized During Treatment: Gait belt Activity Tolerance: Patient tolerated treatment well Patient left: in bed;with call bell/phone within reach;with bed alarm set Nurse Communication: Mobility status PT Visit Diagnosis: Unsteadiness on feet (R26.81);Other abnormalities of gait and mobility (R26.89);Muscle weakness (generalized) (M62.81);Difficulty in walking, not elsewhere classified (R26.2);Other symptoms and signs involving the nervous system (R29.898);Hemiplegia and hemiparesis Hemiplegia - Right/Left: Left Hemiplegia - dominant/non-dominant: Non-dominant Hemiplegia - caused by: Cerebral infarction    Time: 1165-7903 PT Time Calculation (min) (ACUTE ONLY): 25 min   Charges:   PT Evaluation $PT Eval Moderate Complexity: 1 Mod PT Treatments $Gait Training: 8-22 mins        Raymond Gurney, PT, DPT Acute Rehabilitation Services  Pager: [phone] Office: [phone]   Jewel Baize 02/29/2020, 8:57 AM

## 2020-03-01 ENCOUNTER — Inpatient Hospital Stay (HOSPITAL_COMMUNITY): Payer: Medicare PPO

## 2020-03-01 DIAGNOSIS — Z515 Encounter for palliative care: Secondary | ICD-10-CM

## 2020-03-01 DIAGNOSIS — I428 Other cardiomyopathies: Secondary | ICD-10-CM | POA: Diagnosis not present

## 2020-03-01 DIAGNOSIS — I63511 Cerebral infarction due to unspecified occlusion or stenosis of right middle cerebral artery: Secondary | ICD-10-CM | POA: Diagnosis not present

## 2020-03-01 LAB — GLUCOSE, CAPILLARY
Glucose-Capillary: 167 mg/dL — ABNORMAL HIGH (ref 70–99)
Glucose-Capillary: 135 mg/dL — ABNORMAL HIGH (ref 70–99)
Glucose-Capillary: 211 mg/dL — ABNORMAL HIGH (ref 70–99)
Glucose-Capillary: 63 mg/dL — ABNORMAL LOW (ref 70–99)
Glucose-Capillary: 138 mg/dL — ABNORMAL HIGH (ref 70–99)

## 2020-03-01 LAB — CBC
HCT: 35.6 % — ABNORMAL LOW (ref 39.0–52.0)
Hemoglobin: 11.7 g/dL — ABNORMAL LOW (ref 13.0–17.0)
MCH: 33 pg (ref 26.0–34.0)
MCHC: 32.9 g/dL (ref 30.0–36.0)
MCV: 100.3 fL — ABNORMAL HIGH (ref 80.0–100.0)
Platelets: 149 10*3/uL — ABNORMAL LOW (ref 150–400)
RBC: 3.55 MIL/uL — ABNORMAL LOW (ref 4.22–5.81)
RDW: 12.8 % (ref 11.5–15.5)
WBC: 9.3 10*3/uL (ref 4.0–10.5)
nRBC: 0 % (ref 0.0–0.2)

## 2020-03-01 LAB — BASIC METABOLIC PANEL
Anion gap: 15 (ref 5–15)
BUN: 26 mg/dL — ABNORMAL HIGH (ref 8–23)
CO2: 17 mmol/L — ABNORMAL LOW (ref 22–32)
Calcium: 8.8 mg/dL — ABNORMAL LOW (ref 8.9–10.3)
Chloride: 111 mmol/L (ref 98–111)
Creatinine, Ser: 1.42 mg/dL — ABNORMAL HIGH (ref 0.61–1.24)
GFR, Estimated: 51 mL/min — ABNORMAL LOW (ref >60)
Glucose, Bld: 80 mg/dL (ref 70–99)
Potassium: 3.8 mmol/L (ref 3.5–5.1)
Sodium: 143 mmol/L (ref 135–145)

## 2020-03-01 LAB — HEPARIN LEVEL (UNFRACTIONATED)
Heparin Unfractionated: 0.33 IU/mL (ref 0.30–0.70)
Heparin Unfractionated: 0.37 IU/mL (ref 0.30–0.70)

## 2020-03-01 LAB — ECHOCARDIOGRAM LIMITED
Height: 71 in
Weight: 2080.01 oz

## 2020-03-01 LAB — CK: Total CK: 277 U/L (ref 49–397)

## 2020-03-01 MED ORDER — PERFLUTREN LIPID MICROSPHERE
1.0000 mL | INTRAVENOUS | Status: AC | PRN
  Administered 2020-03-01: 2 mL via INTRAVENOUS
  Filled 2020-03-01: qty 10

## 2020-03-01 MED ORDER — DEXTROSE 50 % IV SOLN
12.5000 g | INTRAVENOUS | Status: AC
  Administered 2020-03-01: 12.5 g via INTRAVENOUS
  Filled 2020-03-01: qty 50

## 2020-03-01 MED ORDER — APIXABAN 5 MG PO TABS
5.0000 mg | ORAL_TABLET | Freq: Two times a day (BID) | ORAL | Status: DC
  Administered 2020-03-01 – 2020-03-05 (×9): 5 mg via ORAL
  Filled 2020-03-01 (×9): qty 1

## 2020-03-01 NOTE — Progress Notes (Signed)
ANTICOAGULATION CONSULT NOTE - Follow Up Consult  Pharmacy Consult for Heparin > Eliquis Indication: stroke and LV thrombus  No Known Allergies  Patient Measurements: Height: 5\' 11"  (180.3 cm) Weight: 59 kg (130 lb) IBW/kg (Calculated) : 75.3 Heparin Dosing Weight: 59 kg  Vital Signs: Temp: 98 F (36.7 C) (11/06 1135) Temp Source: Oral (11/06 0800) BP: 157/89 (11/06 1135) Pulse Rate: 96 (11/06 1135)  Labs: Recent Labs    02/28/20 1123 02/28/20 1123 02/28/20 1409 02/28/20 1409 02/29/20 0206 02/29/20 0206 02/29/20 0957 03/01/20 0057 03/01/20 0311 03/01/20 1008  HGB 12.1*   < > 11.6*   < > 12.3*  --   --  11.7*  --   --   HCT 38.1*   < > 34.0*  --  37.5*  --   --  35.6*  --   --   PLT 219  --   --   --  204  --   --  149*  --   --   APTT 23*  --   --   --   --   --   --   --   --   --   LABPROT 13.6  --   --   --   --   --   --   --   --   --   INR 1.1  --   --   --   --   --   --   --   --   --   HEPARINUNFRC  --   --   --   --  0.11*   < > 0.14*  --  0.33 0.37  CREATININE 1.55*   < > 1.40*  --  1.35*  --   --  1.42*  --   --   CKTOTAL 745*  --   --   --   --   --   --  277  --   --    < > = values in this interval not displayed.    Estimated Creatinine Clearance: 36.4 mL/min (A) (by C-G formula based on SCr of 1.42 mg/dL (H)).  Assessment:   79 yo m presenting with AMS and left sided weakness - found on floor after 3 days, not moving left side. Of note patient had an ECHO in October that showed persistent LV thrombus and was started on Eliquis, last dose taken 11/2 per med rec, but some concern for noncompliance.   Pharmacy consulted to dose IV heparin for new subacute stroke on 11/4. Now to change back to Eliquis.    Heparin level therapeutic (0.37) on 850 units/hr.  Hgb stable, platelet count has trended down some.  No bleeding reported.   Goal of Therapy:  Heparin level 0.3-0.5 units/ml appropriate Eliquis dose for indication Monitor platelets by  anticoagulation protocol: Yes   Plan:   Resume Eliquis 5 mg PO BID.  Stop IV heparin when giving first Eliquis dose.  Follow up CBC, monitor for any signs/symptoms of bleeding.  13/4, RPh Phone: [phone] 03/01/2020,12:17 PM

## 2020-03-01 NOTE — Progress Notes (Signed)
  Echocardiogram 2D Echocardiogram has been performed.  Stark Bray Swaim 03/01/2020, 1:49 PM

## 2020-03-01 NOTE — Progress Notes (Signed)
Inpatient Rehab Admissions:  Inpatient Rehab Consult received.  I met with patient at the bedside for rehabilitation assessment and to discuss goals and expectations of an inpatient rehab admission.  Pt acknowledged understanding of CIR goals and expectations.  However, given pt's apparent confusion, asked pt for permission to contact family. Pt gave permission to contact daughter, Olivia Mackie.  Spoke with Olivia Mackie on phone.  Olivia Mackie acknowledged understanding of CIR goals and expectations. Olivia Mackie also acknowledged appropriate disposition for pt to be considered for potential admission to CIR. Pt and daughter interested in pursuing CIR for further therapy.  Pt appears to be an appropriate candidate for potential CIR admission.  Will continue to follow.  Signed: Gayland Curry, [place], [place] Admissions Coordinator [phone]

## 2020-03-01 NOTE — Progress Notes (Signed)
  Speech Language Pathology Treatment: Dysphagia  Patient Details Name: [name] MRN: [id number] DOB: 04-10-1941 Today's Date: 03/01/2020 Time: 6144-3154 SLP Time Calculation (min) (ACUTE ONLY): 13 min  Assessment / Plan / Recommendation Clinical Impression  Pt presents with increasing signs of pharyngeal dysphagia with PO intake on modified consistencies observed to be safe during MBSS on 11/5.  Pt exhibited explosive cough with nectar thick liquids with expectoration of portion of bolus. There was throat clearing after puree as well. With honey thick liquid there was coughing noted as well.    Recommend holding POs with re-evaluation of swallow function scheduled later this afternoon.   HPI HPI: Pt is a 79 year old male who presented with L-sided weakness, L facial droop, and confusion after being on the ground at home and appears with abrasion on left side of his face. He did not receive tPA and NIH 14-16. 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 It also showed "Associated petechial hemorrhage at the right basal ganglia without frank hemorrhagic transformation or significant regional mass effect", per MRI report. Medical hx of: CAD, DM, tobaccos use and ischemic cardiomyopathy.  Pt with agitation over night but per chart review, appeared with improved mentation/improving left sided strength.  Pt noted to overt cough with consumption of coffee overnight and was made NPO with swallow evaluation ordered.  Pt states he has a h/o a stroke with left sided weakness, chart review yielded findings of TIA.      SLP Plan  MBS       Recommendations  Diet recommendations: NPO Medication Administration: Via alternative means (priority oral meds crushed with puree)                Oral Care Recommendations: Oral care QID Follow up Recommendations: Inpatient Rehab SLP Visit Diagnosis: Dysphagia, oropharyngeal phase  (R13.12) Plan: MBS       GO                Kerrie Pleasure, MA, CCC-SLP Acute Rehabilitation Services Office: [phone]; Pager (11/6): [phone] 03/01/2020, 2:32 PM

## 2020-03-01 NOTE — Progress Notes (Signed)
Modified Barium Swallow Progress Note  Patient Details  Name: [name] MRN: [id number] Date of Birth: 04-08-1941  Today's Date: 03/01/2020  Modified Barium Swallow completed.  Full report located under Chart Review in the Imaging Section.  Brief recommendations include the following:  Clinical Impression  Pt presents with moderate oropharyngeal dysphagia c/b poor bolus awareness, decreased lingual strength and coordination, piecemeal deglutition, delayed swallow initiation, and reduced laryngeal closure.  These deficits resulted in frank aspiration of thin liquid on 1 of two trials, and transient penetration of nectar thick liquid.  Pt's biggest barrier with PO trials today was oral holding. Pt requried significant cuing and encouragement to swallow.  Pt occasionally benefited from circumlaryngeal massage to cue pharyngeal swallow response. Pt did not aspirate nectar thick liquid during this study, but based on clinical presentation this afternoon at bedside, aspiration of nectar and honey thick liquids suspected at that time as pt presented similarly to what was observed during visualized aspiration of thin liquid during this study.  As pt presently appears to be protecting airway with nectar thick liquid and puree consistencies, recommend continuing current diet, but if pt again shows clinical s/s of aspiration with these consistencies hold POs.  Check oral cavity for holding as well and cue pt to swallow.  Pt may not accept much PO intake with meals.  SLP to follow.   Swallow Evaluation Recommendations           Liquid Administration via: Cup;Straw       Supervision: Full supervision/cueing for compensatory strategies;Full assist for feeding   Compensations: Slow rate;Small sips/bites (Check oral cavity for residue/holding; verbal cues to swallow)   Postural Changes: Seated upright at 90 degrees       Other Recommendations: Order thickener from pharmacy    Kerrie Pleasure, MA,  CCC-SLP Acute Rehabilitation Services Office: [phone]  03/01/2020,5:39 PM

## 2020-03-01 NOTE — Progress Notes (Addendum)
Notified by telemetry that pt. has had frequent PVCs, bigeminy runs and runs of VTach over the last hour. Pt. was at barium swallow during first 30 mins. Pt. assessed and denies any chest pain or palpitations. VS obtained. Notified MD. Temp orally-100.5. Administered PRN Tylenol rectally. No new orders at this time. Will continue to monitor.

## 2020-03-01 NOTE — Progress Notes (Signed)
STROKE TEAM PROGRESS NOTE   INTERVAL HISTORY Neuro stable overnight  Vitals:   02/29/20 2057 02/29/20 2351 03/01/20 0431 03/01/20 0800  BP: 128/84 (!) 145/74 (!) 149/89 140/80  Pulse: 74 75 82 93  Resp: 18 18 18 20   Temp: 97.9 F (36.6 C) 97.6 F (36.4 C) (!) 97.4 F (36.3 C) (!) 97.4 F (36.3 C)  TempSrc: Oral Oral Oral Oral  SpO2:  100% 100%   Weight:      Height:       CBC:  Recent Labs  Lab 02/28/20 1123 02/28/20 1409 02/29/20 0206 03/01/20 0057  WBC 8.6   < > 8.4 9.3  NEUTROABS 6.4  --   --   --   HGB 12.1*   < > 12.3* 11.7*  HCT 38.1*   < > 37.5* 35.6*  MCV 100.8*   < > 98.7 100.3*  PLT 219   < > 204 149*   < > = values in this interval not displayed.   Basic Metabolic Panel:  Recent Labs  Lab 02/29/20 0206 03/01/20 0057  NA 149* 143  K 3.7 3.8  CL 113* 111  CO2 22 17*  GLUCOSE 89 80  BUN 32* 26*  CREATININE 1.35* 1.42*  CALCIUM 9.6 8.8*   Lipid Panel:  Recent Labs  Lab 02/29/20 0206  CHOL 190  TRIG 78  HDL 57  CHOLHDL 3.3  VLDL 16  LDLCALC 761*   HgbA1c:  Recent Labs  Lab 02/28/20 1704  HGBA1C 6.8*   Urine Drug Screen:  Recent Labs  Lab 02/28/20 1211  LABOPIA NONE DETECTED  COCAINSCRNUR NONE DETECTED  LABBENZ NONE DETECTED  AMPHETMU NONE DETECTED  THCU NONE DETECTED  LABBARB NONE DETECTED    Alcohol Level  Recent Labs  Lab 02/28/20 1123  ETH <10    IMAGING past 24 hours DG Swallowing Func-Speech Pathology  Result Date: 02/29/2020 Objective Swallowing Evaluation: Type of Study: MBS-Modified Barium Swallow Study  Patient Details Name: [name] MRN: [id number] Date of Birth: 07-23-1940 Today's Date: 02/29/2020 Time: SLP Start Time (ACUTE ONLY): 1220 -SLP Stop Time (ACUTE ONLY): 1240 SLP Time Calculation (min) (ACUTE ONLY): 20 min Past Medical History: Past Medical History: Diagnosis Date . Coronary artery disease   a. 2007 dLAD 100->med rx;  b. 2011 PCI: 80 LAD (Promus 2.75x18); c. 03/2015 MV: predom fixed inf/antapical  defefcts w/ ischemia, depressed EF;  c. 04/2015 NSTEMI/PCI: LM nl,LAD 40p ISR, 100d (2.5x20 Promus DES), LCX 51m (3.0x20 Promus DES), RCA 30p, 43m, 70d.  . Diabetes mellitus without complication (HCC)  . Dyslipidemia  . Hx MRSA infection  . Ischemic cardiomyopathy   a. 04/2015 Echo: EF 25-30%, apical AK, Gr 2 DD, triv AI, mild MR, mildly dil LA, PASP . . Tobacco abuse  Past Surgical History: Past Surgical History: Procedure Laterality Date . CARDIAC CATHETERIZATION N/A 05/16/2015  Procedure: Left Heart Cath and Coronary Angiography;  Surgeon: Tonny Bollman, MD;  Location: [facility] INVASIVE CV LAB;  Service: Cardiovascular;  Laterality: N/A; . CARDIAC CATHETERIZATION N/A 05/16/2015  Procedure: Coronary Stent Intervention;  Surgeon: Tonny Bollman, MD;  Location: [facility] INVASIVE CV LAB;  Service: Cardiovascular;  Laterality: N/A; . TOTAL SHOULDER REPLACEMENT    Right HPI: Pt is a 79 year old male who presented with L-sided weakness, L facial droop, and confusion after being on the ground at home and appears with abrasion on left side of his face. He did not receive tPA and NIH 14-16. MRI of head revealed acute to subacute  ischemic R MCA infarct including R basal ganglia and R frontal lobe. It also showed "Associated petechial hemorrhage at the right basal ganglia without frank hemorrhagic transformation or significant regional mass effect", per MRI report. Medical hx of: CAD, DM, tobaccos use and ischemic cardiomyopathy.  Pt with agitation over night but per chart review, appeared with improved mentation/improving left sided strength.  Pt noted to overt cough with consumption of coffee overnight and was made NPO with swallow evaluation ordered.  Pt states he has a h/o a stroke with left sided weakness, chart review yielded findings of TIA.  Subjective: alert Assessment / Plan / Recommendation CHL IP CLINICAL IMPRESSIONS 02/29/2020 Clinical Impression Pt presents with a mild oropharyngeal dysphagia marked primarily by reduced  sensorimotor function within the oral phase.  There is reduced labial seal, leading to leaking of liquid boluses from the left side of mouth; disorganized bolus formation and lingual pumping, retention in the left oral cavity post-swallow (requiring oral suctioning at the end of the study).  Thin liquids spilled into the laryngeal vestibule and were aspirated before and during the swallow. Pt had an explosive cough in reaction to aspiration.  Nectar thick liquids were swallowed safely with no penetration nor aspiration over repeated trials. There was little to no pharyngeal residue post -swallow.  For now, recommend beginning a dysphagia 1 diet with nectar thick liquids.  Pt may have small sips of water outside meals and after oral care.  Crush meds for now.  SLP will follow and can advance diet based on clinical observations without repeating MBS.  SLP Visit Diagnosis Dysphagia, oropharyngeal phase (R13.12) Attention and concentration deficit following -- Frontal lobe and executive function deficit following -- Impact on safety and function Mild aspiration risk   CHL IP TREATMENT RECOMMENDATION 02/29/2020 Treatment Recommendations Therapy as outlined in treatment plan below   No flowsheet data found. CHL IP DIET RECOMMENDATION 02/29/2020 SLP Diet Recommendations Dysphagia 1 (Puree) solids;Nectar thick liquid Liquid Administration via Cup;Straw Medication Administration Crushed with puree Compensations -- Postural Changes --   CHL IP OTHER RECOMMENDATIONS 02/29/2020 Recommended Consults -- Oral Care Recommendations Oral care BID Other Recommendations Order thickener from pharmacy   CHL IP FOLLOW UP RECOMMENDATIONS 02/29/2020 Follow up Recommendations Inpatient Rehab   CHL IP FREQUENCY AND DURATION 02/29/2020 Speech Therapy Frequency (ACUTE ONLY) min 2x/week Treatment Duration 2 weeks      CHL IP ORAL PHASE 02/29/2020 Oral Phase Impaired Oral - Pudding Teaspoon -- Oral - Pudding Cup -- Oral - Honey Teaspoon -- Oral - Honey  Cup -- Oral - Nectar Teaspoon -- Oral - Nectar Cup -- Oral - Nectar Straw Left anterior bolus loss;Weak lingual manipulation;Lingual pumping;Reduced posterior propulsion;Decreased bolus cohesion Oral - Thin Teaspoon -- Oral - Thin Cup -- Oral - Thin Straw Left anterior bolus loss;Weak lingual manipulation;Lingual pumping;Reduced posterior propulsion;Decreased bolus cohesion Oral - Puree Weak lingual manipulation;Lingual pumping;Reduced posterior propulsion;Decreased bolus cohesion Oral - Mech Soft -- Oral - Regular -- Oral - Multi-Consistency -- Oral - Pill -- Oral Phase - Comment --  CHL IP PHARYNGEAL PHASE 02/29/2020 Pharyngeal Phase Impaired Pharyngeal- Pudding Teaspoon -- Pharyngeal -- Pharyngeal- Pudding Cup -- Pharyngeal -- Pharyngeal- Honey Teaspoon -- Pharyngeal -- Pharyngeal- Honey Cup -- Pharyngeal -- Pharyngeal- Nectar Teaspoon -- Pharyngeal -- Pharyngeal- Nectar Cup -- Pharyngeal -- Pharyngeal- Nectar Straw Delayed swallow initiation-pyriform sinuses;Reduced laryngeal elevation Pharyngeal -- Pharyngeal- Thin Teaspoon -- Pharyngeal -- Pharyngeal- Thin Cup -- Pharyngeal -- Pharyngeal- Thin Straw Delayed swallow initiation-pyriform sinuses;Reduced airway/laryngeal closure;Penetration/Aspiration before swallow;Penetration/Aspiration during  swallow;Trace aspiration;Reduced laryngeal elevation Pharyngeal Material enters airway, passes BELOW cords and not ejected out despite cough attempt by patient Pharyngeal- Puree Delayed swallow initiation-vallecula;Reduced laryngeal elevation Pharyngeal -- Pharyngeal- Mechanical Soft -- Pharyngeal -- Pharyngeal- Regular -- Pharyngeal -- Pharyngeal- Multi-consistency -- Pharyngeal -- Pharyngeal- Pill -- Pharyngeal -- Pharyngeal Comment --  No flowsheet data found. Blenda Mounts Laurice 02/29/2020, 3:11 PM              ECHOCARDIOGRAM COMPLETE  Result Date: 02/29/2020    ECHOCARDIOGRAM REPORT   Patient Name:   [name] Date of Exam: 02/29/2020 Medical Rec #:   [id number]     Height:       71.0 in Accession #:    [id number]    Weight:       130.0 lb Date of Birth:  [birth year]      BSA:          1.756 m Patient Age:    79 years      BP:           166/78 mmHg Patient Gender: M             HR:           52 bpm. Exam Location:  Inpatient Procedure: 2D Echo, Cardiac Doppler and Color Doppler Indications:    TIA 435.9 / G45.9  History:        Patient has prior history of Echocardiogram examinations, most                 recent 09/08/2015. Cardiomyopathy, CAD; Risk Factors:Current                 Smoker, Dyslipidemia and Diabetes.  Sonographer:    Eulah Pont RDCS Referring Phys: [id number] SRISHTI L BHAGAT IMPRESSIONS  1. LV systolic function is mildly decreased with akinesis at the distal lateral, apical, basal inferior/inferolateral walls. Cannot exclude thrombus at LV apex Would recomm ordering limited echo with Definity to furhter evaluate. . Left ventricular ejection fraction, by estimation, is 45 to 50%. The left ventricle has mildly decreased function. The left ventricle demonstrates regional wall motion abnormalities (see scoring diagram/findings for description). Left ventricular diastolic parameters are  consistent with Grade I diastolic dysfunction (impaired relaxation).  2. Right ventricular systolic function is normal. The right ventricular size is normal.  3. The mitral valve is normal in structure. No evidence of mitral valve regurgitation.  4. The aortic valve is tricuspid. Aortic valve regurgitation is not visualized. Mild aortic valve sclerosis is present, with no evidence of aortic valve stenosis.  5. The inferior vena cava is normal in size with greater than 50% respiratory variability, suggesting right atrial pressure of 3 mmHg. FINDINGS  Left Ventricle: LV systolic function is mildly decreased with akinesis at the distal lateral, apical, basal inferior/inferolateral walls. Cannot exclude thrombus at LV apex Would recomm ordering limited echo with Definity to  furhter evaluate. Left ventricular ejection fraction, by estimation, is 45 to 50%. The left ventricle has mildly decreased function. The left ventricle demonstrates regional wall motion abnormalities. The left ventricular internal cavity size was normal in size. There is no left ventricular hypertrophy. Left ventricular diastolic parameters are consistent with Grade I diastolic dysfunction (impaired relaxation). Right Ventricle: The right ventricular size is normal. No increase in right ventricular wall thickness. Right ventricular systolic function is normal. Left Atrium: Left atrial size was normal in size. Right Atrium: Right atrial size was normal in size. Pericardium: There is no evidence of  pericardial effusion. Mitral Valve: The mitral valve is normal in structure. No evidence of mitral valve regurgitation. Tricuspid Valve: The tricuspid valve is normal in structure. Tricuspid valve regurgitation is mild. Aortic Valve: The aortic valve is tricuspid. Aortic valve regurgitation is not visualized. Mild aortic valve sclerosis is present, with no evidence of aortic valve stenosis. Pulmonic Valve: The pulmonic valve was normal in structure. Pulmonic valve regurgitation is not visualized. Aorta: The aortic root is normal in size and structure. Venous: The inferior vena cava is normal in size with greater than 50% respiratory variability, suggesting right atrial pressure of 3 mmHg. IAS/Shunts: No atrial level shunt detected by color flow Doppler.  LEFT VENTRICLE PLAX 2D LVIDd:         4.20 cm  Diastology LVIDs:         3.20 cm  LV e' medial:    3.08 cm/s LV PW:         0.90 cm  LV E/e' medial:  16.4 LV IVS:        0.90 cm  LV e' lateral:   6.24 cm/s LVOT diam:     2.20 cm  LV E/e' lateral: 8.1 LV SV:         78 LV SV Index:   44 LVOT Area:     3.80 cm  RIGHT VENTRICLE RV S prime:     8.70 cm/s TAPSE (M-mode): 1.6 cm LEFT ATRIUM             Index       RIGHT ATRIUM           Index LA diam:        2.20 cm 1.25 cm/m   RA Area:     13.80 cm LA Vol (A2C):   44.3 ml 25.23 ml/m RA Volume:   32.00 ml  18.23 ml/m LA Vol (A4C):   44.9 ml 25.57 ml/m LA Biplane Vol: 44.9 ml 25.57 ml/m  AORTIC VALVE LVOT Vmax:   90.05 cm/s LVOT Vmean:  58.200 cm/s LVOT VTI:    0.205 m  AORTA Ao Root diam: 3.80 cm Ao Asc diam:  3.10 cm MITRAL VALVE MV Area (PHT): 2.62 cm    SHUNTS MV Decel Time: 290 msec    Systemic VTI:  0.20 m MV E velocity: 50.60 cm/s  Systemic Diam: 2.20 cm MV A velocity: 72.50 cm/s MV E/A ratio:  0.70 Ivan Pates MD Electronically signed by Ivan Pates MD Signature Date/Time: 02/29/2020/5:22:09 PM    Final     PHYSICAL EXAM  Temp:  [97.4 F (36.3 C)-97.9 F (36.6 C)] 97.4 F (36.3 C) (11/06 0800) Pulse Rate:  [74-93] 93 (11/06 0800) Resp:  [18-20] 20 (11/06 0800) BP: (128-149)/(74-89) 140/80 (11/06 0800) SpO2:  [100 %] 100 % (11/06 0431)  General - Well nourished, well developed, in no apparent distress.  Ophthalmologic - fundi not visualized due to noncooperation.  Cardiovascular - Regular rhythm and rate, not in afib.  Mental Status -  Level of arousal and orientation to time, place, and person were intact. Language including expression, naming, repetition, comprehension was assessed and found intact. Mild dysarthria  Cranial Nerves II - XII - II - Visual field intact OU. III, IV, VI - Extraocular movements intact. V - Facial sensation intact bilaterally. VII - left facial droop. VIII - Hearing & vestibular intact bilaterally. X - Palate elevates symmetrically. XI - Chin turning & shoulder shrug intact bilaterally. XII - Tongue protrusion intact.  Motor Strength - The patient's  strength was symmetrical BLEs with proximal 3/5 and drift within 5 secs, and 4/5 toe DF. RUE at least 4/5 and LUE 2+/5 proximal and 0/5 finger grip. Bulk was normal and fasciculations were absent.   Motor Tone - Muscle tone was assessed at the neck and appendages and was normal.  Reflexes - The patient's reflexes were  symmetrical in all extremities and he had no pathological reflexes.  Sensory - Light touch, temperature/pinprick were assessed and were symmetrical.    Coordination - The patient had normal movements in the right hand with no ataxia or dysmetria.  Tremor was absent.  Gait and Station - deferred.   ASSESSMENT/PLAN [name] is a 79 y.o. male with history of tobacco abuse, ischemic cardiomyopathy with recent concern for LV thrombus on OP echo, tachy-brady syndrome, dyslipidemia, diabetes and CAD presenting with altered mental status and L sided weakness.   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 Chronic R cerebellar and prominent L MCA branch atherosclerosis.   MRI  R MCA infarct R basal ganglia and R frontal lobe w/ petechial hemorrhage in R basal ganglia. Old R>L cerebellar infarcts.   MRA head proximal R M1 w/ high-grade stenosis vs occlusion. L P2 moderate stenosis.   CTA head and neck - Intracranial atherosclerosis including a severe proximal right M1 stenosis with diffuse attenuation of the right MCA more distally. Moderate to severe left P2 stenosis.  2D Echo EF 45-50%, can not rule out LV thrombus  TTE with contrast: Cannot exclude thrombus at LV apex   Limited TTE: pending  LDL 117  HgbA1c 6.8  UDS neg  VTE prophylaxis - IV heparin  aspirin 81 mg daily, Brilinta (ticagrelor) 90 mg bid and Eliquis (apixaban) daily prior to admission, now on heparin IV.   Therapy recommendations:  CIR  Disposition:  pending   ? LV thrombus  02/05/20 [facility] note - "Repeat ECHO with EF: 35-50% and persistent apical thrombus, grossly unchanged from prior"  However, 06/2018 and 01/2011 TTE showed EF 40-45% and apical thinning and akinesis, but did not mention apical thrombus.  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 Lucianne Muss started digoxin, lisinopril, imdur, hydralazine  Also stated on eliquis and lipitor as well as ASA 81  Recommend ZIO patch but left [facility] w/o  Not compliant with meds at home  Hypertension  Stable, on the high side at times . Permissive hypertension (OK if < 180/105 given on heparin IV) but gradually normalize in 3-5 days . Long-term BP goal 130-150 given right MCA high grade stenosis vs. occlusion  Hyperlipidemia  Home meds:  lipitor 80, resumed in hospital  LDL 117, goal < 70  Continue statin at discharge  Diabetes type II Controlled  HgbA1c 6.8, goal < 7.0  CBGs  SSI  PCP follow up  Dysphagia . Secondary to stroke . MBSS . Cleared for D1  . 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 Janae Sauce, patient can resume Eliquis at discharge. Only petechial hemorrhage, no frank hemorrhagic conversion, benefits of Eliquis outweigh the risk of bleeding especially in light of LV Thrombus. Stroke will sign off.   Personally examined patient and images, and have participated in and made any corrections needed to history, physical, neuro exam,assessment and plan as stated above.  I have personally obtained the history, evaluated lab date, reviewed imaging studies and agree with radiology interpretations.    Naomie Dean, MD Stroke Neurology  I spent 35 minutes of face-to-face and non-face-to-face time with patient. This included prechart review, lab review, study review, order entry, electronic health record documentation, patient education on the different diagnostic and therapeutic options, counseling and coordination of care,  risks and benefits of management, compliance, or risk factor reduction      To contact Stroke Continuity provider, please refer to [email or web address]. After hours, contact General Neurology

## 2020-03-01 NOTE — PMR Pre-admission (Addendum)
PMR Admission Coordinator Pre-Admission Assessment  Patient: [name] is an 79 y.o., male MRN: [id number] DOB: [birth year] Height: _0  (180.3 cm) Weight: 59 kg              Insurance Information HMO:     PPO: yes     PCP:      IPA:      80/20:      OTHER:  PRIMARY: [employer]      Policy#: [id number]      Subscriber: patient CM Name: [name]      Phone#: [phone] [id number]     Fax#: [phone] Pre-Cert#: [id number]      Employer:  Josem Kaufmann provided by [name] for admit to CIR. Updates due 11/15 to Lestine Box (p): [phone] [id number] (f): [phone] Benefits:  Phone #: n/a-[email or web address] at [email or web address]     Name:  Eff. Date: 04/27/2019-04/25/2020     Deduct: does not have for in-network providers      Out of Pocket Max: $4,000 ($275 met)      Life Max: NA  CIR: $160/day co-pay for days 1-10, $0/day co-pay for days 11-90      SNF: 100% coverage; limited by medical necessity Outpatient: NA     Co-Pay: $20/visit; visits limited by medical necessity Home Health: 100% coverage; limited by medical necessity      Co-Pay:  DME: 80% coverage     Co-Pay: 20% Providers: in-network  SECONDARY:       Policy#:       Phone#:   Development worker, community:       Phone#:   The Therapist, art Information Summary" for patients in Inpatient Rehabilitation Facilities with attached "Privacy Act [place] Records" was provided and verbally reviewed with: Family  Emergency Contact Information Contact Information     Name Relation Home Work Mobile   Rivas,Ivan Relative [phone]     Rivas, Ivan Daughter [phone]  [phone]      Current Medical History  Patient Admitting Diagnosis: R MCA infarct  History of Present Illness: Pt is a 79 y.o.right handed male with 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  Per chart review patient lives alone.  Two-level home bed and bath main level 2 steps to entry.  Independent with a straight point cane.  Presented 02/28/2020 with left-sided weakness.  Cranial CT/MRI scan showed subacute appearing right MCA infarction with confluent involvement of the basal ganglia.  Mild regional mass-effect.  No hemorrhage.  MRA of head and neck showed severe high-grade stenosis or possibly partially occlusive and/or recanalized thrombus within the proximal right M1 segment.  Patient did not receive TPA.  Admission chemistries alcohol negative, sodium 149, glucose 175, BUN 39, creatinine 1.55, CK 745, urine drug screen negative, hemoglobin A1c 6.8.  Echocardiogram pending.   Therapy evaluations completed with recommendations of physical medicine rehab consult. On 03/04/20, pt developed low grade fever with jump in WBC form 4.5 to 13.1. DC held and workup started. Per acute attending, antibiotics to be started. Pt is to admit to CIR on 03/05/20.   Complete NIHSS TOTAL: 15 Glasgow Coma Scale Score: 14  Past Medical History  Past Medical History:  Diagnosis Date   Coronary artery disease    a. 2007 dLAD 100->med rx;  b. 2011 PCI: 80 LAD (Promus 2.75x18); c. 03/2015 MV: predom fixed inf/antapical defefcts w/ ischemia, depressed  EF;  c. 04/2015 NSTEMI/PCI: LM nl,LAD 40p ISR, 100d (2.5x20 Promus DES), LCX 26m (3.0x20 Promus DES), RCA 30p, 58m, 70d.    Diabetes mellitus without complication ([name])    Dyslipidemia    Hx MRSA infection    Ischemic cardiomyopathy    a. 04/2015 Echo: EF 25-30%, apical AK, Gr 2 DD, triv AI, mild MR, mildly dil LA, PASP 2mmHg.   Tobacco abuse     Family History  family history is not on file.  Prior Rehab/Hospitalizations:  Has the patient had prior rehab or hospitalizations prior to admission? No  Has the patient had major surgery during 100 days prior to admission? No  Current Medications   Current Facility-Administered Medications:     acetaminophen (TYLENOL) tablet 650 mg, 650 mg, Oral, Q4H PRN **OR** acetaminophen (TYLENOL) 160 MG/5ML solution 650 mg, 650 mg, Per Tube, Q4H PRN **OR** acetaminophen (TYLENOL) suppository 650 mg, 650 mg, Rectal, Q4H PRN, Wynetta Fines T, MD   apixaban (ELIQUIS) tablet 5 mg, 5 mg, Oral, BID, Dahal, Binaya, MD   atorvastatin (LIPITOR) tablet 80 mg, 80 mg, Oral, q1800, Wynetta Fines T, MD, 80 mg at 02/29/20 2106   hydrALAZINE (APRESOLINE) injection 5 mg, 5 mg, Intravenous, Q6H PRN, Wynetta Fines T, MD, 5 mg at 02/29/20 0105   insulin aspart (novoLOG) injection 0-9 Units, 0-9 Units, Subcutaneous, TID WC, Ivan Halt, MD, 2 Units at 03/01/20 0758   LORazepam (ATIVAN) injection 0.5 mg, 0.5 mg, Intravenous, Once, Wynetta Fines T, MD   mupirocin cream (BACTROBAN) 2 %, , Topical, BID, Ivan Croak, MD, Given at 03/01/20 1013  Patients Current Diet:  Diet Order             DIET - DYS 1 Room service appropriate? Yes; Fluid consistency: Nectar Thick  Diet effective now                   Precautions / Restrictions Precautions Precautions: Fall Precaution Comments: SLP diet motivations that pt is unaware of and perseverates on water Restrictions Weight Bearing Restrictions: No   Has the patient had 2 or more falls or a fall with injury in the past year?Yes  Prior Activity Level Community (5-7x/wk): drives; pt stated gets out of house everyday  Prior Functional Level Prior Function Level of Independence: Independent with assistive device(s) Comments: Pt drives.  Self Care: Did the patient need help bathing, dressing, using the toilet or eating?  Independent  Indoor Mobility: Did the patient need assistance with walking from room to room (with or without device)? Independent  Stairs: Did the patient need assistance with internal or external stairs (with or without device)? Independent  Functional Cognition: Did the patient need help planning regular tasks such as shopping or remembering  to take medications? Unknown  Home Assistive Devices / Equipment Home Equipment: Grab bars - toilet, Cane - single point, Wheelchair - manual  Prior Device Use: Indicate devices/aids used by the patient prior to current illness, exacerbation or injury?  cane  Current Functional Level Cognition  Overall Cognitive Status: Impaired/Different from baseline Current Attention Level: Sustained Orientation Level: Oriented to person, Disoriented to situation, Disoriented to time, Disoriented to place Following Commands: Follows one step commands inconsistently, Follows one step commands with increased time Safety/Judgement: Decreased awareness of safety, Decreased awareness of deficits General Comments: pt fixated on wanting water and even when staff are acquiring information in chart and speaking aloud about looking at [facility] pt talks over therapist i want water. pt provided  spoon of water with aspirations and demanding more water to "wash it down" pt lacks any awareness to aspiration. pt taking the telemetry box and slamming it again the bed rail in response to yelling i need water. Pt provided an entire cup of apple juice nectar thick via spoon and continues to perseverate on liquids without awareness to just finsihing the drink. pt is able to states name and location as [facility]    Extremity Assessment (includes Sensation/Coordination)  Upper Extremity Assessment: LUE deficits/detail LUE Deficits / Details: activation noted in shoulder- pt decreased shoulder flexion to 40 degrees d= LUE Coordination: decreased fine motor, decreased gross motor  Lower Extremity Assessment: Defer to PT evaluation LLE Deficits / Details: Decreased L hip flexion and knee extension strength compared to R, with MMT of 3+ with hip flexion and 4 with knee extension compared to 5 on R LLE Sensation: WNL, decreased light touch (no sensation on B dorsal big toes with light touch) LLE Coordination:  WNL    ADLs  Overall ADL's : Needs assistance/impaired Eating/Feeding: Maximal assistance Eating/Feeding Details (indicate cue type and reason): unsafe due to impulsive Grooming: Wash/dry face, Minimal assistance, Sitting Upper Body Bathing: Moderate assistance Lower Body Bathing: Maximal assistance Upper Body Dressing : Moderate assistance Lower Body Dressing: Maximal assistance Toilet Transfer: Moderate assistance General ADL Comments: pt sit <>Stand and side steps due to agitation this session.     Mobility  Overal bed mobility: Needs Assistance Bed Mobility: Supine to Sit, Sit to Supine Supine to sit: Min assist Sit to supine: Mod assist General bed mobility comments: exiting R side of the bed and then returning. pt attempting x3 exit by himself with alarm sounding with OT standing outside the room. pt rattlingrail and demanding water again    Transfers  Overall transfer level: Needs assistance Equipment used: 1 person hand held assist Transfers: Sit to/from Stand Sit to Stand: Mod assist General transfer comment: support on L side and OT holding telemtry box    Ambulation / Gait / Stairs / Wheelchair Mobility  Ambulation/Gait Ambulation/Gait assistance: Mod assist Gait Distance (Feet): 5 Feet Assistive device: 1 person hand held assist Gait Pattern/deviations: Step-to pattern, Decreased step length - left, Decreased stance time - left, Decreased stance time - right, Decreased stride length, Shuffle, Narrow base of support General Gait Details: Requires TCs and VCs to correct decreased L stance time and L step length as he tends to shuffle his B feet, esp demonstrating difficulty with advancing L LE. R UE support on therapist, modA to maintain balance. Gait velocity: decreased Gait velocity interpretation: <1.31 ft/sec, indicative of household ambulator    Posture / Balance Dynamic Sitting Balance Sitting balance - Comments: Pt able to maintain static sitting balance with  min guard, but unable to lift L LE without posteriorlean/LOB. Balance Overall balance assessment: Needs assistance Sitting-balance support: Bilateral upper extremity supported, Feet supported Sitting balance-Leahy Scale: Poor Sitting balance - Comments: Pt able to maintain static sitting balance with min guard, but unable to lift L LE without posteriorlean/LOB. Standing balance support: Single extremity supported, During functional activity Standing balance-Leahy Scale: Poor Standing balance comment: Trunk sway and unsteadiness noted in standing with R HHA.    Special needs/care consideration Skin Wound: face; Abrasion: face, knee, shoulder, sacrum, penis, Diabetic management novoLOG: 0-9 units 3x daily with meals and Designated visitor Truman Aceituno, daughter     Previous Home Environment (from acute therapy documentation) Living Arrangements: Alone Available Help at Discharge: Family (daughter  lives in [place]) Type of Home: House Home Layout: Two level, Able to live on main level with bedroom/bathroom Alternate Level Stairs-Rails: Right, Left, Can reach both Alternate Level Stairs-Number of Steps: a flight of stairs Home Access: Stairs to enter Entrance Stairs-Rails: None Entrance Stairs-Number of Steps: 2 Bathroom Shower/Tub: Multimedia programmer: Standard Bathroom Accessibility: Yes How Accessible: Accessible via walker  Discharge Living Setting Plans for Discharge Living Setting: House (daughter's house in [place]) where she can provide 24/7 A.  Type of Home at Discharge: House Discharge Home Layout: Two level, 1/2 bath on main level, Other (Comment) (bedroom downstairs) Alternate Level Stairs-Rails: Left Alternate Level Stairs-Number of Steps: 10 Discharge Home Access: Level entry Discharge Bathroom Shower/Tub: Walk-in shower Discharge Bathroom Toilet: Standard Discharge Bathroom Accessibility: Yes How Accessible: Accessible via walker Does the patient have any problems  obtaining your medications?: No  Social/Family/Support Systems Anticipated Caregiver: Shayon Trompeter, daughter Anticipated Caregiver's Contact Information: [phone] Ability/Limitations of Caregiver: works from home Caregiver Availability: 24/7 Discharge Plan Discussed with Primary Caregiver: Yes, with daughter Is Caregiver In Agreement with Plan?: Yes Does Caregiver/Family have Issues with Lodging/Transportation while Pt is in Rehab?: No   Goals Patient/Family Goal for Rehab: PT/OT/ST: Min A Expected length of stay: 16-19 days Pt/Family Agrees to Admission and willing to participate: Yes Program Orientation Provided & Reviewed with Pt/Caregiver Including Roles  & Responsibilities: Yes   Decrease burden of Care through IP rehab admission: NA   Possible need for SNF placement upon discharge:NA   Patient Condition: This patient's medical and functional status has changed since the consult dated: 02/29/20 in which the Rehabilitation Physician determined and documented that the patient's condition is appropriate for intensive rehabilitative care in an inpatient rehabilitation facility. See "History of Present Illness" (above) for medical update. Functional changes are: pt has been assessed by OT with Mod A to Total A needs. Patient's medical and functional status update has been discussed with the Rehabilitation physician and patient remains appropriate for inpatient rehabilitation. Will admit to inpatient rehab today, 03/04/20.  Preadmission Screen Completed By:  Bethel Born, CCC-SLP, 03/01/2020 11:42 AM with day of admit update completed by Raechel Ache, OTR/L on 03/04/20 at 10:55AM ______________________________________________________________________   Discussed status with Dr. Posey Pronto on 03/05/20 at 9:53AM and received approval for admission today.  Admission Coordinator:  Bethel Born, with day of admit updates completed by Raechel Ache time 9:53AM/Date11/10/21

## 2020-03-01 NOTE — Progress Notes (Signed)
Pt hypoglycemic in the 60's, hypoglycemia protocol initiated. Following IV dextrose administration, CBG resolved to 100. Pt stable throughout the episode, continued talking/moaning, yelling, and grabbing at this RN/devices which is unchanged from baseline.

## 2020-03-01 NOTE — Plan of Care (Signed)
  Problem: Nutrition: Goal: Adequate nutrition will be maintained Outcome: Progressing   Problem: Pain Managment: Goal: General experience of comfort will improve Outcome: Progressing   

## 2020-03-01 NOTE — Evaluation (Signed)
Speech Language Pathology Evaluation Patient Details Name: [name] MRN: [id number] DOB: [birth year] Today's Date: 03/01/2020 Time: 0973-5329 SLP Time Calculation (min) (ACUTE ONLY): 17 min  Problem List:  Patient Active Problem List   Diagnosis Date Noted  . CVA (cerebral vascular accident) (HCC) 02/28/2020  . Hypertensive heart disease 05/18/2015  . CAD (coronary artery disease) 05/18/2015  . Type II diabetes mellitus (HCC) 05/18/2015  . Cardiomyopathy, ischemic 05/18/2015  . NSTEMI (non-ST elevated myocardial infarction) (HCC) 05/16/2015  . Hyperlipidemia LDL goal <70 01/30/2010  . Essential hypertension, benign 01/30/2010  . MRSA 05/27/2008  . TOBACCO ABUSE 05/27/2008  . Coronary atherosclerosis 05/27/2008   Past Medical History:  Past Medical History:  Diagnosis Date  . Coronary artery disease    a. 2007 dLAD 100->med rx;  b. 2011 PCI: 80 LAD (Promus 2.75x18); c. 03/2015 MV: predom fixed inf/antapical defefcts w/ ischemia, depressed EF;  c. 04/2015 NSTEMI/PCI: LM nl,LAD 40p ISR, 100d (2.5x20 Promus DES), LCX 95m (3.0x20 Promus DES), RCA 30p, 6m, 70d.   . Diabetes mellitus without complication (HCC)   . Dyslipidemia   . Hx MRSA infection   . Ischemic cardiomyopathy    a. 04/2015 Echo: EF 25-30%, apical AK, Gr 2 DD, triv AI, mild MR, mildly dil LA, PASP .  . Tobacco abuse    Past Surgical History:  Past Surgical History:  Procedure Laterality Date  . CARDIAC CATHETERIZATION N/A 05/16/2015   Procedure: Left Heart Cath and Coronary Angiography;  Surgeon: Tonny Bollman, MD;  Location[facility] INVASIVE CV LAB;  Service: Cardiovascular;  Laterality: N/A;  . CARDIAC CATHETERIZATION N/A 05/16/2015   Procedure: Coronary Stent Intervention;  Surgeon: Tonny Bollman, MD;  Location: [facility] INVASIVE CV LAB;  Service: Cardiovascular;  Laterality: N/A;  . TOTAL SHOULDER REPLACEMENT     Right   HPI:  Pt is a 79 year old male who presented with L-sided weakness, L facial droop, and  confusion after being on the ground at home and appears with abrasion on left side of his face. He did not receive tPA and NIH 14-16. 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 It also showed "Associated petechial hemorrhage at the right basal ganglia without frank hemorrhagic transformation or significant regional mass effect", per MRI report. Medical hx of: CAD, DM, tobaccos use and ischemic cardiomyopathy.   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 Pt is more intelligible in known than unknown context. SLP had to request repetition of pt responses.  Pt is able to repair communication breakdown with repetition, but multiple attempts were sometimes required.  Pt would benefit from speech therapy to address dysarthria.  Pt's receptive and expressive language is relatively strong.  Pt followed 1, 2 and multistep directions with 100% accuracy. Pt answered yes-no questions with 80% accuracy.  Pt completed repetition task with 100% accuracy although dysarthria was noted as above with some word final sounds omitted.  Pt completed confrontational naming task with 90% accuracy with minimal cuing.  Pt appears to have new cognitive deficits.  Pt is not oriented to place, time, or situation.  Pt was unable to retain information with repeated attempts to orient patient to current setting.  Pt demonstrates poor awareness of deficits and appears unsafe. Pt with repeated attempts to get out of bed  in contradiction of SLP and RN instruction. Pt also demonstrates inappropriateness and poor impulse control with inappropriate touching of therapist. Pt is a former [employer] consultant, retired 2012.  Pt would benefit from speech therapy to address cognitive  impairments and would likely benefit from additional formal assessment of cognitive-linguistic competencies.    SLP Assessment  SLP Visit Diagnosis: Dysphagia, oropharyngeal phase (R13.12)    Follow Up Recommendations  Inpatient Rehab    Frequency and Duration   2x week/2weeks        SLP Evaluation Cognition  Overall Cognitive Status: Impaired/Different from baseline Orientation Level: Oriented to person;Disoriented to place;Disoriented to time;Disoriented to situation       Comprehension  Auditory Comprehension Yes/No Questions: Impaired Complex Questions: 75-100% accurate Commands: Within Functional Limits Multistep Basic Commands: 75-100% accurate    Expression Expression Primary Mode of Expression: Verbal Verbal Expression Overall Verbal Expression: Appears within functional limits for tasks assessed Repetition: No impairment Naming: Impairment Confrontation: Impaired (80%)   Oral / Motor  Oral Motor/Sensory Function Overall Oral Motor/Sensory Function: Moderate impairment Facial ROM: Reduced left;Suspected CN VII (facial) dysfunction Facial Symmetry: Abnormal symmetry left;Suspected CN VII (facial) dysfunction Motor Speech Overall Motor Speech: Impaired Respiration: Within functional limits Resonance: Within functional limits Intelligibility: Intelligibility reduced Phrase: 75-100% accurate Sentence: 75-100% accurate   GO                    Ivan Pleasure, MA, CCC-SLP Acute Rehabilitation Services Office: [phone]; Pager (11/6): [phone] 03/01/2020, 2:43 PM

## 2020-03-01 NOTE — Progress Notes (Signed)
ANTICOAGULATION CONSULT NOTE   Pharmacy Consult for Heparin Indication: stroke  No Known Allergies  Patient Measurements: Height: 5\' 11"  (180.3 cm) Weight: 59 kg (130 lb) IBW/kg (Calculated) : 75.3  Heparin dosing weight: 59kg  Vital Signs: Temp: 97.6 F (36.4 C) (11/05 2351) Temp Source: Oral (11/05 2351) BP: 145/74 (11/05 2351) Pulse Rate: 75 (11/05 2351)  Labs: Recent Labs    02/28/20 1123 02/28/20 1123 02/28/20 1409 02/28/20 1409 02/29/20 0206 02/29/20 0957 03/01/20 0057 03/01/20 0311  HGB 12.1*   < > 11.6*   < > 12.3*  --  11.7*  --   HCT 38.1*   < > 34.0*  --  37.5*  --  35.6*  --   PLT 219  --   --   --  204  --  149*  --   APTT 23*  --   --   --   --   --   --   --   LABPROT 13.6  --   --   --   --   --   --   --   INR 1.1  --   --   --   --   --   --   --   HEPARINUNFRC  --   --   --   --  0.11* 0.14*  --  0.33  CREATININE 1.55*   < > 1.40*  --  1.35*  --  1.42*  --   CKTOTAL 745*  --   --   --   --   --  277  --    < > = values in this interval not displayed.    Estimated Creatinine Clearance: 36.4 mL/min (A) (by C-G formula based on SCr of 1.42 mg/dL (H)).  Assessment: CC/HPI: 79 yo m presenting with AMS and left sided weakness - found on floor after 3 days, not moving left side. Of note patient had an ECHO in October that showed persistent LV thrombus and was started on Eliquis, last dose taken 11/2 per med rec.   Pharmacy consulted to dose IV heparin for new subacute stroke.    PMH: ICM, HLD DM CAD  Neuro: 11/4 CTH - subacute right MCA infarct   Heme/Onc: H&H stable, PLT WNL  Initial HL = 0.11, drawn early (1hr after infusion started). F/u level ~8hr later = 0.14, subtherapeutic. Will increase rate cautiously to get to goal range.   11/6 AM update:  Heparin level therapeutic x 1 after rate increase   Goal of Therapy:  Heparin level 0.3 - 0.5 units/ml Monitor platelets by anticoagulation protocol: Yes   Plan:  Cont heparin at 850  units/hr 1000 heparin level  13/6, PharmD, BCPS Clinical Pharmacist Phone: [phone]

## 2020-03-01 NOTE — Consult Note (Signed)
Palliative Medicine  Name: [name] Date: 03/01/2020 MRN: [id number]  DOB: March 26, 1941  Patient Care Team: Deatra Linzie, MD as PCP - General (Family Medicine) Deatra Taite, MD (Family Medicine)    REASON FOR CONSULTATION: [name] is a 79 y.o. 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  Patient had not been seen by neighbors in several days and a wellness check was made by police.  Patient was found down on the floor at home with left-sided facial droop and weakness.  Who was admitted to the hospital on 02/28/2020 with code stroke.  MRI of the brain revealed acute to subacute ischemic right MCA CVA involving the right basal ganglia and overlying right frontal lobe.  Palliative care was consulted to address goals.  SOCIAL HISTORY:     reports that he has quit smoking. He has never used smokeless tobacco. He reports that he does not drink alcohol and does not use drugs.   Patient's been divorced for many years.  He lives at home alone.  He has a daughter in [place].  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  ADVANCE DIRECTIVES:  Does not have  CODE STATUS: Full code  PAST MEDICAL HISTORY: Past Medical History:  Diagnosis Date  . Coronary artery disease    a. 2007 dLAD 100->med rx;  b. 2011 PCI: 80 LAD (Promus 2.75x18); c. 03/2015 MV: predom fixed inf/antapical defefcts w/ ischemia, depressed EF;  c. 04/2015 NSTEMI/PCI: LM nl,LAD 40p ISR, 100d (2.5x20 Promus DES), LCX 61m (3.0x20 Promus DES), RCA 30p, 54m, 70d.   . Diabetes mellitus without complication (HCC)   . Dyslipidemia   . Hx MRSA infection   . Ischemic cardiomyopathy    a. 04/2015 Echo: EF 25-30%, apical AK, Gr 2 DD, triv AI, mild MR, mildly dil LA, PASP .  . Tobacco  abuse     PAST SURGICAL HISTORY:  Past Surgical History:  Procedure Laterality Date  . CARDIAC CATHETERIZATION N/A 05/16/2015   Procedure: Left Heart Cath and Coronary Angiography;  Surgeon: Tonny Bollman, MD;  Location: [facility] INVASIVE CV LAB;  Service: Cardiovascular;  Laterality: N/A;  . CARDIAC CATHETERIZATION N/A 05/16/2015   Procedure: Coronary Stent Intervention;  Surgeon: Tonny Bollman, MD;  Location: [facility] INVAS[facility] CV LAB;  Service: Cardiovascular;  Laterality: N/A;  . TOTAL SHOULDER REPLACEMENT     Right    HEMATOLOGY/ONCOLOGY HISTORY:  Oncology History   No history exists.    ALLERGIES:  has No Known Allergies.  MEDICATIONS:  Current Facility-Administered Medications  Medication Dose Route Frequency Provider Last Rate Last Admin  . acetaminophen (TYLENOL) tablet 650 mg  650 mg Oral Q4H PRN Mikey College T, MD       Or  . acetaminophen (TYLENOL) 160 MG/5ML solution 650 mg  650 mg Per Tube Q4H PRN Mikey College T, MD       Or  . acetaminophen (TYLENOL) suppository 650 mg  650 mg Rectal Q4H PRN Mikey College T, MD      . apixaban Everlene Balls) tablet 5 mg  5 mg Oral BID Lorin Glass, MD   5 mg at 03/01/20 1225  . atorvastatin (LIPITOR) tablet 80 mg  80 mg Oral q1800 Emeline General, MD   80 mg at 02/29/20 2106  . hydrALAZINE (APRESOLINE)  injection 5 mg  5 mg Intravenous Q6H PRN Mikey College T, MD   5 mg at 02/29/20 0105  . insulin aspart (novoLOG) injection 0-9 Units  0-9 Units Subcutaneous TID WC Emeline General, MD   1 Units at 03/01/20 1227  . LORazepam (ATIVAN) injection 0.5 mg  0.5 mg Intravenous Once Mikey College T, MD      . mupirocin cream (BACTROBAN) 2 %   Topical BID Lorin Glass, MD   Given at 03/01/20 1013    VITAL SIGNS: BP 128/84 (BP Location: Left Arm)   Pulse 88   Temp 98.5 F (36.9 C) (Oral)   Resp 18   Ht 5\' 11"  (1.803 m)   Wt 130 lb (59 kg)   SpO2 98%   BMI 18.13 kg/m  Filed Weights   02/28/20 1217  Weight: 130 lb (59 kg)    Estimated body mass index is 18.13  kg/m as calculated from the following:   Height as of this encounter: 5\' 11"  (1.803 m).   Weight as of this encounter: 130 lb (59 kg).  LABS: CBC:    Component Value Date/Time   WBC 9.3 03/01/2020 0057   HGB 11.7 (L) 03/01/2020 0057   HCT 35.6 (L) 03/01/2020 0057   PLT 149 (L) 03/01/2020 0057   MCV 100.3 (H) 03/01/2020 0057   NEUTROABS 6.4 02/28/2020 1123   LYMPHSABS 1.3 02/28/2020 1123   MONOABS 0.8 02/28/2020 1123   EOSABS 0.0 02/28/2020 1123   BASOSABS 0.0 02/28/2020 1123   Comprehensive Metabolic Panel:    Component Value Date/Time   NA 143 03/01/2020 0057   K 3.8 03/01/2020 0057   CL 111 03/01/2020 0057   CO2 17 (L) 03/01/2020 0057   BUN 26 (H) 03/01/2020 0057   CREATININE 1.42 (H) 03/01/2020 0057   GLUCOSE 80 03/01/2020 0057   CALCIUM 8.8 (L) 03/01/2020 0057   AST 36 02/28/2020 1123   ALT 26 02/28/2020 1123   ALKPHOS 84 02/28/2020 1123   BILITOT 0.7 02/28/2020 1123   PROT 6.6 02/28/2020 1123   ALBUMIN 3.7 02/28/2020 1123    RADIOGRAPHIC STUDIES: CT ANGIO HEAD W OR WO CONTRAST  Result Date: 02/29/2020 CLINICAL DATA:  Right MCA infarct. EXAM: CT ANGIOGRAPHY HEAD AND NECK TECHNIQUE: Multidetector CT imaging of the head and neck was performed using the standard protocol during bolus administration of intravenous contrast. Multiplanar CT image reconstructions and MIPs were obtained to evaluate the vascular anatomy. Carotid stenosis measurements (when applicable) are obtained utilizing NASCET criteria, using the distal internal carotid diameter as the denominator. CONTRAST:  75mL OMNIPAQUE IOHEXOL 350 MG/ML SOLN COMPARISON:  Head MRI, head MRA, and neck MRA 02/29/2020 FINDINGS: CT HEAD FINDINGS Brain: A moderate-sized acute to subacute right MCA infarct is again noted with extensive involvement of the basal ganglia. Associated petechial hemorrhage is better demonstrated on MRI. There is no malignant hemorrhagic transformation. Right basal ganglia cytotoxic edema results in  partial effacement of the frontal horn of the right lateral ventricle. There is no midline shift. No extra-axial fluid collection is identified. Hypodensities elsewhere in the cerebral white matter bilaterally are nonspecific but compatible with mild chronic small vessel ischemic disease. Chronic cerebellar infarcts are again noted. Vascular: Calcified atherosclerosis at the skull base. Skull: No fracture or suspicious osseous lesion. Sinuses: Paranasal sinuses and mastoid air cells are clear. Orbits: Unremarkable. Review of the MIP images confirms the above findings CTA NECK FINDINGS Aortic arch: Standard 3 vessel aortic arch with mild atherosclerotic plaque. Less than 50%  narrowing of the proximal left subclavian artery due to soft plaque. Right carotid system: Patent with mild scattered atheromatous wall thickening and minimal calcified plaque at the carotid bifurcation. No evidence of dissection or stenosis. Left carotid system: Patent with mild scattered atheromatous wall thickening. No evidence of dissection or stenosis. Vertebral arteries: Patent without evidence of dissection or flow-limiting stenosis. Mild segmental narrowing of the right V3 segment at the C1-2 level. Skeleton: Advanced bilateral C1-2 arthropathy and upper cervical facet arthrosis. Moderate to severe mid to lower cervical disc degeneration. Other neck: No evidence of cervical lymphadenopathy or mass. Upper chest: Clear lung apices. Review of the MIP images confirms the above findings CTA HEAD FINDINGS Anterior circulation: The internal carotid arteries are patent from skull base to carotid termini with atherosclerotic plaque resulting in irregularity of the cavernous and proximal supraclinoid segments without significant associated stenosis. ACAs and MCAs are patent without evidence of a significant A1 or left M1 stenosis. There is a severe 5 mm long stenosis of the proximal right M1 segment with diffuse attenuation of the right MCA more  distally. No aneurysm is identified. Posterior circulation: The intracranial vertebral arteries are widely patent to the basilar. Patent PICA and SCA origins are seen bilaterally. The basilar artery is widely patent. There are right larger than left posterior communicating arteries. There are mild right P1, moderate proximal right P3, and moderate to severe mid left P2 stenoses. No aneurysm is identified. Venous sinuses: As permitted by contrast timing, patent. Anatomic variants: None. Review of the MIP images confirms the above findings IMPRESSION: 1. Intracranial atherosclerosis including a severe proximal right M1 stenosis with diffuse attenuation of the right MCA more distally. 2. Moderate to severe left P2 stenosis. 3. Mild cervical carotid atherosclerosis without stenosis. 4. Mild right V3 segment stenosis. 5. Known right MCA infarct. 6. Aortic Atherosclerosis (ICD10-I70.0). Electronically Signed   By: Sebastian Ache M.D.   On: 02/29/2020 11:19   DG Chest 2 View  Result Date: 02/28/2020 CLINICAL DATA:  CVA EXAM: CHEST - 2 VIEW COMPARISON:  02/05/2020 FINDINGS: Prior median sternotomy. Heart and mediastinal contours are within normal limits. No focal opacities or effusions. No acute bony abnormality. IMPRESSION: No active cardiopulmonary disease. Electronically Signed   By: Charlett Nose M.D.   On: 02/28/2020 19:06   CT ANGIO NECK W OR WO CONTRAST  Result Date: 02/29/2020 CLINICAL DATA:  Right MCA infarct. EXAM: CT ANGIOGRAPHY HEAD AND NECK TECHNIQUE: Multidetector CT imaging of the head and neck was performed using the standard protocol during bolus administration of intravenous contrast. Multiplanar CT image reconstructions and MIPs were obtained to evaluate the vascular anatomy. Carotid stenosis measurements (when applicable) are obtained utilizing NASCET criteria, using the distal internal carotid diameter as the denominator. CONTRAST:  45mL OMNIPAQUE IOHEXOL 350 MG/ML SOLN COMPARISON:  Head MRI, head  MRA, and neck MRA 02/29/2020 FINDINGS: CT HEAD FINDINGS Brain: A moderate-sized acute to subacute right MCA infarct is again noted with extensive involvement of the basal ganglia. Associated petechial hemorrhage is better demonstrated on MRI. There is no malignant hemorrhagic transformation. Right basal ganglia cytotoxic edema results in partial effacement of the frontal horn of the right lateral ventricle. There is no midline shift. No extra-axial fluid collection is identified. Hypodensities elsewhere in the cerebral white matter bilaterally are nonspecific but compatible with mild chronic small vessel ischemic disease. Chronic cerebellar infarcts are again noted. Vascular: Calcified atherosclerosis at the skull base. Skull: No fracture or suspicious osseous lesion. Sinuses: Paranasal sinuses and mastoid  air cells are clear. Orbits: Unremarkable. Review of the MIP images confirms the above findings CTA NECK FINDINGS Aortic arch: Standard 3 vessel aortic arch with mild atherosclerotic plaque. Less than 50% narrowing of the proximal left subclavian artery due to soft plaque. Right carotid system: Patent with mild scattered atheromatous wall thickening and minimal calcified plaque at the carotid bifurcation. No evidence of dissection or stenosis. Left carotid system: Patent with mild scattered atheromatous wall thickening. No evidence of dissection or stenosis. Vertebral arteries: Patent without evidence of dissection or flow-limiting stenosis. Mild segmental narrowing of the right V3 segment at the C1-2 level. Skeleton: Advanced bilateral C1-2 arthropathy and upper cervical facet arthrosis. Moderate to severe mid to lower cervical disc degeneration. Other neck: No evidence of cervical lymphadenopathy or mass. Upper chest: Clear lung apices. Review of the MIP images confirms the above findings CTA HEAD FINDINGS Anterior circulation: The internal carotid arteries are patent from skull base to carotid termini with  atherosclerotic plaque resulting in irregularity of the cavernous and proximal supraclinoid segments without significant associated stenosis. ACAs and MCAs are patent without evidence of a significant A1 or left M1 stenosis. There is a severe 5 mm long stenosis of the proximal right M1 segment with diffuse attenuation of the right MCA more distally. No aneurysm is identified. Posterior circulation: The intracranial vertebral arteries are widely patent to the basilar. Patent PICA and SCA origins are seen bilaterally. The basilar artery is widely patent. There are right larger than left posterior communicating arteries. There are mild right P1, moderate proximal right P3, and moderate to severe mid left P2 stenoses. No aneurysm is identified. Venous sinuses: As permitted by contrast timing, patent. Anatomic variants: None. Review of the MIP images confirms the above findings IMPRESSION: 1. Intracranial atherosclerosis including a severe proximal right M1 stenosis with diffuse attenuation of the right MCA more distally. 2. Moderate to severe left P2 stenosis. 3. Mild cervical carotid atherosclerosis without stenosis. 4. Mild right V3 segment stenosis. 5. Known right MCA infarct. 6. Aortic Atherosclerosis (ICD10-I70.0). Electronically Signed   By: Sebastian Ache M.D.   On: 02/29/2020 11:19   CT Cervical Spine Wo Contrast  Result Date: 02/28/2020 CLINICAL DATA:  Neck trauma, weakness. EXAM: CT CERVICAL SPINE WITHOUT CONTRAST TECHNIQUE: Multidetector CT imaging of the cervical spine was performed without intravenous contrast. Multiplanar CT image reconstructions were also generated. COMPARISON:  Same day head and maxillofacial CTs. FINDINGS: Alignment: Straightening of lordosis with slight reversal. Grade 1 C2-3 and C3-4 anterolisthesis. Minimal grade 1 C5-6 retrolisthesis. Skull base and vertebrae: Vertebral body heights are preserved. Multilevel degenerative changes including endplate sclerosis, osteophytosis and  Schmorl's node formation. Fusion of the bilateral C2-3 facet joints. Soft tissues and spinal canal: No prevertebral fluid or swelling. No visible canal hematoma. Disc levels: Patent bony spinal canal. Multilevel mild bony neural foraminal narrowing. Upper chest: Left apical scarring. Other: None. IMPRESSION: No acute fracture or traumatic listhesis. Multilevel spondylosis. Electronically Signed   By: Stana Bunting M.D.   On: 02/28/2020 14:47   MR ANGIO HEAD WO CONTRAST  Result Date: 02/29/2020 CLINICAL DATA:  Follow-up examination for acute stroke. EXAM: MRI HEAD WITHOUT CONTRAST MRA HEAD WITHOUT CONTRAST MRA NECK WITHOUT CONTRAST TECHNIQUE: Multiplanar, multiecho pulse sequences of the brain and surrounding structures were obtained without intravenous contrast. Angiographic images of the Circle of Willis were obtained using MRA technique without intravenous contrast. Angiographic images of the neck were obtained using MRA technique without intravenous contrast. Carotid stenosis measurements (when applicable) are obtained  utilizing NASCET criteria, using the distal internal carotid diameter as the denominator. COMPARISON:  Prior CT from 02/28/2020. FINDINGS: MRI HEAD FINDINGS Brain: Generalized age-related cerebral atrophy. Patchy T2/FLAIR hyperintensity within the periventricular and deep white matter both cerebral hemispheres most consistent with chronic small vessel ischemic disease, mild to moderate in nature. Multiple scattered remote cerebellar infarcts noted, right greater than left. Associated scattered foci of chronic hemosiderin staining. Confluent diffusion abnormality involving the right basal ganglia consistent with an acute to subacute right MCA territory infarct. Associated susceptibility artifact compatible with petechial hemorrhage without frank hemorrhagic transformation. Additional patchy cortical and subcortical infarcts noted within the adjacent right frontal operculum. Findings  correspond with abnormality on prior CT. Mild localized swelling at the right basal ganglia with partial effacement of the right lateral ventricle without significant midline shift or regional mass effect. No mass lesion. No hydrocephalus or extra-axial fluid collection. Pituitary gland and suprasellar region normal. Vascular: Major intracranial vascular flow voids are maintained. Skull and upper cervical spine: Advanced degenerative changes noted about the dens and C1-2 articulation with degenerative thickening at the tectorial membrane. Resultant mild spinal stenosis at the cervicomedullary junction without frank cord impingement. Bone marrow signal intensity within normal limits. No scalp soft tissue abnormality. Sinuses/Orbits: Globes and orbital soft tissues within normal limits. Paranasal sinuses are largely clear. No mastoid effusion. Inner ear structures grossly normal. Other: None. MRA HEAD FINDINGS ANTERIOR CIRCULATION: Visualized distal cervical segments of the internal carotid arteries are patent with antegrade flow. Petrous, cavernous, and supraclinoid segments patent without stenosis or other abnormality. A1 segments patent bilaterally. Normal anterior communicating artery complex. Anterior cerebral arteries patent to their distal aspects without stenosis. Left M1 widely patent. Normal left MCA bifurcation. Distal left MCA branches well perfused. On the right, there is focal attenuation of the proximal right M1 segment, measuring 5 mm in length (series 1040, image 10). Finding could reflect a severe high-grade stenosis or possibly partially occlusive and/or recanalized thrombus. No definite corresponding FLAIR signal abnormality seen at this level on corresponding brain MRI. Right M1 and distal MCA branches are patent distally but are markedly attenuated. No visible proximal M2 occlusion. POSTERIOR CIRCULATION: Vertebral arteries patent to the vertebrobasilar junction without stenosis. Right PICA  patent. Left PICA not definitely seen. Basilar patent to its distal aspect without stenosis. Superior cerebral arteries patent bilaterally. Left PCA primarily supplied via the basilar. Right PCA supplied via a hypoplastic right P1 segment and robust right posterior communicating artery. Right PCA widely patent to its distal aspect. Moderate stenosis involving the mid left P2 segment noted (series 1034, image 16). Left PCA otherwise patent distally. No aneurysm. MRA NECK FINDINGS Examination severely limited as the patient was unable to tolerate the full length of the exam, and terminated the study early. Partially visualized aortic arch grossly within normal limits for caliber. No obvious stenosis about the origin of the great vessels. Both carotid artery systems appear grossly patent within the neck. Vertebral arteries appear grossly patent as well. Evaluation for possible flow-limiting stenosis or other vascular pathology severely limited. IMPRESSION: MRI HEAD IMPRESSION: 1. 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 Associated petechial hemorrhage at the right basal ganglia without frank hemorrhagic transformation or significant regional mass effect. 2. Underlying age-related cerebral atrophy with chronic small vessel ischemic disease, with multiple scattered remote cerebellar infarcts, right greater than left. MRA HEAD IMPRESSION: 1. Severe high-grade stenosis or possibly partially occlusive and/or recanalized thrombus within the  proximal right M1 segment. Patent but attenuated flow distally within the right MCA distribution. Follow-up examination with dedicated CTA could be performed for further evaluation as clinically warranted. 2. Moderate left P2 stenosis. Otherwise wide patency of the major intracranial arterial vasculature. MRA NECK IMPRESSION: 1. Severely limited study due to the patient's inability to tolerate the full  length of the exam, which was terminated early. 2. Gross patency of the major arterial vasculature of the neck. Examination is otherwise nondiagnostic in evaluation for stenosis or other potential acute vascular pathology. Electronically Signed   By: Rise Mu M.D.   On: 02/29/2020 02:03   MR ANGIO NECK WO CONTRAST  Result Date: 02/29/2020 CLINICAL DATA:  Follow-up examination for acute stroke. EXAM: MRI HEAD WITHOUT CONTRAST MRA HEAD WITHOUT CONTRAST MRA NECK WITHOUT CONTRAST TECHNIQUE: Multiplanar, multiecho pulse sequences of the brain and surrounding structures were obtained without intravenous contrast. Angiographic images of the Circle of Willis were obtained using MRA technique without intravenous contrast. Angiographic images of the neck were obtained using MRA technique without intravenous contrast. Carotid stenosis measurements (when applicable) are obtained utilizing NASCET criteria, using the distal internal carotid diameter as the denominator. COMPARISON:  Prior CT from 02/28/2020. FINDINGS: MRI HEAD FINDINGS Brain: Generalized age-related cerebral atrophy. Patchy T2/FLAIR hyperintensity within the periventricular and deep white matter both cerebral hemispheres most consistent with chronic small vessel ischemic disease, mild to moderate in nature. Multiple scattered remote cerebellar infarcts noted, right greater than left. Associated scattered foci of chronic hemosiderin staining. Confluent diffusion abnormality involving the right basal ganglia consistent with an acute to subacute right MCA territory infarct. Associated susceptibility artifact compatible with petechial hemorrhage without frank hemorrhagic transformation. Additional patchy cortical and subcortical infarcts noted within the adjacent right frontal operculum. Findings correspond with abnormality on prior CT. Mild localized swelling at the right basal ganglia with partial effacement of the right lateral ventricle without  significant midline shift or regional mass effect. No mass lesion. No hydrocephalus or extra-axial fluid collection. Pituitary gland and suprasellar region normal. Vascular: Major intracranial vascular flow voids are maintained. Skull and upper cervical spine: Advanced degenerative changes noted about the dens and C1-2 articulation with degenerative thickening at the tectorial membrane. Resultant mild spinal stenosis at the cervicomedullary junction without frank cord impingement. Bone marrow signal intensity within normal limits. No scalp soft tissue abnormality. Sinuses/Orbits: Globes and orbital soft tissues within normal limits. Paranasal sinuses are largely clear. No mastoid effusion. Inner ear structures grossly normal. Other: None. MRA HEAD FINDINGS ANTERIOR CIRCULATION: Visualized distal cervical segments of the internal carotid arteries are patent with antegrade flow. Petrous, cavernous, and supraclinoid segments patent without stenosis or other abnormality. A1 segments patent bilaterally. Normal anterior communicating artery complex. Anterior cerebral arteries patent to their distal aspects without stenosis. Left M1 widely patent. Normal left MCA bifurcation. Distal left MCA branches well perfused. On the right, there is focal attenuation of the proximal right M1 segment, measuring 5 mm in length (series 1040, image 10). Finding could reflect a severe high-grade stenosis or possibly partially occlusive and/or recanalized thrombus. No definite corresponding FLAIR signal abnormality seen at this level on corresponding brain MRI. Right M1 and distal MCA branches are patent distally but are markedly attenuated. No visible proximal M2 occlusion. POSTERIOR CIRCULATION: Vertebral arteries patent to the vertebrobasilar junction without stenosis. Right PICA patent. Left PICA not definitely seen. Basilar patent to its distal aspect without stenosis. Superior cerebral arteries patent bilaterally. Left PCA primarily  supplied via the basilar. Right PCA supplied  via a hypoplastic right P1 segment and robust right posterior communicating artery. Right PCA widely patent to its distal aspect. Moderate stenosis involving the mid left P2 segment noted (series 1034, image 16). Left PCA otherwise patent distally. No aneurysm. MRA NECK FINDINGS Examination severely limited as the patient was unable to tolerate the full length of the exam, and terminated the study early. Partially visualized aortic arch grossly within normal limits for caliber. No obvious stenosis about the origin of the great vessels. Both carotid artery systems appear grossly patent within the neck. Vertebral arteries appear grossly patent as well. Evaluation for possible flow-limiting stenosis or other vascular pathology severely limited. IMPRESSION: MRI HEAD IMPRESSION: 1. 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 Associated petechial hemorrhage at the right basal ganglia without frank hemorrhagic transformation or significant regional mass effect. 2. Underlying age-related cerebral atrophy with chronic small vessel ischemic disease, with multiple scattered remote cerebellar infarcts, right greater than left. MRA HEAD IMPRESSION: 1. Severe high-grade stenosis or possibly partially occlusive and/or recanalized thrombus within the proximal right M1 segment. Patent but attenuated flow distally within the right MCA distribution. Follow-up examination with dedicated CTA could be performed for further evaluation as clinically warranted. 2. Moderate left P2 stenosis. Otherwise wide patency of the major intracranial arterial vasculature. MRA NECK IMPRESSION: 1. Severely limited study due to the patient's inability to tolerate the full length of the exam, which was terminated early. 2. Gross patency of the major arterial vasculature of the neck. Examination is otherwise nondiagnostic in evaluation  for stenosis or other potential acute vascular pathology. Electronically Signed   By: Rise Mu M.D.   On: 02/29/2020 02:03   MR BRAIN WO CONTRAST  Result Date: 02/29/2020 CLINICAL DATA:  Follow-up examination for acute stroke. EXAM: MRI HEAD WITHOUT CONTRAST MRA HEAD WITHOUT CONTRAST MRA NECK WITHOUT CONTRAST TECHNIQUE: Multiplanar, multiecho pulse sequences of the brain and surrounding structures were obtained without intravenous contrast. Angiographic images of the Circle of Willis were obtained using MRA technique without intravenous contrast. Angiographic images of the neck were obtained using MRA technique without intravenous contrast. Carotid stenosis measurements (when applicable) are obtained utilizing NASCET criteria, using the distal internal carotid diameter as the denominator. COMPARISON:  Prior CT from 02/28/2020. FINDINGS: MRI HEAD FINDINGS Brain: Generalized age-related cerebral atrophy. Patchy T2/FLAIR hyperintensity within the periventricular and deep white matter both cerebral hemispheres most consistent with chronic small vessel ischemic disease, mild to moderate in nature. Multiple scattered remote cerebellar infarcts noted, right greater than left. Associated scattered foci of chronic hemosiderin staining. Confluent diffusion abnormality involving the right basal ganglia consistent with an acute to subacute right MCA territory infarct. Associated susceptibility artifact compatible with petechial hemorrhage without frank hemorrhagic transformation. Additional patchy cortical and subcortical infarcts noted within the adjacent right frontal operculum. Findings correspond with abnormality on prior CT. Mild localized swelling at the right basal ganglia with partial effacement of the right lateral ventricle without significant midline shift or regional mass effect. No mass lesion. No hydrocephalus or extra-axial fluid collection. Pituitary gland and suprasellar region normal. Vascular:  Major intracranial vascular flow voids are maintained. Skull and upper cervical spine: Advanced degenerative changes noted about the dens and C1-2 articulation with degenerative thickening at the tectorial membrane. Resultant mild spinal stenosis at the cervicomedullary junction without frank cord impingement. Bone marrow signal intensity within normal limits. No scalp soft tissue abnormality. Sinuses/Orbits: Globes and orbital soft tissues within normal limits. Paranasal  sinuses are largely clear. No mastoid effusion. Inner ear structures grossly normal. Other: None. MRA HEAD FINDINGS ANTERIOR CIRCULATION: Visualized distal cervical segments of the internal carotid arteries are patent with antegrade flow. Petrous, cavernous, and supraclinoid segments patent without stenosis or other abnormality. A1 segments patent bilaterally. Normal anterior communicating artery complex. Anterior cerebral arteries patent to their distal aspects without stenosis. Left M1 widely patent. Normal left MCA bifurcation. Distal left MCA branches well perfused. On the right, there is focal attenuation of the proximal right M1 segment, measuring 5 mm in length (series 1040, image 10). Finding could reflect a severe high-grade stenosis or possibly partially occlusive and/or recanalized thrombus. No definite corresponding FLAIR signal abnormality seen at this level on corresponding brain MRI. Right M1 and distal MCA branches are patent distally but are markedly attenuated. No visible proximal M2 occlusion. POSTERIOR CIRCULATION: Vertebral arteries patent to the vertebrobasilar junction without stenosis. Right PICA patent. Left PICA not definitely seen. Basilar patent to its distal aspect without stenosis. Superior cerebral arteries patent bilaterally. Left PCA primarily supplied via the basilar. Right PCA supplied via a hypoplastic right P1 segment and robust right posterior communicating artery. Right PCA widely patent to its distal aspect.  Moderate stenosis involving the mid left P2 segment noted (series 1034, image 16). Left PCA otherwise patent distally. No aneurysm. MRA NECK FINDINGS Examination severely limited as the patient was unable to tolerate the full length of the exam, and terminated the study early. Partially visualized aortic arch grossly within normal limits for caliber. No obvious stenosis about the origin of the great vessels. Both carotid artery systems appear grossly patent within the neck. Vertebral arteries appear grossly patent as well. Evaluation for possible flow-limiting stenosis or other vascular pathology severely limited. IMPRESSION: MRI HEAD IMPRESSION: 1. 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 Associated petechial hemorrhage at the right basal ganglia without frank hemorrhagic transformation or significant regional mass effect. 2. Underlying age-related cerebral atrophy with chronic small vessel ischemic disease, with multiple scattered remote cerebellar infarcts, right greater than left. MRA HEAD IMPRESSION: 1. Severe high-grade stenosis or possibly partially occlusive and/or recanalized thrombus within the proximal right M1 segment. Patent but attenuated flow distally within the right MCA distribution. Follow-up examination with dedicated CTA could be performed for further evaluation as clinically warranted. 2. Moderate left P2 stenosis. Otherwise wide patency of the major intracranial arterial vasculature. MRA NECK IMPRESSION: 1. Severely limited study due to the patient's inability to tolerate the full length of the exam, which was terminated early. 2. Gross patency of the major arterial vasculature of the neck. Examination is otherwise nondiagnostic in evaluation for stenosis or other potential acute vascular pathology. Electronically Signed   By: Rise Mu M.D.   On: 02/29/2020 02:03   DG Shoulder Left  Result Date:  02/28/2020 CLINICAL DATA:  Fall.  Abrasion to left shoulder. EXAM: LEFT SHOULDER - 2+ VIEW COMPARISON:  None FINDINGS: There is normal bony alignment. No evidence of acute osseous or articular abnormality. Degenerative changes of the acromioclavicular joint. IMPRESSION: No evidence of acute osseous or articular abnormality. Electronically Signed   By: Jackey Loge DO   On: 02/28/2020 13:06   DG Knee Complete 4 Views Left  Result Date: 02/28/2020 CLINICAL DATA:  Fall. EXAM: LEFT KNEE - COMPLETE 4+ VIEW COMPARISON:  No pertinent prior exams are available for comparison. FINDINGS: There is normal bony alignment. No evidence of acute osseous or articular abnormality.  Tricompartmental osteoarthritic changes. Atherosclerotic vascular calcifications. IMPRESSION: No evidence of acute osseous or articular abnormality. Electronically Signed   By: Jackey Loge DO   On: 02/28/2020 13:08   DG Knee Complete 4 Views Right  Result Date: 02/28/2020 CLINICAL DATA:  Fall EXAM: RIGHT KNEE - COMPLETE 4+ VIEW COMPARISON:  None. FINDINGS: Alignment is anatomic. There is no acute fracture. Small joint effusion. Tricompartmental changes of osteoarthritis. Vascular calcifications. IMPRESSION: No acute fracture. Small joint effusion. Tricompartmental osteoarthritis. Electronically Signed   By: Guadlupe Spanish M.D.   On: 02/28/2020 13:08   DG Abd 2 Views  Result Date: 02/28/2020 CLINICAL DATA:  Recent fall with abdominal pain, initial encounter EXAM: ABDOMEN - 2 VIEW COMPARISON:  None. FINDINGS: Scattered large and small bowel gas is noted. No acute bony abnormality is seen. No free air is noted. No mass lesion is noted. IMPRESSION: No acute abnormality noted. Electronically Signed   By: Alcide Clever M.D.   On: 02/28/2020 21:12   DG Swallowing Func-Speech Pathology  Result Date: 02/29/2020 Objective Swallowing Evaluation: Type of Study: MBS-Modified Barium Swallow Study  Patient Details Name: HYMAN CROSSAN MRN: [id number] Date of  Birth: Jul 02, 1940 Today's Date: 02/29/2020 Time: SLP Start Time (ACUTE ONLY): 1220 -SLP Stop Time (ACUTE ONLY): 1240 SLP Time Calculation (min) (ACUTE ONLY): 20 min Past Medical History: Past Medical History: Diagnosis Date . Coronary artery disease   a. 2007 dLAD 100->med rx;  b. 2011 PCI: 80 LAD (Promus 2.75x18); c. 03/2015 MV: predom fixed inf/antapical defefcts w/ ischemia, depressed EF;  c. 04/2015 NSTEMI/PCI: LM nl,LAD 40p ISR, 100d (2.5x20 Promus DES), LCX 49m (3.0x20 Promus DES), RCA 30p, 40m, 70d.  . Diabetes mellitus without complication (HCC)  . Dyslipidemia  . Hx MRSA infection  . Ischemic cardiomyopathy   a. 04/2015 Echo: EF 25-30%, apical AK, Gr 2 DD, triv AI, mild MR, mildly dil LA, PASP . . Tobacco abuse  Past Surgical History: Past Surgical History: Procedure Laterality Date . CARDIAC CATHETERIZATION N/A 05/16/2015  Procedure: Left Heart Cath and Coronary Angiography;  Surgeon: Tonny Bollman, MD;  Location: [facility] INVASIVE CV LAB;  Service: Cardiovascular;  Laterality: N/A; . CARDIAC CATHETERIZATION N/A 05/16/2015  Procedure: Coronary Stent Intervention;  Surgeon: Tonny Bollman, MD;  Location: [facility] INVASIVE CV LAB;  Service: Cardiovascular;  Laterality: N/A; . TOTAL SHOULDER REPLACEMENT    Right HPI: Pt is a 79 year old male who presented with L-sided weakness, L facial droop, and confusion after being on the ground at home and appears with abrasion on left side of his face. He did not receive tPA and NIH 14-16. 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 It also showed "Associated petechial hemorrhage at the right basal ganglia without frank hemorrhagic transformation or significant regional mass effect", per MRI report. Medical hx of: CAD, DM, tobaccos use and ischemic cardiomyopathy.  Pt with agitation over night but per chart review, appeared with improved mentation/improving left sided strength.  Pt noted to overt cough with consumption  of coffee overnight and was made NPO with swallow evaluation ordered.  Pt states he has a h/o a stroke with left sided weakness, chart review yielded findings of TIA.  Subjective: alert Assessment / Plan / Recommendation CHL IP CLINICAL IMPRESSIONS 02/29/2020 Clinical Impression Pt presents with a mild oropharyngeal dysphagia marked primarily by reduced sensorimotor function within the oral phase.  There is reduced labial seal, leading to leaking of liquid boluses from the  left side of mouth; disorganized bolus formation and lingual pumping, retention in the left oral cavity post-swallow (requiring oral suctioning at the end of the study).  Thin liquids spilled into the laryngeal vestibule and were aspirated before and during the swallow. Pt had an explosive cough in reaction to aspiration.  Nectar thick liquids were swallowed safely with no penetration nor aspiration over repeated trials. There was little to no pharyngeal residue post -swallow.  For now, recommend beginning a dysphagia 1 diet with nectar thick liquids.  Pt may have small sips of water outside meals and after oral care.  Crush meds for now.  SLP will follow and can advance diet based on clinical observations without repeating MBS.  SLP Visit Diagnosis Dysphagia, oropharyngeal phase (R13.12) Attention and concentration deficit following -- Frontal lobe and executive function deficit following -- Impact on safety and function Mild aspiration risk   CHL IP TREATMENT RECOMMENDATION 02/29/2020 Treatment Recommendations Therapy as outlined in treatment plan below   No flowsheet data found. CHL IP DIET RECOMMENDATION 02/29/2020 SLP Diet Recommendations Dysphagia 1 (Puree) solids;Nectar thick liquid Liquid Administration via Cup;Straw Medication Administration Crushed with puree Compensations -- Postural Changes --   CHL IP OTHER RECOMMENDATIONS 02/29/2020 Recommended Consults -- Oral Care Recommendations Oral care BID Other Recommendations Order thickener from  pharmacy   CHL IP FOLLOW UP RECOMMENDATIONS 02/29/2020 Follow up Recommendations Inpatient Rehab   CHL IP FREQUENCY AND DURATION 02/29/2020 Speech Therapy Frequency (ACUTE ONLY) min 2x/week Treatment Duration 2 weeks      CHL IP ORAL PHASE 02/29/2020 Oral Phase Impaired Oral - Pudding Teaspoon -- Oral - Pudding Cup -- Oral - Honey Teaspoon -- Oral - Honey Cup -- Oral - Nectar Teaspoon -- Oral - Nectar Cup -- Oral - Nectar Straw Left anterior bolus loss;Weak lingual manipulation;Lingual pumping;Reduced posterior propulsion;Decreased bolus cohesion Oral - Thin Teaspoon -- Oral - Thin Cup -- Oral - Thin Straw Left anterior bolus loss;Weak lingual manipulation;Lingual pumping;Reduced posterior propulsion;Decreased bolus cohesion Oral - Puree Weak lingual manipulation;Lingual pumping;Reduced posterior propulsion;Decreased bolus cohesion Oral - Mech Soft -- Oral - Regular -- Oral - Multi-Consistency -- Oral - Pill -- Oral Phase - Comment --  CHL IP PHARYNGEAL PHASE 02/29/2020 Pharyngeal Phase Impaired Pharyngeal- Pudding Teaspoon -- Pharyngeal -- Pharyngeal- Pudding Cup -- Pharyngeal -- Pharyngeal- Honey Teaspoon -- Pharyngeal -- Pharyngeal- Honey Cup -- Pharyngeal -- Pharyngeal- Nectar Teaspoon -- Pharyngeal -- Pharyngeal- Nectar Cup -- Pharyngeal -- Pharyngeal- Nectar Straw Delayed swallow initiation-pyriform sinuses;Reduced laryngeal elevation Pharyngeal -- Pharyngeal- Thin Teaspoon -- Pharyngeal -- Pharyngeal- Thin Cup -- Pharyngeal -- Pharyngeal- Thin Straw Delayed swallow initiation-pyriform sinuses;Reduced airway/laryngeal closure;Penetration/Aspiration before swallow;Penetration/Aspiration during swallow;Trace aspiration;Reduced laryngeal elevation Pharyngeal Material enters airway, passes BELOW cords and not ejected out despite cough attempt by patient Pharyngeal- Puree Delayed swallow initiation-vallecula;Reduced laryngeal elevation Pharyngeal -- Pharyngeal- Mechanical Soft -- Pharyngeal -- Pharyngeal- Regular --  Pharyngeal -- Pharyngeal- Multi-consistency -- Pharyngeal -- Pharyngeal- Pill -- Pharyngeal -- Pharyngeal Comment --  No flowsheet data found. Blenda Mounts Laurice 02/29/2020, 3:11 PM              ECHOCARDIOGRAM COMPLETE  Result Date: 02/29/2020    ECHOCARDIOGRAM REPORT   Patient Name:   ZAEL SHUMAN Date of Exam: 02/29/2020 Medical Rec #:  [id number]     Height:       71.0 in Accession #:    [id number]    Weight:       130.0 lb Date of Birth:  10/24/1940  BSA:          1.756 m Patient Age:    79 years      BP:           166/78 mmHg Patient Gender: M             HR:           52 bpm. Exam Location:  Inpatient Procedure: 2D Echo, Cardiac Doppler and Color Doppler Indications:    TIA 435.9 / G45.9  History:        Patient has prior history of Echocardiogram examinations, most                 recent 09/08/2015. Cardiomyopathy, CAD; Risk Factors:Current                 Smoker, Dyslipidemia and Diabetes.  Sonographer:    Eulah Pont RDCS Referring Phys: [id number] SRISHTI L BHAGAT IMPRESSIONS  1. LV systolic function is mildly decreased with akinesis at the distal lateral, apical, basal inferior/inferolateral walls. Cannot exclude thrombus at LV apex Would recomm ordering limited echo with Definity to furhter evaluate. . Left ventricular ejection fraction, by estimation, is 45 to 50%. The left ventricle has mildly decreased function. The left ventricle demonstrates regional wall motion abnormalities (see scoring diagram/findings for description). Left ventricular diastolic parameters are  consistent with Grade I diastolic dysfunction (impaired relaxation).  2. Right ventricular systolic function is normal. The right ventricular size is normal.  3. The mitral valve is normal in structure. No evidence of mitral valve regurgitation.  4. The aortic valve is tricuspid. Aortic valve regurgitation is not visualized. Mild aortic valve sclerosis is present, with no evidence of aortic valve stenosis.  5. The inferior vena  cava is normal in size with greater than 50% respiratory variability, suggesting right atrial pressure of 3 mmHg. FINDINGS  Left Ventricle: LV systolic function is mildly decreased with akinesis at the distal lateral, apical, basal inferior/inferolateral walls. Cannot exclude thrombus at LV apex Would recomm ordering limited echo with Definity to furhter evaluate. Left ventricular ejection fraction, by estimation, is 45 to 50%. The left ventricle has mildly decreased function. The left ventricle demonstrates regional wall motion abnormalities. The left ventricular internal cavity size was normal in size. There is no left ventricular hypertrophy. Left ventricular diastolic parameters are consistent with Grade I diastolic dysfunction (impaired relaxation). Right Ventricle: The right ventricular size is normal. No increase in right ventricular wall thickness. Right ventricular systolic function is normal. Left Atrium: Left atrial size was normal in size. Right Atrium: Right atrial size was normal in size. Pericardium: There is no evidence of pericardial effusion. Mitral Valve: The mitral valve is normal in structure. No evidence of mitral valve regurgitation. Tricuspid Valve: The tricuspid valve is normal in structure. Tricuspid valve regurgitation is mild. Aortic Valve: The aortic valve is tricuspid. Aortic valve regurgitation is not visualized. Mild aortic valve sclerosis is present, with no evidence of aortic valve stenosis. Pulmonic Valve: The pulmonic valve was normal in structure. Pulmonic valve regurgitation is not visualized. Aorta: The aortic root is normal in size and structure. Venous: The inferior vena cava is normal in size with greater than 50% respiratory variability, suggesting right atrial pressure of 3 mmHg. IAS/Shunts: No atrial level shunt detected by color flow Doppler.  LEFT VENTRICLE PLAX 2D LVIDd:         4.20 cm  Diastology LVIDs:         3.20 cm  LV e' medial:  3.08 cm/s LV PW:         0.90  cm  LV E/e' medial:  16.4 LV IVS:        0.90 cm  LV e' lateral:   6.24 cm/s LVOT diam:     2.20 cm  LV E/e' lateral: 8.1 LV SV:         78 LV SV Index:   44 LVOT Area:     3.80 cm  RIGHT VENTRICLE RV S prime:     8.70 cm/s TAPSE (M-mode): 1.6 cm LEFT ATRIUM             Index       RIGHT ATRIUM           Index LA diam:        2.20 cm 1.25 cm/m  RA Area:     13.80 cm LA Vol (A2C):   44.3 ml 25.23 ml/m RA Volume:   32.00 ml  18.23 ml/m LA Vol (A4C):   44.9 ml 25.57 ml/m LA Biplane Vol: 44.9 ml 25.57 ml/m  AORTIC VALVE LVOT Vmax:   90.05 cm/s LVOT Vmean:  58.200 cm/s LVOT VTI:    0.205 m  AORTA Ao Root diam: 3.80 cm Ao Asc diam:  3.10 cm MITRAL VALVE MV Area (PHT): 2.62 cm    SHUNTS MV Decel Time: 290 msec    Systemic VTI:  0.20 m MV E velocity: 50.60 cm/s  Systemic Diam: 2.20 cm MV A velocity: 72.50 cm/s MV E/A ratio:  0.70 Dietrich Pates MD Electronically signed by Dietrich Pates MD Signature Date/Time: 02/29/2020/5:22:09 PM    Final    CT HEAD CODE STROKE WO CONTRAST  Result Date: 02/28/2020 CLINICAL DATA:  79 year old male found down, last seen well 02/25/2020. left face abrasions. Code stroke activated initially, but now canceled. EXAM: CT HEAD WITHOUT CONTRAST TECHNIQUE: Contiguous axial images were obtained from the base of the skull through the vertex without intravenous contrast. COMPARISON:  [facility] hospital brain MRI 06/08/2019, head CT 06/08/2019. FINDINGS: Brain: Confluent new hypodensity throughout the right basal ganglia which are swollen with mass effect on the left lateral ventricle. No midline shift. Nearby patchy opacity compatible with cytotoxic edema involving the superior right operculum (series 3, image 21). Underlying bilateral basal ganglia dystrophic calcifications. No acute intracranial hemorrhage identified. No ventriculomegaly. Basilar cisterns remain patent. Stable chronic cerebellar infarcts. No other acute cortically based infarct identified. Vascular: Calcified atherosclerosis at the  skull base. No right side hyperdense vessel identified. However there is conspicuous hyperdensity of a left MCA branch in the sylvian fissure on coronal image 32, however, this is probably stable since last month (series 8, image 47 of the prior CT). Skull: No skull fracture or No acute osseous abnormality identified. Sinuses/Orbits: Visualized paranasal sinuses and mastoids are stable and well pneumatized. Other: Broad-based left superior convexity scalp hematoma. There is left forehead and periorbital soft tissue hematoma also. No soft tissue gas. Leftward gaze, otherwise negative orbits. ASPECTS [facility] Stroke Program Early CT Score) Total score (0-10 with 10 being normal): 7 (right side caudate, lentiform, M5 abnormal). IMPRESSION: 1. 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 Mild regional mass effect. No hemorrhagic transformation or midline shift. 2. Otherwise stable noncontrast CT appearance of the brain from last month. 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 3. Left face and scalp soft tissue hematoma. No underlying fracture identified. Electronically Signed   By: Odessa Fleming M.D.   On:  02/28/2020 11:46   ECHOCARDIOGRAM LIMITED  Result Date: 03/01/2020    ECHOCARDIOGRAM LIMITED REPORT   Patient Name:   GASPER HOPES Date of Exam: 03/01/2020 Medical Rec #:  [id number]     Height:       71.0 in Accession #:    [id number]    Weight:       130.0 lb Date of Birth:  May 22, 1940      BSA:          1.756 m Patient Age:    79 years      BP:           157/89 mmHg Patient Gender: M             HR:           94 bpm. 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 Cardiomyopathy, CAD; Risk Factors:Diabetes,                 Dyslipidemia and  Former Smoker.  Sonographer:    Renella Cunas RDCS Referring Phys: [id number] JINDONG XU IMPRESSIONS  1. Left ventricular ejection fraction, by estimation, is approxiamtely 50%.  2. There is an elongated echodensity at the inferior apex of the left ventricle measuring approximately 1 x 2cm at largest dimentions, most consistent with mural thrombus. Does not appear freely mobile. There is hypokinesis to akinesis in this myocardial segment distribution.  3. Right ventricular systolic function is normal. The right ventricular size is normal. FINDINGS  Left Ventricle: Left ventricular ejection fraction, by estimation, is 50%. Definity contrast agent was given IV to delineate the left ventricular endocardial Colum Colt. Right Ventricle: The right ventricular size is normal. No increase in right ventricular wall thickness. Right ventricular systolic function is normal. Nona Dell MD Electronically signed by Nona Dell MD Signature Date/Time: 03/01/2020/2:05:59 PM    Final    CT Maxillofacial Wo Contrast  Result Date: 02/28/2020 CLINICAL DATA:  Facial trauma, weakness. EXAM: CT MAXILLOFACIAL WITHOUT CONTRAST TECHNIQUE: Multidetector CT imaging of the maxillofacial structures was performed. Multiplanar CT image reconstructions were also generated. COMPARISON:  02/28/2020 CT head. FINDINGS: Osseous: No fracture or mandibular dislocation. Left TMJ osteoarthrosis. Orbits: Globes are intact. Normal appearance of the extraocular muscles and optic nerve. No significant intraorbital fat stranding. Prominence of the left periorbital soft tissues. Sinuses: Clear paranasal sinuses.  No mastoid effusion. Soft tissues: Mild left scalp and facial soft tissue swelling. No walled-off fluid collection. Irregularity of the left perimandibular soft tissues may reflect overlying laceration. Limited intracranial: Please see prior same day head CT. IMPRESSION: No acute osseous abnormality.  Left TMJ osteoarthrosis. Mild left scalp and face  soft tissue swelling. No walled-off fluid collection. Left perimandibular soft tissue laceration. Electronically Signed   By: Stana Bunting M.D.   On: 02/28/2020 14:39    PERFORMANCE STATUS (ECOG) : 3 - Symptomatic, >50% confined to bed  Review of Systems Unable to provide  Physical Exam General: NAD, thin, frail-appearing Pulmonary: Unlabored Extremities: no edema, no joint deformities Skin: no rashes Neurological: Left-sided weakness and facial droop, some dysarthria, alert but oriented only to person  IMPRESSION: Patient seen and examined.  He is alert but oriented only to person.  He is unable to engage meaningfully  in a conversation regarding goals.  He underwent bedside swallow eval today and is pending repeat MBSS due to concern for aspiration.  I called and spoke with daughter by phone.  She reports having been updated on patient's status by hospitalist.  She says that her goals are aligned with continued treatment.  She would like patient to pursue rehab although she recognizes that he may not return to his previous functional or cognitive baseline.  She says that she is exploring options for eventually having him move to [place] even if that requires LTC.  We discussed possible future decision making including artificial nutrition.  She says that she thinks that patient would want a feeding tube if necessary.  She also believes that patient would want to remain a full code for now but would be willing to revisit decision-making if care appeared to be futile.  I suggested that patient be followed by palliative care at [facility] and daughter was in agreement.  PLAN: -Continue full code/full scope of treatment -Recommend palliative care follow at SNF   Time Total: 60 minutes  Visit consisted of counseling and education dealing with the complex and emotionally intense issues of symptom management and palliative care in the setting of serious and potentially life-threatening  illness.Greater than 50%  of this time was spent counseling and coordinating care related to the above assessment and plan.  Signed by: Laurette Schimke, PhD, NP-C

## 2020-03-01 NOTE — Progress Notes (Signed)
PROGRESS NOTE  [name]  DOB: 04/05/1941  PCP: Deatra Murl, MD IHK:[id number]  DOA: 02/28/2020  LOS: 2 days   Chief Complaint  Patient presents with  . Weakness    Brief narrative: [name] is a 79 y.o. male who presented to the ED on 02/28/2020 left-sided weakness. 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 Patient lives alone, with neighbors stopping by everyone as well.   Patient had not been seen by neighbors for 3 days thus a well check was made by police.  Police found him on the floor with house disheveled, patient drowsy, confused on the floor with left facial droop and left sided weakness.  It was not clear how long he was on the floor for. EMS brought to the ED as code stroke.    In the ED, patient was drowsy, sodium level elevated to 147, creatinine 1.55, CK 745, WBC count 8.6 CT head, MRI brain showed acute to subacute ischemic right MCA territory infarct involving the right basal ganglia and overlying right frontal lobe associated with petechial hemorrhage at the right basal ganglia.  He also has has chronic right cerebellar infarcts  MRA head and neck showed severe high-grade stenosis within the proximal right M1 segment.    Patient was admitted to hospitalist service. Neurology consultation was obtained.  Subjective: Patient was seen and examined this morning.   Alert, awake, not oriented to place or person.  Pending PT eval.   Not in distress  Assessment/Plan: Acute to subacute CVA right MCA, with left-sided paresis and aphasia  Chronic right cerebellar infarcts. -Neurology consult appreciated. -Etiology of acute infarct: thrombotic in the setting of significant intracranial atherosclerosis versus cardioembolic origin in the setting LV apical thrombus  -Imaging findings as above. -Echo with EF 45 to 50%, cannot rule out LV  thrombus. -Prior to admission, patient was on aspirin, Eliquis but suspect noncompliance..  Currently on Heparin drip.  We will switch back to Eliquis today.  Continue statin. -A1c 6.1.  Lipid panel with LDL 117. -PT/OT eval pending.  Hypertensive urgency -Blood pressure was elevated up to 190s on presentation. -Likely from not being able to take his BP meds in the last few days. -It is unclear at this time if patient was taking any blood pressure medicine at home. -Currently on hydralazine as needed.   -Long-term BP goal on 130-150 given right MCA high-grade stenosis versus occlusion.  Persistent LV thrombus Chronic systolic CHF -Echocardiogram with a EF 45 to 50% and cannot rule out LV thrombus.  Patient has a history of LV thrombus and noncompliant to Eliquis at home. -Eliquis resumed. -New echo ordered.  Currently on heparin drip.  Cardiac arrhythmia -Per records from [facility], patient has history of tachybradycardia syndrome/possible A fib/ A flutter -There is also concern of noncompliance to his medication.  Supposed to get digoxin and metoprolol at home. -Digoxin level on admission was subtherapeutic, noncompliance versus unable to take because of fall -Seen by speech therapy, oral feeding allowed.  Resume digoxin today. -Per outside records, while at [facility] in October 2021, he was planned for discharge with Zio patch but left prior to receiving his discharge paperwork or Zio patch.   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  IIDM -A1c 6.8 on 11/4  -continue sliding scale insulin.  Metformin on hold. Recent Labs  Lab  02/29/20 1759 02/29/20 2130 02/29/20 2157 03/01/20 0614 03/01/20 1135  GLUCAP 183* 65* 100* 167* 135*   CKD stage II -Baseline creatinine 1.2-1.6 -Patient eloped clinically dehydrated on admission, adequately hydrated.  Okay to stop IV fluid today. Recent Labs    02/28/20 1123 02/28/20 1409 02/29/20 0206 03/01/20 0057  BUN 39*  41* 32* 26*  CREATININE 1.55* 1.40* 1.35* 1.42*   Hypernatremia -Sodium level elevated to 149.  -Improved with D5 half NS. Recent Labs  Lab 02/28/20 1123 02/28/20 1409 02/29/20 0206 03/01/20 0057  NA 149* 147* 149* 143   Rhabdomyolysis -Mild elevation in CK likely because of prolonged downtime -Continue monitor with hydration. Recent Labs  Lab 02/28/20 1123 03/01/20 0057  CKTOTAL 745* 277   Mobility: PT eval obtained.  CIR recommended. Code Status:   Code Status: Full Code  Nutritional status: Body mass index is 18.13 kg/m.     Diet Order            DIET - DYS 1 Room service appropriate? Yes; Fluid consistency: Nectar Thick  Diet effective now                 DVT prophylaxis: Heparin drip   Antimicrobials:  None Fluid: Okay to stop IV fluid today Consultants: Neurology Family Communication:  None at bedside  Status is: Inpatient  Remains inpatient appropriate because: Remains altered, continued stroke work-up  Dispo: The patient is from: Home              Anticipated d/c is to: CIR              Anticipated d/c date is: Whenever bed available              Patient currently is medically stable to d/c.    Infusions:    Scheduled Meds: . apixaban  5 mg Oral BID  . atorvastatin  80 mg Oral q1800  . insulin aspart  0-9 Units Subcutaneous TID WC  . LORazepam  0.5 mg Intravenous Once  . mupirocin cream   Topical BID    Antimicrobials: Anti-infectives (From admission, onward)   None      PRN meds: acetaminophen **OR** acetaminophen (TYLENOL) oral liquid 160 mg/5 mL **OR** acetaminophen, hydrALAZINE   Objective: Vitals:   03/01/20 0800 03/01/20 1135  BP: 140/80 (!) 157/89  Pulse: 93 96  Resp: 20 18  Temp: (!) 97.4 F (36.3 C) 98 F (36.7 C)  SpO2:  99%    Intake/Output Summary (Last 24 hours) at 03/01/2020 1138 Last data filed at 02/29/2020 1725 Gross per 24 hour  Intake 116.22 ml  Output --  Net 116.22 ml   Filed Weights   02/28/20  1217  Weight: 59 kg   Weight change:  Body mass index is 18.13 kg/m.   Physical Exam: General exam: Appears calm and comfortable.  Not in physical distress Skin: No rashes, lesions or ulcers. HEENT: Atraumatic, normocephalic, supple neck, no obvious bleeding Lungs: Clear to auscultation bilaterally CVS: Regular rate and rhythm, no murmur GI/Abd soft, nontender, nondistended, bowels are present CNS: Eyes open.  Facial deviation to the right able to tell me his name but unable to give any other answers.  Able to follow motor commands. Psychiatry: Unable to examine because of mental status Extremities: No pedal edema, no calf tenderness  Data Review: I have personally reviewed the laboratory data and studies available.  Recent Labs  Lab 02/28/20 1123 02/28/20 1409 02/29/20 0206 03/01/20 0057  WBC 8.6  --  8.4 9.3  NEUTROABS 6.4  --   --   --   HGB 12.1* 11.6* 12.3* 11.7*  HCT 38.1* 34.0* 37.5* 35.6*  MCV 100.8*  --  98.7 100.3*  PLT 219  --  204 149*   Recent Labs  Lab 02/28/20 1123 02/28/20 1409 02/29/20 0206 03/01/20 0057  NA 149* 147* 149* 143  K 3.9 3.9 3.7 3.8  CL 112* 116* 113* 111  CO2 21*  --  22 17*  GLUCOSE 175* 165* 89 80  BUN 39* 41* 32* 26*  CREATININE 1.55* 1.40* 1.35* 1.42*  CALCIUM 9.7  --  9.6 8.8*    F/u labs ordered  Signed, Lorin Glass, MD [facility] 03/01/2020

## 2020-03-02 ENCOUNTER — Inpatient Hospital Stay (HOSPITAL_COMMUNITY): Payer: Medicare PPO

## 2020-03-02 LAB — BASIC METABOLIC PANEL
Anion gap: 13 (ref 5–15)
BUN: 15 mg/dL (ref 8–23)
CO2: 22 mmol/L (ref 22–32)
Calcium: 8.6 mg/dL — ABNORMAL LOW (ref 8.9–10.3)
Chloride: 106 mmol/L (ref 98–111)
Creatinine, Ser: 1.36 mg/dL — ABNORMAL HIGH (ref 0.61–1.24)
GFR, Estimated: 54 mL/min — ABNORMAL LOW (ref >60)
Glucose, Bld: 116 mg/dL — ABNORMAL HIGH (ref 70–99)
Potassium: 3.1 mmol/L — ABNORMAL LOW (ref 3.5–5.1)
Sodium: 141 mmol/L (ref 135–145)

## 2020-03-02 LAB — GLUCOSE, CAPILLARY
Glucose-Capillary: 106 mg/dL — ABNORMAL HIGH (ref 70–99)
Glucose-Capillary: 181 mg/dL — ABNORMAL HIGH (ref 70–99)
Glucose-Capillary: 271 mg/dL — ABNORMAL HIGH (ref 70–99)
Glucose-Capillary: 204 mg/dL — ABNORMAL HIGH (ref 70–99)

## 2020-03-02 LAB — CBC
HCT: 34 % — ABNORMAL LOW (ref 39.0–52.0)
Hemoglobin: 11.4 g/dL — ABNORMAL LOW (ref 13.0–17.0)
MCH: 32.3 pg (ref 26.0–34.0)
MCHC: 33.5 g/dL (ref 30.0–36.0)
MCV: 96.3 fL (ref 80.0–100.0)
Platelets: 184 10*3/uL (ref 150–400)
RBC: 3.53 MIL/uL — ABNORMAL LOW (ref 4.22–5.81)
RDW: 12.5 % (ref 11.5–15.5)
WBC: 4.5 10*3/uL (ref 4.0–10.5)
nRBC: 0 % (ref 0.0–0.2)

## 2020-03-02 LAB — CK: Total CK: 767 U/L — ABNORMAL HIGH (ref 49–397)

## 2020-03-02 MED ORDER — DIGOXIN 125 MCG PO TABS
0.1250 mg | ORAL_TABLET | Freq: Every day | ORAL | Status: DC
  Administered 2020-03-02 – 2020-03-05 (×4): 0.125 mg via ORAL
  Filled 2020-03-02 (×4): qty 1

## 2020-03-02 MED ORDER — POTASSIUM CHLORIDE CRYS ER 20 MEQ PO TBCR
40.0000 meq | EXTENDED_RELEASE_TABLET | Freq: Once | ORAL | Status: AC
  Administered 2020-03-02: 40 meq via ORAL
  Filled 2020-03-02: qty 2

## 2020-03-02 NOTE — Progress Notes (Signed)
PROGRESS NOTE  [name]  DOB: April 09, 1941  PCP: Deatra Trevaun, MD WEX:[id number]  DOA: 02/28/2020  LOS: 3 days   Chief Complaint  Patient presents with  . Weakness    Brief narrative: [name] is a 79 y.o. male who presented to the ED on 02/28/2020 left-sided weakness. 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 Patient lives alone, with neighbors stopping by everyone as well.   Patient had not been seen by neighbors for 3 days thus a well check was made by police.  Police found him on the floor with house disheveled, patient drowsy, confused on the floor with left facial droop and left sided weakness.  It was not clear how long he was on the floor for. EMS brought to the ED as code stroke.    In the ED, patient was drowsy, left suggested dehydration.   CT head, MRI brain showed acute to subacute ischemic right MCA territory infarct involving the right basal ganglia and overlying right frontal lobe associated with petechial hemorrhage at the right basal ganglia.  He also has has chronic right cerebellar infarcts  MRA head and neck showed severe high-grade stenosis within the proximal right M1 segment.   Patient was admitted to hospitalist service. Neurology consultation was obtained.  Subjective: Patient was seen and examined this morning.   Lying on bed.  Not in distress.  Able to answer simple questions.  Knows he is in the hospital. Not restless or agitated. Noted an episode of fever of 100.5 yesterday evening.  Assessment/Plan: Acute to subacute CVA right MCA, with left-sided paresis and aphasia  Chronic right cerebellar infarcts. -Neurology consult appreciated. -Etiology of acute infarct: thrombotic in the setting of significant intracranial atherosclerosis versus cardioembolic origin in the setting LV apical thrombus  -Imaging findings as  above. -Echo with EF 45 to 50%, cannot rule out LV thrombus. -Prior to admission, patient was on aspirin, Eliquis but suspect noncompliance. Eliquis has been resumed. Continue statin. -A1c 6.1.  Lipid panel with LDL 117. -PT/OT eval obtained.  CIR recommended  Hypertensive urgency -Blood pressure was elevated up to 190s on presentation. -Likely secondary to noncompliance. -Currently on hydralazine as needed only. -Blood pressure mostly less than 150.  Continue to monitor. -Long-term BP goal on 130-150 given right MCA high-grade stenosis versus occlusion.  Persistent LV thrombus Chronic systolic CHF -Echocardiogram with a EF 45 to 50% and cannot rule out LV thrombus.  Patient has a history of LV thrombus and noncompliant to Eliquis at home. -Eliquis resumed.  Cardiac arrhythmia -Per records from [facility], patient has history of tachybradycardia syndrome/possible A fib/ A flutter -Supposed to be on digoxin and metoprolol at home.  Suspected noncompliance. -Digoxin level on admission was subtherapeutic -Digoxin has been resumed.  Heart rate controlled at this time.  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  IIDM -A1c 6.8 on 11/4  -continue sliding scale insulin.  Metformin on hold. Recent Labs  Lab 03/01/20 1135 03/01/20 1731 03/01/20 2219 03/01/20 2252 03/02/20 0618  GLUCAP 135* 211* 63* 138* 106*   CKD stage II -Baseline creatinine 1.2-1.6 -Patient looked clinically dehydrated on admission, adequately hydrated.  Creatinine improved. Recent Labs    02/28/20 1123 02/28/20 1409 02/29/20 0206 03/01/20 0057 03/02/20 0451  BUN 39* 41* 32* 26* 15  CREATININE 1.55* 1.40* 1.35* 1.42* 1.36*   Hypernatremia -  Sodium level elevated to 149.  -Improved with D5 half NS.  Currently normal. Recent Labs  Lab 02/28/20 1123 02/28/20 1409 02/29/20 0206 03/01/20 0057 03/02/20 0451  NA 149* 147* 149* 143 141   Rhabdomyolysis -Mild elevation in CK likely  because of prolonged downtime -Improved with hydration.  Hypokalemia -Potassium 3.1 today.  Replacement ordered.  Low-grade fever -One episode of temperature 100.5 yesterday.  Chest x-ray normal. -Continue to monitor.  Mobility: PT eval obtained.  CIR recommended. Code Status:   Code Status: Full Code  Nutritional status: Body mass index is 18.13 kg/m.     Diet Order            DIET - DYS 1 Room service appropriate? Yes; Fluid consistency: Nectar Thick  Diet effective now                 DVT prophylaxis: Heparin drip   Antimicrobials:  None Fluid: Not on IV fluid Consultants: Neurology Family Communication:  None at bedside  Status is: Inpatient  Remains inpatient appropriate because: Remains altered, continued stroke work-up  Dispo: The patient is from: Home              Anticipated d/c is to: CIR              Anticipated d/c date is: Whenever bed available              Patient currently is medically stable to d/c.   Infusions:    Scheduled Meds: . apixaban  5 mg Oral BID  . atorvastatin  80 mg Oral q1800  . insulin aspart  0-9 Units Subcutaneous TID WC  . LORazepam  0.5 mg Intravenous Once  . mupirocin cream   Topical BID  . potassium chloride  40 mEq Oral Once    Antimicrobials: Anti-infectives (From admission, onward)   None      PRN meds: acetaminophen **OR** acetaminophen (TYLENOL) oral liquid 160 mg/5 mL **OR** acetaminophen, hydrALAZINE   Objective: Vitals:   03/02/20 0846 03/02/20 1146  BP: (!) 149/72 (!) 152/93  Pulse: 75 71  Resp: 18 18  Temp: 98.8 F (37.1 C)   SpO2: 99% 100%   No intake or output data in the 24 hours ending 03/02/20 1147 Filed Weights   02/28/20 1217  Weight: 59 kg   Weight change:  Body mass index is 18.13 kg/m.   Physical Exam: General exam: Appears calm and comfortable.  Not in physical distress Skin: No rashes, lesions or ulcers. HEENT: Atraumatic, normocephalic, supple neck, no obvious  bleeding Lungs: Clear to auscultation bilaterally CVS: Regular rate and rhythm, no murmur GI/Abd soft, nontender, nondistended, bowels are present CNS: Alert, awake, knows he is in the hospital.  Not in distress.  Able to follow motor commands. Psychiatry: Mood appropriate Extremities: No pedal edema, no calf tenderness  Data Review: I have personally reviewed the laboratory data and studies available.  Recent Labs  Lab 02/28/20 1123 02/28/20 1409 02/29/20 0206 03/01/20 0057 03/02/20 0451  WBC 8.6  --  8.4 9.3 4.5  NEUTROABS 6.4  --   --   --   --   HGB 12.1* 11.6* 12.3* 11.7* 11.4*  HCT 38.1* 34.0* 37.5* 35.6* 34.0*  MCV 100.8*  --  98.7 100.3* 96.3  PLT 219  --  204 149* 184   Recent Labs  Lab 02/28/20 1123 02/28/20 1409 02/29/20 0206 03/01/20 0057 03/02/20 0451  NA 149* 147* 149* 143 141  K 3.9 3.9 3.7 3.8 3.1*  CL 112* 116* 113* 111 106  CO2 21*  --  22 17* 22  GLUCOSE 175* 165* 89 80 116*  BUN 39* 41* 32* 26* 15  CREATININE 1.55* 1.40* 1.35* 1.42* 1.36*  CALCIUM 9.7  --  9.6 8.8* 8.6*    F/u labs ordered  Signed, Lorin Glass, MD [facility] 03/02/2020

## 2020-03-02 NOTE — Progress Notes (Signed)
Tele called, "can you go check on [name]? He is having these runs more often now." Did note that lab was outside the pts room and he was awake with hands fiddling on the leads. Encouraged pt to continue resting. Pt is calm and cooperative at this time. Denies chest pain or feeling of palpitations. Endorses he is comfortable.

## 2020-03-02 NOTE — Progress Notes (Signed)
Tele calling rt increased ectopy, pt continues to hang one leg over the edge of the bed and pick at tele leads. Denies chest pain, palpitations. Endorses he is comfortable but "I want my shoes". Reoriented the patient, covered the pt with gown/covers which slid off, and assisted pt to lift his leg back in bed.

## 2020-03-02 NOTE — Progress Notes (Signed)
  Speech Language Pathology Treatment: Dysphagia  Patient Details Name: [name] MRN: [id number] DOB: 1941-04-01 Today's Date: 03/02/2020 Time: 1194-1740 SLP Time Calculation (min) (ACUTE ONLY): 33 min  Assessment / Plan / Recommendation Clinical Impression  Patient seen at bedside for diet tolerance assessment. Patient alert in bed upon arrival, attempting to get out of bed but redirected with moderate cues and an alternative task. Patient independently initiated self feeding once tray placed in front of him, positioned at 90 degrees in bed. Patient however required max cueing for safe swallowing strategies, placing multiple bites/sip of pos in oral cavity without initiation of a swallow, resulting in eventual hard cough, likely aspiration. SLP attempted multiple compensatory strategies, including laryngeal palation/tactile cueing, visual cueing, and use of empty spoon, all unsuccessful at eliciting a pharyngeal swallow. SLP suctioned oral cavity. Once oral cavity cleared, patient able to consume nectar thick liquid via straw and multiple consecutive sips with consistent swallow response and no overt s/s of aspiration. SLP attempted to resume solid pos after this however post 2-3 bites which patient consumed rapidly and with quick swallow, patient again with oral holding/falling asleep, requiring suctioning. Current diet remains appropriate IF patient is alert and consistently swallowing. Feeder/supervisor to ensure that patient has swallowed before offering next bite/sip.    HPI HPI: Pt is a 79 year old male who presented with L-sided weakness, L facial droop, and confusion after being on the ground at home and appears with abrasion on left side of his face. He did not receive tPA and NIH 14-16. 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 It also showed "Associated petechial hemorrhage at the right basal ganglia without frank hemorrhagic  transformation or significant regional mass effect", per MRI report. Medical hx of: CAD, DM, tobaccos use and ischemic cardiomyopathy.      SLP Plan  Continue with current plan of care       Recommendations  Diet recommendations: Dysphagia 1 (puree);Nectar-thick liquid Liquids provided via: Cup;Straw Medication Administration: Crushed with puree Supervision: Patient able to self feed;Full supervision/cueing for compensatory strategies Compensations: Slow rate;Small sips/bites (ensure that patient has swallowed prior to next bite/sip) Postural Changes and/or Swallow Maneuvers: Seated upright 90 degrees                Oral Care Recommendations: Oral care QID Follow up Recommendations: Inpatient Rehab SLP Visit Diagnosis: Dysphagia, oropharyngeal phase (R13.12) Plan: Continue with current plan of care                   [facility] MA, CCC-SLP     McCoy Leah Meryl 03/02/2020, 9:28 AM

## 2020-03-02 NOTE — Plan of Care (Signed)
Pt able to follow commands/ improved memory today from my shift yesterday. Did have a conversation with the pt regarding call light use, fall risk, safety socks, bed alarm. pt was able to demonstrate appropriate use of the call light and verbalize understanding of safety measures. Pt has been compliant with staying in bed and calling for help with assistance of tele sitter and frequent rounding. Call light within reach and pt demonstrates appropriate use, nonskid socks yellow on feet, bed alarm on and bed locked and low with siderails x3 per pt request, personal belongings within reach and clutter removed from pathways.

## 2020-03-02 NOTE — Progress Notes (Signed)
While rounding on the unit the charge RN stated that she would like for me to lay eyes on this patient.  Patient presented with left sided weakness and headache.  It was reported that the patient was trying to get out of bed all day yesterday.  Today he has been more somnolent and sleepy.  NIHSS performed on patient and is documented in flowsheets.  Patient still has residual left sided weakness.  There was concern that the patient had aspirated yesterday, and has a low grade temperature today.  Chest xray ordered. Discussed my concerns with MD.  Will round again on this patient

## 2020-03-02 NOTE — Discharge Instructions (Signed)
Information on my medicine - ELIQUIS (apixaban)   Why was Eliquis prescribed for you? Eliquis was prescribed for you to reduce the risk of a blood clot forming that can cause a stroke. It was prescribed for possible blood clot in a chamber of the heart, which has the risk of breaking off and circulating and causing a stroke.  What do You need to know about Eliquis ? Take your Eliquis TWICE DAILY - one tablet in the morning and one tablet in the evening with or without food. If you have difficulty swallowing the tablet whole please discuss with your pharmacist how to take the medication safely.  Take Eliquis exactly as prescribed by your doctor and DO NOT stop taking Eliquis without talking to the doctor who prescribed the medication.  Stopping may increase your risk of developing a stroke.  Refill your prescription before you run out.  After discharge, you should have regular check-up appointments with your healthcare provider that is prescribing your Eliquis.  In the future your dose may need to be changed if your kidney function or weight changes by a significant amount or as you get older.  What do you do if you miss a dose? If you miss a dose, take it as soon as you remember on the same day and resume taking twice daily.  Do not take more than one dose of ELIQUIS at the same time to make up a missed dose.  Important Safety Information A possible side effect of Eliquis is bleeding. You should call your healthcare provider right away if you experience any of the following: ? Bleeding from an injury or your nose that does not stop. ? Unusual colored urine (red or dark brown) or unusual colored stools (red or black). ? Unusual bruising for unknown reasons. ? A serious fall or if you hit your head (even if there is no bleeding).  Some medicines may interact with Eliquis and might increase your risk of bleeding or clotting while on Eliquis. To help avoid this, consult your healthcare  provider or pharmacist prior to using any new prescription or non-prescription medications, including herbals, vitamins, non-steroidal anti-inflammatory drugs (NSAIDs) and supplements.  This website has more information on Eliquis (apixaban): http://[email or web address].com/eliquis/home

## 2020-03-03 LAB — BASIC METABOLIC PANEL
Anion gap: 10 (ref 5–15)
BUN: 18 mg/dL (ref 8–23)
CO2: 20 mmol/L — ABNORMAL LOW (ref 22–32)
Calcium: 8.6 mg/dL — ABNORMAL LOW (ref 8.9–10.3)
Chloride: 110 mmol/L (ref 98–111)
Creatinine, Ser: 1.21 mg/dL (ref 0.61–1.24)
GFR, Estimated: >60 mL/min (ref >60)
Glucose, Bld: 135 mg/dL — ABNORMAL HIGH (ref 70–99)
Potassium: 3.3 mmol/L — ABNORMAL LOW (ref 3.5–5.1)
Sodium: 140 mmol/L (ref 135–145)

## 2020-03-03 LAB — GLUCOSE, CAPILLARY
Glucose-Capillary: 152 mg/dL — ABNORMAL HIGH (ref 70–99)
Glucose-Capillary: 134 mg/dL — ABNORMAL HIGH (ref 70–99)
Glucose-Capillary: 108 mg/dL — ABNORMAL HIGH (ref 70–99)
Glucose-Capillary: 255 mg/dL — ABNORMAL HIGH (ref 70–99)

## 2020-03-03 LAB — CK: Total CK: 433 U/L — ABNORMAL HIGH (ref 49–397)

## 2020-03-03 MED ORDER — POTASSIUM CHLORIDE CRYS ER 20 MEQ PO TBCR
40.0000 meq | EXTENDED_RELEASE_TABLET | Freq: Once | ORAL | Status: AC
  Administered 2020-03-03: 40 meq via ORAL
  Filled 2020-03-03: qty 2

## 2020-03-03 NOTE — Progress Notes (Signed)
OT Cancellation Note  Patient Details Name: [name] MRN: [id number] DOB: Nov 28, 1940   Cancelled Treatment:    Reason Eval/Treat Not Completed: Fatigue/lethargy limiting ability to participate;Other (comment) Pt asleep upon OTA arrival. Unable to arouse pt despite max multimodal cues. Will check back as time allows for OT session.   Audery Amel., COTA/L Acute Rehabilitation Services [phone]   Angelina Pih 03/03/2020, 10:53 AM

## 2020-03-03 NOTE — Care Management Important Message (Signed)
Important Message  Patient Details  Name: [name] MRN: [id number] Date of Birth: 01/24/1941   Medicare Important Message Given:  Yes     Dorena Bodo 03/03/2020, 3:58 PM

## 2020-03-03 NOTE — Progress Notes (Signed)
Occupational Therapy Treatment Patient Details Name: [name] MRN: [id number] DOB: 08-07-1940 Today's Date: 03/03/2020    History of present illness Pt is a 79 year old male who presented with L-sided weakness, L facial droop, and confusion after being on the ground at home. He did not receive tPA and NIH 14-16. 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 It also showed "Associated petechial hemorrhage at the right basal ganglia without frank hemorrhagic transformation or significant regional mass effect", per MRI report. Medical hx of: CAD, DM, and ischemic cardiomyopathy.   OT comments  Pt making steady progress towards OT goals this session. Pt continues to present with impaired balance, cognitive deficits, decreased activity tolerance and fatigue impacting pts ability to complete BADLs. Pt lethargic upon OTA arrival but reports needing to void bowels. Pt required MOD A for bed mobility and MOD A to transfer from EOB<>BSC x2 as pt reports needing to void bowels again once pt back in supine. Pt following commands inconsistently during session at times needing max multimodal cues to sequence mobility tasks. Pt completed light therex as indicated below as precursor to higher level BADLs as pt continues to present with L sided weakness. Pt would continue to benefit from skilled occupational therapy while admitted and after d/c to address the below listed limitations in order to improve overall functional mobility and facilitate independence with BADL participation. DC plan remains appropriate, will follow acutely per POC.     Follow Up Recommendations  SNF    Equipment Recommendations  3 in 1 bedside commode;Wheelchair (measurements OT);Wheelchair cushion (measurements OT);Hospital bed    Recommendations for Other Services      Precautions / Restrictions Precautions Precautions: Fall Restrictions Weight Bearing Restrictions:  No       Mobility Bed Mobility Overal bed mobility: Needs Assistance Bed Mobility: Supine to Sit;Sit to Supine     Supine to sit: Mod assist;HOB elevated Sit to supine: Max assist   General bed mobility comments: pt required MOD A to maneuver BLEs to EOB and MOD A to elevate trunk and use of bed pad to scoot hips to EOB. pt requried MAX A to return to supine as pt unable to sequnce task without assist  Transfers Overall transfer level: Needs assistance Equipment used: 1 person hand held assist Transfers: Sit to/from [employer] Sit to Stand: Mod assist Stand pivot transfers: Mod assist       General transfer comment: pt required step by step cues to sequnce all mobility tasks. pt required tactile cues for pt to initiate pivotal steps to [facility]. pt completed x2 stand pivot transfers from EOB<>BSC. max mulitmodal cues for hand placement and sequencing of task    Balance Overall balance assessment: Needs assistance Sitting-balance support: Bilateral upper extremity supported;Feet supported Sitting balance-Leahy Scale: Poor Sitting balance - Comments: Maintains static sitting balance EOB with B UE support and no LOB with supervision for safety.   Standing balance support: Bilateral upper extremity supported;During functional activity Standing balance-Leahy Scale: Poor Standing balance comment: reliant on at least one UE supported during static standing                           ADL either performed or assessed with clinical judgement   ADL Overall ADL's : Needs assistance/impaired  Toilet Transfer: Moderate assistance;Stand-pivot;Cueing for sequencing;Cueing for safety Toilet Transfer Details (indicate cue type and reason): pt completed x2 stand pivots to [facility] with MOD A, pt requried step by step cues to intiiate pivotal steps to [facility]. pt required cues for hand placement and tacile cues to bend at hips to sit on  seat Toileting- Clothing Manipulation and Hygiene: Sit to/from stand;Total assistance;Bed level Toileting - Clothing Manipulation Details (indicate cue type and reason): after first BM pt requried total A for posterior pericare in standing, however after second BM deferred pericare to bed level d/t pt fatigue and no +2 assist available.     Functional mobility during ADLs: Moderate assistance;Cueing for sequencing;Cueing for safety General ADL Comments: pt present with impaired balance, cognitive deficits, decreased activity tolerance and fatigue     Vision       Perception     Praxis      Cognition Arousal/Alertness: Awake/alert Behavior During Therapy: Flat affect;Impulsive (pt mostly flat during session but then noted to impulsively reach out to touch therapists arm during transfers) Overall Cognitive Status: Impaired/Different from baseline Area of Impairment: Attention;Memory;Following commands;Safety/judgement;Awareness;Problem solving;Orientation                 Orientation Level: Disoriented to;Time (stated today was Thursday March 13, 2020) Current Attention Level: Sustained Memory: Decreased short-term memory Following Commands: Follows one step commands inconsistently Safety/Judgement: Decreased awareness of safety;Decreased awareness of deficits Awareness: Emergent Problem Solving: Slow processing;Decreased initiation;Difficulty sequencing;Requires verbal cues;Requires tactile cues General Comments: pt able to verbalize need of needing to void bowels but mostly flat during session following one step commands inconsistenly at times needing up to MAX multimodal cues to follow commands related to mobility. pt noted to impulsive reach out and grab therapists arm and randomly states " i guess we are your children"        Exercises Exercises: General Lower Extremity;Other exercises General Exercises - Upper Extremity Shoulder Flexion: PROM;Left;5 reps;Supine Elbow  Flexion: Left;5 reps;Supine;AAROM Elbow Extension: Left;AAROM;5 reps;Supine Wrist Flexion: AAROM;Left;5 reps;Supine Wrist Extension: AAROM;Left;5 reps;Supine  Other Exercises Other Exercises: pt reports no feeling in L hand; unable to assess sensation secondary to cognitive deficits; noted 2+/5 composite flexion/ extension L hand    Shoulder Instructions       General Comments noted to have wounds on bil knees and L shoulder without bandages. did have BM during session ( documented in flowsheets)    Pertinent Vitals/ Pain       Pain Assessment: No/denies pain  Home Living                                          Prior Functioning/Environment              Frequency  Min 2X/week        Progress Toward Goals  OT Goals(current goals can now be found in the care plan section)  Progress towards OT goals: Progressing toward goals  Acute Rehab OT Goals Patient Stated Goal: to get better OT Goal Formulation: Patient unable to participate in goal setting Time For Goal Achievement: 03/14/20 Potential to Achieve Goals: Good  Plan Discharge plan remains appropriate;Frequency remains appropriate    Co-evaluation                 AM-PAC OT "6 Clicks" Daily Activity     Outcome Measure   Help from another person eating  meals?: A Little Help from another person taking care of personal grooming?: A Little Help from another person toileting, which includes using toliet, bedpan, or urinal?: A Lot Help from another person bathing (including washing, rinsing, drying)?: A Lot Help from another person to put on and taking off regular upper body clothing?: A Little Help from another person to put on and taking off regular lower body clothing?: A Lot 6 Click Score: 15    End of Session Equipment Utilized During Treatment: Gait belt;Other (comment) (BSC)  OT Visit Diagnosis: Unsteadiness on feet (R26.81);Muscle weakness (generalized) (M62.81)   Activity  Tolerance Patient tolerated treatment well   Patient Left in bed;with call bell/phone within reach;with bed alarm set;Other (comment) (mit on R hand)   Nurse Communication          Time: [phone] OT Time Calculation (min): 30 min  Charges: OT General Charges $OT Visit: 1 Visit OT Treatments $Self Care/Home Management : 8-22 mins $Therapeutic Exercise: 8-22 mins  Audery Amel., COTA/L Acute Rehabilitation Services [phone]    Angelina Pih 03/03/2020, 2:42 PM

## 2020-03-03 NOTE — Progress Notes (Signed)
PROGRESS NOTE  [name]  DOB: 27-Apr-1940  PCP: Deatra Collan, MD UOH:[id number]  DOA: 02/28/2020  LOS: 4 days   Chief Complaint  Patient presents with  . Weakness    Brief narrative: [name] is a 79 y.o. male who presented to the ED on 02/28/2020 left-sided weakness. 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 Patient lives alone, with neighbors stopping by everyone as well.   Patient had not been seen by neighbors for 3 days thus a well check was made by police.  Police found him on the floor with house disheveled, patient drowsy, confused on the floor with left facial droop and left sided weakness.  It was not clear how long he was on the floor for. EMS brought to the ED as code stroke.    In the ED, patient was drowsy, left suggested dehydration.   CT head, MRI brain showed acute to subacute ischemic right MCA territory infarct involving the right basal ganglia and overlying right frontal lobe associated with petechial hemorrhage at the right basal ganglia.  He also has has chronic right cerebellar infarcts  MRA head and neck showed severe high-grade stenosis within the proximal right M1 segment.   Patient was admitted to hospitalist service. Neurology consultation was obtained.  Subjective: Patient was seen and examined this morning.   Elderly African-American male.  Lying on bed.  Not in distress.  Slow to answer.  Knows he is in the hospital.  Not restless or agitated. No recurrence of fever last 24 hours  Assessment/Plan: Acute to subacute CVA right MCA, with left-sided paresis and aphasia  Chronic right cerebellar infarcts. -Neurology consult appreciated. -Etiology of acute infarct: thrombotic in the setting of significant intracranial atherosclerosis versus cardioembolic origin in the setting of LV apical thrombus  -Imaging findings as  above. -Echo with EF 45 to 50%, cannot rule out LV thrombus. -Prior to admission, patient was on aspirin, Eliquis but suspect noncompliance. Eliquis has been resumed. Continue statin. -A1c 6.1.  Lipid panel with LDL 117. -PT/OT eval obtained.  CIR recommended  Hypertensive urgency -Blood pressure was elevated up to 190s on presentation. -Likely secondary to noncompliance. -Currently on hydralazine as needed only. -Blood pressure mostly less than 150.  Continue to monitor. -Long-term BP goal on 130-150 given right MCA high-grade stenosis versus occlusion.  Persistent LV thrombus Chronic systolic CHF -Echocardiogram with a EF 45 to 50% and cannot rule out LV thrombus.  Patient has a history of LV thrombus and noncompliant to Eliquis at home. -Eliquis resumed.  Cardiac arrhythmia -Per records from [facility], patient has history of tachybradycardia syndrome/possible A fib/ A flutter -Supposed to be on digoxin and metoprolol at home.  Suspected noncompliance. -Digoxin level on admission was subtherapeutic -Digoxin has been resumed.  Heart rate controlled at this time.  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  IIDM -A1c 6.8 on 11/4  -continue sliding scale insulin.  Currently metformin on hold. -I would resume Metformin at discharge. Recent Labs  Lab 03/02/20 0618 03/02/20 1147 03/02/20 1712 03/02/20 2139 03/03/20 0614  GLUCAP 106* 181* 271* 204* 152*   CKD stage II -Baseline creatinine 1.2-1.6 -Patient looked clinically dehydrated on admission, adequately hydrated.  Creatinine improved. Recent Labs    02/28/20 1123 02/28/20 1409 02/29/20 0206 03/01/20 0057 03/02/20 0451 03/03/20 0107  BUN 39* 41* 32* 26*  15 18  CREATININE 1.55* 1.40* 1.35* 1.42* 1.36* 1.21   Hypernatremia -Sodium level elevated to 149.  -Improved with D5 half NS.  Currently normal. Recent Labs  Lab 02/28/20 1123 02/28/20 1409 02/29/20 0206 03/01/20 0057 03/02/20 0451  03/03/20 0107  NA 149* 147* 149* 143 141 140   Hypokalemia -Potassium remains low at 3.3 this morning.  40 mEq oral replacement ordered. Recent Labs  Lab 02/28/20 1409 02/29/20 0206 03/01/20 0057 03/02/20 0451 03/03/20 0107  K 3.9 3.7 3.8 3.1* 3.3*   Rhabdomyolysis -Mild elevation in CK likely because of prolonged downtime -Improved with hydration. Recent Labs  Lab 02/28/20 1123 03/01/20 0057 03/02/20 0451 03/03/20 0107  CKTOTAL 745* 277 767* 433*   Low-grade fever -One episode of temperature 100.5 on 11/6.Marland Kitchen  Chest x-ray normal. -No recurrence of fever last 24 hours.  Mobility: PT eval obtained.  CIR recommended. Code Status:   Code Status: Full Code  Nutritional status: Body mass index is 18.13 kg/m.     Diet Order            DIET - DYS 1 Room service appropriate? Yes; Fluid consistency: Nectar Thick  Diet effective now                 DVT prophylaxis: Heparin drip   Antimicrobials:  None Fluid: Not on IV fluid Consultants: Neurology Family Communication:  None at bedside  Status is: Inpatient  Remains inpatient appropriate because: Pending CIR evaluation  Dispo: The patient is from: Home              Anticipated d/c is to: CIR              Anticipated d/c date is: Whenever bed available              Patient currently is medically stable to d/c.   Infusions:    Scheduled Meds: . apixaban  5 mg Oral BID  . digoxin  0.125 mg Oral Daily  . insulin aspart  0-9 Units Subcutaneous TID WC  . mupirocin cream   Topical BID    Antimicrobials: Anti-infectives (From admission, onward)   None      PRN meds: acetaminophen **OR** acetaminophen (TYLENOL) oral liquid 160 mg/5 mL **OR** acetaminophen, hydrALAZINE   Objective: Vitals:   03/03/20 0343 03/03/20 0938  BP: 92/83 (!) 145/87  Pulse: 74 60  Resp:  17  Temp: (!) 97.5 F (36.4 C) 97.8 F (36.6 C)  SpO2: 97% 100%    Intake/Output Summary (Last 24 hours) at 03/03/2020 1153 Last data  filed at 03/03/2020 1027 Gross per 24 hour  Intake 600 ml  Output --  Net 600 ml   Filed Weights   02/28/20 1217  Weight: 59 kg   Weight change:  Body mass index is 18.13 kg/m.   Physical Exam: General exam: Appears calm and comfortable.  Not in physical distress Skin: No rashes, lesions or ulcers. HEENT: Atraumatic, normocephalic, supple neck, no obvious bleeding Lungs: Clear to auscultation bilaterally CVS: Regular rate and rhythm, no murmur GI/Abd soft, nontender, nondistended, bowels are present CNS: Alert, awake, knows he is in the hospital.  Not in distress.  Not restless or agitated.  Able to follow motor commands. Psychiatry: Mood appropriate Extremities: No pedal edema, no calf tenderness  Data Review: I have personally reviewed the laboratory data and studies available.  Recent Labs  Lab 02/28/20 1123 02/28/20 1409 02/29/20 0206 03/01/20 0057 03/02/20 0451  WBC 8.6  --  8.4 9.3  4.5  NEUTROABS 6.4  --   --   --   --   HGB 12.1* 11.6* 12.3* 11.7* 11.4*  HCT 38.1* 34.0* 37.5* 35.6* 34.0*  MCV 100.8*  --  98.7 100.3* 96.3  PLT 219  --  204 149* 184   Recent Labs  Lab 02/28/20 1123 02/28/20 1123 02/28/20 1409 02/29/20 0206 03/01/20 0057 03/02/20 0451 03/03/20 0107  NA 149*   < > 147* 149* 143 141 140  K 3.9   < > 3.9 3.7 3.8 3.1* 3.3*  CL 112*   < > 116* 113* 111 106 110  CO2 21*  --   --  22 17* 22 20*  GLUCOSE 175*   < > 165* 89 80 116* 135*  BUN 39*   < > 41* 32* 26* 15 18  CREATININE 1.55*   < > 1.40* 1.35* 1.42* 1.36* 1.21  CALCIUM 9.7  --   --  9.6 8.8* 8.6* 8.6*   < > = values in this interval not displayed.    F/u labs ordered  Signed, Lorin Glass, MD [facility] 03/03/2020

## 2020-03-03 NOTE — Progress Notes (Signed)
Physical Therapy Treatment Patient Details Name: [name] MRN: [id number] DOB: 11-12-1940 Today's Date: 03/03/2020    History of Present Illness Pt is a 79 year old male who presented with L-sided weakness, L facial droop, and confusion after being on the ground at home. He did not receive tPA and NIH 14-16. 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 It also showed "Associated petechial hemorrhage at the right basal ganglia without frank hemorrhagic transformation or significant regional mass effect", per MRI report. Medical hx of: CAD, DM, and ischemic cardiomyopathy.    PT Comments    Focused session on gait training and LE strengthening/stretching this date. Pt easily fatigues after ambulating several feet and is impulsive on attempting to sit regardless of proximity of sitting surface once fatigued. He displays excessive B knee flexion and requires assistance at the L knee when standing to maintain his balance. He displayed stiffness/tightness in his L hamstring, thus passive stretches were provided with improved flexibility noted upon additional reps. In addition, facilitated L LE muscular strengthening through placing the L foot posterior and the R anterior with STS transfers. Performed LAQ and seated marching to improve standing stability and LE advancement with gait, with improved quality noted on the L with subsequent reps. Will continue to follow acutely and recommend CIR upon d/c to address his deficits to maximize his independence and safety with all functional mobility.   Follow Up Recommendations  CIR;Supervision/Assistance - 24 hour     Equipment Recommendations  Rolling walker with 5" wheels;3in1 (PT) (TBD as pt progresses)    Recommendations for Other Services Rehab consult     Precautions / Restrictions Precautions Precautions: Fall Restrictions Weight Bearing Restrictions: No    Mobility  Bed  Mobility Overal bed mobility: Needs Assistance Bed Mobility: Supine to Sit     Supine to sit: Min assist;HOB elevated     General bed mobility comments: Extra time and extensive cues to continue managing L LE off EOB and to utilize bed rail to ascend trunk. Assistance and cues to shift weight to R and push L hip anteriorly to square up with EOB.  Transfers Overall transfer level: Needs assistance Equipment used: 1 person hand held assist Transfers: Sit to/from Stand Sit to Stand: Mod assist         General transfer comment: Cues to push up from current sitting surface, unsteadiness noted with pt grabbing for therapist or surface nearby upon coming to stand. L knee block provided and cues to place R foot anteriorly and L posteriorly along with shift weight to L to facilitate L muscle activation. Hand-over-hand cues for L hand placement on current surface to push up to stand.  Ambulation/Gait Ambulation/Gait assistance: Mod assist Gait Distance (Feet): 5 Feet (x3 reps) Assistive device: 1 person hand held assist Gait Pattern/deviations: Step-to pattern;Decreased step length - left;Decreased stance time - left;Decreased stride length;Shuffle;Narrow base of support Gait velocity: decreased Gait velocity interpretation: <1.31 ft/sec, indicative of household ambulator General Gait Details: TCs, VCs, and assistance provided to L knee during stance phase and swing phase to improve stability and step length, poor carryover noted. ModA to maintain balance with pt easily fatiguing and attempting to sit.   Stairs             Wheelchair Mobility    Modified Rankin (Stroke Patients Only) Modified Rankin (Stroke Patients Only) Pre-Morbid Rankin Score: No symptoms Modified Rankin: Moderately severe disability  Balance Overall balance assessment: Needs assistance Sitting-balance support: Bilateral upper extremity supported;Feet supported Sitting balance-Leahy Scale: Poor Sitting  balance - Comments: Maintains static sitting balance EOB with B UE support and no LOB with supervision for safety.   Standing balance support: Single extremity supported;During functional activity Standing balance-Leahy Scale: Poor Standing balance comment: Trunk sway and unsteadiness noted in standing with R HHA.                            Cognition Arousal/Alertness: Awake/alert Behavior During Therapy: Impulsive Overall Cognitive Status: Impaired/Different from baseline Area of Impairment: Attention;Memory;Following commands;Safety/judgement;Awareness;Problem solving;Orientation                 Orientation Level: Disoriented to;Time (stated today was Thursday March 13, 2020) Current Attention Level: Sustained Memory: Decreased short-term memory Following Commands: Follows one step commands inconsistently;Follows one step commands with increased time Safety/Judgement: Decreased awareness of safety;Decreased awareness of deficits Awareness: Emergent Problem Solving: Slow processing;Decreased initiation;Difficulty sequencing;Requires verbal cues;Requires tactile cues General Comments: Pt imulsive to attempt to sit when ambulating despite cues to continue to chair. Required extra time to process and respond to cues for hand placement or LE management throughout session. A&O to person, place, and situation.      Exercises General Exercises - Lower Extremity Long Arc Quad: AROM;Strengthening;Both;10 reps;Seated (x3 sets) Hip Flexion/Marching: AROM;Strengthening;Both;10 reps;Seated (x3 sets) Other Exercises Other Exercises: STS x3 reps with B UE support and R foot placed anterior and lateral with L foot placed posterior with L knee block Other Exercises: Passive stretch to L hamstring 3x ~20 seconds in sitting    General Comments        Pertinent Vitals/Pain Pain Assessment: No/denies pain    Home Living                      Prior Function             PT Goals (current goals can now be found in the care plan section) Acute Rehab PT Goals Patient Stated Goal: to get better PT Goal Formulation: With patient Time For Goal Achievement: 03/14/20 Potential to Achieve Goals: Good Progress towards PT goals: Progressing toward goals    Frequency    Min 4X/week      PT Plan Current plan remains appropriate    Co-evaluation              AM-PAC PT "6 Clicks" Mobility   Outcome Measure  Help needed turning from your back to your side while in a flat bed without using bedrails?: A Little Help needed moving from lying on your back to sitting on the side of a flat bed without using bedrails?: A Little Help needed moving to and from a bed to a chair (including a wheelchair)?: A Lot Help needed standing up from a chair using your arms (e.g., wheelchair or bedside chair)?: A Lot Help needed to walk in hospital room?: A Lot Help needed climbing 3-5 steps with a railing? : A Lot 6 Click Score: 14    End of Session Equipment Utilized During Treatment: Gait belt Activity Tolerance: Patient tolerated treatment well;Patient limited by fatigue Patient left: in chair;with call bell/phone within reach;with chair alarm set   PT Visit Diagnosis: Unsteadiness on feet (R26.81);Other abnormalities of gait and mobility (R26.89);Muscle weakness (generalized) (M62.81);Difficulty in walking, not elsewhere classified (R26.2);Other symptoms and signs involving the nervous system (R29.898);Hemiplegia and hemiparesis Hemiplegia - Right/Left: Left Hemiplegia -  dominant/non-dominant: Non-dominant Hemiplegia - caused by: Cerebral infarction     Time: 6503-5465 PT Time Calculation (min) (ACUTE ONLY): 34 min  Charges:  $Gait Training: 8-22 mins $Therapeutic Exercise: 8-22 mins                     Raymond Gurney, PT, DPT Acute Rehabilitation Services  Pager: [phone] Office: [phone]    Jewel Baize 03/03/2020, 12:56 PM

## 2020-03-04 ENCOUNTER — Inpatient Hospital Stay (HOSPITAL_COMMUNITY): Payer: Medicare PPO

## 2020-03-04 LAB — GLUCOSE, CAPILLARY
Glucose-Capillary: 127 mg/dL — ABNORMAL HIGH (ref 70–99)
Glucose-Capillary: 123 mg/dL — ABNORMAL HIGH (ref 70–99)
Glucose-Capillary: 218 mg/dL — ABNORMAL HIGH (ref 70–99)
Glucose-Capillary: 84 mg/dL (ref 70–99)

## 2020-03-04 LAB — URINALYSIS, ROUTINE W REFLEX MICROSCOPIC
Bilirubin Urine: NEGATIVE
Glucose, UA: NEGATIVE mg/dL
Ketones, ur: NEGATIVE mg/dL
Nitrite: NEGATIVE
Protein, ur: 30 mg/dL — AB
Specific Gravity, Urine: 1.02 (ref 1.005–1.030)
pH: 6 (ref 5.0–8.0)

## 2020-03-04 LAB — CBC WITH DIFFERENTIAL/PLATELET
Abs Immature Granulocytes: 0.1 10*3/uL — ABNORMAL HIGH (ref 0.00–0.07)
Basophils Absolute: 0 10*3/uL (ref 0.0–0.1)
Basophils Relative: 0 %
Eosinophils Absolute: 0 10*3/uL (ref 0.0–0.5)
Eosinophils Relative: 0 %
HCT: 32.9 % — ABNORMAL LOW (ref 39.0–52.0)
Hemoglobin: 11.1 g/dL — ABNORMAL LOW (ref 13.0–17.0)
Immature Granulocytes: 1 %
Lymphocytes Relative: 6 %
Lymphs Abs: 0.8 10*3/uL (ref 0.7–4.0)
MCH: 31.5 pg (ref 26.0–34.0)
MCHC: 33.7 g/dL (ref 30.0–36.0)
MCV: 93.5 fL (ref 80.0–100.0)
Monocytes Absolute: 1.6 10*3/uL — ABNORMAL HIGH (ref 0.1–1.0)
Monocytes Relative: 12 %
Neutro Abs: 10.6 10*3/uL — ABNORMAL HIGH (ref 1.7–7.7)
Neutrophils Relative %: 81 %
Platelets: 209 10*3/uL (ref 150–400)
RBC: 3.52 MIL/uL — ABNORMAL LOW (ref 4.22–5.81)
RDW: 12.5 % (ref 11.5–15.5)
WBC: 13.1 10*3/uL — ABNORMAL HIGH (ref 4.0–10.5)
nRBC: 0 % (ref 0.0–0.2)

## 2020-03-04 LAB — CK: Total CK: 343 U/L (ref 49–397)

## 2020-03-04 LAB — BASIC METABOLIC PANEL
Anion gap: 11 (ref 5–15)
BUN: 19 mg/dL (ref 8–23)
CO2: 21 mmol/L — ABNORMAL LOW (ref 22–32)
Calcium: 8.7 mg/dL — ABNORMAL LOW (ref 8.9–10.3)
Chloride: 109 mmol/L (ref 98–111)
Creatinine, Ser: 1.22 mg/dL (ref 0.61–1.24)
GFR, Estimated: >60 mL/min (ref >60)
Glucose, Bld: 142 mg/dL — ABNORMAL HIGH (ref 70–99)
Potassium: 3.6 mmol/L (ref 3.5–5.1)
Sodium: 141 mmol/L (ref 135–145)

## 2020-03-04 LAB — MAGNESIUM: Magnesium: 1.4 mg/dL — ABNORMAL LOW (ref 1.7–2.4)

## 2020-03-04 LAB — PHOSPHORUS: Phosphorus: 2.7 mg/dL (ref 2.5–4.6)

## 2020-03-04 MED ORDER — HALOPERIDOL LACTATE 5 MG/ML IJ SOLN
1.0000 mg | Freq: Once | INTRAMUSCULAR | Status: AC
  Administered 2020-03-04: 1 mg via INTRAVENOUS
  Filled 2020-03-04: qty 1

## 2020-03-04 MED ORDER — MAGNESIUM SULFATE 2 GM/50ML IV SOLN
2.0000 g | Freq: Once | INTRAVENOUS | Status: AC
  Administered 2020-03-04: 2 g via INTRAVENOUS
  Filled 2020-03-04: qty 50

## 2020-03-04 MED ORDER — ONDANSETRON HCL 4 MG/2ML IJ SOLN
4.0000 mg | Freq: Four times a day (QID) | INTRAMUSCULAR | Status: DC | PRN
  Administered 2020-03-04: 4 mg via INTRAVENOUS
  Filled 2020-03-04: qty 2

## 2020-03-04 NOTE — H&P (Signed)
Physical Medicine and Rehabilitation Admission H&P    Chief Complaint  Patient presents with  . 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 History taken from chart review due to cognition. He was found to have elevated BP with rhabdomyolysis and hypernatremia. 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  MRA brain revealed severe partially occlusive and/or recanalized thrombus in right M1 segment, moderate left P2 stenosis and MRA neck limited due to inability to tolerate exam. Echocardiogram with ejection fraction of 50% with elongated echodensity inferior apex consistent with mural thrombus with hypokinesis.   Per records has history of noncompliance and follows up with physicians at [facility]. He was started on IV heparin and Dr. Roda Shutters felt that stroke cardioembolic due to noncompliance with Eliquis.  Long term BP goal 130-150 give high grade R-MCA stenosis v/s occlusion. Agitation and confusion improving with use of prn Haldol.  Electrolyte abnormalities resolving with hydration but he developed GI distress with lethargy and decline overall mobility on 03/04/2020 due to low-grade fever, multiple episodes of N/V as well as multiple stools. He was found to have leukocytosis with WBC 13.1 with elevated procalcitonin levels.  He has had reports of SOB as well as coughing with meals and Augmentin added today due to concerns of aspiration pneumonitis. Hospital course  further complicated by post stroke dysphagia, started on dysphagia 1 nectar thick diet. Therapy has been ongoing and CIR was recommended due to functional decline.   Please see preadmission assessment from earlier today as well.  Review of Systems  Unable to perform ROS: Mental acuity    Past Medical History:  Diagnosis Date  . Coronary artery disease    a. 2007 dLAD 100->med rx;  b. 2011 PCI: 80 LAD (Promus 2.75x18); c. 03/2015 MV: predom fixed inf/antapical defefcts w/ ischemia, depressed EF;  c. 04/2015 NSTEMI/PCI: LM nl,LAD 40p ISR, 100d (2.5x20 Promus DES), LCX 71m (3.0x20 Promus DES), RCA 30p, 49m, 70d.   . Diabetes mellitus without complication (HCC)   . Dyslipidemia   . Hx MRSA infection   . Ischemic cardiomyopathy    a. 04/2015 Echo: EF 25-30%, apical AK, Gr 2 DD, triv AI, mild MR, mildly dil LA, PASP .  . Tobacco abuse     Past Surgical History:  Procedure Laterality Date  . CARDIAC CATHETERIZATION N/A 05/16/2015   Procedure: Left Heart Cath and Coronary Angiography;  Surgeon: Tonny Bollman, MD;  Location: [facility] INVASIVE CV LAB;  Service: Cardiovascular;  Laterality: N/A;  . CARDIAC CATHETERIZATION N/A 05/16/2015   Procedure: Coronary Stent Intervention;  Surgeon: Tonny Bollman, MD;  Location: [facility] INVASIVE CV LAB;  Service: Cardiovascular;  Laterality: N/A;  . TOTAL SHOULDER REPLACEMENT     Right    Family History: Unable to elicit due to somnolence.     Social History:  Divorced. 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 Daughter in [place]. He reports that he has quit smoking. He has never used smokeless tobacco. He reports that he does not drink alcohol and does not  use drugs.    Allergies: No Known Allergies    Medications Prior to Admission  Medication Sig Dispense Refill  . aspirin 81 MG tablet Take 81 mg by mouth daily.     Marland Kitchen atorvastatin (LIPITOR) 80 MG tablet Take 1 tablet (80 mg total) by mouth daily at 6 PM. PLEASE CONTACT OFFICE FOR ADDITIONAL REFILLS FINAL WARNING 30  tablet 0  . cyclobenzaprine (FLEXERIL) 5 MG tablet Take 5 mg by mouth at bedtime as needed for muscle spasms.     . digoxin (LANOXIN) 0.125 MG tablet Take 0.125 mg by mouth daily.     Marland Kitchen ELIQUIS 5 MG TABS tablet Take 5 mg by mouth 2 (two) times daily.    . isosorbide mononitrate (IMDUR) 30 MG 24 hr tablet Take 30 mg by mouth daily.    . metFORMIN (GLUCOPHAGE) 500 MG tablet Take 500 mg by mouth 2 (two) times daily.    . nitroGLYCERIN (NITROSTAT) 0.4 MG SL tablet PLACE 1 TABLET UNDER THE TONGUE AS DIRECTED. (Patient taking differently: Place under the tongue every 5 (five) minutes as needed for chest pain (max 5 doses). ) 75 tablet 0  . BRILINTA 90 MG TABS tablet TAKE 1 TABLET (90 MG TOTAL) BY MOUTH 2 (TWO) TIMES DAILY. (Patient not taking: Reported on 02/28/2020) 60 tablet 0  . BRILINTA 90 MG TABS tablet TAKE 1 TABLET (90 MG TOTAL) BY MOUTH 2 (TWO) TIMES DAILY. **NEEDS OV** (Patient not taking: Reported on 02/28/2020) 60 tablet 0  . metoprolol succinate (TOPROL XL) 50 MG 24 hr tablet Take 1 tablet (50 mg total) by mouth daily. Take with or immediately following a meal. (Patient not taking: Reported on 02/28/2020) 30 tablet 11  . ramipril (ALTACE) 10 MG capsule Take 1 capsule (10 mg total) by mouth daily. (Patient not taking: Reported on 02/28/2020) 30 capsule 11    Drug Regimen Review  Drug regimen was reviewed and remains appropriate with no significant issues identified  Home: Home Living Family/patient expects to be discharged to:: Private residence Living Arrangements: Alone Available Help at Discharge: Family (daughter lives in [place]) Type of Home: House Home Access: Stairs to enter Secretary/administrator of Steps: 2 Entrance Stairs-Rails: None Home Layout: Two level, Able to live on main level with bedroom/bathroom Alternate Level Stairs-Number of Steps: a flight of stairs Alternate Level Stairs-Rails: Right, Left, Can reach both Bathroom Shower/Tub: Health visitor:  Standard Bathroom Accessibility: Yes Home Equipment: Grab bars - toilet, Cane - single point, Wheelchair - manual   Functional History: Prior Function Level of Independence: Independent with assistive device(s) Comments: Pt drives.  Functional Status:  Mobility: Bed Mobility Overal bed mobility: Needs Assistance Bed Mobility: Supine to Sit, Sit to Supine Supine to sit: HOB elevated, Min assist Sit to supine: HOB elevated, Mod assist General bed mobility comments: MinA to transition supine > sit R EOB with cues for LE management off EOB and to place R hand on R bed rail to ascend trunk. Sit > supine with modA at LEs and trunk. Transfers Overall transfer level: Needs assistance Equipment used: 1 person hand held assist Transfers: Sit to/from Stand Sit to Stand: Min assist Stand pivot transfers: Mod assist General transfer comment: STS 2x from EOB, cuing pt for B hand placement and providing L knee block. Counting down from 2 with pt initiating transfer and requiring extra time to power up to stand. Ambulation/Gait Ambulation/Gait assistance: Mod assist Gait Distance (Feet): 2 Feet (x2 bouts, anterior and posterior steps with B  LEs 1-2x each) Assistive device: 1 person hand held assist Gait Pattern/deviations: Step-to pattern, Decreased stride length, Decreased weight shift to right, Narrow base of support General Gait Details: Pt resisting weight shift to R LE despite max cues and pt education on pushing tendency. L knee block provided and step-by-step VCs provided to seuqence weight shifts and LE advancement with 1-2 steps anterior and posterior each LE x2 bouts. Gait velocity: decreased Gait velocity interpretation: <1.31 ft/sec, indicative of household ambulator    ADL: ADL Overall ADL's : Needs assistance/impaired Eating/Feeding: Maximal assistance Eating/Feeding Details (indicate cue type and reason): unsafe due to impulsive Grooming: Wash/dry face, Minimal assistance,  Sitting Upper Body Bathing: Moderate assistance Lower Body Bathing: Maximal assistance Upper Body Dressing : Moderate assistance Lower Body Dressing: Maximal assistance Toilet Transfer: Moderate assistance, Stand-pivot, Cueing for sequencing, Cueing for safety Toilet Transfer Details (indicate cue type and reason): pt completed x2 stand pivots to [facility] with MOD A, pt requried step by step cues to intiiate pivotal steps to [facility]. pt required cues for hand placement and tacile cues to bend at hips to sit on seat Toileting- Clothing Manipulation and Hygiene: Sit to/from stand, Total assistance, Bed level Toileting - Clothing Manipulation Details (indicate cue type and reason): after first BM pt requried total A for posterior pericare in standing, however after second BM deferred pericare to bed level d/t pt fatigue and no +2 assist available. Functional mobility during ADLs: Moderate assistance, Cueing for sequencing, Cueing for safety General ADL Comments: pt present with impaired balance, cognitive deficits, decreased activity tolerance and fatigue  Cognition: Cognition Overall Cognitive Status: Impaired/Different from baseline Orientation Level: Oriented X4 Cognition Arousal/Alertness: Awake/alert Behavior During Therapy: Flat affect Overall Cognitive Status: Impaired/Different from baseline Area of Impairment: Attention, Following commands, Safety/judgement, Awareness, Problem solving Orientation Level: Disoriented to, Time (stated today was Thursday March 13, 2020) Current Attention Level: Sustained Memory: Decreased short-term memory Following Commands: Follows one step commands consistently, Follows one step commands with increased time Safety/Judgement: Decreased awareness of safety, Decreased awareness of deficits Awareness: Emergent Problem Solving: Slow processing, Decreased initiation, Difficulty sequencing, Requires verbal cues, Requires tactile cues General Comments: Pt slow and  requiring step-by-step cues to sequence and perform all tasks safely. Pt unaware of deficits as he tends to push towards his L in standing and reports feeling like he is falling to his R.   Blood pressure 120/68, pulse 78, temperature 98.5 F (36.9 C), temperature source Oral, resp. rate 18, height 5\' 11"  (1.803 m), weight 59 kg, SpO2 100 %. Physical Exam Vitals and nursing note reviewed.  Constitutional:      General: He is not in acute distress.    Appearance: He is cachectic.     Comments: Somnolent- --needed sternal rubs for one word answers and briefly opened eyes to command. He kept eyes closed for most of exam and made occasional moaning sounds.   HENT:     Head: Normocephalic and atraumatic.     Right Ear: External ear normal.     Left Ear: External ear normal.     Nose: Nose normal.  Eyes:     General:        Right eye: No discharge.        Left eye: No discharge.     Comments: Keeps eyes closed  Cardiovascular:     Rate and Rhythm: Normal rate and regular rhythm.  Pulmonary:     Effort: Pulmonary effort is normal. No respiratory distress.  Breath sounds: No stridor.  Abdominal:     General: Abdomen is flat. Bowel sounds are normal. There is no distension.  Musculoskeletal:     Cervical back: Normal range of motion and neck supple.     Comments: Pain with attempts at knee extension/flexion.  Left hands with edematous discolored dorsal digits.   Skin:    Comments: Bilateral knees with foam dressings covering ruptured blisters.   Neurological:     Mental Status: He is oriented to person, place, and time. He is lethargic.     Comments: Somnolent but aroused briefly to sternal rubs. He was able to state name as "Shirlee More" and answer orientation questions with choice of two. Did not open eyes or attempt to interact Motor: Limited due to participation  Psychiatric:     Comments: Unable to assess due to somnolence     Results for orders placed or performed during the  hospital encounter of 02/28/20 (from the past 48 hour(s))  Glucose, capillary     Status: Abnormal   Collection Time: 03/03/20 12:27 PM  Result Value Ref Range   Glucose-Capillary 134 (H) 70 - 99 mg/dL    Comment: Glucose reference range applies only to samples taken after fasting for at least 8 hours.   Comment 1 Notify RN    Comment 2 Document in Chart   Glucose, capillary     Status: Abnormal   Collection Time: 03/03/20  5:00 PM  Result Value Ref Range   Glucose-Capillary 108 (H) 70 - 99 mg/dL    Comment: Glucose reference range applies only to samples taken after fasting for at least 8 hours.   Comment 1 Notify RN    Comment 2 Document in Chart   Glucose, capillary     Status: Abnormal   Collection Time: 03/03/20  9:13 PM  Result Value Ref Range   Glucose-Capillary 255 (H) 70 - 99 mg/dL    Comment: Glucose reference range applies only to samples taken after fasting for at least 8 hours.   Comment 1 Notify RN    Comment 2 Document in Chart   Glucose, capillary     Status: Abnormal   Collection Time: 03/04/20  6:15 AM  Result Value Ref Range   Glucose-Capillary 127 (H) 70 - 99 mg/dL    Comment: Glucose reference range applies only to samples taken after fasting for at least 8 hours.  Basic metabolic panel     Status: Abnormal   Collection Time: 03/04/20 11:19 AM  Result Value Ref Range   Sodium 141 135 - 145 mmol/L   Potassium 3.6 3.5 - 5.1 mmol/L   Chloride 109 98 - 111 mmol/L   CO2 21 (L) 22 - 32 mmol/L   Glucose, Bld 142 (H) 70 - 99 mg/dL    Comment: Glucose reference range applies only to samples taken after fasting for at least 8 hours.   BUN 19 8 - 23 mg/dL   Creatinine, Ser 1.61 0.61 - 1.24 mg/dL   Calcium 8.7 (L) 8.9 - 10.3 mg/dL   GFR, Estimated >09 >60 mL/min    Comment: (NOTE) Calculated using the CKD-EPI Creatinine Equation (2021)    Anion gap 11 5 - 15    Comment: Performed at [facility], [address], [place], [place] [address]  CBC with  Differential/Platelet     Status: Abnormal   Collection Time: 03/04/20 11:19 AM  Result Value Ref Range   WBC 13.1 (H) 4.0 - 10.5 K/uL  RBC 3.52 (L) 4.22 - 5.81 MIL/uL   Hemoglobin 11.1 (L) 13.0 - 17.0 g/dL   HCT 65.7 (L) 39 - 52 %   MCV 93.5 80.0 - 100.0 fL   MCH 31.5 26.0 - 34.0 pg   MCHC 33.7 30.0 - 36.0 g/dL   RDW 84.6 96.2 - 95.2 %   Platelets 209 150 - 400 K/uL   nRBC 0.0 0.0 - 0.2 %   Neutrophils Relative % 81 %   Neutro Abs 10.6 (H) 1.7 - 7.7 K/uL   Lymphocytes Relative 6 %   Lymphs Abs 0.8 0.7 - 4.0 K/uL   Monocytes Relative 12 %   Monocytes Absolute 1.6 (H) 0.1 - 1.0 K/uL   Eosinophils Relative 0 %   Eosinophils Absolute 0.0 0.0 - 0.5 K/uL   Basophils Relative 0 %   Basophils Absolute 0.0 0.0 - 0.1 K/uL   Immature Granulocytes 1 %   Abs Immature Granulocytes 0.10 (H) 0.00 - 0.07 K/uL    Comment: Performed at [facility], [address], [place], [place] [address]  Magnesium     Status: Abnormal   Collection Time: 03/04/20 11:19 AM  Result Value Ref Range   Magnesium 1.4 (L) 1.7 - 2.4 mg/dL    Comment: Performed at [facility], [address] [place], [place] [address]  Phosphorus     Status: None   Collection Time: 03/04/20 11:19 AM  Result Value Ref Range   Phosphorus 2.7 2.5 - 4.6 mg/dL    Comment: Performed at [facility], [address], [place], [place] [address]  CK     Status: None   Collection Time: 03/04/20 11:19 AM  Result Value Ref Range   Total CK 343 49.0 - 397.0 U/L    Comment: Performed at [facility], [address], [place], [place] [address]  Glucose, capillary     Status: Abnormal   Collection Time: 03/04/20 12:34 PM  Result Value Ref Range   Glucose-Capillary 123 (H) 70 - 99 mg/dL    Comment: Glucose reference range applies only to samples taken after fasting for at least 8 hours.   Comment 1 Notify RN    Comment 2 Document in Chart   Glucose, capillary     Status: Abnormal   Collection Time: 03/04/20  5:27  PM  Result Value Ref Range   Glucose-Capillary 218 (H) 70 - 99 mg/dL    Comment: Glucose reference range applies only to samples taken after fasting for at least 8 hours.   Comment 1 Notify RN    Comment 2 Document in Chart   Urinalysis, Routine w reflex microscopic Urine, Clean Catch     Status: Abnormal   Collection Time: 03/04/20  5:30 PM  Result Value Ref Range   Color, Urine AMBER (A) YELLOW    Comment: BIOCHEMICALS MAY BE AFFECTED BY COLOR   APPearance CLOUDY (A) CLEAR   Specific Gravity, Urine 1.020 1.005 - 1.030   pH 6.0 5.0 - 8.0   Glucose, UA NEGATIVE NEGATIVE mg/dL   Hgb urine dipstick SMALL (A) NEGATIVE   Bilirubin Urine NEGATIVE NEGATIVE   Ketones, ur NEGATIVE NEGATIVE mg/dL   Protein, ur 30 (A) NEGATIVE mg/dL   Nitrite NEGATIVE NEGATIVE   Leukocytes,Ua LARGE (A) NEGATIVE   RBC / HPF 11-20 0 - 5 RBC/hpf   WBC, UA 21-50 0 - 5 WBC/hpf   Bacteria, UA MANY (A) NONE SEEN   Mucus PRESENT  Comment: Performed at [facility], [address], [place], [place] [address]  Glucose, capillary     Status: None   Collection Time: 03/04/20  9:04 PM  Result Value Ref Range   Glucose-Capillary 84 70 - 99 mg/dL    Comment: Glucose reference range applies only to samples taken after fasting for at least 8 hours.  Basic metabolic panel     Status: Abnormal   Collection Time: 03/05/20  3:56 AM  Result Value Ref Range   Sodium 142 135 - 145 mmol/L   Potassium 3.8 3.5 - 5.1 mmol/L   Chloride 109 98 - 111 mmol/L   CO2 21 (L) 22 - 32 mmol/L   Glucose, Bld 77 70 - 99 mg/dL    Comment: Glucose reference range applies only to samples taken after fasting for at least 8 hours.   BUN 20 8 - 23 mg/dL   Creatinine, Ser 8.67 (H) 0.61 - 1.24 mg/dL   Calcium 8.6 (L) 8.9 - 10.3 mg/dL   GFR, Estimated 54 (L) >60 mL/min    Comment: (NOTE) Calculated using the CKD-EPI Creatinine Equation (2021)    Anion gap 12 5 - 15    Comment: Performed at [facility], [address]  [place], [place] [address]  CBC with Differential/Platelet     Status: Abnormal   Collection Time: 03/05/20  3:56 AM  Result Value Ref Range   WBC 8.9 4.0 - 10.5 K/uL   RBC 3.69 (L) 4.22 - 5.81 MIL/uL   Hemoglobin 11.9 (L) 13.0 - 17.0 g/dL   HCT 93.2 (L) 39 - 52 %   MCV 96.7 80.0 - 100.0 fL   MCH 32.2 26.0 - 34.0 pg   MCHC 33.3 30.0 - 36.0 g/dL   RDW 67.1 24.5 - 80.9 %   Platelets 216 150 - 400 K/uL   nRBC 0.0 0.0 - 0.2 %   Neutrophils Relative % 72 %   Neutro Abs 6.5 1.7 - 7.7 K/uL   Lymphocytes Relative 15 %   Lymphs Abs 1.3 0.7 - 4.0 K/uL   Monocytes Relative 12 %   Monocytes Absolute 1.0 0.1 - 1.0 K/uL   Eosinophils Relative 0 %   Eosinophils Absolute 0.0 0.0 - 0.5 K/uL   Basophils Relative 0 %   Basophils Absolute 0.0 0.0 - 0.1 K/uL   Immature Granulocytes 1 %   Abs Immature Granulocytes 0.05 0.00 - 0.07 K/uL    Comment: Performed at [facility], [address] [place], [place] [address]  Lactic acid, plasma     Status: None   Collection Time: 03/05/20  3:56 AM  Result Value Ref Range   Lactic Acid, Venous 1.8 0.5 - 1.9 mmol/L    Comment: Performed at [facility], [address] [place], [place] [address]  Procalcitonin     Status: None   Collection Time: 03/05/20  3:56 AM  Result Value Ref Range   Procalcitonin 6.53 ng/mL    Comment:        Interpretation: PCT > 2 ng/mL: Systemic infection (sepsis) is likely, unless other causes are known. (NOTE)       Sepsis PCT Algorithm           Lower Respiratory Tract                                      Infection PCT  Algorithm    ----------------------------     ----------------------------         PCT < 0.25 ng/mL                PCT < 0.10 ng/mL          Strongly encourage             Strongly discourage   discontinuation of antibiotics    initiation of antibiotics    ----------------------------     -----------------------------       PCT 0.25 - 0.50 ng/mL            PCT 0.10 - 0.25 ng/mL               OR        >80% decrease in PCT            Discourage initiation of                                            antibiotics      Encourage discontinuation           of antibiotics    ----------------------------     -----------------------------         PCT >= 0.50 ng/mL              PCT 0.26 - 0.50 ng/mL               AND       <80% decrease in PCT              Encourage initiation of                                             antibiotics       Encourage continuation           of antibiotics    ----------------------------     -----------------------------        PCT >= 0.50 ng/mL                  PCT > 0.50 ng/mL               AND         increase in PCT                  Strongly encourage                                      initiation of antibiotics    Strongly encourage escalation           of antibiotics                                     -----------------------------                                           PCT <= 0.25 ng/mL  OR                                        > 80% decrease in PCT                                      Discontinue / Do not initiate                                             antibiotics  Performed at [facility], [address], [place], [place] [address]   Magnesium     Status: None   Collection Time: 03/05/20  3:56 AM  Result Value Ref Range   Magnesium 1.7 1.7 - 2.4 mg/dL    Comment: Performed at [facility], [address], [place], [place] [address]  Phosphorus     Status: None   Collection Time: 03/05/20  3:56 AM  Result Value Ref Range   Phosphorus 3.8 2.5 - 4.6 mg/dL    Comment: Performed at [facility], [address], [place], [place] [address]  Glucose, capillary     Status: None   Collection Time: 03/05/20  6:19 AM  Result Value Ref Range   Glucose-Capillary 76 70 - 99 mg/dL    Comment: Glucose reference range applies only to samples taken after fasting for at least 8  hours.   Comment 1 Notify RN    Comment 2 Document in Chart   CBC     Status: Abnormal   Collection Time: 03/05/20  7:30 AM  Result Value Ref Range   WBC 8.9 4.0 - 10.5 K/uL   RBC 3.60 (L) 4.22 - 5.81 MIL/uL   Hemoglobin 11.2 (L) 13.0 - 17.0 g/dL   HCT 33.0 (L) 39 - 52 %   MCV 96.1 80.0 - 100.0 fL   MCH 31.1 26.0 - 34.0 pg   MCHC 32.4 30.0 - 36.0 g/dL   RDW 07.6 22.6 - 33.3 %   Platelets 201 150 - 400 K/uL   nRBC 0.0 0.0 - 0.2 %    Comment: Performed at [facility] Lab, [address], [place], [place] [address]  Comprehensive metabolic panel     Status: Abnormal   Collection Time: 03/05/20  7:30 AM  Result Value Ref Range   Sodium 142 135 - 145 mmol/L   Potassium 3.8 3.5 - 5.1 mmol/L   Chloride 108 98 - 111 mmol/L   CO2 23 22 - 32 mmol/L   Glucose, Bld 103 (H) 70 - 99 mg/dL    Comment: Glucose reference range applies only to samples taken after fasting for at least 8 hours.   BUN 20 8 - 23 mg/dL   Creatinine, Ser 5.63 (H) 0.61 - 1.24 mg/dL   Calcium 8.8 (L) 8.9 - 10.3 mg/dL   Total Protein 5.8 (L) 6.5 - 8.1 g/dL   Albumin 3.0 (L) 3.5 - 5.0 g/dL   AST 41 15 - 41 U/L   ALT 31 0 - 44 U/L   Alkaline Phosphatase 79 38 - 126 U/L   Total Bilirubin 0.7 0.3 - 1.2 mg/dL   GFR, Estimated 47 (L) >60 mL/min  Comment: (NOTE) Calculated using the CKD-EPI Creatinine Equation (2021)    Anion gap 11 5 - 15    Comment: Performed at [facility], [address], [place], [place] [address]  Glucose, capillary     Status: None   Collection Time: 03/05/20  8:11 AM  Result Value Ref Range   Glucose-Capillary 85 70 - 99 mg/dL    Comment: Glucose reference range applies only to samples taken after fasting for at least 8 hours.   DG Chest Port 1 View  Result Date: 03/05/2020 CLINICAL DATA:  Shortness of breath. EXAM: PORTABLE CHEST 1 VIEW COMPARISON:  March 04, 2020. FINDINGS: The heart size and mediastinal contours are within normal limits. Both lungs are clear. No pneumothorax  or pleural effusion is noted. Sternotomy wires are noted. The visualized skeletal structures are unremarkable. IMPRESSION: No active disease. Electronically Signed   By: Lupita Raider M.D.   On: 03/05/2020 08:46   DG CHEST PORT 1 VIEW  Result Date: 03/04/2020 CLINICAL DATA:  Fevers EXAM: PORTABLE CHEST 1 VIEW COMPARISON:  03/02/2020 FINDINGS: Cardiac shadow is stable. Postsurgical changes are again seen. Lungs are well aerated bilaterally. No focal infiltrate is seen. Postsurgical changes in the right shoulder are noted. Contrast is again seen in the colon. IMPRESSION: No acute abnormality noted. Electronically Signed   By: Alcide Clever M.D.   On: 03/04/2020 16:32       Medical Problem List and Plan: 1. 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    -patient may shower  -ELOS/Goals: 22-27 days/min a  Admit to CIR 2.  Antithrombotics: -DVT/anticoagulation:  Pharmaceutical: Other (comment)--Eliquis.   -antiplatelet therapy: N/A--has been off ASA/Brilinta.  3. Chronic HA/Cervicalgia/Pain Management: Was on flexeril prn PTA. Will monitor N/A  Monitor with increased exertion 4. Mood: LCSW to follow for evaluation and support.   -antipsychotic agents: Monitor for now.  5. Neuropsych: This patient is not capable of making decisions on his own behalf. 6. Skin/Wound Care: Routine pressure relief measures. Protein supplement due to evidence of malnutrition.  7. Fluids/Electrolytes/Nutrition: Monitor I/Os.   CMP ordered for tomorrow a.m.  8. Gastroenteritis: Had multiple episodes of N/V on 11/09 with 4 incontinent stools and low grade fever. Monitor for GI virus. 9. HTN: Monitor BP tid--educate on importance of compliance. SBP goal 130-150  Monitor with  increased exertion. 10. ICM/Heart failure with persistent apical thrombus: Monitor for signs and symptoms of fluid overload. Daily weights. Continue Eliquis.  11. A fib: Monitor HR tid--continue Lanoxin and Eliquis.   Monitor with increased activity 12. Acute on chronic renal failure: Likely due to gastroenteritis. SCr 1.55-->1.3-->1.53.   Will recheck lytes in am.   CMP ordered for tomorrow. 13. Electrolytes abnormalities: Resolved post supplementation--recheck magnesium/potassium levels in am.  14. Anemia: Recheck iron levels.    CBC ordered for tomorrow a.m. 15. T2DM: Hgb A1C-6.8.  Will monitor BS ac/hs. Discontinue metformin due to CKD. Add CM restrictions to diet.   Monitor with increased mobility 16. Aspiration Pneumonitis: Started on Augmentin 11/10 due to concerns of infection.   Jacquelynn Cree, PA-C 03/05/2020  I have personally performed a face to face diagnostic evaluation, including, but not limited to relevant history and physical exam findings, of this patient and developed relevant assessment and plan.  Additionally, I have reviewed and  concur with the physician assistant's documentation above.  Maryla MorrowAnkit Cozette Braggs, MD, ABPMR

## 2020-03-04 NOTE — Progress Notes (Addendum)
Patient had 2x nausea episodes but didn't ned antinausea. Cold rug given and made comfortable in bed. HR in 110-122's.

## 2020-03-04 NOTE — Progress Notes (Signed)
Physical Therapy Treatment Patient Details Name: [name] MRN: [id number] DOB: Dec 10, 1940 Today's Date: 03/04/2020    History of Present Illness Pt is a 79 year old male who presented with L-sided weakness, L facial droop, and confusion after being on the ground at home. He did not receive tPA and NIH 14-16. 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 It also showed "Associated petechial hemorrhage at the right basal ganglia without frank hemorrhagic transformation or significant regional mass effect", per MRI report. Medical hx of: CAD, DM, and ischemic cardiomyopathy.    PT Comments    Pt has demonstrated a decline in function this date compared to his previous session. This is most likely secondary to him not feeling well as the pt reported that he has been febrile and having bouts of emesis. He required maxA for all bed mobility and transfers and was unable to successfully follow commands to attempt LAQ and marching while sitting on the EOB. Will continue to follow acutely. Current recommendations remain appropriate based on assumption that his current decline in status is momentary.  Follow Up Recommendations  CIR;Supervision/Assistance - 24 hour     Equipment Recommendations  Rolling walker with 5" wheels;3in1 (PT);Wheelchair (measurements PT);Wheelchair cushion (measurements PT) (TBD as pt progresses)    Recommendations for Other Services Rehab consult     Precautions / Restrictions Precautions Precautions: Fall Restrictions Weight Bearing Restrictions: No    Mobility  Bed Mobility Overal bed mobility: Needs Assistance Bed Mobility: Supine to Sit;Sit to Supine     Supine to sit: HOB elevated;Max assist Sit to supine: Max assist;HOB elevated   General bed mobility comments: Extra time and maxA to initiate and complete transitions in bed as pt was lathergic and would not initiate when  cued.  Transfers Overall transfer level: Needs assistance Equipment used: 1 person hand held assist Transfers: Sit to/from Stand Sit to Stand: Max assist         General transfer comment: STS from EOB 2x with B knee block and assistance under buttocks to come to stand. Cues for hand placement, with pt assisting in initiating when provided cues to rock anteriorly and count down from '3'.  Ambulation/Gait             General Gait Details: Unable to ambulate safely this date   Stairs             Wheelchair Mobility    Modified Rankin (Stroke Patients Only) Modified Rankin (Stroke Patients Only) Pre-Morbid Rankin Score: No symptoms Modified Rankin: Severe disability     Balance Overall balance assessment: Needs assistance Sitting-balance support: Bilateral upper extremity supported;Feet supported Sitting balance-Leahy Scale: Poor Sitting balance - Comments: Maintains static sitting balance EOB with B UE support and modA, with pt displaying posterior lean despite max cues to correct. Postural control: Posterior lean Standing balance support: Single extremity supported Standing balance-Leahy Scale: Zero Standing balance comment: B knee block and R HHA provided along with assistance under buttocks to maintain static standing position x2 bouts lasting ~30-60 seconds each with maxA. Cued pt to tuck buttocks and extend knees, with min activation noted.                            Cognition Arousal/Alertness: Lethargic Behavior During Therapy: Flat affect Overall Cognitive Status: Impaired/Different from baseline Area of Impairment: Attention;Following commands  Current Attention Level: Sustained   Following Commands: Follows one step commands inconsistently;Follows one step commands with increased time       General Comments: Pt lethargic and required extra time and extensive assistance for all tasks this date.        Exercises      General Comments        Pertinent Vitals/Pain Pain Assessment: Faces Faces Pain Scale: No hurt Pain Intervention(s): Limited activity within patient's tolerance;Monitored during session;Repositioned    Home Living                      Prior Function            PT Goals (current goals can now be found in the care plan section) Acute Rehab PT Goals Patient Stated Goal: not explicitly stated this date PT Goal Formulation: With patient Time For Goal Achievement: 03/14/20 Potential to Achieve Goals: Good Progress towards PT goals: Not progressing toward goals - comment (pt not feeling well and is febrile this date)    Frequency    Min 4X/week      PT Plan Current plan remains appropriate    Co-evaluation              AM-PAC PT "6 Clicks" Mobility   Outcome Measure  Help needed turning from your back to your side while in a flat bed without using bedrails?: A Lot Help needed moving from lying on your back to sitting on the side of a flat bed without using bedrails?: A Lot Help needed moving to and from a bed to a chair (including a wheelchair)?: A Lot Help needed standing up from a chair using your arms (e.g., wheelchair or bedside chair)?: A Lot Help needed to walk in hospital room?: A Lot Help needed climbing 3-5 steps with a railing? : A Lot 6 Click Score: 12    End of Session Equipment Utilized During Treatment: Gait belt Activity Tolerance: Patient limited by fatigue;Treatment limited secondary to medical complications (Comment) (febrile this date, per nurse) Patient left: with call bell/phone within reach;in bed;with bed alarm set Nurse Communication: Mobility status PT Visit Diagnosis: Unsteadiness on feet (R26.81);Other abnormalities of gait and mobility (R26.89);Muscle weakness (generalized) (M62.81);Difficulty in walking, not elsewhere classified (R26.2);Other symptoms and signs involving the nervous system (R29.898);Hemiplegia  and hemiparesis Hemiplegia - Right/Left: Left Hemiplegia - dominant/non-dominant: Non-dominant Hemiplegia - caused by: Cerebral infarction     Time: 9604-5409 PT Time Calculation (min) (ACUTE ONLY): 18 min  Charges:  $Therapeutic Activity: 8-22 mins                     Raymond Gurney, PT, DPT Acute Rehabilitation Services  Pager: [phone] Office: [phone]    Jewel Baize 03/04/2020, 3:56 PM

## 2020-03-04 NOTE — Progress Notes (Signed)
Pt experiencing nausea and vomiting combined. RN notified MD of pt change in condition. MD gave order for 4 mg Zofran IV STAT. Medication administered. Pt resting with HOB elevated. Staff will continue to monitor.

## 2020-03-04 NOTE — Progress Notes (Signed)
PROGRESS NOTE  [name]  DOB: May 08, 1940  PCP: Deatra Randall, MD OBS:[id number]  DOA: 02/28/2020  LOS: 5 days   Chief Complaint  Patient presents with  . Weakness    Brief narrative: [name] is a 79 y.o. male who presented to the ED on 02/28/2020 left-sided weakness. 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 Patient lives alone, with neighbors stopping by everyone as well.   Patient had not been seen by neighbors for 3 days thus a well check was made by police.  Police found him on the floor with house disheveled, patient drowsy, confused on the floor with left facial droop and left sided weakness.  It was not clear how long he was on the floor for. EMS brought to the ED as code stroke.    In the ED, patient was drowsy, left suggested dehydration.   CT head, MRI brain showed acute to subacute ischemic right MCA territory infarct involving the right basal ganglia and overlying right frontal lobe associated with petechial hemorrhage at the right basal ganglia.  He also has has chronic right cerebellar infarcts  MRA head and neck showed severe high-grade stenosis within the proximal right M1 segment.   Patient was admitted to hospitalist service. Neurology consultation was obtained. Per PT recommendation, patient was prepared to discharge to CIR.    However, today 11/9, patient had a temperature of 100 degrees this morning, he is mildly tachycardic, and has a rising WBC from 6 to 13. Discharge held.  Subjective: Patient was seen and examined this morning.   Looks tired and sick this morning.  Vomited once this morning.  Remains nauseous.  Temperature 100 this a.m.  Heart rate more than 100. WBC count up to 13.1.  Assessment/Plan: #Low-grade fever, leukocytosis -Follow with vomiting, lethargy. -Currently not on antibiotics. -Obtain chest x-ray,  urinalysis. -Continue symptomatic management for now.  If temperature recurrence more than 100.5, will obtain blood culture and start on empiric antibiotics. Recent Labs  Lab 02/28/20 1123 02/29/20 0206 03/01/20 0057 03/02/20 0451 03/04/20 1119  WBC 8.6 8.4 9.3 4.5 13.1*   Acute to subacute CVA right MCA, with left-sided paresis and aphasia  Chronic right cerebellar infarcts. -Neurology consult appreciated. -Etiology of acute infarct: thrombotic in the setting of significant intracranial atherosclerosis versus cardioembolic origin in the setting of LV apical thrombus  -Imaging findings as above. -Echo with EF 45 to 50%, cannot rule out LV thrombus. -Prior to admission, patient was on aspirin, Eliquis but suspect noncompliance. Eliquis has been resumed. Continue statin. -A1c 6.1. Lipid panel with LDL 117. -PT/OT eval obtained.  CIR recommended  Hypertensive urgency -Blood pressure was elevated up to 190s on presentation. -Likely secondary to noncompliance. -Currently blood pressure is controlled on hydralazine as needed only. -Long-term BP goal on 130-150 given right MCA high-grade stenosis versus occlusion.  Persistent LV thrombus Chronic systolic CHF -Echocardiogram with a EF 45 to 50% and cannot rule out LV thrombus.  Patient has a history of LV thrombus and noncompliant to Eliquis at home. -Eliquis resumed.   Cardiac arrhythmia -Per records from [facility], patient has history of tachybradycardia syndrome/possible A fib/ A flutter -Supposed to be on digoxin and metoprolol at home.  Suspected noncompliance. -Digoxin level on admission was subtherapeutic -Digoxin has been resumed.  Heart rate controlled at this time.  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  IIDM -A1c 6.8 on 11/4  -continue sliding scale insulin.  Currently metformin on hold. -Creatinine improved.  I would resume Metformin at discharge. Recent Labs  Lab 03/03/20 1227  03/03/20 1700 03/03/20 2113 03/04/20 0615 03/04/20 1234  GLUCAP 134* 108* 255* 127* 123*   CKD stage II -Baseline creatinine 1.2-1.6 -Patient looked clinically dehydrated on admission, adequately hydrated.  Creatinine improved. Recent Labs    02/28/20 1123 02/28/20 1409 02/29/20 0206 03/01/20 0057 03/02/20 0451 03/03/20 0107 03/04/20 1119  BUN 39* 41* 32* 26* 15 18 19   CREATININE 1.55* 1.40* 1.35* 1.42* 1.36* 1.21 1.22   Hypernatremia -Sodium level was elevated to 149.  -Improved with D5 half NS.  Currently normal. Recent Labs  Lab 02/28/20 1123 02/28/20 1409 02/29/20 0206 03/01/20 0057 03/02/20 0451 03/03/20 0107 03/04/20 1119  NA 149* 147* 149* 143 141 140 141   Hypokalemia/hypomagnesemia -Magnesium level low at 1.4 this morning.  Replacement ordered. Recent Labs  Lab 02/29/20 0206 03/01/20 0057 03/02/20 0451 03/03/20 0107 03/04/20 1119  K 3.7 3.8 3.1* 3.3* 3.6  MG  --   --   --   --  1.4*  PHOS  --   --   --   --  2.7   Rhabdomyolysis -Mild elevation in CK likely because of prolonged downtime -Improved with hydration. Recent Labs  Lab 02/28/20 1123 03/01/20 0057 03/02/20 0451 03/03/20 0107 03/04/20 1119  CKTOTAL 745* 277 767* 433* 343   Mobility: PT eval obtained.  CIR recommended. Code Status:   Code Status: Full Code  Nutritional status: Body mass index is 18.13 kg/m.     Diet Order            DIET - DYS 1 Room service appropriate? Yes; Fluid consistency: Nectar Thick  Diet effective now                 DVT prophylaxis: Heparin drip   Antimicrobials:  None Fluid: Not on IV fluid Consultants: Neurology Family Communication:  None at bedside  Status is: Inpatient  Remains inpatient appropriate because: Patient is having low-grade fever.  Dispo: The patient is from: Home              Anticipated d/c is to: CIR              Anticipated d/c date is: If no recurrence of fever, may be able to discharge to [facility] tomorrow               Patient currently is medically stable to d/c.   Infusions:  . magnesium sulfate bolus IVPB      Scheduled Meds: . apixaban  5 mg Oral BID  . digoxin  0.125 mg Oral Daily  . insulin aspart  0-9 Units Subcutaneous TID WC  . mupirocin cream   Topical BID    Antimicrobials: Anti-infectives (From admission, onward)   None      PRN meds: acetaminophen **OR** acetaminophen (TYLENOL) oral liquid 160 mg/5 mL **OR** acetaminophen, hydrALAZINE, ondansetron (ZOFRAN) IV   Objective: Vitals:   03/04/20 1227 03/04/20 1513  BP: 135/61 124/76  Pulse: 90 94  Resp: 18 19  Temp: 99.5 F (37.5 C) 99.5 F (37.5 C)  SpO2: 98% 97%   No intake or output data in the 24 hours ending 03/04/20 1529 Filed Weights   02/28/20 1217  Weight: 59 kg   Weight change:  Body mass index is 18.13 kg/m.   Physical Exam: General exam: Looks tired,  sick today.  Does not complain. Skin: No rashes, lesions or ulcers. HEENT: Atraumatic, normocephalic, supple neck, no obvious bleeding Lungs: Clear to auscultation bilaterally CVS: Regular rate and rhythm, no murmur GI/Abd soft, nontender, nondistended, bowels are present CNS: Alert, awake, knows he is in the hospital.  Not in distress.  Not restless or agitated.  Able to follow motor commands. Psychiatry: Depressed look Extremities: No pedal edema, no calf tenderness  Data Review: I have personally reviewed the laboratory data and studies available.  Recent Labs  Lab 02/28/20 1123 02/28/20 1123 02/28/20 1409 02/29/20 0206 03/01/20 0057 03/02/20 0451 03/04/20 1119  WBC 8.6  --   --  8.4 9.3 4.5 13.1*  NEUTROABS 6.4  --   --   --   --   --  10.6*  HGB 12.1*   < > 11.6* 12.3* 11.7* 11.4* 11.1*  HCT 38.1*   < > 34.0* 37.5* 35.6* 34.0* 32.9*  MCV 100.8*  --   --  98.7 100.3* 96.3 93.5  PLT 219  --   --  204 149* 184 209   < > = values in this interval not displayed.   Recent Labs  Lab 02/29/20 0206 03/01/20 0057 03/02/20 0451 03/03/20 0107  03/04/20 1119  NA 149* 143 141 140 141  K 3.7 3.8 3.1* 3.3* 3.6  CL 113* 111 106 110 109  CO2 22 17* 22 20* 21*  GLUCOSE 89 80 116* 135* 142*  BUN 32* 26* 15 18 19   CREATININE 1.35* 1.42* 1.36* 1.21 1.22  CALCIUM 9.6 8.8* 8.6* 8.6* 8.7*  MG  --   --   --   --  1.4*  PHOS  --   --   --   --  2.7    F/u labs ordered  Signed, , MD [facility] 03/04/2020

## 2020-03-04 NOTE — Progress Notes (Signed)
Inpatient Rehabilitation-Admissions Coordinator \  Received insurance approval for admit to CIR. However, due to elevated temp and jump in WBC's this AM, we will hold admission today. Will continue to follow for medical readiness.   Cheri Rous, OTR/L  Rehab Admissions Coordinator  [phone] 03/04/2020 1:12 PM

## 2020-03-05 ENCOUNTER — Inpatient Hospital Stay (HOSPITAL_COMMUNITY): Payer: Medicare PPO

## 2020-03-05 ENCOUNTER — Encounter (HOSPITAL_COMMUNITY): Payer: Self-pay | Admitting: Physical Medicine & Rehabilitation

## 2020-03-05 ENCOUNTER — Other Ambulatory Visit: Payer: Self-pay

## 2020-03-05 ENCOUNTER — Inpatient Hospital Stay (HOSPITAL_COMMUNITY)
Admission: RE | Admit: 2020-03-05 | Discharge: 2020-04-24 | DRG: 091 | Disposition: A | Discharge status: Skilled Nursing Facility | Payer: Medicare PPO | Source: Intra-hospital | Attending: Physical Medicine & Rehabilitation | Admitting: Physical Medicine & Rehabilitation

## 2020-03-05 DIAGNOSIS — E785 Hyperlipidemia, unspecified: Secondary | ICD-10-CM | POA: Diagnosis present

## 2020-03-05 DIAGNOSIS — R414 Neurologic neglect syndrome: Secondary | ICD-10-CM | POA: Diagnosis present

## 2020-03-05 DIAGNOSIS — F419 Anxiety disorder, unspecified: Secondary | ICD-10-CM | POA: Diagnosis present

## 2020-03-05 DIAGNOSIS — Z79899 Other long term (current) drug therapy: Secondary | ICD-10-CM

## 2020-03-05 DIAGNOSIS — S40212D Abrasion of left shoulder, subsequent encounter: Secondary | ICD-10-CM | POA: Diagnosis not present

## 2020-03-05 DIAGNOSIS — M79604 Pain in right leg: Secondary | ICD-10-CM | POA: Diagnosis not present

## 2020-03-05 DIAGNOSIS — G8929 Other chronic pain: Secondary | ICD-10-CM | POA: Diagnosis present

## 2020-03-05 DIAGNOSIS — R001 Bradycardia, unspecified: Secondary | ICD-10-CM | POA: Diagnosis not present

## 2020-03-05 DIAGNOSIS — I69354 Hemiplegia and hemiparesis following cerebral infarction affecting left non-dominant side: Secondary | ICD-10-CM

## 2020-03-05 DIAGNOSIS — Z8614 Personal history of Methicillin resistant Staphylococcus aureus infection: Secondary | ICD-10-CM

## 2020-03-05 DIAGNOSIS — I69319 Unspecified symptoms and signs involving cognitive functions following cerebral infarction: Secondary | ICD-10-CM | POA: Diagnosis not present

## 2020-03-05 DIAGNOSIS — E1149 Type 2 diabetes mellitus with other diabetic neurological complication: Secondary | ICD-10-CM | POA: Diagnosis not present

## 2020-03-05 DIAGNOSIS — R451 Restlessness and agitation: Secondary | ICD-10-CM | POA: Diagnosis not present

## 2020-03-05 DIAGNOSIS — Z9119 Patient's noncompliance with other medical treatment and regimen: Secondary | ICD-10-CM

## 2020-03-05 DIAGNOSIS — S80212D Abrasion, left knee, subsequent encounter: Secondary | ICD-10-CM

## 2020-03-05 DIAGNOSIS — Z7982 Long term (current) use of aspirin: Secondary | ICD-10-CM

## 2020-03-05 DIAGNOSIS — J449 Chronic obstructive pulmonary disease, unspecified: Secondary | ICD-10-CM | POA: Diagnosis present

## 2020-03-05 DIAGNOSIS — E43 Unspecified severe protein-calorie malnutrition: Secondary | ICD-10-CM | POA: Insufficient documentation

## 2020-03-05 DIAGNOSIS — I255 Ischemic cardiomyopathy: Secondary | ICD-10-CM | POA: Diagnosis present

## 2020-03-05 DIAGNOSIS — R32 Unspecified urinary incontinence: Secondary | ICD-10-CM | POA: Diagnosis present

## 2020-03-05 DIAGNOSIS — N179 Acute kidney failure, unspecified: Secondary | ICD-10-CM | POA: Diagnosis present

## 2020-03-05 DIAGNOSIS — B9561 Methicillin susceptible Staphylococcus aureus infection as the cause of diseases classified elsewhere: Secondary | ICD-10-CM | POA: Diagnosis not present

## 2020-03-05 DIAGNOSIS — M542 Cervicalgia: Secondary | ICD-10-CM

## 2020-03-05 DIAGNOSIS — W1830XD Fall on same level, unspecified, subsequent encounter: Secondary | ICD-10-CM | POA: Diagnosis not present

## 2020-03-05 DIAGNOSIS — G9341 Metabolic encephalopathy: Secondary | ICD-10-CM | POA: Diagnosis present

## 2020-03-05 DIAGNOSIS — I69322 Dysarthria following cerebral infarction: Secondary | ICD-10-CM

## 2020-03-05 DIAGNOSIS — M47812 Spondylosis without myelopathy or radiculopathy, cervical region: Secondary | ICD-10-CM | POA: Diagnosis present

## 2020-03-05 DIAGNOSIS — I69391 Dysphagia following cerebral infarction: Secondary | ICD-10-CM

## 2020-03-05 DIAGNOSIS — R1312 Dysphagia, oropharyngeal phase: Secondary | ICD-10-CM | POA: Diagnosis present

## 2020-03-05 DIAGNOSIS — Z20822 Contact with and (suspected) exposure to covid-19: Secondary | ICD-10-CM | POA: Diagnosis present

## 2020-03-05 DIAGNOSIS — I340 Nonrheumatic mitral (valve) insufficiency: Secondary | ICD-10-CM | POA: Diagnosis not present

## 2020-03-05 DIAGNOSIS — Z23 Encounter for immunization: Secondary | ICD-10-CM

## 2020-03-05 DIAGNOSIS — K529 Noninfective gastroenteritis and colitis, unspecified: Secondary | ICD-10-CM | POA: Diagnosis present

## 2020-03-05 DIAGNOSIS — I63511 Cerebral infarction due to unspecified occlusion or stenosis of right middle cerebral artery: Secondary | ICD-10-CM | POA: Diagnosis present

## 2020-03-05 DIAGNOSIS — D649 Anemia, unspecified: Secondary | ICD-10-CM | POA: Diagnosis present

## 2020-03-05 DIAGNOSIS — I2510 Atherosclerotic heart disease of native coronary artery without angina pectoris: Secondary | ICD-10-CM | POA: Diagnosis present

## 2020-03-05 DIAGNOSIS — Z87891 Personal history of nicotine dependence: Secondary | ICD-10-CM

## 2020-03-05 DIAGNOSIS — I5022 Chronic systolic (congestive) heart failure: Secondary | ICD-10-CM

## 2020-03-05 DIAGNOSIS — R64 Cachexia: Secondary | ICD-10-CM | POA: Diagnosis present

## 2020-03-05 DIAGNOSIS — Z6820 Body mass index (BMI) 20.0-20.9, adult: Secondary | ICD-10-CM

## 2020-03-05 DIAGNOSIS — I10 Essential (primary) hypertension: Secondary | ICD-10-CM | POA: Diagnosis present

## 2020-03-05 DIAGNOSIS — R2689 Other abnormalities of gait and mobility: Secondary | ICD-10-CM | POA: Diagnosis present

## 2020-03-05 DIAGNOSIS — J690 Pneumonitis due to inhalation of food and vomit: Secondary | ICD-10-CM | POA: Diagnosis present

## 2020-03-05 DIAGNOSIS — I252 Old myocardial infarction: Secondary | ICD-10-CM

## 2020-03-05 DIAGNOSIS — R490 Dysphonia: Secondary | ICD-10-CM | POA: Diagnosis present

## 2020-03-05 DIAGNOSIS — Z951 Presence of aortocoronary bypass graft: Secondary | ICD-10-CM

## 2020-03-05 DIAGNOSIS — E1165 Type 2 diabetes mellitus with hyperglycemia: Secondary | ICD-10-CM | POA: Diagnosis not present

## 2020-03-05 DIAGNOSIS — I480 Paroxysmal atrial fibrillation: Secondary | ICD-10-CM

## 2020-03-05 DIAGNOSIS — I639 Cerebral infarction, unspecified: Secondary | ICD-10-CM | POA: Diagnosis not present

## 2020-03-05 DIAGNOSIS — I69320 Aphasia following cerebral infarction: Secondary | ICD-10-CM

## 2020-03-05 DIAGNOSIS — I69392 Facial weakness following cerebral infarction: Secondary | ICD-10-CM

## 2020-03-05 DIAGNOSIS — Z955 Presence of coronary angioplasty implant and graft: Secondary | ICD-10-CM

## 2020-03-05 DIAGNOSIS — R159 Full incontinence of feces: Secondary | ICD-10-CM | POA: Diagnosis present

## 2020-03-05 DIAGNOSIS — Z7984 Long term (current) use of oral hypoglycemic drugs: Secondary | ICD-10-CM

## 2020-03-05 DIAGNOSIS — R7881 Bacteremia: Secondary | ICD-10-CM | POA: Diagnosis present

## 2020-03-05 DIAGNOSIS — Z96611 Presence of right artificial shoulder joint: Secondary | ICD-10-CM | POA: Diagnosis present

## 2020-03-05 DIAGNOSIS — M79605 Pain in left leg: Secondary | ICD-10-CM | POA: Diagnosis not present

## 2020-03-05 DIAGNOSIS — E119 Type 2 diabetes mellitus without complications: Secondary | ICD-10-CM

## 2020-03-05 DIAGNOSIS — S80211D Abrasion, right knee, subsequent encounter: Secondary | ICD-10-CM

## 2020-03-05 DIAGNOSIS — Z5329 Procedure and treatment not carried out because of patient's decision for other reasons: Secondary | ICD-10-CM | POA: Diagnosis not present

## 2020-03-05 DIAGNOSIS — Z7902 Long term (current) use of antithrombotics/antiplatelets: Secondary | ICD-10-CM

## 2020-03-05 DIAGNOSIS — Z7901 Long term (current) use of anticoagulants: Secondary | ICD-10-CM

## 2020-03-05 HISTORY — DX: Essential (primary) hypertension: I10

## 2020-03-05 HISTORY — DX: Heart failure, unspecified: I50.9

## 2020-03-05 HISTORY — DX: Chronic kidney disease, unspecified: N18.9

## 2020-03-05 HISTORY — DX: Chronic obstructive pulmonary disease, unspecified: J44.9

## 2020-03-05 LAB — COMPREHENSIVE METABOLIC PANEL
ALT: 31 U/L (ref 0–44)
AST: 41 U/L (ref 15–41)
Albumin: 3 g/dL — ABNORMAL LOW (ref 3.5–5.0)
Alkaline Phosphatase: 79 U/L (ref 38–126)
Anion gap: 11 (ref 5–15)
BUN: 20 mg/dL (ref 8–23)
CO2: 23 mmol/L (ref 22–32)
Calcium: 8.8 mg/dL — ABNORMAL LOW (ref 8.9–10.3)
Chloride: 108 mmol/L (ref 98–111)
Creatinine, Ser: 1.53 mg/dL — ABNORMAL HIGH (ref 0.61–1.24)
GFR, Estimated: 47 mL/min — ABNORMAL LOW (ref >60)
Glucose, Bld: 103 mg/dL — ABNORMAL HIGH (ref 70–99)
Potassium: 3.8 mmol/L (ref 3.5–5.1)
Sodium: 142 mmol/L (ref 135–145)
Total Bilirubin: 0.7 mg/dL (ref 0.3–1.2)
Total Protein: 5.8 g/dL — ABNORMAL LOW (ref 6.5–8.1)

## 2020-03-05 LAB — CBC WITH DIFFERENTIAL/PLATELET
Abs Immature Granulocytes: 0.05 10*3/uL (ref 0.00–0.07)
Basophils Absolute: 0 10*3/uL (ref 0.0–0.1)
Basophils Relative: 0 %
Eosinophils Absolute: 0 10*3/uL (ref 0.0–0.5)
Eosinophils Relative: 0 %
HCT: 35.7 % — ABNORMAL LOW (ref 39.0–52.0)
Hemoglobin: 11.9 g/dL — ABNORMAL LOW (ref 13.0–17.0)
Immature Granulocytes: 1 %
Lymphocytes Relative: 15 %
Lymphs Abs: 1.3 10*3/uL (ref 0.7–4.0)
MCH: 32.2 pg (ref 26.0–34.0)
MCHC: 33.3 g/dL (ref 30.0–36.0)
MCV: 96.7 fL (ref 80.0–100.0)
Monocytes Absolute: 1 10*3/uL (ref 0.1–1.0)
Monocytes Relative: 12 %
Neutro Abs: 6.5 10*3/uL (ref 1.7–7.7)
Neutrophils Relative %: 72 %
Platelets: 216 10*3/uL (ref 150–400)
RBC: 3.69 MIL/uL — ABNORMAL LOW (ref 4.22–5.81)
RDW: 12.6 % (ref 11.5–15.5)
WBC: 8.9 10*3/uL (ref 4.0–10.5)
nRBC: 0 % (ref 0.0–0.2)

## 2020-03-05 LAB — CBC
HCT: 34.6 % — ABNORMAL LOW (ref 39.0–52.0)
Hemoglobin: 11.2 g/dL — ABNORMAL LOW (ref 13.0–17.0)
MCH: 31.1 pg (ref 26.0–34.0)
MCHC: 32.4 g/dL (ref 30.0–36.0)
MCV: 96.1 fL (ref 80.0–100.0)
Platelets: 201 10*3/uL (ref 150–400)
RBC: 3.6 MIL/uL — ABNORMAL LOW (ref 4.22–5.81)
RDW: 12.5 % (ref 11.5–15.5)
WBC: 8.9 10*3/uL (ref 4.0–10.5)
nRBC: 0 % (ref 0.0–0.2)

## 2020-03-05 LAB — GLUCOSE, CAPILLARY
Glucose-Capillary: 76 mg/dL (ref 70–99)
Glucose-Capillary: 85 mg/dL (ref 70–99)
Glucose-Capillary: 153 mg/dL — ABNORMAL HIGH (ref 70–99)
Glucose-Capillary: 146 mg/dL — ABNORMAL HIGH (ref 70–99)
Glucose-Capillary: 273 mg/dL — ABNORMAL HIGH (ref 70–99)

## 2020-03-05 LAB — BASIC METABOLIC PANEL
Anion gap: 12 (ref 5–15)
BUN: 20 mg/dL (ref 8–23)
CO2: 21 mmol/L — ABNORMAL LOW (ref 22–32)
Calcium: 8.6 mg/dL — ABNORMAL LOW (ref 8.9–10.3)
Chloride: 109 mmol/L (ref 98–111)
Creatinine, Ser: 1.36 mg/dL — ABNORMAL HIGH (ref 0.61–1.24)
GFR, Estimated: 54 mL/min — ABNORMAL LOW (ref >60)
Glucose, Bld: 77 mg/dL (ref 70–99)
Potassium: 3.8 mmol/L (ref 3.5–5.1)
Sodium: 142 mmol/L (ref 135–145)

## 2020-03-05 LAB — PROCALCITONIN: Procalcitonin: 6.53 ng/mL

## 2020-03-05 LAB — PHOSPHORUS: Phosphorus: 3.8 mg/dL (ref 2.5–4.6)

## 2020-03-05 LAB — LACTIC ACID, PLASMA: Lactic Acid, Venous: 1.8 mmol/L (ref 0.5–1.9)

## 2020-03-05 LAB — MAGNESIUM: Magnesium: 1.7 mg/dL (ref 1.7–2.4)

## 2020-03-05 MED ORDER — INSULIN ASPART 100 UNIT/ML ~~LOC~~ SOLN
0.0000 [IU] | Freq: Three times a day (TID) | SUBCUTANEOUS | Status: DC
  Administered 2020-03-05: 1 [IU] via SUBCUTANEOUS
  Administered 2020-03-06 – 2020-03-07 (×3): 2 [IU] via SUBCUTANEOUS
  Administered 2020-03-07: 1 [IU] via SUBCUTANEOUS
  Administered 2020-03-08: 2 [IU] via SUBCUTANEOUS
  Administered 2020-03-08: 3 [IU] via SUBCUTANEOUS
  Administered 2020-03-09 – 2020-03-11 (×3): 2 [IU] via SUBCUTANEOUS
  Administered 2020-03-11: 3 [IU] via SUBCUTANEOUS
  Administered 2020-03-12: 5 [IU] via SUBCUTANEOUS
  Administered 2020-03-13: 3 [IU] via SUBCUTANEOUS
  Administered 2020-03-13: 1 [IU] via SUBCUTANEOUS
  Administered 2020-03-13 – 2020-03-14 (×2): 2 [IU] via SUBCUTANEOUS
  Administered 2020-03-14: 1 [IU] via SUBCUTANEOUS
  Administered 2020-03-14: 3 [IU] via SUBCUTANEOUS
  Administered 2020-03-15: 2 [IU] via SUBCUTANEOUS
  Administered 2020-03-15: 3 [IU] via SUBCUTANEOUS
  Administered 2020-03-15: 1 [IU] via SUBCUTANEOUS
  Administered 2020-03-16: 3 [IU] via SUBCUTANEOUS
  Administered 2020-03-16: 1 [IU] via SUBCUTANEOUS
  Administered 2020-03-16 – 2020-03-17 (×3): 2 [IU] via SUBCUTANEOUS
  Administered 2020-03-17 – 2020-03-18 (×2): 1 [IU] via SUBCUTANEOUS
  Administered 2020-03-18: 5 [IU] via SUBCUTANEOUS
  Administered 2020-03-19: 1 [IU] via SUBCUTANEOUS
  Administered 2020-03-19 – 2020-03-20 (×2): 2 [IU] via SUBCUTANEOUS
  Administered 2020-03-20 – 2020-03-24 (×6): 1 [IU] via SUBCUTANEOUS
  Administered 2020-03-25: 5 [IU] via SUBCUTANEOUS
  Administered 2020-03-25: 2 [IU] via SUBCUTANEOUS
  Administered 2020-03-26 (×2): 1 [IU] via SUBCUTANEOUS
  Administered 2020-03-27: 3 [IU] via SUBCUTANEOUS
  Administered 2020-03-28: 1 [IU] via SUBCUTANEOUS
  Administered 2020-03-28 – 2020-03-30 (×3): 2 [IU] via SUBCUTANEOUS
  Administered 2020-03-30 – 2020-04-07 (×9): 1 [IU] via SUBCUTANEOUS
  Administered 2020-04-07: 2 [IU] via SUBCUTANEOUS
  Administered 2020-04-08 – 2020-04-09 (×2): 1 [IU] via SUBCUTANEOUS
  Administered 2020-04-09 – 2020-04-10 (×2): 2 [IU] via SUBCUTANEOUS
  Administered 2020-04-10 – 2020-04-11 (×2): 1 [IU] via SUBCUTANEOUS
  Administered 2020-04-11 – 2020-04-12 (×2): 2 [IU] via SUBCUTANEOUS
  Administered 2020-04-12: 1 [IU] via SUBCUTANEOUS
  Administered 2020-04-13 – 2020-04-15 (×4): 2 [IU] via SUBCUTANEOUS
  Administered 2020-04-15 – 2020-04-18 (×3): 1 [IU] via SUBCUTANEOUS
  Administered 2020-04-18: 2 [IU] via SUBCUTANEOUS
  Administered 2020-04-20: 3 [IU] via SUBCUTANEOUS
  Administered 2020-04-20: 2 [IU] via SUBCUTANEOUS
  Administered 2020-04-21: 1 [IU] via SUBCUTANEOUS
  Administered 2020-04-23: 3 [IU] via SUBCUTANEOUS

## 2020-03-05 MED ORDER — AMOXICILLIN-POT CLAVULANATE 400-57 MG/5ML PO SUSR
500.0000 mg | Freq: Two times a day (BID) | ORAL | Status: DC
  Administered 2020-03-05: 500 mg via ORAL
  Filled 2020-03-05 (×2): qty 6.3

## 2020-03-05 MED ORDER — PROCHLORPERAZINE MALEATE 5 MG PO TABS
5.0000 mg | ORAL_TABLET | Freq: Four times a day (QID) | ORAL | Status: DC | PRN
  Administered 2020-04-16: 10 mg via ORAL
  Filled 2020-03-05 (×2): qty 2

## 2020-03-05 MED ORDER — JUVEN PO PACK
1.0000 | PACK | Freq: Two times a day (BID) | ORAL | Status: DC
  Administered 2020-03-06 – 2020-04-20 (×60): 1 via ORAL
  Filled 2020-03-05 (×56): qty 1

## 2020-03-05 MED ORDER — DIGOXIN 125 MCG PO TABS
0.1250 mg | ORAL_TABLET | Freq: Every day | ORAL | Status: DC
  Administered 2020-03-06 – 2020-04-05 (×26): 0.125 mg via ORAL
  Filled 2020-03-05 (×36): qty 1

## 2020-03-05 MED ORDER — PROCHLORPERAZINE 25 MG RE SUPP: 12.5000 mg | Freq: Four times a day (QID) | RECTAL | Status: DC | PRN

## 2020-03-05 MED ORDER — ALUM & MAG HYDROXIDE-SIMETH 200-200-20 MG/5ML PO SUSP: 30.0000 mL | ORAL | Status: DC | PRN

## 2020-03-05 MED ORDER — APIXABAN 5 MG PO TABS
5.0000 mg | ORAL_TABLET | Freq: Two times a day (BID) | ORAL | Status: DC
  Administered 2020-03-05 – 2020-04-24 (×98): 5 mg via ORAL
  Filled 2020-03-05 (×101): qty 1

## 2020-03-05 MED ORDER — SENNOSIDES-DOCUSATE SODIUM 8.6-50 MG PO TABS
1.0000 | ORAL_TABLET | Freq: Every evening | ORAL | Status: DC | PRN
  Administered 2020-03-30 – 2020-04-18 (×3): 1 via ORAL
  Filled 2020-03-05 (×3): qty 1

## 2020-03-05 MED ORDER — GUAIFENESIN-DM 100-10 MG/5ML PO SYRP
5.0000 mL | ORAL_SOLUTION | Freq: Four times a day (QID) | ORAL | Status: DC | PRN
  Administered 2020-03-12 – 2020-03-28 (×2): 10 mL via ORAL
  Administered 2020-04-20: 5 mL via ORAL
  Filled 2020-03-05 (×3): qty 10

## 2020-03-05 MED ORDER — INSULIN ASPART 100 UNIT/ML ~~LOC~~ SOLN: SUBCUTANEOUS | 11 refills | Status: DC

## 2020-03-05 MED ORDER — INFLUENZA VAC A&B SA ADJ QUAD 0.5 ML IM PRSY
0.5000 mL | PREFILLED_SYRINGE | INTRAMUSCULAR | Status: AC
  Administered 2020-03-06: 0.5 mL via INTRAMUSCULAR
  Filled 2020-03-05: qty 0.5

## 2020-03-05 MED ORDER — MUPIROCIN CALCIUM 2 % EX CREA
TOPICAL_CREAM | Freq: Two times a day (BID) | CUTANEOUS | Status: DC
  Administered 2020-03-05 – 2020-04-13 (×14): 1 via TOPICAL
  Filled 2020-03-05 (×3): qty 15

## 2020-03-05 MED ORDER — INSULIN ASPART 100 UNIT/ML ~~LOC~~ SOLN
0.0000 [IU] | Freq: Every day | SUBCUTANEOUS | Status: DC
  Administered 2020-03-05: 3 [IU] via SUBCUTANEOUS
  Administered 2020-03-06: 2 [IU] via SUBCUTANEOUS
  Administered 2020-03-07 – 2020-03-12 (×2): 3 [IU] via SUBCUTANEOUS
  Administered 2020-03-14 – 2020-03-16 (×2): 2 [IU] via SUBCUTANEOUS
  Administered 2020-03-17: 3 [IU] via SUBCUTANEOUS
  Administered 2020-03-19 – 2020-04-18 (×4): 2 [IU] via SUBCUTANEOUS

## 2020-03-05 MED ORDER — PNEUMOCOCCAL VAC POLYVALENT 25 MCG/0.5ML IJ INJ
0.5000 mL | INJECTION | INTRAMUSCULAR | Status: AC
  Administered 2020-03-06: 0.5 mL via INTRAMUSCULAR
  Filled 2020-03-05: qty 0.5

## 2020-03-05 MED ORDER — FLEET ENEMA 7-19 GM/118ML RE ENEM: 1.0000 | ENEMA | Freq: Once | RECTAL | Status: DC | PRN

## 2020-03-05 MED ORDER — PROCHLORPERAZINE EDISYLATE 10 MG/2ML IJ SOLN
5.0000 mg | Freq: Four times a day (QID) | INTRAMUSCULAR | Status: DC | PRN
  Filled 2020-03-05: qty 2

## 2020-03-05 MED ORDER — BISACODYL 10 MG RE SUPP
10.0000 mg | Freq: Every day | RECTAL | Status: DC | PRN
  Administered 2020-04-05 – 2020-04-08 (×2): 10 mg via RECTAL
  Filled 2020-03-05 (×4): qty 1

## 2020-03-05 MED ORDER — DIPHENHYDRAMINE HCL 12.5 MG/5ML PO ELIX
12.5000 mg | ORAL_SOLUTION | Freq: Four times a day (QID) | ORAL | Status: DC | PRN
  Administered 2020-03-16 – 2020-04-18 (×15): 25 mg via ORAL
  Administered 2020-04-21: 12.5 mg via ORAL
  Administered 2020-04-22: 25 mg via ORAL
  Filled 2020-03-05 (×7): qty 10
  Filled 2020-03-05: qty 0
  Filled 2020-03-05 (×6): qty 10
  Filled 2020-03-05: qty 0
  Filled 2020-03-05 (×5): qty 10

## 2020-03-05 MED ORDER — VITAMIN C 250 MG PO TABS
250.0000 mg | ORAL_TABLET | Freq: Every day | ORAL | Status: DC
  Administered 2020-03-06 – 2020-04-24 (×48): 250 mg via ORAL
  Filled 2020-03-05 (×51): qty 1

## 2020-03-05 MED ORDER — TRAZODONE HCL 50 MG PO TABS
25.0000 mg | ORAL_TABLET | Freq: Every evening | ORAL | Status: DC | PRN
  Administered 2020-03-06 (×2): 50 mg via ORAL
  Administered 2020-03-09: 25 mg via ORAL
  Administered 2020-03-09 – 2020-04-16 (×37): 50 mg via ORAL
  Filled 2020-03-05 (×41): qty 1

## 2020-03-05 MED ORDER — AMOXICILLIN-POT CLAVULANATE 400-57 MG/5ML PO SUSR
500.0000 mg | Freq: Two times a day (BID) | ORAL | 0 refills | Status: DC
Start: 2020-03-05 — End: 2020-04-24

## 2020-03-05 MED ORDER — ACETAMINOPHEN 325 MG PO TABS
325.0000 mg | ORAL_TABLET | ORAL | Status: DC | PRN
  Administered 2020-03-06 – 2020-03-30 (×48): 650 mg via ORAL
  Administered 2020-03-30: 325 mg via ORAL
  Administered 2020-03-30 (×3): 650 mg via ORAL
  Administered 2020-03-31: 325 mg via ORAL
  Administered 2020-03-31 – 2020-04-10 (×16): 650 mg via ORAL
  Administered 2020-04-10: 325 mg via ORAL
  Administered 2020-04-10 – 2020-04-22 (×19): 650 mg via ORAL
  Filled 2020-03-05 (×96): qty 2

## 2020-03-05 NOTE — Evaluation (Signed)
Speech Language Pathology Assessment and Plan  Patient Details  Name: [name] MRN: [id number] Date of Birth: 1941-03-11  SLP Diagnosis: Dysarthria;Cognitive Impairments;Dysphagia  Rehab Potential: Good ELOS: 2.5 weeks    Today's Date: 03/06/2020 SLP Individual Time: 4268-3419 SLP Individual Time Calculation (min): 24 min   Hospital Problem: Principal Problem:   MSSA bacteremia Active Problems:   Type II diabetes mellitus ([place])   Acute right MCA stroke [facility])  Past Medical History:  Past Medical History:  Diagnosis Date  . CHF (congestive heart failure) ([place])   . Chronic kidney disease   . COPD (chronic obstructive pulmonary disease) ([place])   . Coronary artery disease    a. 2007 dLAD 100->med rx;  b. 2011 PCI: 80 LAD (Promus 2.75x18); c. 03/2015 MV: predom fixed inf/antapical defefcts w/ ischemia, depressed EF;  c. 04/2015 NSTEMI/PCI: LM nl,LAD 40p ISR, 100d (2.5x20 Promus DES), LCX 46m(3.0x20 Promus DES), RCA 30p, 430m70d.   . [place]  . Dyslipidemia   . Hx MRSA infection   . Hypertension   . Ischemic cardiomyopathy    a. 04/2015 Echo: EF 25-30%, apical AK, Gr 2 DD, triv AI, mild MR, mildly dil LA, PASP 3540m.  . Tobacco abuse    Past Surgical History:  Past Surgical History:  Procedure Laterality Date  . CARDIAC CATHETERIZATION N/A 05/16/2015   Procedure: Left Heart Cath and Coronary Angiography;  Surgeon: MicSherren MochaD;  Location: [facility];  Service: Cardiovascular;  Laterality: N/A;  . CARDIAC CATHETERIZATION N/A 05/16/2015   Procedure: Coronary Stent Intervention;  Surgeon: MicSherren MochaD;  Location: [facility] [place] [facility];  Service: Cardiovascular;  Laterality: N/A;  . 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 History taken from chart review due to cognition. He was found to have elevated BP with rhabdomyolysis and hypernatremia. 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  MRA brain revealed severe partially occlusive and/or recanalized thrombus in right M1 segment, moderate left P2 stenosis and MRA neck limited due to inability to tolerate exam. Echocardiogram with ejection fraction of 50% with elongated echodensity inferior apex consistent with mural thrombus with hypokinesis.  Per records has history of noncompliance [facility] on IV heparin and Dr. Xu Erlinda Honglt that stroke cardioembolic due to noncompliance with Eliquis.  Long term BP goal 130-150 give high grade R-MCA stenosis v/s occlusion. Agitation and confusion improving with use of prn Haldol.  Electrolyte abnormalities resolving with hydration but he developed GI distress with lethargy and decline overall mobility on 03/04/2020 due to low-grade fever, multiple episodes of N/V as well as multiple stools. He was found to have leukocytosis with WBC 13.1 with elevated procalcitonin levels.  He has had reports of SOB as well as coughing with meals and Augmentin added today due to concerns of aspiration pneumonitis. Hospital course further complicated by post  stroke dysphagia, started on dysphagia 1 nectar thick diet. Pt was admitted to CIR 03/05/20 and SLP evaluations were completed 03/06/20 with results as follows:  Pt presents with moderately severe oropharyngeal dysphagia. Per last MBSS, thin liquids result in  aspiration prior to and during the swallow with strong reflexive cough response, although nectar liquids were penetrated, they were not aspirated. During thin trials today, he exhibited immediate explosive cough in 1 out of 2 cup sips, suspect due to delayed initiation of swallow sequence, as he orally held bolus for 3-5 seconds prior to attempting swallow. No overt s/sx aspiration noted with nectar. When consuming purees from breakfast tray, he required Max A cueing to take 1 bite at a time to avoid packing food into oral cavity - otherwise he fills mouth with PO and holds it. He consumed ~1/2 of his meal, and became increasingly fatigued, then orally held bolus and was not responsive to Max A multimodal cues to swallow or expectorate for safety. SLP set up suction and suctioned bolus from oral cavity. Recommend pt continue current diet but he may ONLY CONSUME [place]. Full supervision must ensure pt takes 1 bite at a time and clears oral cavity completely prior to next bite/sip and at end of meal. Be aware suctioning may be necessary for full oral clearance. ST will continue to monitor tolerance; if severe oral holding continues and inhibits PO intake, he may require temporary alternative means of nutrition. Pt also presents with moderate cognitive impairments and mild dysarthria. He was only oriented to self upon arrival, and with reduced short term recall of new functional information, functional basic problem solving, intellectual awareness, and attention to tasks. Although fluent, his speech was somewhat slowed with reduced vocal intensity and articulatory imprecision. Speech intelligibility was reduced to ~75% at the sentence level this morning. He also made comments that indicated confusion and potentially hallucinations suspect due to meds (ex: stated there was a cat at the end of his bed).  Recommend pt receive skilled ST while inpatient to address dysphagia, dysarthria, and cognitive  impairments in order to maximize his functional communication, diet safety and efficiency, and functional independence prior to discharge. Continue per current plan of care.    Skilled Therapeutic Interventions          Bedside swallow and cognitive-linguistic evaluations were administered and results were reviewed with pt (please see above for details regarding results).    SLP Assessment  Patient will need skilled Speech Lanaguage Pathology Services during CIR admission    Recommendations  SLP Diet Recommendations: Dysphagia 1 (Puree);Nectar Liquid Administration via: Cup Medication Administration: Crushed with puree Supervision: Patient able to self feed;Full supervision/cueing for compensatory strategies Compensations: Slow rate;Small sips/bites;Lingual sweep for clearance of pocketing;Other (Comment) (pt must take 1 bite at a time and clear it from mouth before adding more PO to reduce holding) Postural Changes and/or Swallow Maneuvers: Seated upright 90 degrees Oral Care Recommendations: Oral care BID Patient destination: Home Follow up Recommendations: Home Health SLP;24 hour supervision/assistance Equipment Recommended: None recommended by SLP    SLP Frequency 3 to 5 out of 7 days   SLP Duration  SLP Intensity  SLP Treatment/Interventions 2.5 weeks  Minumum of 1-2 x/day, 30 to 90 minutes  Cognitive remediation/compensation;Cueing hierarchy;Functional tasks;Patient/family education;Therapeutic Activities;Speech/Language facilitation;Dysphagia/aspiration precaution training;Internal/external aids    Pain Pain Assessment Pain Scale: Faces Pain Score: 0-No pain Faces Pain Scale: No hurt Pain Intervention(s): Medication (See eMAR)     SLP Evaluation Cognition Overall Cognitive  Status: Impaired/Different from baseline Arousal/Alertness: Lethargic Orientation Level: Oriented to person;Disoriented to place;Disoriented to situation Attention: Sustained Sustained  Attention: Impaired Sustained Attention Impairment: Verbal basic;Functional basic Memory: Impaired Memory Impairment: Decreased short term memory;Decreased recall of new information Decreased Short Term Memory: Functional basic Awareness: Impaired Awareness Impairment: Intellectual impairment Problem Solving: Impaired Problem Solving Impairment: Verbal basic;Functional basic Safety/Judgment: Impaired  Comprehension Auditory Comprehension Overall Auditory Comprehension: Appears within functional limits for tasks assessed Commands: Within Functional Limits Visual Recognition/Discrimination Discrimination: Not tested Reading Comprehension Reading Status: Not tested Expression Expression Primary Mode of Expression: Verbal Verbal Expression Overall Verbal Expression: Appears within functional limits for tasks assessed Written Expression Written Expression: Not tested Oral Motor Oral Motor/Sensory Function Overall Oral Motor/Sensory Function: Moderate impairment Facial ROM: Reduced left;Suspected CN VII (facial) dysfunction Facial Symmetry: Abnormal symmetry left;Suspected CN VII (facial) dysfunction Facial Strength: Suspected CN VII (facial) dysfunction Lingual ROM: Suspected CN XII (hypoglossal) dysfunction Lingual Symmetry: Suspected CN XII (hypoglossal) dysfunction Lingual Strength: Suspected CN XII (hypoglossal) dysfunction Motor Speech Overall Motor Speech: Impaired Respiration: Within functional limits Phonation: Low vocal intensity Resonance: Within functional limits Articulation: Impaired Level of Impairment: Word Intelligibility: Intelligibility reduced Word: 75-100% accurate Phrase: 75-100% accurate Sentence: 50-74% accurate Conversation: 50-74% accurate Motor Planning: Witnin functional limits Motor Speech Errors: Not applicable Effective Techniques: Increased vocal intensity;Over-articulate  Care Tool Care Tool Cognition Expression of Ideas and Wants  Expression of Ideas and Wants: Some difficulty - exhibits some difficulty with expressing needs and ideas (e.g, some words or finishing thoughts) or speech is not clear   Understanding Verbal and Non-Verbal Content Understanding Verbal and Non-Verbal Content: Sometimes understands - understands only basic conversations or simple, direct phrases. Frequently requires cues to understand   Memory/Recall Ability *first 3 days only Memory/Recall Ability *first 3 days only: None of the above were recalled     Intelligibility: Intelligibility reduced Word: 75-100% accurate Phrase: 75-100% accurate Sentence: 50-74% accurate Conversation: 50-74% accurate  Bedside Swallowing Assessment General Date of Onset: 02/29/20 Previous Swallow Assessment: 11/5 and 11/6 - MBSS Diet Prior to this Study: Dysphagia 1 (puree);Nectar-thick liquids Temperature Spikes Noted: No Respiratory Status: Room air History of Recent Intubation: No Behavior/Cognition: Cooperative;Requires cueing;Impulsive;Lethargic/Drowsy Oral Cavity - Dentition: Missing dentition Self-Feeding Abilities: Able to feed self Patient Positioning: Upright in bed Baseline Vocal Quality: Normal Volitional Cough: Weak Volitional Swallow: Able to elicit  Oral Care Assessment Does patient have any of the following "high(er) risk" factors?: None of the above Does patient have any of the following "at risk" factors?: Other - dysphagia;Diet - patient on thickened liquids Patient is AT RISK: Order set for Adult Oral Care Protocol initiated -  "At Risk Patients" option selected (see row information) Patient is LOW RISK: Follow universal precautions (see row information) Ice Chips Ice chips: Impaired Oral Phase Impairments: Reduced labial seal;Reduced lingual movement/coordination Thin Liquid Thin Liquid: Impaired Presentation: Cup;Self Fed Oral Phase Impairments: Reduced labial seal Oral Phase Functional Implications: Oral holding Pharyngeal   Phase Impairments: Cough - Immediate Nectar Thick Nectar Thick Liquid: Within functional limits Presentation: Self Fed;Cup Honey Thick Honey Thick Liquid: Not tested Puree Puree: Impaired Presentation: Self Fed;Spoon Oral Phase Impairments: Reduced labial seal;Reduced lingual movement/coordination Oral Phase Functional Implications: Left lateral sulci pocketing;Left anterior spillage;Prolonged oral transit;Oral holding Solid Solid: Not tested BSE Assessment Risk for Aspiration Impact on safety and function: Moderate aspiration risk Other Related Risk Factors: Decreased respiratory status;Lethargy;Cognitive impairment  Short Term Goals: Week 1: SLP Short Term Goal 1 (Week 1): Pt will consume current diet  with efficient mastication and oral clearance and minimal overt s/sx aspiration with Mod A cueing to reduce oral holding and clear left pocketing. SLP Short Term Goal 2 (Week 1): Pt will consume therapeutic trials of thin H2O with minimal overt s/sx aspiration across 3 sessions to demonstrate readiness for repeat MBSS. SLP Short Term Goal 3 (Week 1): Pt will sustain attention to tasks and functional topics of conversation for 5-7 minute intervals with Min A cues for redirection. SLP Short Term Goal 4 (Week 1): Pt will demonstrate ability to problem solve functional familiar situations with Mod A verbal/visual cues. SLP Short Term Goal 5 (Week 1): Pt will recall new and/or daily information related to therapies with Mod A cues for use of aids or strategies. SLP Short Term Goal 6 (Week 1): Pt will utilize compensatory strategies for speech intelligibility (increased vocal intensity and overarticulation) at the sentence level with Mod A cues.  Refer to Care Plan for Long Term Goals  Recommendations for other services: None   Discharge Criteria: Patient will be discharged from SLP if patient refuses treatment 3 consecutive times without medical reason, if treatment goals not met, if there is  a change in medical status, if patient makes no progress towards goals or if patient is discharged from hospital.  The above assessment, treatment plan, treatment alternatives and goals were discussed and mutually agreed upon: by patient  Arbutus Leas 03/06/2020, 12:43 PM

## 2020-03-05 NOTE — Progress Notes (Signed)
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  Per chart review patient lives alone.  Two-level home bed and bath main level 2 steps to entry.  Independent with a straight point cane.  Presented 02/28/2020 with left-sided weakness.  Cranial CT/MRI scan showed subacute appearing right MCA infarction with confluent involvement of the basal ganglia.  Mild regional mass-effect.  No hemorrhage.  MRA of head and neck showed severe high-grade stenosis or possibly partially occlusive and/or recanalized thrombus within the proximal right M1 segment.  Patient did not receive TPA.  Admission chemistries alcohol negative, sodium 149, glucose 175, BUN 39, creatinine 1.55, CK 745, urine drug screen negative, hemoglobin A1c 6.8.  Echocardiogram pending.  Neurology follow-up currently ongoing awaiting plan on anticoagulation.  Therapy evaluations completed with recommendations of physical medicine rehab consult.     Review of Systems  Constitutional: Negative for chills and fever.  HENT: Negative for hearing loss.   Eyes: Negative for blurred vision and double vision.  Respiratory: Negative for cough and shortness of breath.    Cardiovascular: Negative for chest pain and palpitations.  Gastrointestinal: Positive for constipation. Negative for heartburn, nausea and vomiting.  Genitourinary: Negative for dysuria, flank pain and hematuria.  Musculoskeletal: Positive for joint pain and myalgias.  Skin: Negative for rash.  All other systems reviewed and are negative.       Past Medical History:  Diagnosis Date  . Coronary artery disease      a. 2007 dLAD 100->med rx;  b. 2011 PCI: 80 LAD (Promus 2.75x18); c. 03/2015 MV: predom fixed inf/antapical defefcts w/ ischemia, depressed EF;  c. 04/2015 NSTEMI/PCI: LM nl,LAD 40p ISR, 100d (2.5x20 Promus DES), LCX 47m (3.0x20 Promus DES), RCA 30p, 34m, 70d.   . Diabetes mellitus without complication (HCC)    . Dyslipidemia    . Hx MRSA infection    . Ischemic cardiomyopathy      a. 04/2015 Echo: EF 25-30%, apical AK, Gr 2 DD, triv AI, mild MR, mildly dil LA, PASP 97m.  . Tobacco abuse           Past Surgical History:  Procedure Laterality Date  . CARDIAC CATHETERIZATION N/A 05/16/2015    Procedure: Left Heart Cath and Coronary Angiography;  Surgeon: 05/2015, MD;  Location: [facility] INVASIVE CV LAB;  Service: Cardiovascular;  Laterality: N/A;  . CARDIAC CATHETERIZATION N/A 05/16/2015    Procedure: Coronary Stent Intervention;  Surgeon: Tonny Bollman, MD;  Location: [facility] INVASIVE CV LAB;  Service: Cardiovascular;  Laterality: N/A;  . TOTAL SHOULDER REPLACEMENT        Right    No family history on file. Social History:  reports that he has quit  smoking. He has never used smokeless tobacco. He reports that he does not drink alcohol and does not use drugs. Allergies: No Known Allergies       Medications Prior to Admission  Medication Sig Dispense Refill  . aspirin 81 MG tablet Take 81 mg by mouth daily.       Marland Kitchen atorvastatin (LIPITOR) 80 MG tablet Take 1 tablet (80 mg total) by mouth daily at 6 PM. PLEASE CONTACT OFFICE FOR ADDITIONAL REFILLS FINAL WARNING 30 tablet 0  .  cyclobenzaprine (FLEXERIL) 5 MG tablet Take 5 mg by mouth at bedtime as needed for muscle spasms.       . digoxin (LANOXIN) 0.125 MG tablet Take 0.125 mg by mouth daily.       Marland Kitchen ELIQUIS 5 MG TABS tablet Take 5 mg by mouth 2 (two) times daily.      . isosorbide mononitrate (IMDUR) 30 MG 24 hr tablet Take 30 mg by mouth daily.      . metFORMIN (GLUCOPHAGE) 500 MG tablet Take 500 mg by mouth 2 (two) times daily.      . nitroGLYCERIN (NITROSTAT) 0.4 MG SL tablet PLACE 1 TABLET UNDER THE TONGUE AS DIRECTED. (Patient taking differently: Place under the tongue every 5 (five) minutes as needed for chest pain (max 5 doses). ) 75 tablet 0  . BRILINTA 90 MG TABS tablet TAKE 1 TABLET (90 MG TOTAL) BY MOUTH 2 (TWO) TIMES DAILY. (Patient not taking: Reported on 02/28/2020) 60 tablet 0  . BRILINTA 90 MG TABS tablet TAKE 1 TABLET (90 MG TOTAL) BY MOUTH 2 (TWO) TIMES DAILY. **NEEDS OV** (Patient not taking: Reported on 02/28/2020) 60 tablet 0  . metoprolol succinate (TOPROL XL) 50 MG 24 hr tablet Take 1 tablet (50 mg total) by mouth daily. Take with or immediately following a meal. (Patient not taking: Reported on 02/28/2020) 30 tablet 11  . ramipril (ALTACE) 10 MG capsule Take 1 capsule (10 mg total) by mouth daily. (Patient not taking: Reported on 02/28/2020) 30 capsule 11      Home: Home Living Family/patient expects to be discharged to:: Private residence Living Arrangements: Alone Available Help at Discharge: Neighbor, Available 24 hours/day (per pt, but per chart appears to be intermittent check up) Type of Home: House Home Access: Stairs to enter [employer] of Steps: 2 Entrance Stairs-Rails: None Home Layout: Two level, Able to live on main level with bedroom/bathroom Alternate Level Stairs-Number of Steps: 10 Alternate Level Stairs-Rails: Can reach both Bathroom Shower/Tub: Tub/shower unit, Engineer, building services: Standard Bathroom Accessibility: Yes Home Equipment: Grab bars - toilet,  Cane - single point, Wheelchair - manual (uses w/c in home, SPC in community)  Functional History: Prior Function Level of Independence: Independent with assistive device(s) Comments: Pt drives. Functional Status:  Mobility: Bed Mobility Overal bed mobility: Needs Assistance Bed Mobility: Supine to Sit, Sit to Supine Supine to sit: Min assist Sit to supine: Min assist General bed mobility comments: Pt able to initiate moving B LEs towards EOB and pulling on side of bed to assist with trunk ascension, but required TCs and VCs to manage L LE fully off EOB and to complete trunk ascension. MinA to manage L LE superiorly onto bed. Transfers Overall transfer level: Needs assistance Equipment used: 1 person hand held assist Transfers: Sit to/from Stand Sit to Stand: Mod assist General transfer comment: ModA and extra time to power up to stand, cues for proper hand placement as he tried to grab therapit's leg or arm to  pull up. Ambulation/Gait Ambulation/Gait assistance: Mod assist Gait Distance (Feet): 5 Feet Assistive device: 1 person hand held assist Gait Pattern/deviations: Step-to pattern, Decreased step length - left, Decreased stance time - left, Decreased stance time - right, Decreased stride length, Shuffle, Narrow base of support General Gait Details: Requires TCs and VCs to correct decreased L stance time and L step length as he tends to shuffle his B feet, esp demonstrating difficulty with advancing L LE. R UE support on therapist, modA to maintain balance. Gait velocity: decreased Gait velocity interpretation: <1.31 ft/sec, indicative of household ambulator   ADL:   Cognition: Cognition Overall Cognitive Status: Impaired/Different from baseline Orientation Level: Oriented to person, Disoriented to situation, Disoriented to time, Disoriented to place Cognition Arousal/Alertness: Awake/alert Behavior During Therapy: Agitated, Impulsive Overall Cognitive Status:  Impaired/Different from baseline Area of Impairment: Attention, Memory, Following commands, Safety/judgement, Awareness, Problem solving Current Attention Level: Sustained Memory: Decreased recall of precautions, Decreased short-term memory Following Commands: Follows one step commands inconsistently, Follows one step commands with increased time Safety/Judgement: Decreased awareness of safety, Decreased awareness of deficits Awareness: Emergent Problem Solving: Slow processing, Decreased initiation, Difficulty sequencing, Requires verbal cues, Requires tactile cues General Comments: Pt fixated on wanting water this date and required repeated cues and continual conversation to educate pt on safety concerns with swallowing at this time. Required step-by-step cues to sequence mobility and to maintain safety. Pt attempts to grab therapist, non-aggresively, at times, and requires repeated cues to avoid this.   Blood pressure (!) 166/78, pulse 62, temperature 98.4 F (36.9 C), temperature source Axillary, resp. rate 18, height  (1.803 m), weight 59 kg, SpO2 100 %.  General: Very somnolent, No apparent distress HEENT: Head is normocephalic, atraumatic, PERRLA, EOMI, sclera anicteric, oral mucosa pink and moist, dentition intact, ext ear canals clear,  Neck: Supple without JVD or lymphadenopathy Heart: Reg rate and rhythm. No murmurs rubs or gallops Chest: CTA bilaterally without wheezes, rales, or rhonchi; no distress Abdomen: Soft, non-tender, non-distended, bowel sounds positive. Extremities: No clubbing, cyanosis, or edema. Pulses are 2+ Skin: Clean and intact without signs of breakdown Neuro: Patient is alert.  Left facial droop. Impaired tongue deviation. Mood is a bit flat but appropriate.  Oriented x3 and follows commands. Right sided strength 5/5. Left leg 5/5. Left arm 2/5 throughout.   Psych: Pt's affect is appropriate. Pt is cooperative   Lab Results Last 24 Hours       Results  for orders placed or performed during the hospital encounter of 02/28/20 (from the past 24 hour(s))  CBG monitoring, ED     Status: Abnormal    Collection Time: 02/28/20 11:21 AM  Result Value Ref Range    Glucose-Capillary 161 (H) 70 - 99 mg/dL  Ethanol     Status: None    Collection Time: 02/28/20 11:23 AM  Result Value Ref Range    Alcohol, Ethyl (B) <10 <10 mg/dL  Protime-INR     Status: None    Collection Time: 02/28/20 11:23 AM  Result Value Ref Range    Prothrombin Time 13.6 11.4 - 15.2 seconds    INR 1.1 0.8 - 1.2  APTT     Status: Abnormal    Collection Time: 02/28/20 11:23 AM  Result Value Ref Range    aPTT 23 (L) 24 - 36 seconds  CBC     Status: Abnormal    Collection Time: 02/28/20 11:23 AM  Result Value Ref Range    WBC 8.6 4.0 -  10.5 K/uL    RBC 3.78 (L) 4.22 - 5.81 MIL/uL    Hemoglobin 12.1 (L) 13.0 - 17.0 g/dL    HCT 46.5 (L) 39 - 52 %    MCV 100.8 (H) 80.0 - 100.0 fL    MCH 32.0 26.0 - 34.0 pg    MCHC 31.8 30.0 - 36.0 g/dL    RDW 03.5 46.5 - 68.1 %    Platelets 219 150 - 400 K/uL    nRBC 0.0 0.0 - 0.2 %  Differential     Status: None    Collection Time: 02/28/20 11:23 AM  Result Value Ref Range    Neutrophils Relative % 74 %    Neutro Abs 6.4 1.7 - 7.7 K/uL    Lymphocytes Relative 16 %    Lymphs Abs 1.3 0.7 - 4.0 K/uL    Monocytes Relative 9 %    Monocytes Absolute 0.8 0.1 - 1.0 K/uL    Eosinophils Relative 1 %    Eosinophils Absolute 0.0 0.0 - 0.5 K/uL    Basophils Relative 0 %    Basophils Absolute 0.0 0.0 - 0.1 K/uL    Immature Granulocytes 0 %    Abs Immature Granulocytes 0.03 0.00 - 0.07 K/uL  Comprehensive metabolic panel     Status: Abnormal    Collection Time: 02/28/20 11:23 AM  Result Value Ref Range    Sodium 149 (H) 135 - 145 mmol/L    Potassium 3.9 3.5 - 5.1 mmol/L    Chloride 112 (H) 98 - 111 mmol/L    CO2 21 (L) 22 - 32 mmol/L    Glucose, Bld 175 (H) 70 - 99 mg/dL    BUN 39 (H) 8 - 23 mg/dL    Creatinine, Ser 2.75 (H) 0.61 - 1.24  mg/dL    Calcium 9.7 8.9 - 17.0 mg/dL    Total Protein 6.6 6.5 - 8.1 g/dL    Albumin 3.7 3.5 - 5.0 g/dL    AST 36 15 - 41 U/L    ALT 26 0 - 44 U/L    Alkaline Phosphatase 84 38 - 126 U/L    Total Bilirubin 0.7 0.3 - 1.2 mg/dL    GFR, Estimated 46 (L) >60 mL/min    Anion gap 16 (H) 5 - 15  CK     Status: Abnormal    Collection Time: 02/28/20 11:23 AM  Result Value Ref Range    Total CK 745 (H) 49.0 - 397.0 U/L  Urine rapid drug screen (hosp performed)     Status: None    Collection Time: 02/28/20 12:11 PM  Result Value Ref Range    Opiates NONE DETECTED NONE DETECTED    Cocaine NONE DETECTED NONE DETECTED    Benzodiazepines NONE DETECTED NONE DETECTED    Amphetamines NONE DETECTED NONE DETECTED    Tetrahydrocannabinol NONE DETECTED NONE DETECTED    Barbiturates NONE DETECTED NONE DETECTED  Urinalysis, Routine w reflex microscopic Urine, Clean Catch     Status: Abnormal    Collection Time: 02/28/20 12:11 PM  Result Value Ref Range    Color, Urine YELLOW YELLOW    APPearance HAZY (A) CLEAR    Specific Gravity, Urine 1.027 1.005 - 1.030    pH 5.0 5.0 - 8.0    Glucose, UA NEGATIVE NEGATIVE mg/dL    Hgb urine dipstick SMALL (A) NEGATIVE    Bilirubin Urine NEGATIVE NEGATIVE    Ketones, ur 20 (A) NEGATIVE mg/dL    Protein, ur 017 (A) NEGATIVE mg/dL  Nitrite NEGATIVE NEGATIVE    Leukocytes,Ua NEGATIVE NEGATIVE    RBC / HPF 0-5 0 - 5 RBC/hpf    WBC, UA 0-5 0 - 5 WBC/hpf    Bacteria, UA RARE (A) NONE SEEN    Squamous Epithelial / LPF 0-5 0 - 5    Mucus PRESENT    Respiratory Panel by RT PCR (Flu A&B, Covid) - Nasopharyngeal Swab     Status: None    Collection Time: 02/28/20  1:21 PM    Specimen: Nasopharyngeal Swab  Result Value Ref Range    SARS Coronavirus 2 by RT PCR NEGATIVE NEGATIVE    Influenza A by PCR NEGATIVE NEGATIVE    Influenza B by PCR NEGATIVE NEGATIVE  I-stat chem 8, ED     Status: Abnormal    Collection Time: 02/28/20  2:09 PM  Result Value Ref Range     Sodium 147 (H) 135 - 145 mmol/L    Potassium 3.9 3.5 - 5.1 mmol/L    Chloride 116 (H) 98 - 111 mmol/L    BUN 41 (H) 8 - 23 mg/dL    Creatinine, Ser 1.47 (H) 0.61 - 1.24 mg/dL    Glucose, Bld 829 (H) 70 - 99 mg/dL    Calcium, Ion 5.62 1.30 - 1.40 mmol/L    TCO2 25 22 - 32 mmol/L    Hemoglobin 11.6 (L) 13.0 - 17.0 g/dL    HCT 86.5 (L) 39 - 52 %  Digoxin level     Status: Abnormal    Collection Time: 02/28/20  5:04 PM  Result Value Ref Range    Digoxin Level <0.2 (L) 1.0 - 2.0 ng/mL  Hemoglobin A1c     Status: Abnormal    Collection Time: 02/28/20  5:04 PM  Result Value Ref Range    Hgb A1c MFr Bld 6.8 (H) 4.8 - 5.6 %    Mean Plasma Glucose 148.46 mg/dL  Glucose, capillary     Status: Abnormal    Collection Time: 02/28/20  5:25 PM  Result Value Ref Range    Glucose-Capillary 130 (H) 70 - 99 mg/dL  Glucose, capillary     Status: None    Collection Time: 02/28/20  9:18 PM  Result Value Ref Range    Glucose-Capillary 91 70 - 99 mg/dL  Lipid panel     Status: Abnormal    Collection Time: 02/29/20  2:06 AM  Result Value Ref Range    Cholesterol 190 0 - 200 mg/dL    Triglycerides 78 <784 mg/dL    HDL 57 >69 mg/dL    Total CHOL/HDL Ratio 3.3 RATIO    VLDL 16 0 - 40 mg/dL    LDL Cholesterol 629 (H) 0 - 99 mg/dL  Basic metabolic panel     Status: Abnormal    Collection Time: 02/29/20  2:06 AM  Result Value Ref Range    Sodium 149 (H) 135 - 145 mmol/L    Potassium 3.7 3.5 - 5.1 mmol/L    Chloride 113 (H) 98 - 111 mmol/L    CO2 22 22 - 32 mmol/L    Glucose, Bld 89 70 - 99 mg/dL    BUN 32 (H) 8 - 23 mg/dL    Creatinine, Ser 5.28 (H) 0.61 - 1.24 mg/dL    Calcium 9.6 8.9 - 41.3 mg/dL    GFR, Estimated 54 (L) >60 mL/min    Anion gap 14 5 - 15  Heparin level (unfractionated)     Status: Abnormal  Collection Time: 02/29/20  2:06 AM  Result Value Ref Range    Heparin Unfractionated 0.11 (L) 0.30 - 0.70 IU/mL  CBC     Status: Abnormal    Collection Time: 02/29/20  2:06 AM  Result  Value Ref Range    WBC 8.4 4.0 - 10.5 K/uL    RBC 3.80 (L) 4.22 - 5.81 MIL/uL    Hemoglobin 12.3 (L) 13.0 - 17.0 g/dL    HCT 40.9 (L) 39 - 52 %    MCV 98.7 80.0 - 100.0 fL    MCH 32.4 26.0 - 34.0 pg    MCHC 32.8 30.0 - 36.0 g/dL    RDW 81.1 91.4 - 78.2 %    Platelets 204 150 - 400 K/uL    nRBC 0.0 0.0 - 0.2 %       Imaging Results (Last 48 hours)  DG Chest 2 View   Result Date: 02/28/2020 CLINICAL DATA:  CVA EXAM: CHEST - 2 VIEW COMPARISON:  02/05/2020 FINDINGS: Prior median sternotomy. Heart and mediastinal contours are within normal limits. No focal opacities or effusions. No acute bony abnormality. IMPRESSION: No active cardiopulmonary disease. Electronically Signed   By: Charlett Nose M.D.   On: 02/28/2020 19:06    CT Cervical Spine Wo Contrast   Result Date: 02/28/2020 CLINICAL DATA:  Neck trauma, weakness. EXAM: CT CERVICAL SPINE WITHOUT CONTRAST TECHNIQUE: Multidetector CT imaging of the cervical spine was performed without intravenous contrast. Multiplanar CT image reconstructions were also generated. COMPARISON:  Same day head and maxillofacial CTs. FINDINGS: Alignment: Straightening of lordosis with slight reversal. Grade 1 C2-3 and C3-4 anterolisthesis. Minimal grade 1 C5-6 retrolisthesis. Skull base and vertebrae: Vertebral body heights are preserved. Multilevel degenerative changes including endplate sclerosis, osteophytosis and Schmorl's node formation. Fusion of the bilateral C2-3 facet joints. Soft tissues and spinal canal: No prevertebral fluid or swelling. No visible canal hematoma. Disc levels: Patent bony spinal canal. Multilevel mild bony neural foraminal narrowing. Upper chest: Left apical scarring. Other: None. IMPRESSION: No acute fracture or traumatic listhesis. Multilevel spondylosis. Electronically Signed   By: Stana Bunting M.D.   On: 02/28/2020 14:47    MR ANGIO HEAD WO CONTRAST   Result Date: 02/29/2020 CLINICAL DATA:  Follow-up examination for acute  stroke. EXAM: MRI HEAD WITHOUT CONTRAST MRA HEAD WITHOUT CONTRAST MRA NECK WITHOUT CONTRAST TECHNIQUE: Multiplanar, multiecho pulse sequences of the brain and surrounding structures were obtained without intravenous contrast. Angiographic images of the Circle of Willis were obtained using MRA technique without intravenous contrast. Angiographic images of the neck were obtained using MRA technique without intravenous contrast. Carotid stenosis measurements (when applicable) are obtained utilizing NASCET criteria, using the distal internal carotid diameter as the denominator. COMPARISON:  Prior CT from 02/28/2020. FINDINGS: MRI HEAD FINDINGS Brain: Generalized age-related cerebral atrophy. Patchy T2/FLAIR hyperintensity within the periventricular and deep white matter both cerebral hemispheres most consistent with chronic small vessel ischemic disease, mild to moderate in nature. Multiple scattered remote cerebellar infarcts noted, right greater than left. Associated scattered foci of chronic hemosiderin staining. Confluent diffusion abnormality involving the right basal ganglia consistent with an acute to subacute right MCA territory infarct. Associated susceptibility artifact compatible with petechial hemorrhage without frank hemorrhagic transformation. Additional patchy cortical and subcortical infarcts noted within the adjacent right frontal operculum. Findings correspond with abnormality on prior CT. Mild localized swelling at the right basal ganglia with partial effacement of the right lateral ventricle without significant midline shift or regional mass effect. No mass lesion. No hydrocephalus  or extra-axial fluid collection. Pituitary gland and suprasellar region normal. Vascular: Major intracranial vascular flow voids are maintained. Skull and upper cervical spine: Advanced degenerative changes noted about the dens and C1-2 articulation with degenerative thickening at the tectorial membrane. Resultant mild  spinal stenosis at the cervicomedullary junction without frank cord impingement. Bone marrow signal intensity within normal limits. No scalp soft tissue abnormality. Sinuses/Orbits: Globes and orbital soft tissues within normal limits. Paranasal sinuses are largely clear. No mastoid effusion. Inner ear structures grossly normal. Other: None. MRA HEAD FINDINGS ANTERIOR CIRCULATION: Visualized distal cervical segments of the internal carotid arteries are patent with antegrade flow. Petrous, cavernous, and supraclinoid segments patent without stenosis or other abnormality. A1 segments patent bilaterally. Normal anterior communicating artery complex. Anterior cerebral arteries patent to their distal aspects without stenosis. Left M1 widely patent. Normal left MCA bifurcation. Distal left MCA branches well perfused. On the right, there is focal attenuation of the proximal right M1 segment, measuring 5 mm in length (series 1040, image 10). Finding could reflect a severe high-grade stenosis or possibly partially occlusive and/or recanalized thrombus. No definite corresponding FLAIR signal abnormality seen at this level on corresponding brain MRI. Right M1 and distal MCA branches are patent distally but are markedly attenuated. No visible proximal M2 occlusion. POSTERIOR CIRCULATION: Vertebral arteries patent to the vertebrobasilar junction without stenosis. Right PICA patent. Left PICA not definitely seen. Basilar patent to its distal aspect without stenosis. Superior cerebral arteries patent bilaterally. Left PCA primarily supplied via the basilar. Right PCA supplied via a hypoplastic right P1 segment and robust right posterior communicating artery. Right PCA widely patent to its distal aspect. Moderate stenosis involving the mid left P2 segment noted (series 1034, image 16). Left PCA otherwise patent distally. No aneurysm. MRA NECK FINDINGS Examination severely limited as the patient was unable to tolerate the full  length of the exam, and terminated the study early. Partially visualized aortic arch grossly within normal limits for caliber. No obvious stenosis about the origin of the great vessels. Both carotid artery systems appear grossly patent within the neck. Vertebral arteries appear grossly patent as well. Evaluation for possible flow-limiting stenosis or other vascular pathology severely limited. IMPRESSION: MRI HEAD IMPRESSION: 1. 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 Associated petechial hemorrhage at the right basal ganglia without frank hemorrhagic transformation or significant regional mass effect. 2. Underlying age-related cerebral atrophy with chronic small vessel ischemic disease, with multiple scattered remote cerebellar infarcts, right greater than left. MRA HEAD IMPRESSION: 1. Severe high-grade stenosis or possibly partially occlusive and/or recanalized thrombus within the proximal right M1 segment. Patent but attenuated flow distally within the right MCA distribution. Follow-up examination with dedicated CTA could be performed for further evaluation as clinically warranted. 2. Moderate left P2 stenosis. Otherwise wide patency of the major intracranial arterial vasculature. MRA NECK IMPRESSION: 1. Severely limited study due to the patient's inability to tolerate the full length of the exam, which was terminated early. 2. Gross patency of the major arterial vasculature of the neck. Examination is otherwise nondiagnostic in evaluation for stenosis or other potential acute vascular pathology. Electronically Signed   By: Rise Mu M.D.   On: 02/29/2020 02:03    MR ANGIO NECK WO CONTRAST   Result Date: 02/29/2020 CLINICAL DATA:  Follow-up examination for acute stroke. EXAM: MRI HEAD WITHOUT CONTRAST MRA HEAD WITHOUT CONTRAST MRA NECK WITHOUT CONTRAST TECHNIQUE: Multiplanar, multiecho pulse sequences of the brain and  surrounding structures were obtained without intravenous contrast. Angiographic images of the Circle of Willis were obtained using MRA technique without intravenous contrast. Angiographic images of the neck were obtained using MRA technique without intravenous contrast. Carotid stenosis measurements (when applicable) are obtained utilizing NASCET criteria, using the distal internal carotid diameter as the denominator. COMPARISON:  Prior CT from 02/28/2020. FINDINGS: MRI HEAD FINDINGS Brain: Generalized age-related cerebral atrophy. Patchy T2/FLAIR hyperintensity within the periventricular and deep white matter both cerebral hemispheres most consistent with chronic small vessel ischemic disease, mild to moderate in nature. Multiple scattered remote cerebellar infarcts noted, right greater than left. Associated scattered foci of chronic hemosiderin staining. Confluent diffusion abnormality involving the right basal ganglia consistent with an acute to subacute right MCA territory infarct. Associated susceptibility artifact compatible with petechial hemorrhage without frank hemorrhagic transformation. Additional patchy cortical and subcortical infarcts noted within the adjacent right frontal operculum. Findings correspond with abnormality on prior CT. Mild localized swelling at the right basal ganglia with partial effacement of the right lateral ventricle without significant midline shift or regional mass effect. No mass lesion. No hydrocephalus or extra-axial fluid collection. Pituitary gland and suprasellar region normal. Vascular: Major intracranial vascular flow voids are maintained. Skull and upper cervical spine: Advanced degenerative changes noted about the dens and C1-2 articulation with degenerative thickening at the tectorial membrane. Resultant mild spinal stenosis at the cervicomedullary junction without frank cord impingement. Bone marrow signal intensity within normal limits. No scalp soft tissue  abnormality. Sinuses/Orbits: Globes and orbital soft tissues within normal limits. Paranasal sinuses are largely clear. No mastoid effusion. Inner ear structures grossly normal. Other: None. MRA HEAD FINDINGS ANTERIOR CIRCULATION: Visualized distal cervical segments of the internal carotid arteries are patent with antegrade flow. Petrous, cavernous, and supraclinoid segments patent without stenosis or other abnormality. A1 segments patent bilaterally. Normal anterior communicating artery complex. Anterior cerebral arteries patent to their distal aspects without stenosis. Left M1 widely patent. Normal left MCA bifurcation. Distal left MCA branches well perfused. On the right, there is focal attenuation of the proximal right M1 segment, measuring 5 mm in length (series 1040, image 10). Finding could reflect a severe high-grade stenosis or possibly partially occlusive and/or recanalized thrombus. No definite corresponding FLAIR signal abnormality seen at this level on corresponding brain MRI. Right M1 and distal MCA branches are patent distally but are markedly attenuated. No visible proximal M2 occlusion. POSTERIOR CIRCULATION: Vertebral arteries patent to the vertebrobasilar junction without stenosis. Right PICA patent. Left PICA not definitely seen. Basilar patent to its distal aspect without stenosis. Superior cerebral arteries patent bilaterally. Left PCA primarily supplied via the basilar. Right PCA supplied via a hypoplastic right P1 segment and robust right posterior communicating artery. Right PCA widely patent to its distal aspect. Moderate stenosis involving the mid left P2 segment noted (series 1034, image 16). Left PCA otherwise patent distally. No aneurysm. MRA NECK FINDINGS Examination severely limited as the patient was unable to tolerate the full length of the exam, and terminated the study early. Partially visualized aortic arch grossly within normal limits for caliber. No obvious stenosis about the  origin of the great vessels. Both carotid artery systems appear grossly patent within the neck. Vertebral arteries appear grossly patent as well. Evaluation for possible flow-limiting stenosis or other vascular pathology severely limited. IMPRESSION: MRI HEAD IMPRESSION: 1. 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 Associated petechial hemorrhage at the right basal ganglia without frank hemorrhagic transformation  or significant regional mass effect. 2. Underlying age-related cerebral atrophy with chronic small vessel ischemic disease, with multiple scattered remote cerebellar infarcts, right greater than left. MRA HEAD IMPRESSION: 1. Severe high-grade stenosis or possibly partially occlusive and/or recanalized thrombus within the proximal right M1 segment. Patent but attenuated flow distally within the right MCA distribution. Follow-up examination with dedicated CTA could be performed for further evaluation as clinically warranted. 2. Moderate left P2 stenosis. Otherwise wide patency of the major intracranial arterial vasculature. MRA NECK IMPRESSION: 1. Severely limited study due to the patient's inability to tolerate the full length of the exam, which was terminated early. 2. Gross patency of the major arterial vasculature of the neck. Examination is otherwise nondiagnostic in evaluation for stenosis or other potential acute vascular pathology. Electronically Signed   By: Rise Mu M.D.   On: 02/29/2020 02:03    MR BRAIN WO CONTRAST   Result Date: 02/29/2020 CLINICAL DATA:  Follow-up examination for acute stroke. EXAM: MRI HEAD WITHOUT CONTRAST MRA HEAD WITHOUT CONTRAST MRA NECK WITHOUT CONTRAST TECHNIQUE: Multiplanar, multiecho pulse sequences of the brain and surrounding structures were obtained without intravenous contrast. Angiographic images of the Circle of Willis were obtained using MRA technique without  intravenous contrast. Angiographic images of the neck were obtained using MRA technique without intravenous contrast. Carotid stenosis measurements (when applicable) are obtained utilizing NASCET criteria, using the distal internal carotid diameter as the denominator. COMPARISON:  Prior CT from 02/28/2020. FINDINGS: MRI HEAD FINDINGS Brain: Generalized age-related cerebral atrophy. Patchy T2/FLAIR hyperintensity within the periventricular and deep white matter both cerebral hemispheres most consistent with chronic small vessel ischemic disease, mild to moderate in nature. Multiple scattered remote cerebellar infarcts noted, right greater than left. Associated scattered foci of chronic hemosiderin staining. Confluent diffusion abnormality involving the right basal ganglia consistent with an acute to subacute right MCA territory infarct. Associated susceptibility artifact compatible with petechial hemorrhage without frank hemorrhagic transformation. Additional patchy cortical and subcortical infarcts noted within the adjacent right frontal operculum. Findings correspond with abnormality on prior CT. Mild localized swelling at the right basal ganglia with partial effacement of the right lateral ventricle without significant midline shift or regional mass effect. No mass lesion. No hydrocephalus or extra-axial fluid collection. Pituitary gland and suprasellar region normal. Vascular: Major intracranial vascular flow voids are maintained. Skull and upper cervical spine: Advanced degenerative changes noted about the dens and C1-2 articulation with degenerative thickening at the tectorial membrane. Resultant mild spinal stenosis at the cervicomedullary junction without frank cord impingement. Bone marrow signal intensity within normal limits. No scalp soft tissue abnormality. Sinuses/Orbits: Globes and orbital soft tissues within normal limits. Paranasal sinuses are largely clear. No mastoid effusion. Inner ear structures  grossly normal. Other: None. MRA HEAD FINDINGS ANTERIOR CIRCULATION: Visualized distal cervical segments of the internal carotid arteries are patent with antegrade flow. Petrous, cavernous, and supraclinoid segments patent without stenosis or other abnormality. A1 segments patent bilaterally. Normal anterior communicating artery complex. Anterior cerebral arteries patent to their distal aspects without stenosis. Left M1 widely patent. Normal left MCA bifurcation. Distal left MCA branches well perfused. On the right, there is focal attenuation of the proximal right M1 segment, measuring 5 mm in length (series 1040, image 10). Finding could reflect a severe high-grade stenosis or possibly partially occlusive and/or recanalized thrombus. No definite corresponding FLAIR signal abnormality seen at this level on corresponding brain MRI. Right M1 and distal MCA branches are patent distally but are markedly attenuated. No visible proximal M2 occlusion.  POSTERIOR CIRCULATION: Vertebral arteries patent to the vertebrobasilar junction without stenosis. Right PICA patent. Left PICA not definitely seen. Basilar patent to its distal aspect without stenosis. Superior cerebral arteries patent bilaterally. Left PCA primarily supplied via the basilar. Right PCA supplied via a hypoplastic right P1 segment and robust right posterior communicating artery. Right PCA widely patent to its distal aspect. Moderate stenosis involving the mid left P2 segment noted (series 1034, image 16). Left PCA otherwise patent distally. No aneurysm. MRA NECK FINDINGS Examination severely limited as the patient was unable to tolerate the full length of the exam, and terminated the study early. Partially visualized aortic arch grossly within normal limits for caliber. No obvious stenosis about the origin of the great vessels. Both carotid artery systems appear grossly patent within the neck. Vertebral arteries appear grossly patent as well. Evaluation for  possible flow-limiting stenosis or other vascular pathology severely limited. IMPRESSION: MRI HEAD IMPRESSION: 1. 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 Associated petechial hemorrhage at the right basal ganglia without frank hemorrhagic transformation or significant regional mass effect. 2. Underlying age-related cerebral atrophy with chronic small vessel ischemic disease, with multiple scattered remote cerebellar infarcts, right greater than left. MRA HEAD IMPRESSION: 1. Severe high-grade stenosis or possibly partially occlusive and/or recanalized thrombus within the proximal right M1 segment. Patent but attenuated flow distally within the right MCA distribution. Follow-up examination with dedicated CTA could be performed for further evaluation as clinically warranted. 2. Moderate left P2 stenosis. Otherwise wide patency of the major intracranial arterial vasculature. MRA NECK IMPRESSION: 1. Severely limited study due to the patient's inability to tolerate the full length of the exam, which was terminated early. 2. Gross patency of the major arterial vasculature of the neck. Examination is otherwise nondiagnostic in evaluation for stenosis or other potential acute vascular pathology. Electronically Signed   By: Rise Mu M.D.   On: 02/29/2020 02:03    DG Shoulder Left   Result Date: 02/28/2020 CLINICAL DATA:  Fall.  Abrasion to left shoulder. EXAM: LEFT SHOULDER - 2+ VIEW COMPARISON:  None FINDINGS: There is normal bony alignment. No evidence of acute osseous or articular abnormality. Degenerative changes of the acromioclavicular joint. IMPRESSION: No evidence of acute osseous or articular abnormality. Electronically Signed   By: Jackey Loge DO   On: 02/28/2020 13:06    DG Knee Complete 4 Views Left   Result Date: 02/28/2020 CLINICAL DATA:  Fall. EXAM: LEFT KNEE - COMPLETE 4+ VIEW COMPARISON:  No pertinent prior  exams are available for comparison. FINDINGS: There is normal bony alignment. No evidence of acute osseous or articular abnormality. Tricompartmental osteoarthritic changes. Atherosclerotic vascular calcifications. IMPRESSION: No evidence of acute osseous or articular abnormality. Electronically Signed   By: Jackey Loge DO   On: 02/28/2020 13:08    DG Knee Complete 4 Views Right   Result Date: 02/28/2020 CLINICAL DATA:  Fall EXAM: RIGHT KNEE - COMPLETE 4+ VIEW COMPARISON:  None. FINDINGS: Alignment is anatomic. There is no acute fracture. Small joint effusion. Tricompartmental changes of osteoarthritis. Vascular calcifications. IMPRESSION: No acute fracture. Small joint effusion. Tricompartmental osteoarthritis. Electronically Signed   By: Guadlupe Spanish M.D.   On: 02/28/2020 13:08    DG Abd 2 Views   Result Date: 02/28/2020 CLINICAL DATA:  Recent fall with abdominal pain, initial encounter EXAM: ABDOMEN - 2 VIEW COMPARISON:  None. FINDINGS: Scattered large and small bowel gas is noted. No acute bony abnormality  is seen. No free air is noted. No mass lesion is noted. IMPRESSION: No acute abnormality noted. Electronically Signed   By: Alcide Clever M.D.   On: 02/28/2020 21:12    CT HEAD CODE STROKE WO CONTRAST   Result Date: 02/28/2020 CLINICAL DATA:  79 year old male found down, last seen well 02/25/2020. left face abrasions. Code stroke activated initially, but now canceled. EXAM: CT HEAD WITHOUT CONTRAST TECHNIQUE: Contiguous axial images were obtained from the base of the skull through the vertex without intravenous contrast. COMPARISON:  [facility] hospital brain MRI 06/08/2019, head CT 06/08/2019. FINDINGS: Brain: Confluent new hypodensity throughout the right basal ganglia which are swollen with mass effect on the left lateral ventricle. No midline shift. Nearby patchy opacity compatible with cytotoxic edema involving the superior right operculum (series 3, image 21). Underlying bilateral basal  ganglia dystrophic calcifications. No acute intracranial hemorrhage identified. No ventriculomegaly. Basilar cisterns remain patent. Stable chronic cerebellar infarcts. No other acute cortically based infarct identified. Vascular: Calcified atherosclerosis at the skull base. No right side hyperdense vessel identified. However there is conspicuous hyperdensity of a left MCA branch in the sylvian fissure on coronal image 32, however, this is probably stable since last month (series 8, image 47 of the prior CT). Skull: No skull fracture or No acute osseous abnormality identified. Sinuses/Orbits: Visualized paranasal sinuses and mastoids are stable and well pneumatized. Other: Broad-based left superior convexity scalp hematoma. There is left forehead and periorbital soft tissue hematoma also. No soft tissue gas. Leftward gaze, otherwise negative orbits. ASPECTS [facility] Stroke Program Early CT Score) Total score (0-10 with 10 being normal): 7 (right side caudate, lentiform, M5 abnormal). IMPRESSION: 1. 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 Mild regional mass effect. No hemorrhagic transformation or midline shift. 2. Otherwise stable noncontrast CT appearance of the brain from last month. 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 3. Left face and scalp soft tissue hematoma. No underlying fracture identified. Electronically Signed   By: Odessa Fleming M.D.   On: 02/28/2020 11:46    CT Maxillofacial Wo Contrast   Result Date: 02/28/2020 CLINICAL DATA:  Facial trauma, weakness. EXAM: CT MAXILLOFACIAL WITHOUT CONTRAST TECHNIQUE: Multidetector CT imaging of the maxillofacial structures was performed. Multiplanar CT image reconstructions were also generated. COMPARISON:  02/28/2020 CT head. FINDINGS: Osseous: No fracture or mandibular dislocation. Left TMJ osteoarthrosis. Orbits: Globes are intact. Normal appearance of the extraocular muscles  and optic nerve. No significant intraorbital fat stranding. Prominence of the left periorbital soft tissues. Sinuses: Clear paranasal sinuses.  No mastoid effusion. Soft tissues: Mild left scalp and facial soft tissue swelling. No walled-off fluid collection. Irregularity of the left perimandibular soft tissues may reflect overlying laceration. Limited intracranial: Please see prior same day head CT. IMPRESSION: No acute osseous abnormality.  Left TMJ osteoarthrosis. Mild left scalp and face soft tissue swelling. No walled-off fluid collection. Left perimandibular soft tissue laceration. Electronically Signed   By: Stana Bunting M.D.   On: 02/28/2020 14:39       Assessment/Plan: Diagnosis: R MCA infarct 1. Does the need for close, 24 hr/day medical supervision in concert with the patient's rehab needs make it unreasonable for this patient to be served in a less intensive setting? Yes 2. Co-Morbidities requiring supervision/potential complications: malnourishment (BMI 18.13), MRSA, tobacco abuse, essential HTN, coronary atherosclerosis, type 2 DM, NSTEMI, CAD 3. Due to bladder management, bowel management, safety, skin/wound care, disease management, medication administration, pain management and patient education,  does the patient require 24 hr/day rehab nursing? Yes 4. Does the patient require coordinated care of a physician, rehab nurse, therapy disciplines of PT, OT to address physical and functional deficits in the context of the above medical diagnosis(es)? Yes Addressing deficits in the following areas: balance, endurance, locomotion, strength, transferring, bowel/bladder control, bathing, dressing, feeding, grooming, toileting, cognition and psychosocial support, swallow 5. Can the patient actively participate in an intensive therapy program of at least 3 hrs of therapy per day at least 5 days per week? Yes 6. The potential for patient to make measurable gains while on inpatient rehab is  excellent 7. Anticipated functional outcomes upon discharge from inpatient rehab are modified independent  with PT, modified independent with OT, modified independent with SLP. 8. Estimated rehab length of stay to reach the above functional goals is: 12-16 days 9. Anticipated discharge destination: Home 10. Overall Rehab/Functional Prognosis: excellent   RECOMMENDATIONS: This patient's condition is appropriate for continued rehabilitative care in the following setting: CIR Patient has agreed to participate in recommended program. Yes Note that insurance prior authorization may be required for reimbursement for recommended care.   Comment: Thank you for this consult. Admission coordinator to follow.    I have personally performed a face to face diagnostic evaluation, including, but not limited to relevant history and physical exam findings, of this patient and developed relevant assessment and plan.  Additionally, I have reviewed and concur with the physician assistant's documentation above.   Sula Soda, MD    Charlton Amor, PA-C 02/29/2020        Revision History                     Routing History                Note Details  Author Carlis Abbott, Drema Pry, MD File Time 02/29/2020 12:03 PM  Author Type Physician Status Signed  Last Editor [name], MD Service Physical Medicine and Rehabilitation

## 2020-03-05 NOTE — Plan of Care (Signed)

## 2020-03-05 NOTE — Progress Notes (Signed)
Pt arrived on unit to 4W08 per bed. Reviewed plan of care, medications, and safety. Pt verbalizes agreement.  Mylo Red, LPN

## 2020-03-05 NOTE — Progress Notes (Signed)
Inpatient Rehabilitation Medication Review by a Pharmacist  A complete drug regimen review was completed for this patient to identify any potential clinically significant medication issues.    Type of Medication Issue Identified Description of Issue Urgent (address now) Non-Urgent (address on AM team rounds) Plan Plan Accepted by Provider? (Yes / No / Pending AM Rounds)  Drug Interaction(s) (clinically significant)       Duplicate Therapy       Allergy       No Medication Administration End Date       Incorrect Dose       Additional Drug Therapy Needed       Other   H/o HTN, noncompliant with hypertensive medication PTA.  On [facility] admission 11/4-11/10/21, on Hydralazine q6hr PRN for SBP >150 or DBP>100.  Not resumed on CIR admission.  Dr. Pola Corn noted, Long-term SBP goal on 130-150  Non-urgent  Monitor blood pressure.  Consider resuming PRN hydralazine.       For non-urgent medication issues to be resolved on team rounds tomorrow morning a [facility] Secure Chat Handoff was sent to:  Dr. Claudette Laws and Delle Reining, PA-C   History of HTN , noncompliant with blood pressure (?metoprolol succinate) medications noted prior to admission.   At [facility] inpatient admission he had orders for Hydralazine 5 mg IV q6 hours PRN SBP >150 or DBP >100.  Dr. Pola Corn noted, Long-term SBP goal on 130-150 given right MCA high-grade stenosis versus occlusion.  Time spent performing this drug regimen review (minutes):  15   Noah Delaine, [place] Clinical Pharmacist Please check AMION for all [facility] phone numbers After 10:00 PM, call Main Pharmacy [phone] 03/05/2020 5:30 PM

## 2020-03-05 NOTE — Progress Notes (Signed)
Physical Therapy Treatment Patient Details Name: [name] MRN: [id number] DOB: [birth year] Today's Date: 03/05/2020    History of Present Illness Pt is a 79 year old male who presented with L-sided weakness, L facial droop, and confusion after being on the ground at home. He did not receive tPA and NIH 14-16. 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 It also showed "Associated petechial hemorrhage at the right basal ganglia without frank hemorrhagic transformation or significant regional mass effect", per MRI report. Medical hx of: CAD, DM, and ischemic cardiomyopathy.    PT Comments    Pt is displaying improvement in functional status since yesterday's session as he is feeling better. He was able to transition supine > sit EOB with extra time and cues to sequence his movement with minA. In addition, he was able to come to stand with minA. Focused session on gait training by taking several steps anteriorly and then posteriorly with modA. However, pt displays possible pushing to his L as he resists shifting his weight to his R in standing despite max cues and education on pushing. Will continue to follow acutely and recommend CIR upon d/c to address his deficits in mobility, LE strength, coordination, and balance. Recommending CIR secondary to his improved functional status and tolerance to participate and drastic change in functional status compared to PLOF.    Follow Up Recommendations  CIR;Supervision/Assistance - 24 hour     Equipment Recommendations  Rolling walker with 5" wheels;3in1 (PT);Wheelchair (measurements PT);Wheelchair cushion (measurements PT) (TBD as pt progresses)    Recommendations for Other Services Rehab consult     Precautions / Restrictions Precautions Precautions: Fall Restrictions Weight Bearing Restrictions: No    Mobility  Bed Mobility Overal bed mobility: Needs Assistance Bed Mobility:  Supine to Sit;Sit to Supine     Supine to sit: HOB elevated;Min assist Sit to supine: HOB elevated;Mod assist   General bed mobility comments: MinA to transition supine > sit R EOB with cues for LE management off EOB and to place R hand on R bed rail to ascend trunk. Sit > supine with modA at LEs and trunk.  Transfers Overall transfer level: Needs assistance Equipment used: 1 person hand held assist Transfers: Sit to/from Stand Sit to Stand: Min assist         General transfer comment: STS 2x from EOB, cuing pt for B hand placement and providing L knee block. Counting down from 2 with pt initiating transfer and requiring extra time to power up to stand.  Ambulation/Gait Ambulation/Gait assistance: Mod assist Gait Distance (Feet): 2 Feet (x2 bouts, anterior and posterior steps with B LEs 1-2x each) Assistive device: 1 person hand held assist Gait Pattern/deviations: Step-to pattern;Decreased stride length;Decreased weight shift to right;Narrow base of support Gait velocity: decreased Gait velocity interpretation: <1.31 ft/sec, indicative of household ambulator General Gait Details: Pt resisting weight shift to R LE despite max cues and pt education on pushing tendency. L knee block provided and step-by-step VCs provided to seuqence weight shifts and LE advancement with 1-2 steps anterior and posterior each LE x2 bouts.   Stairs             Wheelchair Mobility    Modified Rankin (Stroke Patients Only) Modified Rankin (Stroke Patients Only) Pre-Morbid Rankin Score: No symptoms Modified Rankin: Moderately severe disability     Balance Overall balance assessment: Needs assistance Sitting-balance support: Bilateral upper extremity supported;Feet supported Sitting balance-Leahy  Scale: Poor Sitting balance - Comments: Min guard assist and B UE support for static sitting EOB.   Standing balance support: Single extremity supported Standing balance-Leahy Scale:  Poor Standing balance comment: Requires R UE support and resists R weight shift with pushing noted towards L.                             Cognition Arousal/Alertness: Awake/alert Behavior During Therapy: Flat affect Overall Cognitive Status: Impaired/Different from baseline Area of Impairment: Attention;Following commands;Safety/judgement;Awareness;Problem solving                   Current Attention Level: Sustained   Following Commands: Follows one step commands consistently;Follows one step commands with increased time Safety/Judgement: Decreased awareness of safety;Decreased awareness of deficits Awareness: Emergent Problem Solving: Slow processing;Decreased initiation;Difficulty sequencing;Requires verbal cues;Requires tactile cues General Comments: Pt slow and requiring step-by-step cues to sequence and perform all tasks safely. Pt unaware of deficits as he tends to push towards his L in standing and reports feeling like he is falling to his R.      Exercises      General Comments        Pertinent Vitals/Pain Pain Assessment: 0-10 Pain Score: 5  Pain Location: general Pain Descriptors / Indicators: Discomfort;Grimacing Pain Intervention(s): Limited activity within patient's tolerance;Monitored during session;Repositioned;Patient requesting pain meds-RN notified    Home Living                      Prior Function            PT Goals (current goals can now be found in the care plan section) Acute Rehab PT Goals Patient Stated Goal: not explicitly stated this date PT Goal Formulation: With patient Time For Goal Achievement: 03/14/20 Potential to Achieve Goals: Good Progress towards PT goals: Progressing toward goals    Frequency    Min 4X/week      PT Plan Current plan remains appropriate    Co-evaluation              AM-PAC PT "6 Clicks" Mobility   Outcome Measure  Help needed turning from your back to your side while  in a flat bed without using bedrails?: A Little Help needed moving from lying on your back to sitting on the side of a flat bed without using bedrails?: A Little Help needed moving to and from a bed to a chair (including a wheelchair)?: A Lot Help needed standing up from a chair using your arms (e.g., wheelchair or bedside chair)?: A Little Help needed to walk in hospital room?: A Lot Help needed climbing 3-5 steps with a railing? : A Lot 6 Click Score: 15    End of Session Equipment Utilized During Treatment: Gait belt Activity Tolerance: Patient tolerated treatment well;Patient limited by fatigue Patient left: in bed;with call bell/phone within reach;with bed alarm set Nurse Communication: Patient requests pain meds PT Visit Diagnosis: Unsteadiness on feet (R26.81);Other abnormalities of gait and mobility (R26.89);Muscle weakness (generalized) (M62.81);Difficulty in walking, not elsewhere classified (R26.2);Other symptoms and signs involving the nervous system (R29.898);Hemiplegia and hemiparesis Hemiplegia - Right/Left: Left Hemiplegia - dominant/non-dominant: Non-dominant Hemiplegia - caused by: Cerebral infarction     Time: 1050-1103 PT Time Calculation (min) (ACUTE ONLY): 13 min  Charges:  $Gait Training: 8-22 mins                     Anessa Pettis,  PT, DPT Acute Rehabilitation Services  Pager: [phone] Office: [phone]    Jewel Baize 03/05/2020, 11:19 AM

## 2020-03-05 NOTE — Progress Notes (Signed)
Jamse Arn, MD  Physician  Physical Medicine and Rehabilitation  PMR Pre-admission      Addendum  Date of Service:  03/01/2020 11:42 AM      Related encounter: ED to Hosp-Admission (Current) from 02/28/2020 in [facility]       PMR Admission Coordinator Pre-Admission Assessment   Patient: [name] is an 79 y.o., male MRN: [id number] DOB: 1940/07/01 Height: '5\' 11"'  (180.3 cm) Weight: 59 kg                                                                                                                                                  Insurance Information HMO:     PPO: yes     PCP:      IPA:      80/20:      OTHER:  PRIMARY: [employer]      Policy#: [id number]      Subscriber: patient CM Name: [name]      Phone#: [phone] [id number]     Fax#: [phone] Pre-Cert#: [id number]      Employer:  Josem Kaufmann provided by [name] for admit to CIR. Updates due 11/15 to Lestine Box (p): [phone] [id number] (f): [phone] Benefits:  Phone #: n/a-[email or web address] at [email or web address]     Name:  Eff. Date: 04/27/2019-04/25/2020     Deduct: does not have for in-network providers      Out of Pocket Max: $4,000 ($275 met)      Life Max: NA  CIR: $160/day co-pay for days 1-10, $0/day co-pay for days 11-90      SNF: 100% coverage; limited by medical necessity Outpatient: NA     Co-Pay: $20/visit; visits limited by medical necessity Home Health: 100% coverage; limited by medical necessity      Co-Pay:  DME: 80% coverage     Co-Pay: 20% Providers: in-network  SECONDARY:       Policy#:       Phone#:    Development worker, community:       Phone#:    The Therapist, art Information Summary" for patients in Inpatient Rehabilitation Facilities with attached "Privacy Act [place] Records" was provided and verbally reviewed with: Family   Emergency Contact Information Contact Information       Name Relation Home Work Mobile    [name],[name] Relative [phone]        Ridley, Schewe  Daughter [phone]   [phone]         Current Medical History  Patient Admitting Diagnosis: R MCA infarct   History of Present Illness: Pt is a 79 y.o.right handed male with 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  Per chart review patient lives alone.  Two-level home  bed and bath main level 2 steps to entry.  Independent with a straight point cane.  Presented 02/28/2020 with left-sided weakness.  Cranial CT/MRI scan showed subacute appearing right MCA infarction with confluent involvement of the basal ganglia.  Mild regional mass-effect.  No hemorrhage.  MRA of head and neck showed severe high-grade stenosis or possibly partially occlusive and/or recanalized thrombus within the proximal right M1 segment.  Patient did not receive TPA.  Admission chemistries alcohol negative, sodium 149, glucose 175, BUN 39, creatinine 1.55, CK 745, urine drug screen negative, hemoglobin A1c 6.8.  Echocardiogram pending.   Therapy evaluations completed with recommendations of physical medicine rehab consult. On 03/04/20, pt developed low grade fever with jump in WBC form 4.5 to 13.1. DC held and workup started. Per acute attending, antibiotics to be started. Pt is to admit to CIR on 03/05/20.    Complete NIHSS TOTAL: 15 Glasgow Coma Scale Score: 14   Past Medical History      Past Medical History:  Diagnosis Date  . Coronary artery disease      a. 2007 dLAD 100->med rx;  b. 2011 PCI: 80 LAD (Promus 2.75x18); c. 03/2015 MV: predom fixed inf/antapical defefcts w/ ischemia, depressed EF;  c. 04/2015 NSTEMI/PCI: LM nl,LAD 40p ISR, 100d (2.5x20 Promus DES), LCX 21m(3.0x20 Promus DES), RCA 30p, 477m70d.   . Diabetes mellitus without complication (HCMathis   . Dyslipidemia    . Hx MRSA infection    . Ischemic cardiomyopathy       a. 04/2015 Echo: EF 25-30%, apical AK, Gr 2 DD, triv AI, mild MR, mildly dil LA, PASP 3551m.  . Tobacco abuse        Family History  family history is not on file.   Prior Rehab/Hospitalizations:  Has the patient had prior rehab or hospitalizations prior to admission? No   Has the patient had major surgery during 100 days prior to admission? No   Current Medications    Current Facility-Administered Medications:  .  acetaminophen (TYLENOL) tablet 650 mg, 650 mg, Oral, Q4H PRN **OR** acetaminophen (TYLENOL) 160 MG/5ML solution 650 mg, 650 mg, Per Tube, Q4H PRN **OR** acetaminophen (TYLENOL) suppository 650 mg, 650 mg, Rectal, Q4H PRN, ZhaWynetta Fines MD .  apixaban (ELIQUIS) tablet 5 mg, 5 mg, Oral, BID, Dahal, Binaya, MD .  atorvastatin (LIPITOR) tablet 80 mg, 80 mg, Oral, q1800, ZhaWynetta Fines MD, 80 mg at 02/29/20 2106 .  hydrALAZINE (APRESOLINE) injection 5 mg, 5 mg, Intravenous, Q6H PRN, ZhaWynetta Fines MD, 5 mg at 02/29/20 0105 .  insulin aspart (novoLOG) injection 0-9 Units, 0-9 Units, Subcutaneous, TID WC, ZhaLequita HaltD, 2 Units at 03/01/20 [id number] LORazepam (ATIVAN) injection 0.5 mg, 0.5 mg, Intravenous, Once, ZhaWynetta Fines MD .  mupirocin cream (BACTROBAN) 2 %, , Topical, BID, DahTerrilee CroakD, Given at 03/01/20 1013   Patients Current Diet:  Diet Order                  DIET - DYS 1 Room service appropriate? Yes; Fluid consistency: Nectar Thick  Diet effective now                         Precautions / Restrictions Precautions Precautions: Fall Precaution Comments: SLP diet motivations that pt is unaware of and perseverates on water Restrictions Weight Bearing Restrictions: No    Has the patient had 2 or more falls or a fall with injury  in the past year?Yes   Prior Activity Level Community (5-7x/wk): drives; pt stated gets out of house everyday   Prior Functional Level Prior Function Level of Independence: Independent with assistive  device(s) Comments: Pt drives.   Self Care: Did the patient need help bathing, dressing, using the toilet or eating?  Independent   Indoor Mobility: Did the patient need assistance with walking from room to room (with or without device)? Independent   Stairs: Did the patient need assistance with internal or external stairs (with or without device)? Independent   Functional Cognition: Did the patient need help planning regular tasks such as shopping or remembering to take medications? Unknown   Home Assistive Devices / Equipment Home Equipment: Grab bars - toilet, Cane - single point, Wheelchair - manual   Prior Device Use: Indicate devices/aids used by the patient prior to current illness, exacerbation or injury?  cane   Current Functional Level Cognition   Overall Cognitive Status: Impaired/Different from baseline Current Attention Level: Sustained Orientation Level: Oriented to person, Disoriented to situation, Disoriented to time, Disoriented to place Following Commands: Follows one step commands inconsistently, Follows one step commands with increased time Safety/Judgement: Decreased awareness of safety, Decreased awareness of deficits General Comments: pt fixated on wanting water and even when staff are acquiring information in chart and speaking aloud about looking at [facility] slp pt talks over therapist i want water. pt provided spoon of water with aspirations and demanding more water to "wash it down" pt lacks any awareness to aspiration. pt taking the telemetry box and slamming it again the bed rail in response to yelling i need water. Pt provided an entire cup of apple juice nectar thick via spoon and continues to perseverate on liquids without awareness to just finsihing the drink. pt is able to states name and location as [facility]    Extremity Assessment (includes Sensation/Coordination)   Upper Extremity Assessment: LUE deficits/detail LUE Deficits /  Details: activation noted in shoulder- pt decreased shoulder flexion to 40 degrees d= LUE Coordination: decreased fine motor, decreased gross motor  Lower Extremity Assessment: Defer to PT evaluation LLE Deficits / Details: Decreased L hip flexion and knee extension strength compared to R, with MMT of 3+ with hip flexion and 4 with knee extension compared to 5 on R LLE Sensation: WNL, decreased light touch (no sensation on B dorsal big toes with light touch) LLE Coordination: WNL     ADLs   Overall ADL's : Needs assistance/impaired Eating/Feeding: Maximal assistance Eating/Feeding Details (indicate cue type and reason): unsafe due to impulsive Grooming: Wash/dry face, Minimal assistance, Sitting Upper Body Bathing: Moderate assistance Lower Body Bathing: Maximal assistance Upper Body Dressing : Moderate assistance Lower Body Dressing: Maximal assistance Toilet Transfer: Moderate assistance General ADL Comments: pt sit <>Stand and side steps due to agitation this session.      Mobility   Overal bed mobility: Needs Assistance Bed Mobility: Supine to Sit, Sit to Supine Supine to sit: Min assist Sit to supine: Mod assist General bed mobility comments: exiting R side of the bed and then returning. pt attempting x3 exit by himself with alarm sounding with OT standing outside the room. pt rattlingrail and demanding water again     Transfers   Overall transfer level: Needs assistance Equipment used: 1 person hand held assist Transfers: Sit to/from Stand Sit to Stand: Mod assist General transfer comment: support on L side and OT holding telemtry box     Ambulation / Gait /  Stairs / Emergency planning/management officer   Ambulation/Gait Ambulation/Gait assistance: Mod assist Gait Distance (Feet): 5 Feet Assistive device: 1 person hand held assist Gait Pattern/deviations: Step-to pattern, Decreased step length - left, Decreased stance time - left, Decreased stance time - right, Decreased stride length,  Shuffle, Narrow base of support General Gait Details: Requires TCs and VCs to correct decreased L stance time and L step length as he tends to shuffle his B feet, esp demonstrating difficulty with advancing L LE. R UE support on therapist, modA to maintain balance. Gait velocity: decreased Gait velocity interpretation: <1.31 ft/sec, indicative of household ambulator     Posture / Balance Dynamic Sitting Balance Sitting balance - Comments: Pt able to maintain static sitting balance with min guard, but unable to lift L LE without posteriorlean/LOB. Balance Overall balance assessment: Needs assistance Sitting-balance support: Bilateral upper extremity supported, Feet supported Sitting balance-Leahy Scale: Poor Sitting balance - Comments: Pt able to maintain static sitting balance with min guard, but unable to lift L LE without posteriorlean/LOB. Standing balance support: Single extremity supported, During functional activity Standing balance-Leahy Scale: Poor Standing balance comment: Trunk sway and unsteadiness noted in standing with R HHA.     Special needs/care consideration Skin Wound: face; Abrasion: face, knee, shoulder, sacrum, penis, Diabetic management novoLOG: 0-9 units 3x daily with meals and Designated visitor Daveon Arpino, daughter        Previous Home Environment (from acute therapy documentation) Living Arrangements: Alone Available Help at Discharge: Family (daughter lives in [place]) Type of Home: House Home Layout: Two level, Able to live on main level with bedroom/bathroom Alternate Level Stairs-Rails: Right, Left, Can reach both Alternate Level Stairs-Number of Steps: a flight of stairs Home Access: Stairs to enter Entrance Stairs-Rails: None Entrance Stairs-Number of Steps: 2 Bathroom Shower/Tub: Multimedia programmer: Standard Bathroom Accessibility: Yes How Accessible: Accessible via walker   Discharge Living Setting Plans for Discharge Living Setting: House  (daughter's house in [place]) where she can provide 24/7 A.  Type of Home at Discharge: House Discharge Home Layout: Two level, 1/2 bath on main level, Other (Comment) (bedroom downstairs) Alternate Level Stairs-Rails: Left Alternate Level Stairs-Number of Steps: 10 Discharge Home Access: Level entry Discharge Bathroom Shower/Tub: Walk-in shower Discharge Bathroom Toilet: Standard Discharge Bathroom Accessibility: Yes How Accessible: Accessible via walker Does the patient have any problems obtaining your medications?: No   Social/Family/Support Systems Anticipated Caregiver: Kaeo Jacome, daughter Anticipated Caregiver's Contact Information: [phone] Ability/Limitations of Caregiver: works from home Caregiver Availability: 24/7 Discharge Plan Discussed with Primary Caregiver: Yes, with daughter Is Caregiver In Agreement with Plan?: Yes Does Caregiver/Family have Issues with Lodging/Transportation while Pt is in Rehab?: No     Goals Patient/Family Goal for Rehab: PT/OT/ST: Min A Expected length of stay: 16-19 days Pt/Family Agrees to Admission and willing to participate: Yes Program Orientation Provided & Reviewed with Pt/Caregiver Including Roles  & Responsibilities: Yes     Decrease burden of Care through IP rehab admission: NA     Possible need for SNF placement upon discharge:NA     Patient Condition: This patient's medical and functional status has changed since the consult dated: 02/29/20 in which the Rehabilitation Physician determined and documented that the patient's condition is appropriate for intensive rehabilitative care in an inpatient rehabilitation facility. See "History of Present Illness" (above) for medical update. Functional changes are: pt has been assessed by OT with Mod A to Total A needs. Patient's medical and functional status update has been discussed with the Rehabilitation  physician and patient remains appropriate for inpatient rehabilitation. Will admit to  inpatient rehab today, 03/04/20.   Preadmission Screen Completed By:  Bethel Born, CCC-SLP, 03/01/2020 11:42 AM with day of admit update completed by Raechel Ache, OTR/L on 03/04/20 at 10:55AM ______________________________________________________________________   Discussed status with Dr. Posey Pronto on 03/05/20 at 9:53AM and received approval for admission today.   Admission Coordinator:  Bethel Born, with day of admit updates completed by Raechel Ache time 9:53AM/Date11/10/21         Revision History                                              Note Details  Author Posey Pronto, Domenick Bookbinder, MD File Time 03/05/2020 10:03 AM  Author Type Physician Status Addendum  Last Editor Jamse Arn, MD Service Physical Medicine and Rehabilitation

## 2020-03-05 NOTE — H&P (Signed)
Physical Medicine and Rehabilitation Admission H&P    Chief Complaint  Patient presents with  . 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 History taken from chart review due to cognition. He was found to have elevated BP with rhabdomyolysis and hypernatremia. 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  MRA brain revealed severe partially occlusive and/or recanalized thrombus in right M1 segment, moderate left P2 stenosis and MRA neck limited due to inability to tolerate exam. Echocardiogram with ejection fraction of 50% with elongated echodensity inferior apex consistent with mural thrombus with hypokinesis.   Per records has history of noncompliance and follows up with physicians at [facility]. He was started on IV heparin and Dr. Roda Shutters felt that stroke cardioembolic due to noncompliance with Eliquis.  Long term BP goal 130-150 give high grade R-MCA stenosis v/s occlusion. Agitation and confusion improving with use of prn Haldol.  Electrolyte abnormalities resolving with hydration but he developed GI distress with lethargy and decline overall mobility on 03/04/2020 due to low-grade fever, multiple episodes of N/V as well as multiple stools. He was found to have leukocytosis with WBC 13.1 with elevated procalcitonin levels.  He has had reports of SOB as well as coughing with meals and Augmentin added today due to concerns of aspiration pneumonitis. Hospital course  further complicated by post stroke dysphagia, started on dysphagia 1 nectar thick diet. Therapy has been ongoing and CIR was recommended due to functional decline.   Please see preadmission assessment from earlier today as well.  Review of Systems  Unable to perform ROS: Mental acuity    Past Medical History:  Diagnosis Date  . Coronary artery disease    a. 2007 dLAD 100->med rx;  b. 2011 PCI: 80 LAD (Promus 2.75x18); c. 03/2015 MV: predom fixed inf/antapical defefcts w/ ischemia, depressed EF;  c. 04/2015 NSTEMI/PCI: LM nl,LAD 40p ISR, 100d (2.5x20 Promus DES), LCX 71m (3.0x20 Promus DES), RCA 30p, 49m, 70d.   . Diabetes mellitus without complication (HCC)   . Dyslipidemia   . Hx MRSA infection   . Ischemic cardiomyopathy    a. 04/2015 Echo: EF 25-30%, apical AK, Gr 2 DD, triv AI, mild MR, mildly dil LA, PASP .  . Tobacco abuse     Past Surgical History:  Procedure Laterality Date  . CARDIAC CATHETERIZATION N/A 05/16/2015   Procedure: Left Heart Cath and Coronary Angiography;  Surgeon: Tonny Bollman, MD;  Location: [facility] INVASIVE CV LAB;  Service: Cardiovascular;  Laterality: N/A;  . CARDIAC CATHETERIZATION N/A 05/16/2015   Procedure: Coronary Stent Intervention;  Surgeon: Tonny Bollman, MD;  Location: [facility] INVASIVE CV LAB;  Service: Cardiovascular;  Laterality: N/A;  . TOTAL SHOULDER REPLACEMENT     Right    Family History: Unable to elicit due to somnolence.     Social History:  Divorced. 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 Daughter in [place]. He reports that he has quit smoking. He has never used smokeless tobacco. He reports that he does not drink alcohol and does not  use drugs.    Allergies: No Known Allergies    Medications Prior to Admission  Medication Sig Dispense Refill  . aspirin 81 MG tablet Take 81 mg by mouth daily.     Marland Kitchen atorvastatin (LIPITOR) 80 MG tablet Take 1 tablet (80 mg total) by mouth daily at 6 PM. PLEASE CONTACT OFFICE FOR ADDITIONAL REFILLS FINAL WARNING 30  tablet 0  . cyclobenzaprine (FLEXERIL) 5 MG tablet Take 5 mg by mouth at bedtime as needed for muscle spasms.     . digoxin (LANOXIN) 0.125 MG tablet Take 0.125 mg by mouth daily.     Marland Kitchen ELIQUIS 5 MG TABS tablet Take 5 mg by mouth 2 (two) times daily.    . isosorbide mononitrate (IMDUR) 30 MG 24 hr tablet Take 30 mg by mouth daily.    . metFORMIN (GLUCOPHAGE) 500 MG tablet Take 500 mg by mouth 2 (two) times daily.    . nitroGLYCERIN (NITROSTAT) 0.4 MG SL tablet PLACE 1 TABLET UNDER THE TONGUE AS DIRECTED. (Patient taking differently: Place under the tongue every 5 (five) minutes as needed for chest pain (max 5 doses). ) 75 tablet 0  . BRILINTA 90 MG TABS tablet TAKE 1 TABLET (90 MG TOTAL) BY MOUTH 2 (TWO) TIMES DAILY. (Patient not taking: Reported on 02/28/2020) 60 tablet 0  . BRILINTA 90 MG TABS tablet TAKE 1 TABLET (90 MG TOTAL) BY MOUTH 2 (TWO) TIMES DAILY. **NEEDS OV** (Patient not taking: Reported on 02/28/2020) 60 tablet 0  . metoprolol succinate (TOPROL XL) 50 MG 24 hr tablet Take 1 tablet (50 mg total) by mouth daily. Take with or immediately following a meal. (Patient not taking: Reported on 02/28/2020) 30 tablet 11  . ramipril (ALTACE) 10 MG capsule Take 1 capsule (10 mg total) by mouth daily. (Patient not taking: Reported on 02/28/2020) 30 capsule 11    Drug Regimen Review  Drug regimen was reviewed and remains appropriate with no significant issues identified  Home: Home Living Family/patient expects to be discharged to:: Private residence Living Arrangements: Alone Available Help at Discharge: Family (daughter lives in [place]) Type of Home: House Home Access: Stairs to enter Secretary/administrator of Steps: 2 Entrance Stairs-Rails: None Home Layout: Two level, Able to live on main level with bedroom/bathroom Alternate Level Stairs-Number of Steps: a flight of stairs Alternate Level Stairs-Rails: Right, Left, Can reach both Bathroom Shower/Tub: Health visitor:  Standard Bathroom Accessibility: Yes Home Equipment: Grab bars - toilet, Cane - single point, Wheelchair - manual   Functional History: Prior Function Level of Independence: Independent with assistive device(s) Comments: Pt drives.  Functional Status:  Mobility: Bed Mobility Overal bed mobility: Needs Assistance Bed Mobility: Supine to Sit, Sit to Supine Supine to sit: HOB elevated, Min assist Sit to supine: HOB elevated, Mod assist General bed mobility comments: MinA to transition supine > sit R EOB with cues for LE management off EOB and to place R hand on R bed rail to ascend trunk. Sit > supine with modA at LEs and trunk. Transfers Overall transfer level: Needs assistance Equipment used: 1 person hand held assist Transfers: Sit to/from Stand Sit to Stand: Min assist Stand pivot transfers: Mod assist General transfer comment: STS 2x from EOB, cuing pt for B hand placement and providing L knee block. Counting down from 2 with pt initiating transfer and requiring extra time to power up to stand. Ambulation/Gait Ambulation/Gait assistance: Mod assist Gait Distance (Feet): 2 Feet (x2 bouts, anterior and posterior steps with B  LEs 1-2x each) Assistive device: 1 person hand held assist Gait Pattern/deviations: Step-to pattern, Decreased stride length, Decreased weight shift to right, Narrow base of support General Gait Details: Pt resisting weight shift to R LE despite max cues and pt education on pushing tendency. L knee block provided and step-by-step VCs provided to seuqence weight shifts and LE advancement with 1-2 steps anterior and posterior each LE x2 bouts. Gait velocity: decreased Gait velocity interpretation: <1.31 ft/sec, indicative of household ambulator    ADL: ADL Overall ADL's : Needs assistance/impaired Eating/Feeding: Maximal assistance Eating/Feeding Details (indicate cue type and reason): unsafe due to impulsive Grooming: Wash/dry face, Minimal assistance,  Sitting Upper Body Bathing: Moderate assistance Lower Body Bathing: Maximal assistance Upper Body Dressing : Moderate assistance Lower Body Dressing: Maximal assistance Toilet Transfer: Moderate assistance, Stand-pivot, Cueing for sequencing, Cueing for safety Toilet Transfer Details (indicate cue type and reason): pt completed x2 stand pivots to [facility] with MOD A, pt requried step by step cues to intiiate pivotal steps to [facility]. pt required cues for hand placement and tacile cues to bend at hips to sit on seat Toileting- Clothing Manipulation and Hygiene: Sit to/from stand, Total assistance, Bed level Toileting - Clothing Manipulation Details (indicate cue type and reason): after first BM pt requried total A for posterior pericare in standing, however after second BM deferred pericare to bed level d/t pt fatigue and no +2 assist available. Functional mobility during ADLs: Moderate assistance, Cueing for sequencing, Cueing for safety General ADL Comments: pt present with impaired balance, cognitive deficits, decreased activity tolerance and fatigue  Cognition: Cognition Overall Cognitive Status: Impaired/Different from baseline Orientation Level: Oriented X4 Cognition Arousal/Alertness: Awake/alert Behavior During Therapy: Flat affect Overall Cognitive Status: Impaired/Different from baseline Area of Impairment: Attention, Following commands, Safety/judgement, Awareness, Problem solving Orientation Level: Disoriented to, Time (stated today was Thursday March 13, 2020) Current Attention Level: Sustained Memory: Decreased short-term memory Following Commands: Follows one step commands consistently, Follows one step commands with increased time Safety/Judgement: Decreased awareness of safety, Decreased awareness of deficits Awareness: Emergent Problem Solving: Slow processing, Decreased initiation, Difficulty sequencing, Requires verbal cues, Requires tactile cues General Comments: Pt slow and  requiring step-by-step cues to sequence and perform all tasks safely. Pt unaware of deficits as he tends to push towards his L in standing and reports feeling like he is falling to his R.   Blood pressure 120/68, pulse 78, temperature 98.5 F (36.9 C), temperature source Oral, resp. rate 18, height 5\' 11"  (1.803 m), weight 59 kg, SpO2 100 %. Physical Exam Vitals and nursing note reviewed.  Constitutional:      General: He is not in acute distress.    Appearance: He is cachectic.     Comments: Somnolent- --needed sternal rubs for one word answers and briefly opened eyes to command. He kept eyes closed for most of exam and made occasional moaning sounds.   HENT:     Head: Normocephalic and atraumatic.     Right Ear: External ear normal.     Left Ear: External ear normal.     Nose: Nose normal.  Eyes:     General:        Right eye: No discharge.        Left eye: No discharge.     Comments: Keeps eyes closed  Cardiovascular:     Rate and Rhythm: Normal rate and regular rhythm.  Pulmonary:     Effort: Pulmonary effort is normal. No respiratory distress.  Breath sounds: No stridor.  Abdominal:     General: Abdomen is flat. Bowel sounds are normal. There is no distension.  Musculoskeletal:     Cervical back: Normal range of motion and neck supple.     Comments: Pain with attempts at knee extension/flexion.  Left hands with edematous discolored dorsal digits.   Skin:    Comments: Bilateral knees with foam dressings covering ruptured blisters.   Neurological:     Mental Status: He is oriented to person, place, and time. He is lethargic.     Comments: Somnolent but aroused briefly to sternal rubs. He was able to state name as "Shirlee More" and answer orientation questions with choice of two. Did not open eyes or attempt to interact Motor: Limited due to participation  Psychiatric:     Comments: Unable to assess due to somnolence     Results for orders placed or performed during the  hospital encounter of 02/28/20 (from the past 48 hour(s))  Glucose, capillary     Status: Abnormal   Collection Time: 03/03/20 12:27 PM  Result Value Ref Range   Glucose-Capillary 134 (H) 70 - 99 mg/dL    Comment: Glucose reference range applies only to samples taken after fasting for at least 8 hours.   Comment 1 Notify RN    Comment 2 Document in Chart   Glucose, capillary     Status: Abnormal   Collection Time: 03/03/20  5:00 PM  Result Value Ref Range   Glucose-Capillary 108 (H) 70 - 99 mg/dL    Comment: Glucose reference range applies only to samples taken after fasting for at least 8 hours.   Comment 1 Notify RN    Comment 2 Document in Chart   Glucose, capillary     Status: Abnormal   Collection Time: 03/03/20  9:13 PM  Result Value Ref Range   Glucose-Capillary 255 (H) 70 - 99 mg/dL    Comment: Glucose reference range applies only to samples taken after fasting for at least 8 hours.   Comment 1 Notify RN    Comment 2 Document in Chart   Glucose, capillary     Status: Abnormal   Collection Time: 03/04/20  6:15 AM  Result Value Ref Range   Glucose-Capillary 127 (H) 70 - 99 mg/dL    Comment: Glucose reference range applies only to samples taken after fasting for at least 8 hours.  Basic metabolic panel     Status: Abnormal   Collection Time: 03/04/20 11:19 AM  Result Value Ref Range   Sodium 141 135 - 145 mmol/L   Potassium 3.6 3.5 - 5.1 mmol/L   Chloride 109 98 - 111 mmol/L   CO2 21 (L) 22 - 32 mmol/L   Glucose, Bld 142 (H) 70 - 99 mg/dL    Comment: Glucose reference range applies only to samples taken after fasting for at least 8 hours.   BUN 19 8 - 23 mg/dL   Creatinine, Ser 9.37 0.61 - 1.24 mg/dL   Calcium 8.7 (L) 8.9 - 10.3 mg/dL   GFR, Estimated >90 >24 mL/min    Comment: (NOTE) Calculated using the CKD-EPI Creatinine Equation (2021)    Anion gap 11 5 - 15    Comment: Performed at [facility], [address], [place], [place] [address]  CBC with  Differential/Platelet     Status: Abnormal   Collection Time: 03/04/20 11:19 AM  Result Value Ref Range   WBC 13.1 (H) 4.0 - 10.5 K/uL  RBC 3.52 (L) 4.22 - 5.81 MIL/uL   Hemoglobin 11.1 (L) 13.0 - 17.0 g/dL   HCT 65.7 (L) 39 - 52 %   MCV 93.5 80.0 - 100.0 fL   MCH 31.5 26.0 - 34.0 pg   MCHC 33.7 30.0 - 36.0 g/dL   RDW 84.6 96.2 - 95.2 %   Platelets 209 150 - 400 K/uL   nRBC 0.0 0.0 - 0.2 %   Neutrophils Relative % 81 %   Neutro Abs 10.6 (H) 1.7 - 7.7 K/uL   Lymphocytes Relative 6 %   Lymphs Abs 0.8 0.7 - 4.0 K/uL   Monocytes Relative 12 %   Monocytes Absolute 1.6 (H) 0.1 - 1.0 K/uL   Eosinophils Relative 0 %   Eosinophils Absolute 0.0 0.0 - 0.5 K/uL   Basophils Relative 0 %   Basophils Absolute 0.0 0.0 - 0.1 K/uL   Immature Granulocytes 1 %   Abs Immature Granulocytes 0.10 (H) 0.00 - 0.07 K/uL    Comment: Performed at [facility], [address], [place], [place] [address]  Magnesium     Status: Abnormal   Collection Time: 03/04/20 11:19 AM  Result Value Ref Range   Magnesium 1.4 (L) 1.7 - 2.4 mg/dL    Comment: Performed at [facility], [address] [place], [place] [address]  Phosphorus     Status: None   Collection Time: 03/04/20 11:19 AM  Result Value Ref Range   Phosphorus 2.7 2.5 - 4.6 mg/dL    Comment: Performed at [facility], [address], [place], [place] [address]  CK     Status: None   Collection Time: 03/04/20 11:19 AM  Result Value Ref Range   Total CK 343 49.0 - 397.0 U/L    Comment: Performed at [facility], [address], [place], [place] [address]  Glucose, capillary     Status: Abnormal   Collection Time: 03/04/20 12:34 PM  Result Value Ref Range   Glucose-Capillary 123 (H) 70 - 99 mg/dL    Comment: Glucose reference range applies only to samples taken after fasting for at least 8 hours.   Comment 1 Notify RN    Comment 2 Document in Chart   Glucose, capillary     Status: Abnormal   Collection Time: 03/04/20  5:27  PM  Result Value Ref Range   Glucose-Capillary 218 (H) 70 - 99 mg/dL    Comment: Glucose reference range applies only to samples taken after fasting for at least 8 hours.   Comment 1 Notify RN    Comment 2 Document in Chart   Urinalysis, Routine w reflex microscopic Urine, Clean Catch     Status: Abnormal   Collection Time: 03/04/20  5:30 PM  Result Value Ref Range   Color, Urine AMBER (A) YELLOW    Comment: BIOCHEMICALS MAY BE AFFECTED BY COLOR   APPearance CLOUDY (A) CLEAR   Specific Gravity, Urine 1.020 1.005 - 1.030   pH 6.0 5.0 - 8.0   Glucose, UA NEGATIVE NEGATIVE mg/dL   Hgb urine dipstick SMALL (A) NEGATIVE   Bilirubin Urine NEGATIVE NEGATIVE   Ketones, ur NEGATIVE NEGATIVE mg/dL   Protein, ur 30 (A) NEGATIVE mg/dL   Nitrite NEGATIVE NEGATIVE   Leukocytes,Ua LARGE (A) NEGATIVE   RBC / HPF 11-20 0 - 5 RBC/hpf   WBC, UA 21-50 0 - 5 WBC/hpf   Bacteria, UA MANY (A) NONE SEEN   Mucus PRESENT  Comment: Performed at [facility], [address], [place], [place] [address]  Glucose, capillary     Status: None   Collection Time: 03/04/20  9:04 PM  Result Value Ref Range   Glucose-Capillary 84 70 - 99 mg/dL    Comment: Glucose reference range applies only to samples taken after fasting for at least 8 hours.  Basic metabolic panel     Status: Abnormal   Collection Time: 03/05/20  3:56 AM  Result Value Ref Range   Sodium 142 135 - 145 mmol/L   Potassium 3.8 3.5 - 5.1 mmol/L   Chloride 109 98 - 111 mmol/L   CO2 21 (L) 22 - 32 mmol/L   Glucose, Bld 77 70 - 99 mg/dL    Comment: Glucose reference range applies only to samples taken after fasting for at least 8 hours.   BUN 20 8 - 23 mg/dL   Creatinine, Ser 8.67 (H) 0.61 - 1.24 mg/dL   Calcium 8.6 (L) 8.9 - 10.3 mg/dL   GFR, Estimated 54 (L) >60 mL/min    Comment: (NOTE) Calculated using the CKD-EPI Creatinine Equation (2021)    Anion gap 12 5 - 15    Comment: Performed at [facility], [address]  [place], [place] [address]  CBC with Differential/Platelet     Status: Abnormal   Collection Time: 03/05/20  3:56 AM  Result Value Ref Range   WBC 8.9 4.0 - 10.5 K/uL   RBC 3.69 (L) 4.22 - 5.81 MIL/uL   Hemoglobin 11.9 (L) 13.0 - 17.0 g/dL   HCT 93.2 (L) 39 - 52 %   MCV 96.7 80.0 - 100.0 fL   MCH 32.2 26.0 - 34.0 pg   MCHC 33.3 30.0 - 36.0 g/dL   RDW 67.1 24.5 - 80.9 %   Platelets 216 150 - 400 K/uL   nRBC 0.0 0.0 - 0.2 %   Neutrophils Relative % 72 %   Neutro Abs 6.5 1.7 - 7.7 K/uL   Lymphocytes Relative 15 %   Lymphs Abs 1.3 0.7 - 4.0 K/uL   Monocytes Relative 12 %   Monocytes Absolute 1.0 0.1 - 1.0 K/uL   Eosinophils Relative 0 %   Eosinophils Absolute 0.0 0.0 - 0.5 K/uL   Basophils Relative 0 %   Basophils Absolute 0.0 0.0 - 0.1 K/uL   Immature Granulocytes 1 %   Abs Immature Granulocytes 0.05 0.00 - 0.07 K/uL    Comment: Performed at [facility], [address] [place], [place] [address]  Lactic acid, plasma     Status: None   Collection Time: 03/05/20  3:56 AM  Result Value Ref Range   Lactic Acid, Venous 1.8 0.5 - 1.9 mmol/L    Comment: Performed at [facility], [address] [place], [place] [address]  Procalcitonin     Status: None   Collection Time: 03/05/20  3:56 AM  Result Value Ref Range   Procalcitonin 6.53 ng/mL    Comment:        Interpretation: PCT > 2 ng/mL: Systemic infection (sepsis) is likely, unless other causes are known. (NOTE)       Sepsis PCT Algorithm           Lower Respiratory Tract                                      Infection PCT  Algorithm    ----------------------------     ----------------------------         PCT < 0.25 ng/mL                PCT < 0.10 ng/mL          Strongly encourage             Strongly discourage   discontinuation of antibiotics    initiation of antibiotics    ----------------------------     -----------------------------       PCT 0.25 - 0.50 ng/mL            PCT 0.10 - 0.25 ng/mL               OR        >80% decrease in PCT            Discourage initiation of                                            antibiotics      Encourage discontinuation           of antibiotics    ----------------------------     -----------------------------         PCT >= 0.50 ng/mL              PCT 0.26 - 0.50 ng/mL               AND       <80% decrease in PCT              Encourage initiation of                                             antibiotics       Encourage continuation           of antibiotics    ----------------------------     -----------------------------        PCT >= 0.50 ng/mL                  PCT > 0.50 ng/mL               AND         increase in PCT                  Strongly encourage                                      initiation of antibiotics    Strongly encourage escalation           of antibiotics                                     -----------------------------                                           PCT <= 0.25 ng/mL  OR                                        > 80% decrease in PCT                                      Discontinue / Do not initiate                                             antibiotics  Performed at [facility], [address], [place], [place] [address]   Magnesium     Status: None   Collection Time: 03/05/20  3:56 AM  Result Value Ref Range   Magnesium 1.7 1.7 - 2.4 mg/dL    Comment: Performed at [facility], [address], [place], [place] [address]  Phosphorus     Status: None   Collection Time: 03/05/20  3:56 AM  Result Value Ref Range   Phosphorus 3.8 2.5 - 4.6 mg/dL    Comment: Performed at [facility], [address], [place], [place] [address]  Glucose, capillary     Status: None   Collection Time: 03/05/20  6:19 AM  Result Value Ref Range   Glucose-Capillary 76 70 - 99 mg/dL    Comment: Glucose reference range applies only to samples taken after fasting for at least 8  hours.   Comment 1 Notify RN    Comment 2 Document in Chart   CBC     Status: Abnormal   Collection Time: 03/05/20  7:30 AM  Result Value Ref Range   WBC 8.9 4.0 - 10.5 K/uL   RBC 3.60 (L) 4.22 - 5.81 MIL/uL   Hemoglobin 11.2 (L) 13.0 - 17.0 g/dL   HCT 33.0 (L) 39 - 52 %   MCV 96.1 80.0 - 100.0 fL   MCH 31.1 26.0 - 34.0 pg   MCHC 32.4 30.0 - 36.0 g/dL   RDW 07.6 22.6 - 33.3 %   Platelets 201 150 - 400 K/uL   nRBC 0.0 0.0 - 0.2 %    Comment: Performed at [facility], [address], [place], [place] [address]  Comprehensive metabolic panel     Status: Abnormal   Collection Time: 03/05/20  7:30 AM  Result Value Ref Range   Sodium 142 135 - 145 mmol/L   Potassium 3.8 3.5 - 5.1 mmol/L   Chloride 108 98 - 111 mmol/L   CO2 23 22 - 32 mmol/L   Glucose, Bld 103 (H) 70 - 99 mg/dL    Comment: Glucose reference range applies only to samples taken after fasting for at least 8 hours.   BUN 20 8 - 23 mg/dL   Creatinine, Ser 5.63 (H) 0.61 - 1.24 mg/dL   Calcium 8.8 (L) 8.9 - 10.3 mg/dL   Total Protein 5.8 (L) 6.5 - 8.1 g/dL   Albumin 3.0 (L) 3.5 - 5.0 g/dL   AST 41 15 - 41 U/L   ALT 31 0 - 44 U/L   Alkaline Phosphatase 79 38 - 126 U/L   Total Bilirubin 0.7 0.3 - 1.2 mg/dL   GFR, Estimated 47 (L) >60 mL/min  Comment: (NOTE) Calculated using the CKD-EPI Creatinine Equation (2021)    Anion gap 11 5 - 15    Comment: Performed at [facility], [address], [place], [place] [address]  Glucose, capillary     Status: None   Collection Time: 03/05/20  8:11 AM  Result Value Ref Range   Glucose-Capillary 85 70 - 99 mg/dL    Comment: Glucose reference range applies only to samples taken after fasting for at least 8 hours.   DG Chest Port 1 View  Result Date: 03/05/2020 CLINICAL DATA:  Shortness of breath. EXAM: PORTABLE CHEST 1 VIEW COMPARISON:  March 04, 2020. FINDINGS: The heart size and mediastinal contours are within normal limits. Both lungs are clear. No pneumothorax  or pleural effusion is noted. Sternotomy wires are noted. The visualized skeletal structures are unremarkable. IMPRESSION: No active disease. Electronically Signed   By: Lupita Raider M.D.   On: 03/05/2020 08:46   DG CHEST PORT 1 VIEW  Result Date: 03/04/2020 CLINICAL DATA:  Fevers EXAM: PORTABLE CHEST 1 VIEW COMPARISON:  03/02/2020 FINDINGS: Cardiac shadow is stable. Postsurgical changes are again seen. Lungs are well aerated bilaterally. No focal infiltrate is seen. Postsurgical changes in the right shoulder are noted. Contrast is again seen in the colon. IMPRESSION: No acute abnormality noted. Electronically Signed   By: Alcide Clever M.D.   On: 03/04/2020 16:32       Medical Problem List and Plan: 1. 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    -patient may shower  -ELOS/Goals: 22-27 days/min a  Admit to CIR 2.  Antithrombotics: -DVT/anticoagulation:  Pharmaceutical: Other (comment)--Eliquis.   -antiplatelet therapy: N/A--has been off ASA/Brilinta.  3. Chronic HA/Cervicalgia/Pain Management: Was on flexeril prn PTA. Will monitor N/A  Monitor with increased exertion 4. Mood: LCSW to follow for evaluation and support.   -antipsychotic agents: Monitor for now.  5. Neuropsych: This patient is not capable of making decisions on his own behalf. 6. Skin/Wound Care: Routine pressure relief measures. Protein supplement due to evidence of malnutrition.  7. Fluids/Electrolytes/Nutrition: Monitor I/Os.   CMP ordered for tomorrow a.m.  8. Gastroenteritis: Had multiple episodes of N/V on 11/09 with 4 incontinent stools and low grade fever. Monitor for GI virus. 9. HTN: Monitor BP tid--educate on importance of compliance. SBP goal 130-150  Monitor with  increased exertion. 10. ICM/Heart failure with persistent apical thrombus: Monitor for signs and symptoms of fluid overload. Daily weights. Continue Eliquis.  11. A fib: Monitor HR tid--continue Lanoxin and Eliquis.   Monitor with increased activity 12. Acute on chronic renal failure: Likely due to gastroenteritis. SCr 1.55-->1.3-->1.53.   Will recheck lytes in am.   CMP ordered for tomorrow. 13. Electrolytes abnormalities: Resolved post supplementation--recheck magnesium/potassium levels in am.  14. Anemia: Recheck iron levels.    CBC ordered for tomorrow a.m. 15. T2DM: Hgb A1C-6.8.  Will monitor BS ac/hs. Discontinue metformin due to CKD. Add CM restrictions to diet.   Monitor with increased mobility 16. Aspiration Pneumonitis: Started on Augmentin 11/10 due to concerns of infection.   Jacquelynn Cree, PA-C 03/05/2020  I have personally performed a face to face diagnostic evaluation, including, but not limited to relevant history and physical exam findings, of this patient and developed relevant assessment and plan.  Additionally, I have reviewed and  concur with the physician assistant's documentation above.  Maryla Morrow, MD, ABPMR  The patient's status has not changed. Any changes from the pre-admission screening or documentation from the acute chart are noted above.   Maryla Morrow, MD, ABPMR

## 2020-03-05 NOTE — Progress Notes (Signed)
Inpatient Rehabilitation-Admissions Coordinator   Received medical approval from Dr. Jerral Ralph to admit pt to CIR today. Notified pt's daughter [name] of bed offer and plan for CIR admission and she is in agreement. All forms reviewed with [name] and signed. RN and [facility] team aware of plan for admit today.   Please call if questions.   Cheri Rous, OTR/L  Rehab Admissions Coordinator  [phone] 03/05/2020 10:09 AM

## 2020-03-05 NOTE — Discharge Summary (Signed)
PATIENT DETAILS Name: STEPHENS SHREVE Age: 79 y.o. Sex: male Date of Birth: [birth year] MRN: [id number]. Admitting Physician: Emeline General, MD PCP:Sun, Charise Carwin, MD  Admit Date: 02/28/2020 Discharge date: 03/05/2020  Recommendations for Outpatient Follow-up:  1. Follow up with PCP in 1-2 weeks 2. Please obtain CMP/CBC in one week 3. Please follow blood cultures done on 11/10 4. Augmentin x5 days for presumed mild aspiration pneumonia 5. Please consult hospitalist service for acute medical issues if needed while at CIR.  Admitted From:  Home  Disposition: CIR   Home Health: No  Equipment/Devices: None  Discharge Condition: Stable  CODE STATUS: FULL CODE  Diet recommendation:  Diet Order            Diet - low sodium heart healthy           DIET - DYS 1 Room service appropriate? Yes; Fluid consistency: Nectar Thick  Diet effective now                  Brief Summary: See H&P, Labs, Consult and Test reports for all details in brief, patient is a 79 year old male with history of DM-2, HTN, CVA, CAD s/p CABG, COPD-who was brought to the ED for evaluation of left sided weakness.  Found to have acute CVA on further evaluation.  See below for further details.  Brief Hospital Course: Acute right MCA infarct: Thought to be embolic in nature-echo with EF around 45-50%, CT head/neck with intracranial atherosclerosis including a severe proximal right M1 stenosis, LDL 117, A1c 6.8-evaluated by neurology during this hospital stay-initially on IV heparin-however has been resumed on Eliquis.  Per prior notes-suspicion for noncompliance with Eliquis in the outpatient setting.  Evaluated by rehab services-with recommendations to transfer to CIR.  Low-grade fever/leukocytosis on 11/9: No obvious source of infection apparent-however patient has started to cough quite a bit-he is on a dysphagia 1 diet-so some suspicion of aspiration pneumonitis although chest x-ray is negative for  pneumonia.  Procalcitonin level is elevated.  We will go ahead and start him on a 5-day course of Augmentin.  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  History of A. fib/a flutter: Stable-continue digoxin-on Eliquis.  CAD s/p CABG: No anginal symptoms  Insulin-dependent DM-2 (A1c 6.8 on 11/4): CBG stable-continue SSI.  Resume Metformin on discharge from CIR.  Recent Labs    03/04/20 2104 03/05/20 0619 03/05/20 0811  GLUCAP 84 76 85   CKD stage II: Creatinine close to baseline-follow periodically.  Hypernatremia: Resolved.  Likely secondary to dehydration.  Rhabdomyolysis: Mild-managed with supportive care  Discharge Diagnoses:  Active Problems:   CVA (cerebral vascular accident) [facility])   Acute right MCA stroke [facility])   Palliative care encounter   Discharge Instructions:  Activity:  As tolerated with Full fall precautions use walker/cane & assistance as needed  Discharge Instructions    Ambulatory referral to Neurology   Complete by: As directed    An appointment is requested in approximately: 4 weeks   Diet - low sodium heart healthy   Complete by: As directed    Discharge wound care:   Complete by: As directed    Apply Bactroban to bilat knees and face BID, then cover knees with foam dressings and change foam dressings Q 3 days or PRN.  Leave  face wound open to air  02/29/20 0955   Increase activity slowly   Complete by: As directed      Allergies as of 03/05/2020   No Known Allergies     Medication List    STOP taking these medications   aspirin 81 MG tablet   Brilinta 90 MG Tabs tablet Generic drug: ticagrelor   isosorbide mononitrate 30 MG 24 hr tablet Commonly known as: IMDUR    metFORMIN 500 MG tablet Commonly known as: GLUCOPHAGE   metoprolol succinate 50 MG 24 hr tablet Commonly known as: Toprol XL   ramipril 10 MG capsule Commonly known as: ALTACE     TAKE these medications   amoxicillin-clavulanate 400-57 MG/5ML suspension Commonly known as: AUGMENTIN Take 6.3 mLs (500 mg total) by mouth every 12 (twelve) hours for 5 days.   atorvastatin 80 MG tablet Commonly known as: LIPITOR Take 1 tablet (80 mg total) by mouth daily at 6 PM. PLEASE CONTACT OFFICE FOR ADDITIONAL REFILLS FINAL WARNING   cyclobenzaprine 5 MG tablet Commonly known as: FLEXERIL Take 5 mg by mouth at bedtime as needed for muscle spasms.   digoxin 0.125 MG tablet Commonly known as: LANOXIN Take 0.125 mg by mouth daily.   Eliquis 5 MG Tabs tablet Generic drug: apixaban Take 5 mg by mouth 2 (two) times daily.   insulin aspart 100 UNIT/ML injection Commonly known as: novoLOG 0-9 Units, Subcutaneous, 3 times daily with meals CBG < 70: Implement Hypoglycemia Standing Orders  CBG 70 - 120: 0 units CBG 121 - 150: 1 unit CBG 151 - 200: 2 units CBG 201 - 250: 3 units CBG 251 - 300: 5 units CBG 301 - 350: 7 units CBG 351 - 400: 9 units CBG > 400: call MD   nitroGLYCERIN 0.4 MG SL tablet Commonly known as: NITROSTAT PLACE 1 TABLET UNDER THE TONGUE AS DIRECTED. What changed: See the new instructions.            Discharge Care Instructions  (From admission, onward)         Start     Ordered   03/05/20 0000  Discharge wound care:       Comments: Apply Bactroban to bilat knees and face BID, then cover knees with foam dressings and change foam dressings Q 3 days or PRN.  Leave face wound open to air  02/29/20 0955   03/05/20 1021          Follow-up Information    Deatra Harshil, MD. Schedule an appointment as soon as possible for a visit in 2 week(s).   Specialty: Family Medicine Contact information: [address], [address] [place]  [address] [phone]        [facility]. Schedule an appointment as soon as possible for a visit in 4 week(s).   Contact information: [address]     [address] [place] [address] [phone]             No Known Allergies    Consultations:  Neurology  Palliative care  CIR   Other Procedures/Studies: CT ANGIO HEAD W OR WO CONTRAST  Result Date: 02/29/2020 CLINICAL DATA:  Right MCA infarct. EXAM: CT ANGIOGRAPHY HEAD AND NECK TECHNIQUE: Multidetector CT imaging of the head and neck was performed using the standard protocol during bolus administration of intravenous contrast. Multiplanar CT image reconstructions and MIPs were obtained to evaluate the vascular anatomy. Carotid stenosis measurements (when applicable) are obtained utilizing NASCET criteria, using the  distal internal carotid diameter as the denominator. CONTRAST:  75mL OMNIPAQUE IOHEXOL 350 MG/ML SOLN COMPARISON:  Head MRI, head MRA, and neck MRA 02/29/2020 FINDINGS: CT HEAD FINDINGS Brain: A moderate-sized acute to subacute right MCA infarct is again noted with extensive involvement of the basal ganglia. Associated petechial hemorrhage is better demonstrated on MRI. There is no malignant hemorrhagic transformation. Right basal ganglia cytotoxic edema results in partial effacement of the frontal horn of the right lateral ventricle. There is no midline shift. No extra-axial fluid collection is identified. Hypodensities elsewhere in the cerebral white matter bilaterally are nonspecific but compatible with mild chronic small vessel ischemic disease. Chronic cerebellar infarcts are again noted. Vascular: Calcified atherosclerosis at the skull base. Skull: No fracture or suspicious osseous lesion. Sinuses: Paranasal sinuses and mastoid air cells are clear. Orbits: Unremarkable. Review of the MIP images confirms the above findings CTA NECK FINDINGS Aortic arch: Standard 3 vessel aortic  arch with mild atherosclerotic plaque. Less than 50% narrowing of the proximal left subclavian artery due to soft plaque. Right carotid system: Patent with mild scattered atheromatous wall thickening and minimal calcified plaque at the carotid bifurcation. No evidence of dissection or stenosis. Left carotid system: Patent with mild scattered atheromatous wall thickening. No evidence of dissection or stenosis. Vertebral arteries: Patent without evidence of dissection or flow-limiting stenosis. Mild segmental narrowing of the right V3 segment at the C1-2 level. Skeleton: Advanced bilateral C1-2 arthropathy and upper cervical facet arthrosis. Moderate to severe mid to lower cervical disc degeneration. Other neck: No evidence of cervical lymphadenopathy or mass. Upper chest: Clear lung apices. Review of the MIP images confirms the above findings CTA HEAD FINDINGS Anterior circulation: The internal carotid arteries are patent from skull base to carotid termini with atherosclerotic plaque resulting in irregularity of the cavernous and proximal supraclinoid segments without significant associated stenosis. ACAs and MCAs are patent without evidence of a significant A1 or left M1 stenosis. There is a severe 5 mm long stenosis of the proximal right M1 segment with diffuse attenuation of the right MCA more distally. No aneurysm is identified. Posterior circulation: The intracranial vertebral arteries are widely patent to the basilar. Patent PICA and SCA origins are seen bilaterally. The basilar artery is widely patent. There are right larger than left posterior communicating arteries. There are mild right P1, moderate proximal right P3, and moderate to severe mid left P2 stenoses. No aneurysm is identified. Venous sinuses: As permitted by contrast timing, patent. Anatomic variants: None. Review of the MIP images confirms the above findings IMPRESSION: 1. Intracranial atherosclerosis including a severe proximal right M1  stenosis with diffuse attenuation of the right MCA more distally. 2. Moderate to severe left P2 stenosis. 3. Mild cervical carotid atherosclerosis without stenosis. 4. Mild right V3 segment stenosis. 5. Known right MCA infarct. 6. Aortic Atherosclerosis (ICD10-I70.0). Electronically Signed   By: Sebastian Ache M.D.   On: 02/29/2020 11:19   DG Chest 2 View  Result Date: 02/28/2020 CLINICAL DATA:  CVA EXAM: CHEST - 2 VIEW COMPARISON:  02/05/2020 FINDINGS: Prior median sternotomy. Heart and mediastinal contours are within normal limits. No focal opacities or effusions. No acute bony abnormality. IMPRESSION: No active cardiopulmonary disease. Electronically Signed   By: Charlett Nose M.D.   On: 02/28/2020 19:06   CT ANGIO NECK W OR WO CONTRAST  Result Date: 02/29/2020 CLINICAL DATA:  Right MCA infarct. EXAM: CT ANGIOGRAPHY HEAD AND NECK TECHNIQUE: Multidetector CT imaging of the head and neck was performed using the standard  protocol during bolus administration of intravenous contrast. Multiplanar CT image reconstructions and MIPs were obtained to evaluate the vascular anatomy. Carotid stenosis measurements (when applicable) are obtained utilizing NASCET criteria, using the distal internal carotid diameter as the denominator. CONTRAST:  75mL OMNIPAQUE IOHEXOL 350 MG/ML SOLN COMPARISON:  Head MRI, head MRA, and neck MRA 02/29/2020 FINDINGS: CT HEAD FINDINGS Brain: A moderate-sized acute to subacute right MCA infarct is again noted with extensive involvement of the basal ganglia. Associated petechial hemorrhage is better demonstrated on MRI. There is no malignant hemorrhagic transformation. Right basal ganglia cytotoxic edema results in partial effacement of the frontal horn of the right lateral ventricle. There is no midline shift. No extra-axial fluid collection is identified. Hypodensities elsewhere in the cerebral white matter bilaterally are nonspecific but compatible with mild chronic small vessel ischemic  disease. Chronic cerebellar infarcts are again noted. Vascular: Calcified atherosclerosis at the skull base. Skull: No fracture or suspicious osseous lesion. Sinuses: Paranasal sinuses and mastoid air cells are clear. Orbits: Unremarkable. Review of the MIP images confirms the above findings CTA NECK FINDINGS Aortic arch: Standard 3 vessel aortic arch with mild atherosclerotic plaque. Less than 50% narrowing of the proximal left subclavian artery due to soft plaque. Right carotid system: Patent with mild scattered atheromatous wall thickening and minimal calcified plaque at the carotid bifurcation. No evidence of dissection or stenosis. Left carotid system: Patent with mild scattered atheromatous wall thickening. No evidence of dissection or stenosis. Vertebral arteries: Patent without evidence of dissection or flow-limiting stenosis. Mild segmental narrowing of the right V3 segment at the C1-2 level. Skeleton: Advanced bilateral C1-2 arthropathy and upper cervical facet arthrosis. Moderate to severe mid to lower cervical disc degeneration. Other neck: No evidence of cervical lymphadenopathy or mass. Upper chest: Clear lung apices. Review of the MIP images confirms the above findings CTA HEAD FINDINGS Anterior circulation: The internal carotid arteries are patent from skull base to carotid termini with atherosclerotic plaque resulting in irregularity of the cavernous and proximal supraclinoid segments without significant associated stenosis. ACAs and MCAs are patent without evidence of a significant A1 or left M1 stenosis. There is a severe 5 mm long stenosis of the proximal right M1 segment with diffuse attenuation of the right MCA more distally. No aneurysm is identified. Posterior circulation: The intracranial vertebral arteries are widely patent to the basilar. Patent PICA and SCA origins are seen bilaterally. The basilar artery is widely patent. There are right larger than left posterior communicating arteries.  There are mild right P1, moderate proximal right P3, and moderate to severe mid left P2 stenoses. No aneurysm is identified. Venous sinuses: As permitted by contrast timing, patent. Anatomic variants: None. Review of the MIP images confirms the above findings IMPRESSION: 1. Intracranial atherosclerosis including a severe proximal right M1 stenosis with diffuse attenuation of the right MCA more distally. 2. Moderate to severe left P2 stenosis. 3. Mild cervical carotid atherosclerosis without stenosis. 4. Mild right V3 segment stenosis. 5. Known right MCA infarct. 6. Aortic Atherosclerosis (ICD10-I70.0). Electronically Signed   By: Sebastian Ache M.D.   On: 02/29/2020 11:19   CT Cervical Spine Wo Contrast  Result Date: 02/28/2020 CLINICAL DATA:  Neck trauma, weakness. EXAM: CT CERVICAL SPINE WITHOUT CONTRAST TECHNIQUE: Multidetector CT imaging of the cervical spine was performed without intravenous contrast. Multiplanar CT image reconstructions were also generated. COMPARISON:  Same day head and maxillofacial CTs. FINDINGS: Alignment: Straightening of lordosis with slight reversal. Grade 1 C2-3 and C3-4 anterolisthesis. Minimal grade 1 C5-6  retrolisthesis. Skull base and vertebrae: Vertebral body heights are preserved. Multilevel degenerative changes including endplate sclerosis, osteophytosis and Schmorl's node formation. Fusion of the bilateral C2-3 facet joints. Soft tissues and spinal canal: No prevertebral fluid or swelling. No visible canal hematoma. Disc levels: Patent bony spinal canal. Multilevel mild bony neural foraminal narrowing. Upper chest: Left apical scarring. Other: None. IMPRESSION: No acute fracture or traumatic listhesis. Multilevel spondylosis. Electronically Signed   By: Stana Bunting M.D.   On: 02/28/2020 14:47   MR ANGIO HEAD WO CONTRAST  Result Date: 02/29/2020 CLINICAL DATA:  Follow-up examination for acute stroke. EXAM: MRI HEAD WITHOUT CONTRAST MRA HEAD WITHOUT CONTRAST MRA  NECK WITHOUT CONTRAST TECHNIQUE: Multiplanar, multiecho pulse sequences of the brain and surrounding structures were obtained without intravenous contrast. Angiographic images of the Circle of Willis were obtained using MRA technique without intravenous contrast. Angiographic images of the neck were obtained using MRA technique without intravenous contrast. Carotid stenosis measurements (when applicable) are obtained utilizing NASCET criteria, using the distal internal carotid diameter as the denominator. COMPARISON:  Prior CT from 02/28/2020. FINDINGS: MRI HEAD FINDINGS Brain: Generalized age-related cerebral atrophy. Patchy T2/FLAIR hyperintensity within the periventricular and deep white matter both cerebral hemispheres most consistent with chronic small vessel ischemic disease, mild to moderate in nature. Multiple scattered remote cerebellar infarcts noted, right greater than left. Associated scattered foci of chronic hemosiderin staining. Confluent diffusion abnormality involving the right basal ganglia consistent with an acute to subacute right MCA territory infarct. Associated susceptibility artifact compatible with petechial hemorrhage without frank hemorrhagic transformation. Additional patchy cortical and subcortical infarcts noted within the adjacent right frontal operculum. Findings correspond with abnormality on prior CT. Mild localized swelling at the right basal ganglia with partial effacement of the right lateral ventricle without significant midline shift or regional mass effect. No mass lesion. No hydrocephalus or extra-axial fluid collection. Pituitary gland and suprasellar region normal. Vascular: Major intracranial vascular flow voids are maintained. Skull and upper cervical spine: Advanced degenerative changes noted about the dens and C1-2 articulation with degenerative thickening at the tectorial membrane. Resultant mild spinal stenosis at the cervicomedullary junction without frank cord  impingement. Bone marrow signal intensity within normal limits. No scalp soft tissue abnormality. Sinuses/Orbits: Globes and orbital soft tissues within normal limits. Paranasal sinuses are largely clear. No mastoid effusion. Inner ear structures grossly normal. Other: None. MRA HEAD FINDINGS ANTERIOR CIRCULATION: Visualized distal cervical segments of the internal carotid arteries are patent with antegrade flow. Petrous, cavernous, and supraclinoid segments patent without stenosis or other abnormality. A1 segments patent bilaterally. Normal anterior communicating artery complex. Anterior cerebral arteries patent to their distal aspects without stenosis. Left M1 widely patent. Normal left MCA bifurcation. Distal left MCA branches well perfused. On the right, there is focal attenuation of the proximal right M1 segment, measuring 5 mm in length (series 1040, image 10). Finding could reflect a severe high-grade stenosis or possibly partially occlusive and/or recanalized thrombus. No definite corresponding FLAIR signal abnormality seen at this level on corresponding brain MRI. Right M1 and distal MCA branches are patent distally but are markedly attenuated. No visible proximal M2 occlusion. POSTERIOR CIRCULATION: Vertebral arteries patent to the vertebrobasilar junction without stenosis. Right PICA patent. Left PICA not definitely seen. Basilar patent to its distal aspect without stenosis. Superior cerebral arteries patent bilaterally. Left PCA primarily supplied via the basilar. Right PCA supplied via a hypoplastic right P1 segment and robust right posterior communicating artery. Right PCA widely patent to its distal aspect. Moderate stenosis  involving the mid left P2 segment noted (series 1034, image 16). Left PCA otherwise patent distally. No aneurysm. MRA NECK FINDINGS Examination severely limited as the patient was unable to tolerate the full length of the exam, and terminated the study early. Partially visualized  aortic arch grossly within normal limits for caliber. No obvious stenosis about the origin of the great vessels. Both carotid artery systems appear grossly patent within the neck. Vertebral arteries appear grossly patent as well. Evaluation for possible flow-limiting stenosis or other vascular pathology severely limited. IMPRESSION: MRI HEAD IMPRESSION: 1. 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 Associated petechial hemorrhage at the right basal ganglia without frank hemorrhagic transformation or significant regional mass effect. 2. Underlying age-related cerebral atrophy with chronic small vessel ischemic disease, with multiple scattered remote cerebellar infarcts, right greater than left. MRA HEAD IMPRESSION: 1. Severe high-grade stenosis or possibly partially occlusive and/or recanalized thrombus within the proximal right M1 segment. Patent but attenuated flow distally within the right MCA distribution. Follow-up examination with dedicated CTA could be performed for further evaluation as clinically warranted. 2. Moderate left P2 stenosis. Otherwise wide patency of the major intracranial arterial vasculature. MRA NECK IMPRESSION: 1. Severely limited study due to the patient's inability to tolerate the full length of the exam, which was terminated early. 2. Gross patency of the major arterial vasculature of the neck. Examination is otherwise nondiagnostic in evaluation for stenosis or other potential acute vascular pathology. Electronically Signed   By: Rise Mu M.D.   On: 02/29/2020 02:03   MR ANGIO NECK WO CONTRAST  Result Date: 02/29/2020 CLINICAL DATA:  Follow-up examination for acute stroke. EXAM: MRI HEAD WITHOUT CONTRAST MRA HEAD WITHOUT CONTRAST MRA NECK WITHOUT CONTRAST TECHNIQUE: Multiplanar, multiecho pulse sequences of the brain and surrounding structures were obtained without intravenous contrast.  Angiographic images of the Circle of Willis were obtained using MRA technique without intravenous contrast. Angiographic images of the neck were obtained using MRA technique without intravenous contrast. Carotid stenosis measurements (when applicable) are obtained utilizing NASCET criteria, using the distal internal carotid diameter as the denominator. COMPARISON:  Prior CT from 02/28/2020. FINDINGS: MRI HEAD FINDINGS Brain: Generalized age-related cerebral atrophy. Patchy T2/FLAIR hyperintensity within the periventricular and deep white matter both cerebral hemispheres most consistent with chronic small vessel ischemic disease, mild to moderate in nature. Multiple scattered remote cerebellar infarcts noted, right greater than left. Associated scattered foci of chronic hemosiderin staining. Confluent diffusion abnormality involving the right basal ganglia consistent with an acute to subacute right MCA territory infarct. Associated susceptibility artifact compatible with petechial hemorrhage without frank hemorrhagic transformation. Additional patchy cortical and subcortical infarcts noted within the adjacent right frontal operculum. Findings correspond with abnormality on prior CT. Mild localized swelling at the right basal ganglia with partial effacement of the right lateral ventricle without significant midline shift or regional mass effect. No mass lesion. No hydrocephalus or extra-axial fluid collection. Pituitary gland and suprasellar region normal. Vascular: Major intracranial vascular flow voids are maintained. Skull and upper cervical spine: Advanced degenerative changes noted about the dens and C1-2 articulation with degenerative thickening at the tectorial membrane. Resultant mild spinal stenosis at the cervicomedullary junction without frank cord impingement. Bone marrow signal intensity within normal limits. No scalp soft tissue abnormality. Sinuses/Orbits: Globes and orbital soft tissues within normal  limits. Paranasal sinuses are largely clear. No mastoid effusion. Inner ear structures grossly normal. Other: None. MRA HEAD FINDINGS ANTERIOR CIRCULATION: Visualized  distal cervical segments of the internal carotid arteries are patent with antegrade flow. Petrous, cavernous, and supraclinoid segments patent without stenosis or other abnormality. A1 segments patent bilaterally. Normal anterior communicating artery complex. Anterior cerebral arteries patent to their distal aspects without stenosis. Left M1 widely patent. Normal left MCA bifurcation. Distal left MCA branches well perfused. On the right, there is focal attenuation of the proximal right M1 segment, measuring 5 mm in length (series 1040, image 10). Finding could reflect a severe high-grade stenosis or possibly partially occlusive and/or recanalized thrombus. No definite corresponding FLAIR signal abnormality seen at this level on corresponding brain MRI. Right M1 and distal MCA branches are patent distally but are markedly attenuated. No visible proximal M2 occlusion. POSTERIOR CIRCULATION: Vertebral arteries patent to the vertebrobasilar junction without stenosis. Right PICA patent. Left PICA not definitely seen. Basilar patent to its distal aspect without stenosis. Superior cerebral arteries patent bilaterally. Left PCA primarily supplied via the basilar. Right PCA supplied via a hypoplastic right P1 segment and robust right posterior communicating artery. Right PCA widely patent to its distal aspect. Moderate stenosis involving the mid left P2 segment noted (series 1034, image 16). Left PCA otherwise patent distally. No aneurysm. MRA NECK FINDINGS Examination severely limited as the patient was unable to tolerate the full length of the exam, and terminated the study early. Partially visualized aortic arch grossly within normal limits for caliber. No obvious stenosis about the origin of the great vessels. Both carotid artery systems appear grossly  patent within the neck. Vertebral arteries appear grossly patent as well. Evaluation for possible flow-limiting stenosis or other vascular pathology severely limited. IMPRESSION: MRI HEAD IMPRESSION: 1. 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 Associated petechial hemorrhage at the right basal ganglia without frank hemorrhagic transformation or significant regional mass effect. 2. Underlying age-related cerebral atrophy with chronic small vessel ischemic disease, with multiple scattered remote cerebellar infarcts, right greater than left. MRA HEAD IMPRESSION: 1. Severe high-grade stenosis or possibly partially occlusive and/or recanalized thrombus within the proximal right M1 segment. Patent but attenuated flow distally within the right MCA distribution. Follow-up examination with dedicated CTA could be performed for further evaluation as clinically warranted. 2. Moderate left P2 stenosis. Otherwise wide patency of the major intracranial arterial vasculature. MRA NECK IMPRESSION: 1. Severely limited study due to the patient's inability to tolerate the full length of the exam, which was terminated early. 2. Gross patency of the major arterial vasculature of the neck. Examination is otherwise nondiagnostic in evaluation for stenosis or other potential acute vascular pathology. Electronically Signed   By: Rise Mu M.D.   On: 02/29/2020 02:03   MR BRAIN WO CONTRAST  Result Date: 02/29/2020 CLINICAL DATA:  Follow-up examination for acute stroke. EXAM: MRI HEAD WITHOUT CONTRAST MRA HEAD WITHOUT CONTRAST MRA NECK WITHOUT CONTRAST TECHNIQUE: Multiplanar, multiecho pulse sequences of the brain and surrounding structures were obtained without intravenous contrast. Angiographic images of the Circle of Willis were obtained using MRA technique without intravenous contrast. Angiographic images of the neck were obtained using MRA  technique without intravenous contrast. Carotid stenosis measurements (when applicable) are obtained utilizing NASCET criteria, using the distal internal carotid diameter as the denominator. COMPARISON:  Prior CT from 02/28/2020. FINDINGS: MRI HEAD FINDINGS Brain: Generalized age-related cerebral atrophy. Patchy T2/FLAIR hyperintensity within the periventricular and deep white matter both cerebral hemispheres most consistent with chronic small vessel ischemic disease, mild to moderate in nature. Multiple scattered  remote cerebellar infarcts noted, right greater than left. Associated scattered foci of chronic hemosiderin staining. Confluent diffusion abnormality involving the right basal ganglia consistent with an acute to subacute right MCA territory infarct. Associated susceptibility artifact compatible with petechial hemorrhage without frank hemorrhagic transformation. Additional patchy cortical and subcortical infarcts noted within the adjacent right frontal operculum. Findings correspond with abnormality on prior CT. Mild localized swelling at the right basal ganglia with partial effacement of the right lateral ventricle without significant midline shift or regional mass effect. No mass lesion. No hydrocephalus or extra-axial fluid collection. Pituitary gland and suprasellar region normal. Vascular: Major intracranial vascular flow voids are maintained. Skull and upper cervical spine: Advanced degenerative changes noted about the dens and C1-2 articulation with degenerative thickening at the tectorial membrane. Resultant mild spinal stenosis at the cervicomedullary junction without frank cord impingement. Bone marrow signal intensity within normal limits. No scalp soft tissue abnormality. Sinuses/Orbits: Globes and orbital soft tissues within normal limits. Paranasal sinuses are largely clear. No mastoid effusion. Inner ear structures grossly normal. Other: None. MRA HEAD FINDINGS ANTERIOR CIRCULATION:  Visualized distal cervical segments of the internal carotid arteries are patent with antegrade flow. Petrous, cavernous, and supraclinoid segments patent without stenosis or other abnormality. A1 segments patent bilaterally. Normal anterior communicating artery complex. Anterior cerebral arteries patent to their distal aspects without stenosis. Left M1 widely patent. Normal left MCA bifurcation. Distal left MCA branches well perfused. On the right, there is focal attenuation of the proximal right M1 segment, measuring 5 mm in length (series 1040, image 10). Finding could reflect a severe high-grade stenosis or possibly partially occlusive and/or recanalized thrombus. No definite corresponding FLAIR signal abnormality seen at this level on corresponding brain MRI. Right M1 and distal MCA branches are patent distally but are markedly attenuated. No visible proximal M2 occlusion. POSTERIOR CIRCULATION: Vertebral arteries patent to the vertebrobasilar junction without stenosis. Right PICA patent. Left PICA not definitely seen. Basilar patent to its distal aspect without stenosis. Superior cerebral arteries patent bilaterally. Left PCA primarily supplied via the basilar. Right PCA supplied via a hypoplastic right P1 segment and robust right posterior communicating artery. Right PCA widely patent to its distal aspect. Moderate stenosis involving the mid left P2 segment noted (series 1034, image 16). Left PCA otherwise patent distally. No aneurysm. MRA NECK FINDINGS Examination severely limited as the patient was unable to tolerate the full length of the exam, and terminated the study early. Partially visualized aortic arch grossly within normal limits for caliber. No obvious stenosis about the origin of the great vessels. Both carotid artery systems appear grossly patent within the neck. Vertebral arteries appear grossly patent as well. Evaluation for possible flow-limiting stenosis or other vascular pathology severely  limited. IMPRESSION: MRI HEAD IMPRESSION: 1. 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 Associated petechial hemorrhage at the right basal ganglia without frank hemorrhagic transformation or significant regional mass effect. 2. Underlying age-related cerebral atrophy with chronic small vessel ischemic disease, with multiple scattered remote cerebellar infarcts, right greater than left. MRA HEAD IMPRESSION: 1. Severe high-grade stenosis or possibly partially occlusive and/or recanalized thrombus within the proximal right M1 segment. Patent but attenuated flow distally within the right MCA distribution. Follow-up examination with dedicated CTA could be performed for further evaluation as clinically warranted. 2. Moderate left P2 stenosis. Otherwise wide patency of the major intracranial arterial vasculature. MRA NECK IMPRESSION: 1. Severely limited study due to the patient's inability to  tolerate the full length of the exam, which was terminated early. 2. Gross patency of the major arterial vasculature of the neck. Examination is otherwise nondiagnostic in evaluation for stenosis or other potential acute vascular pathology. Electronically Signed   By: Rise Mu M.D.   On: 02/29/2020 02:03   DG Chest Port 1 View  Result Date: 03/05/2020 CLINICAL DATA:  Shortness of breath. EXAM: PORTABLE CHEST 1 VIEW COMPARISON:  March 04, 2020. FINDINGS: The heart size and mediastinal contours are within normal limits. Both lungs are clear. No pneumothorax or pleural effusion is noted. Sternotomy wires are noted. The visualized skeletal structures are unremarkable. IMPRESSION: No active disease. Electronically Signed   By: Lupita Raider M.D.   On: 03/05/2020 08:46   DG CHEST PORT 1 VIEW  Result Date: 03/04/2020 CLINICAL DATA:  Fevers EXAM: PORTABLE CHEST 1 VIEW COMPARISON:  03/02/2020 FINDINGS: Cardiac shadow is stable. Postsurgical  changes are again seen. Lungs are well aerated bilaterally. No focal infiltrate is seen. Postsurgical changes in the right shoulder are noted. Contrast is again seen in the colon. IMPRESSION: No acute abnormality noted. Electronically Signed   By: Alcide Clever M.D.   On: 03/04/2020 16:32   DG Chest Port 1 View  Result Date: 03/02/2020 CLINICAL DATA:  Respiratory distress EXAM: PORTABLE CHEST 1 VIEW COMPARISON:  02/28/2020 chest radiograph. FINDINGS: Intact sternotomy wires. Retained oral contrast in the splenic flexure of the colon. Partially visualized right shoulder arthroplasty. Stable cardiomediastinal silhouette with normal heart size. No pneumothorax. No pleural effusion. Lungs appear clear, with no acute consolidative airspace disease and no pulmonary edema. IMPRESSION: No active disease. Electronically Signed   By: Delbert Phenix M.D.   On: 03/02/2020 10:35   DG Shoulder Left  Result Date: 02/28/2020 CLINICAL DATA:  Fall.  Abrasion to left shoulder. EXAM: LEFT SHOULDER - 2+ VIEW COMPARISON:  None FINDINGS: There is normal bony alignment. No evidence of acute osseous or articular abnormality. Degenerative changes of the acromioclavicular joint. IMPRESSION: No evidence of acute osseous or articular abnormality. Electronically Signed   By: Jackey Loge DO   On: 02/28/2020 13:06   DG Knee Complete 4 Views Left  Result Date: 02/28/2020 CLINICAL DATA:  Fall. EXAM: LEFT KNEE - COMPLETE 4+ VIEW COMPARISON:  No pertinent prior exams are available for comparison. FINDINGS: There is normal bony alignment. No evidence of acute osseous or articular abnormality. Tricompartmental osteoarthritic changes. Atherosclerotic vascular calcifications. IMPRESSION: No evidence of acute osseous or articular abnormality. Electronically Signed   By: Jackey Loge DO   On: 02/28/2020 13:08   DG Knee Complete 4 Views Right  Result Date: 02/28/2020 CLINICAL DATA:  Fall EXAM: RIGHT KNEE - COMPLETE 4+ VIEW COMPARISON:  None.  FINDINGS: Alignment is anatomic. There is no acute fracture. Small joint effusion. Tricompartmental changes of osteoarthritis. Vascular calcifications. IMPRESSION: No acute fracture. Small joint effusion. Tricompartmental osteoarthritis. Electronically Signed   By: Guadlupe Spanish M.D.   On: 02/28/2020 13:08   DG Abd 2 Views  Result Date: 02/28/2020 CLINICAL DATA:  Recent fall with abdominal pain, initial encounter EXAM: ABDOMEN - 2 VIEW COMPARISON:  None. FINDINGS: Scattered large and small bowel gas is noted. No acute bony abnormality is seen. No free air is noted. No mass lesion is noted. IMPRESSION: No acute abnormality noted. Electronically Signed   By: Alcide Clever M.D.   On: 02/28/2020 21:12   DG Swallowing Func-Speech Pathology  Result Date: 03/01/2020 Objective Swallowing Evaluation: Type of Study: MBS-Modified Barium Swallow Study  Patient Details Name: ALQUAN MORRISH MRN: [id number] Date of Birth: 1940-10-08 Today's Date: 03/01/2020 Time: SLP Start Time (ACUTE ONLY): 1629 -SLP Stop Time (ACUTE ONLY): 1651 SLP Time Calculation (min) (ACUTE ONLY): 22 min Past Medical History: Past Medical History: Diagnosis Date . Coronary artery disease   a. 2007 dLAD 100->med rx;  b. 2011 PCI: 80 LAD (Promus 2.75x18); c. 03/2015 MV: predom fixed inf/antapical defefcts w/ ischemia, depressed EF;  c. 04/2015 NSTEMI/PCI: LM nl,LAD 40p ISR, 100d (2.5x20 Promus DES), LCX 33m (3.0x20 Promus DES), RCA 30p, 72m, 70d.  . Diabetes mellitus without complication (HCC)  . Dyslipidemia  . Hx MRSA infection  . Ischemic cardiomyopathy   a. 04/2015 Echo: EF 25-30%, apical AK, Gr 2 DD, triv AI, mild MR, mildly dil LA, PASP . . Tobacco abuse  Past Surgical History: Past Surgical History: Procedure Laterality Date . CARDIAC CATHETERIZATION N/A 05/16/2015  Procedure: Left Heart Cath and Coronary Angiography;  Surgeon: Tonny Bollman, MD;  Location: [facility] INVASIVE CV LAB;  Service: Cardiovascular;  Laterality: N/A; . CARDIAC CATHETERIZATION  N/A 05/16/2015  Procedure: Coronary Stent Intervention;  Surgeon: Tonny Bollman, MD;  Location: [facility] INVASIVE CV LAB;  Service: Cardiovascular;  Laterality: N/A; . TOTAL SHOULDER REPLACEMENT    Right HPI: Pt is a 79 year old male who presented with L-sided weakness, L facial droop, and confusion after being on the ground at home and appears with abrasion on left side of his face. He did not receive tPA and NIH 14-16. 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 It also showed "Associated petechial hemorrhage at the right basal ganglia without frank hemorrhagic transformation or significant regional mass effect", per MRI report. Medical hx of: CAD, DM, tobaccos use and ischemic cardiomyopathy.  Subjective: Pt awake, alert, confused Assessment / Plan / Recommendation CHL IP CLINICAL IMPRESSIONS 03/01/2020 Clinical Impression Pt presents with moderate oropharyngeal dysphagia c/b poor bolus awareness, decreased lingual strength and coordination, piecemeal deglutition, delayed swallow initiation, and reduced laryngeal closure.  These deficits resulted in frank aspiration of thin liquid on 1 of two trials, and transient penetration of nectar thick liquid.  Pt's biggest barrier with PO trials today was oral holding. Pt requried significant cuing and encouragement to swallow.  Pt occasionally benefited from circumlaryngeal massage to cue pharyngeal swallow response. Pt did not aspirate nectar thick liquid during this study, but based on clinical presentation this afternoon at bedside, aspiration of nectar and honey thick liquids suspected at that time as pt presented similarly to what was observed during visualized aspiration of thin liquid during this study.  As pt presently appears to be protecting airway with nectar thick liquid and puree consistencies, recommend continuing current diet, but if pt again shows clinical s/s of aspiration with these consistencies  hold POs.  Check oral cavity for holding as well and cue pt to swallow.  Pt may not accept much PO intake with meals.  SLP to follow. SLP Visit Diagnosis Dysphagia, oropharyngeal phase (R13.12) Attention and concentration deficit following -- Frontal lobe and executive function deficit following -- Impact on safety and function Moderate aspiration risk;Mild aspiration risk   CHL IP TREATMENT RECOMMENDATION 03/01/2020 Treatment Recommendations Therapy as outlined in treatment plan below   Prognosis 03/01/2020 Prognosis for Safe Diet Advancement Good Barriers to Reach Goals -- Barriers/Prognosis Comment -- CHL IP DIET RECOMMENDATION 03/01/2020 SLP Diet Recommendations -- Liquid Administration via Cup;Straw Medication Administration -- Compensations Slow rate;Small sips/bites Postural Changes  Seated upright at 90 degrees   CHL IP OTHER RECOMMENDATIONS 03/01/2020 Recommended Consults -- Oral Care Recommendations -- Other Recommendations Order thickener from pharmacy   CHL IP FOLLOW UP RECOMMENDATIONS 03/01/2020 Follow up Recommendations Inpatient Rehab   CHL IP FREQUENCY AND DURATION 03/01/2020 Speech Therapy Frequency (ACUTE ONLY) min 2x/week Treatment Duration 2 weeks      CHL IP ORAL PHASE 03/01/2020 Oral Phase Impaired Oral - Pudding Teaspoon -- Oral - Pudding Cup -- Oral - Honey Teaspoon -- Oral - Honey Cup Holding of bolus Oral - Nectar Teaspoon -- Oral - Nectar Cup Holding of bolus Oral - Nectar Straw Holding of bolus Oral - Thin Teaspoon -- Oral - Thin Cup Holding of bolus;Premature spillage Oral - Thin Straw -- Oral - Puree Holding of bolus Oral - Mech Soft -- Oral - Regular -- Oral - Multi-Consistency -- Oral - Pill -- Oral Phase - Comment --  CHL IP PHARYNGEAL PHASE 03/01/2020 Pharyngeal Phase Impaired Pharyngeal- Pudding Teaspoon -- Pharyngeal -- Pharyngeal- Pudding Cup -- Pharyngeal -- Pharyngeal- Honey Teaspoon -- Pharyngeal -- Pharyngeal- Honey Cup Delayed swallow initiation-vallecula Pharyngeal Material does not  enter airway Pharyngeal- Nectar Teaspoon -- Pharyngeal -- Pharyngeal- Nectar Cup -- Pharyngeal Material enters airway, remains ABOVE vocal cords then ejected out Pharyngeal- Nectar Straw -- Pharyngeal Material does not enter airway Pharyngeal- Thin Teaspoon -- Pharyngeal -- Pharyngeal- Thin Cup -- Pharyngeal -- Pharyngeal- Thin Straw Delayed swallow initiation-pyriform sinuses;Reduced airway/laryngeal closure;Penetration/Aspiration before swallow;Penetration/Aspiration during swallow;Moderate aspiration Pharyngeal Material enters airway, passes BELOW cords and not ejected out despite cough attempt by patient Pharyngeal- Puree Delayed swallow initiation-vallecula Pharyngeal Material does not enter airway Pharyngeal- Mechanical Soft -- Pharyngeal -- Pharyngeal- Regular -- Pharyngeal -- Pharyngeal- Multi-consistency -- Pharyngeal -- Pharyngeal- Pill -- Pharyngeal -- Pharyngeal Comment --  CHL IP CERVICAL ESOPHAGEAL PHASE 03/01/2020 Cervical Esophageal Phase WFL Pudding Teaspoon -- Pudding Cup -- Honey Teaspoon -- Honey Cup -- Nectar Teaspoon -- Nectar Cup -- Nectar Straw -- Thin Teaspoon -- Thin Cup -- Thin Straw -- Puree -- Mechanical Soft -- Regular -- Multi-consistency -- Pill -- Cervical Esophageal Comment -- Annell Greening Borum 03/01/2020, 5:41 PM              DG Swallowing Func-Speech Pathology  Result Date: 02/29/2020 Objective Swallowing Evaluation: Type of Study: MBS-Modified Barium Swallow Study  Patient Details Name: CEYLON ARENSON MRN: [id number] Date of Birth: March 18, 1941 Today's Date: 02/29/2020 Time: SLP Start Time (ACUTE ONLY): 1220 -SLP Stop Time (ACUTE ONLY): 1240 SLP Time Calculation (min) (ACUTE ONLY): 20 min Past Medical History: Past Medical History: Diagnosis Date . Coronary artery disease   a. 2007 dLAD 100->med rx;  b. 2011 PCI: 80 LAD (Promus 2.75x18); c. 03/2015 MV: predom fixed inf/antapical defefcts w/ ischemia, depressed EF;  c. 04/2015 NSTEMI/PCI: LM nl,LAD 40p ISR, 100d (2.5x20 Promus DES), LCX 54m  (3.0x20 Promus DES), RCA 30p, 64m, 70d.  . Diabetes mellitus without complication (HCC)  . Dyslipidemia  . Hx MRSA infection  . Ischemic cardiomyopathy   a. 04/2015 Echo: EF 25-30%, apical AK, Gr 2 DD, triv AI, mild MR, mildly dil LA, PASP . . Tobacco abuse  Past Surgical History: Past Surgical History: Procedure Laterality Date . CARDIAC CATHETERIZATION N/A 05/16/2015  Procedure: Left Heart Cath and Coronary Angiography;  Surgeon: Tonny Bollman, MD;  Location: [facility] INVASIVE CV LAB;  Service: Cardiovascular;  Laterality: N/A; . CARDIAC CATHETERIZATION N/A 05/16/2015  Procedure: Coronary Stent Intervention;  Surgeon: Tonny Bollman, MD;  Location: [facility] INVASIVE CV LAB;  Service: Cardiovascular;  Laterality: N/A; . TOTAL SHOULDER REPLACEMENT    Right HPI: Pt is a 79 year old male who presented with L-sided weakness, L facial droop, and confusion after being on the ground at home and appears with abrasion on left side of his face. He did not receive tPA and NIH 14-16. 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 It also showed "Associated petechial hemorrhage at the right basal ganglia without frank hemorrhagic transformation or significant regional mass effect", per MRI report. Medical hx of: CAD, DM, tobaccos use and ischemic cardiomyopathy.  Pt with agitation over night but per chart review, appeared with improved mentation/improving left sided strength.  Pt noted to overt cough with consumption of coffee overnight and was made NPO with swallow evaluation ordered.  Pt states he has a h/o a stroke with left sided weakness, chart review yielded findings of TIA.  Subjective: alert Assessment / Plan / Recommendation CHL IP CLINICAL IMPRESSIONS 02/29/2020 Clinical Impression Pt presents with a mild oropharyngeal dysphagia marked primarily by reduced sensorimotor function within the oral phase.  There is reduced labial seal, leading to leaking of liquid boluses  from the left side of mouth; disorganized bolus formation and lingual pumping, retention in the left oral cavity post-swallow (requiring oral suctioning at the end of the study).  Thin liquids spilled into the laryngeal vestibule and were aspirated before and during the swallow. Pt had an explosive cough in reaction to aspiration.  Nectar thick liquids were swallowed safely with no penetration nor aspiration over repeated trials. There was little to no pharyngeal residue post -swallow.  For now, recommend beginning a dysphagia 1 diet with nectar thick liquids.  Pt may have small sips of water outside meals and after oral care.  Crush meds for now.  SLP will follow and can advance diet based on clinical observations without repeating MBS.  SLP Visit Diagnosis Dysphagia, oropharyngeal phase (R13.12) Attention and concentration deficit following -- Frontal lobe and executive function deficit following -- Impact on safety and function Mild aspiration risk   CHL IP TREATMENT RECOMMENDATION 02/29/2020 Treatment Recommendations Therapy as outlined in treatment plan below   No flowsheet data found. CHL IP DIET RECOMMENDATION 02/29/2020 SLP Diet Recommendations Dysphagia 1 (Puree) solids;Nectar thick liquid Liquid Administration via Cup;Straw Medication Administration Crushed with puree Compensations -- Postural Changes --   CHL IP OTHER RECOMMENDATIONS 02/29/2020 Recommended Consults -- Oral Care Recommendations Oral care BID Other Recommendations Order thickener from pharmacy   CHL IP FOLLOW UP RECOMMENDATIONS 02/29/2020 Follow up Recommendations Inpatient Rehab   CHL IP FREQUENCY AND DURATION 02/29/2020 Speech Therapy Frequency (ACUTE ONLY) min 2x/week Treatment Duration 2 weeks      CHL IP ORAL PHASE 02/29/2020 Oral Phase Impaired Oral - Pudding Teaspoon -- Oral - Pudding Cup -- Oral - Honey Teaspoon -- Oral - Honey Cup -- Oral - Nectar Teaspoon -- Oral - Nectar Cup -- Oral - Nectar Straw Left anterior bolus loss;Weak lingual  manipulation;Lingual pumping;Reduced posterior propulsion;Decreased bolus cohesion Oral - Thin Teaspoon -- Oral - Thin Cup -- Oral - Thin Straw Left anterior bolus loss;Weak lingual manipulation;Lingual pumping;Reduced posterior propulsion;Decreased bolus cohesion Oral - Puree Weak lingual manipulation;Lingual pumping;Reduced posterior propulsion;Decreased bolus cohesion Oral - Mech Soft -- Oral - Regular -- Oral - Multi-Consistency -- Oral - Pill -- Oral Phase - Comment --  CHL IP PHARYNGEAL PHASE 02/29/2020 Pharyngeal Phase Impaired Pharyngeal- Pudding Teaspoon -- Pharyngeal -- Pharyngeal- Pudding  Cup -- Pharyngeal -- Pharyngeal- Honey Teaspoon -- Pharyngeal -- Pharyngeal- Honey Cup -- Pharyngeal -- Pharyngeal- Nectar Teaspoon -- Pharyngeal -- Pharyngeal- Nectar Cup -- Pharyngeal -- Pharyngeal- Nectar Straw Delayed swallow initiation-pyriform sinuses;Reduced laryngeal elevation Pharyngeal -- Pharyngeal- Thin Teaspoon -- Pharyngeal -- Pharyngeal- Thin Cup -- Pharyngeal -- Pharyngeal- Thin Straw Delayed swallow initiation-pyriform sinuses;Reduced airway/laryngeal closure;Penetration/Aspiration before swallow;Penetration/Aspiration during swallow;Trace aspiration;Reduced laryngeal elevation Pharyngeal Material enters airway, passes BELOW cords and not ejected out despite cough attempt by patient Pharyngeal- Puree Delayed swallow initiation-vallecula;Reduced laryngeal elevation Pharyngeal -- Pharyngeal- Mechanical Soft -- Pharyngeal -- Pharyngeal- Regular -- Pharyngeal -- Pharyngeal- Multi-consistency -- Pharyngeal -- Pharyngeal- Pill -- Pharyngeal -- Pharyngeal Comment --  No flowsheet data found. Blenda Mounts Laurice 02/29/2020, 3:11 PM              ECHOCARDIOGRAM COMPLETE  Result Date: 02/29/2020    ECHOCARDIOGRAM REPORT   Patient Name:   VERNEL LEWAN Date of Exam: 02/29/2020 Medical Rec #:  [id number]     Height:       71.0 in Accession #:    [id number]    Weight:       130.0 lb Date of Birth:  1940-12-13       BSA:          1.756 m Patient Age:    79 years      BP:           166/78 mmHg Patient Gender: M             HR:           52 bpm. Exam Location:  Inpatient Procedure: 2D Echo, Cardiac Doppler and Color Doppler Indications:    TIA 435.9 / G45.9  History:        Patient has prior history of Echocardiogram examinations, most                 recent 09/08/2015. Cardiomyopathy, CAD; Risk Factors:Current                 Smoker, Dyslipidemia and Diabetes.  Sonographer:    Eulah Pont RDCS Referring Phys: [id number] SRISHTI L BHAGAT IMPRESSIONS  1. LV systolic function is mildly decreased with akinesis at the distal lateral, apical, basal inferior/inferolateral walls. Cannot exclude thrombus at LV apex Would recomm ordering limited echo with Definity to furhter evaluate. . Left ventricular ejection fraction, by estimation, is 45 to 50%. The left ventricle has mildly decreased function. The left ventricle demonstrates regional wall motion abnormalities (see scoring diagram/findings for description). Left ventricular diastolic parameters are  consistent with Grade I diastolic dysfunction (impaired relaxation).  2. Right ventricular systolic function is normal. The right ventricular size is normal.  3. The mitral valve is normal in structure. No evidence of mitral valve regurgitation.  4. The aortic valve is tricuspid. Aortic valve regurgitation is not visualized. Mild aortic valve sclerosis is present, with no evidence of aortic valve stenosis.  5. The inferior vena cava is normal in size with greater than 50% respiratory variability, suggesting right atrial pressure of 3 mmHg. FINDINGS  Left Ventricle: LV systolic function is mildly decreased with akinesis at the distal lateral, apical, basal inferior/inferolateral walls. Cannot exclude thrombus at LV apex Would recomm ordering limited echo with Definity to furhter evaluate. Left ventricular ejection fraction, by estimation, is 45 to 50%. The left ventricle has mildly  decreased function. The left ventricle demonstrates regional wall motion abnormalities. The left ventricular internal cavity size was normal in size. There  is no left ventricular hypertrophy. Left ventricular diastolic parameters are consistent with Grade I diastolic dysfunction (impaired relaxation). Right Ventricle: The right ventricular size is normal. No increase in right ventricular wall thickness. Right ventricular systolic function is normal. Left Atrium: Left atrial size was normal in size. Right Atrium: Right atrial size was normal in size. Pericardium: There is no evidence of pericardial effusion. Mitral Valve: The mitral valve is normal in structure. No evidence of mitral valve regurgitation. Tricuspid Valve: The tricuspid valve is normal in structure. Tricuspid valve regurgitation is mild. Aortic Valve: The aortic valve is tricuspid. Aortic valve regurgitation is not visualized. Mild aortic valve sclerosis is present, with no evidence of aortic valve stenosis. Pulmonic Valve: The pulmonic valve was normal in structure. Pulmonic valve regurgitation is not visualized. Aorta: The aortic root is normal in size and structure. Venous: The inferior vena cava is normal in size with greater than 50% respiratory variability, suggesting right atrial pressure of 3 mmHg. IAS/Shunts: No atrial level shunt detected by color flow Doppler.  LEFT VENTRICLE PLAX 2D LVIDd:         4.20 cm  Diastology LVIDs:         3.20 cm  LV e' medial:    3.08 cm/s LV PW:         0.90 cm  LV E/e' medial:  16.4 LV IVS:        0.90 cm  LV e' lateral:   6.24 cm/s LVOT diam:     2.20 cm  LV E/e' lateral: 8.1 LV SV:         78 LV SV Index:   44 LVOT Area:     3.80 cm  RIGHT VENTRICLE RV S prime:     8.70 cm/s TAPSE (M-mode): 1.6 cm LEFT ATRIUM             Index       RIGHT ATRIUM           Index LA diam:        2.20 cm 1.25 cm/m  RA Area:     13.80 cm LA Vol (A2C):   44.3 ml 25.23 ml/m RA Volume:   32.00 ml  18.23 ml/m LA Vol (A4C):    44.9 ml 25.57 ml/m LA Biplane Vol: 44.9 ml 25.57 ml/m  AORTIC VALVE LVOT Vmax:   90.05 cm/s LVOT Vmean:  58.200 cm/s LVOT VTI:    0.205 m  AORTA Ao Root diam: 3.80 cm Ao Asc diam:  3.10 cm MITRAL VALVE MV Area (PHT): 2.62 cm    SHUNTS MV Decel Time: 290 msec    Systemic VTI:  0.20 m MV E velocity: 50.60 cm/s  Systemic Diam: 2.20 cm MV A velocity: 72.50 cm/s MV E/A ratio:  0.70 Dietrich Pates MD Electronically signed by Dietrich Pates MD Signature Date/Time: 02/29/2020/5:22:09 PM    Final    CT HEAD CODE STROKE WO CONTRAST  Result Date: 02/28/2020 CLINICAL DATA:  79 year old male found down, last seen well 02/25/2020. left face abrasions. Code stroke activated initially, but now canceled. EXAM: CT HEAD WITHOUT CONTRAST TECHNIQUE: Contiguous axial images were obtained from the base of the skull through the vertex without intravenous contrast. COMPARISON:  [facility] hospital brain MRI 06/08/2019, head CT 06/08/2019. FINDINGS: Brain: Confluent new hypodensity throughout the right basal ganglia which are swollen with mass effect on the left lateral ventricle. No midline shift. Nearby patchy opacity compatible with cytotoxic edema involving the superior right operculum (series 3, image 21). Underlying  bilateral basal ganglia dystrophic calcifications. No acute intracranial hemorrhage identified. No ventriculomegaly. Basilar cisterns remain patent. Stable chronic cerebellar infarcts. No other acute cortically based infarct identified. Vascular: Calcified atherosclerosis at the skull base. No right side hyperdense vessel identified. However there is conspicuous hyperdensity of a left MCA branch in the sylvian fissure on coronal image 32, however, this is probably stable since last month (series 8, image 47 of the prior CT). Skull: No skull fracture or No acute osseous abnormality identified. Sinuses/Orbits: Visualized paranasal sinuses and mastoids are stable and well pneumatized. Other: Broad-based left superior convexity  scalp hematoma. There is left forehead and periorbital soft tissue hematoma also. No soft tissue gas. Leftward gaze, otherwise negative orbits. ASPECTS [facility] Stroke Program Early CT Score) Total score (0-10 with 10 being normal): 7 (right side caudate, lentiform, M5 abnormal). IMPRESSION: 1. 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 Mild regional mass effect. No hemorrhagic transformation or midline shift. 2. Otherwise stable noncontrast CT appearance of the brain from last month. 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 3. Left face and scalp soft tissue hematoma. No underlying fracture identified. Electronically Signed   By: Odessa Fleming M.D.   On: 02/28/2020 11:46   ECHOCARDIOGRAM LIMITED  Result Date: 03/01/2020    ECHOCARDIOGRAM LIMITED REPORT   Patient Name:   ARISTON GRANDISON Date of Exam: 03/01/2020 Medical Rec #:  [id number]     Height:       71.0 in Accession #:    [id number]    Weight:       130.0 lb Date of Birth:  [birth year]      BSA:          1.756 m Patient Age:    79 years      BP:           157/89 mmHg Patient Gender: M             HR:           94 bpm. 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 Cardiomyopathy, CAD; Risk Factors:Diabetes,                 Dyslipidemia and Former Smoker.  Sonographer:    Renella Cunas RDCS Referring Phys: [id number] JINDONG XU IMPRESSIONS  1. Left ventricular ejection fraction, by estimation, is approxiamtely 50%.  2. There is an elongated echodensity at the inferior apex of the left ventricle measuring approximately 1 x 2cm at largest dimentions, most consistent with mural thrombus. Does not appear freely mobile. There is hypokinesis to akinesis in this myocardial segment distribution.  3.  Right ventricular systolic function is normal. The right ventricular size is normal. FINDINGS  Left Ventricle: Left ventricular ejection fraction, by estimation, is 50%. Definity contrast agent was given IV to delineate the left ventricular endocardial borders. Right Ventricle: The right ventricular size is normal. No increase in right ventricular wall thickness. Right ventricular systolic function is normal. Nona Dell MD Electronically signed by Nona Dell MD Signature Date/Time: 03/01/2020/2:05:59 PM    Final    CT Maxillofacial Wo Contrast  Result Date: 02/28/2020 CLINICAL DATA:  Facial trauma, weakness. EXAM: CT MAXILLOFACIAL WITHOUT CONTRAST TECHNIQUE: Multidetector  CT imaging of the maxillofacial structures was performed. Multiplanar CT image reconstructions were also generated. COMPARISON:  02/28/2020 CT head. FINDINGS: Osseous: No fracture or mandibular dislocation. Left TMJ osteoarthrosis. Orbits: Globes are intact. Normal appearance of the extraocular muscles and optic nerve. No significant intraorbital fat stranding. Prominence of the left periorbital soft tissues. Sinuses: Clear paranasal sinuses.  No mastoid effusion. Soft tissues: Mild left scalp and facial soft tissue swelling. No walled-off fluid collection. Irregularity of the left perimandibular soft tissues may reflect overlying laceration. Limited intracranial: Please see prior same day head CT. IMPRESSION: No acute osseous abnormality.  Left TMJ osteoarthrosis. Mild left scalp and face soft tissue swelling. No walled-off fluid collection. Left perimandibular soft tissue laceration. Electronically Signed   By: Stana Bunting M.D.   On: 02/28/2020 14:39     TODAY-DAY OF DISCHARGE:  Subjective:   Orlene Plum today has no headache,no chest abdominal pain,no new weakness tingling or numbness, feels much better wants to go home today.   Objective:   Blood pressure 120/68, pulse (!) 59, temperature 98.5 F (36.9 C),  temperature source Oral, resp. rate 18, height 5\' 11"  (1.803 m), weight 59 kg, SpO2 100 %.  Intake/Output Summary (Last 24 hours) at 03/05/2020 1022 Last data filed at 03/04/2020 1800 Gross per 24 hour  Intake 322.68 ml  Output --  Net 322.68 ml   Filed Weights   02/28/20 1217  Weight: 59 kg    Exam: Awake Alert, Oriented *3, No new F.N deficits, Normal affect Nelson.AT,PERRAL Supple Neck,No JVD, No cervical lymphadenopathy appriciated.  Symmetrical Chest wall movement, Good air movement bilaterally, CTAB RRR,No Gallops,Rubs or new Murmurs, No Parasternal Heave +ve B.Sounds, Abd Soft, Non tender, No organomegaly appriciated, No rebound -guarding or rigidity. No Cyanosis, Clubbing or edema, No new Rash or bruise   PERTINENT RADIOLOGIC STUDIES: DG Chest Port 1 View  Result Date: 03/05/2020 CLINICAL DATA:  Shortness of breath. EXAM: PORTABLE CHEST 1 VIEW COMPARISON:  March 04, 2020. FINDINGS: The heart size and mediastinal contours are within normal limits. Both lungs are clear. No pneumothorax or pleural effusion is noted. Sternotomy wires are noted. The visualized skeletal structures are unremarkable. IMPRESSION: No active disease. Electronically Signed   By: Lupita Raider M.D.   On: 03/05/2020 08:46   DG CHEST PORT 1 VIEW  Result Date: 03/04/2020 CLINICAL DATA:  Fevers EXAM: PORTABLE CHEST 1 VIEW COMPARISON:  03/02/2020 FINDINGS: Cardiac shadow is stable. Postsurgical changes are again seen. Lungs are well aerated bilaterally. No focal infiltrate is seen. Postsurgical changes in the right shoulder are noted. Contrast is again seen in the colon. IMPRESSION: No acute abnormality noted. Electronically Signed   By: Alcide Clever M.D.   On: 03/04/2020 16:32     PERTINENT LAB RESULTS: CBC: Recent Labs    03/05/20 0356 03/05/20 0730  WBC 8.9 8.9  HGB 11.9* 11.2*  HCT 35.7* 34.6*  PLT 216 201   CMET CMP     Component Value Date/Time   NA 142 03/05/2020 0730   K 3.8  03/05/2020 0730   CL 108 03/05/2020 0730   CO2 23 03/05/2020 0730   GLUCOSE 103 (H) 03/05/2020 0730   BUN 20 03/05/2020 0730   CREATININE 1.53 (H) 03/05/2020 0730   CALCIUM 8.8 (L) 03/05/2020 0730   PROT 5.8 (L) 03/05/2020 0730   ALBUMIN 3.0 (L) 03/05/2020 0730   AST 41 03/05/2020 0730   ALT 31 03/05/2020 0730   ALKPHOS 79 03/05/2020 0730   BILITOT 0.7  03/05/2020 0730   GFRNONAA 47 (L) 03/05/2020 0730   GFRAA >60 05/18/2015 0625    GFR Estimated Creatinine Clearance: 33.7 mL/min (A) (by C-G formula based on SCr of 1.53 mg/dL (H)). No results for input(s): LIPASE, AMYLASE in the last 72 hours. Recent Labs    03/03/20 0107 03/04/20 1119  CKTOTAL 433* 343   Invalid input(s): POCBNP No results for input(s): DDIMER in the last 72 hours. No results for input(s): HGBA1C in the last 72 hours. No results for input(s): CHOL, HDL, LDLCALC, TRIG, CHOLHDL, LDLDIRECT in the last 72 hours. No results for input(s): TSH, T4TOTAL, T3FREE, THYROIDAB in the last 72 hours.  Invalid input(s): FREET3 No results for input(s): VITAMINB12, FOLATE, FERRITIN, TIBC, IRON, RETICCTPCT in the last 72 hours. Coags: No results for input(s): INR in the last 72 hours.  Invalid input(s): PT Microbiology: Recent Results (from the past 240 hour(s))  Respiratory Panel by RT PCR (Flu A&B, Covid) - Nasopharyngeal Swab     Status: None   Collection Time: 02/28/20  1:21 PM   Specimen: Nasopharyngeal Swab  Result Value Ref Range Status   SARS Coronavirus 2 by RT PCR NEGATIVE NEGATIVE Final    Comment: (NOTE) SARS-CoV-2 target nucleic acids are NOT DETECTED.  The SARS-CoV-2 RNA is generally detectable in upper respiratoy specimens during the acute phase of infection. The lowest concentration of SARS-CoV-2 viral copies this assay can detect is 131 copies/mL. A negative result does not preclude SARS-Cov-2 infection and should not be used as the sole basis for treatment or other patient management decisions. A  negative result may occur with  improper specimen collection/handling, submission of specimen other than nasopharyngeal swab, presence of viral mutation(s) within the areas targeted by this assay, and inadequate number of viral copies (<131 copies/mL). A negative result must be combined with clinical observations, patient history, and epidemiological information. The expected result is Negative.  Fact Sheet for Patients:  https://www.moore.com/  Fact Sheet for Healthcare Providers:  https://www.young.biz/  This test is no t yet approved or cleared by the [place] FDA and  has been authorized for detection and/or diagnosis of SARS-CoV-2 by FDA under an Emergency Use Authorization (EUA). This EUA will remain  in effect (meaning this test can be used) for the duration of the COVID-19 declaration under Section 564(b)(1) of the Act, 21 U.S.C. section 360bbb-3(b)(1), unless the authorization is terminated or revoked sooner.     Influenza A by PCR NEGATIVE NEGATIVE Final   Influenza B by PCR NEGATIVE NEGATIVE Final    Comment: (NOTE) The Xpert Xpress SARS-CoV-2/FLU/RSV assay is intended as an aid in  the diagnosis of influenza from Nasopharyngeal swab specimens and  should not be used as a sole basis for treatment. Nasal washings and  aspirates are unacceptable for Xpert Xpress SARS-CoV-2/FLU/RSV  testing.  Fact Sheet for Patients: https://www.moore.com/  Fact Sheet for Healthcare Providers: https://www.young.biz/  This test is not yet approved or cleared by the [place] FDA and  has been authorized for detection and/or diagnosis of SARS-CoV-2 by  FDA under an Emergency Use Authorization (EUA). This EUA will remain  in effect (meaning this test can be used) for the duration of the  Covid-19 declaration under Section 564(b)(1) of the Act, 21  U.S.C. section 360bbb-3(b)(1), unless the authorization  is  terminated or revoked. Performed at [facility], [address] [place], [place] [address]     FURTHER DISCHARGE INSTRUCTIONS:  Get Medicines reviewed and adjusted: Please take all your  medications with you for your next visit with your Primary MD  Laboratory/radiological data: Please request your Primary MD to go over all hospital tests and procedure/radiological results at the follow up, please ask your Primary MD to get all Hospital records sent to his/her office.  In some cases, they will be blood work, cultures and biopsy results pending at the time of your discharge. Please request that your primary care M.D. goes through all the records of your hospital data and follows up on these results.  Also Note the following: If you experience worsening of your admission symptoms, develop shortness of breath, life threatening emergency, suicidal or homicidal thoughts you must seek medical attention immediately by calling 911 or calling your MD immediately  if symptoms less severe.  You must read complete instructions/literature along with all the possible adverse reactions/side effects for all the Medicines you take and that have been prescribed to you. Take any new Medicines after you have completely understood and accpet all the possible adverse reactions/side effects.   Do not drive when taking Pain medications or sleeping medications (Benzodaizepines)  Do not take more than prescribed Pain, Sleep and Anxiety Medications. It is not advisable to combine anxiety,sleep and pain medications without talking with your primary care practitioner  Special Instructions: If you have smoked or chewed Tobacco  in the last 2 yrs please stop smoking, stop any regular Alcohol  and or any Recreational drug use.  Wear Seat belts while driving.  Please note: You were cared for by a hospitalist during your hospital stay. Once you are discharged, your primary care physician will handle any  further medical issues. Please note that NO REFILLS for any discharge medications will be authorized once you are discharged, as it is imperative that you return to your primary care physician (or establish a relationship with a primary care physician if you do not have one) for your post hospital discharge needs so that they can reassess your need for medications and monitor your lab values.  Total Time spent coordinating discharge including counseling, education and face to face time equals 35 minutes.  SignedJeoffrey Massed 03/05/2020 10:22 AM

## 2020-03-05 NOTE — TOC Transition Note (Signed)
Transition of Care [facility]) - CM/SW Discharge Note   Patient Details  Name: DEMETRIO LEIGHTY MRN: [id number] Date of Birth: 1941/02/27  [facility]) CM/SW Contact:  Kermit Balo, RN Phone Number: 03/05/2020, 10:58 AM   Clinical Narrative:    Pt is discharging to CIR today. CM signing off.    Final next level of care: IP Rehab Facility Barriers to Discharge: No Barriers Identified   Patient Goals and CMS Choice        Discharge Placement                       Discharge Plan and Services                                     Social Determinants of Health (SDOH) Interventions     Readmission Risk Interventions No flowsheet data found.

## 2020-03-06 ENCOUNTER — Inpatient Hospital Stay (HOSPITAL_COMMUNITY): Payer: Medicare PPO | Admitting: Speech Pathology

## 2020-03-06 ENCOUNTER — Inpatient Hospital Stay (HOSPITAL_COMMUNITY): Payer: Medicare PPO

## 2020-03-06 ENCOUNTER — Inpatient Hospital Stay (HOSPITAL_COMMUNITY): Payer: Medicare PPO | Admitting: Occupational Therapy

## 2020-03-06 DIAGNOSIS — I63511 Cerebral infarction due to unspecified occlusion or stenosis of right middle cerebral artery: Secondary | ICD-10-CM

## 2020-03-06 DIAGNOSIS — R7881 Bacteremia: Secondary | ICD-10-CM

## 2020-03-06 LAB — BLOOD CULTURE ID PANEL (REFLEXED) - BCID2

## 2020-03-06 LAB — CBC WITH DIFFERENTIAL/PLATELET
Abs Immature Granulocytes: 0.06 10*3/uL (ref 0.00–0.07)
Basophils Absolute: 0 10*3/uL (ref 0.0–0.1)
Basophils Relative: 0 %
Eosinophils Absolute: 0.1 10*3/uL (ref 0.0–0.5)
Eosinophils Relative: 1 %
HCT: 34.5 % — ABNORMAL LOW (ref 39.0–52.0)
Hemoglobin: 11.4 g/dL — ABNORMAL LOW (ref 13.0–17.0)
Immature Granulocytes: 1 %
Lymphocytes Relative: 19 %
Lymphs Abs: 1.8 10*3/uL (ref 0.7–4.0)
MCH: 31.8 pg (ref 26.0–34.0)
MCHC: 33 g/dL (ref 30.0–36.0)
MCV: 96.1 fL (ref 80.0–100.0)
Monocytes Absolute: 1.3 10*3/uL — ABNORMAL HIGH (ref 0.1–1.0)
Monocytes Relative: 13 %
Neutro Abs: 6.5 10*3/uL (ref 1.7–7.7)
Neutrophils Relative %: 66 %
Platelets: 233 10*3/uL (ref 150–400)
RBC: 3.59 MIL/uL — ABNORMAL LOW (ref 4.22–5.81)
RDW: 12.4 % (ref 11.5–15.5)
WBC: 9.8 10*3/uL (ref 4.0–10.5)
nRBC: 0 % (ref 0.0–0.2)

## 2020-03-06 LAB — GLUCOSE, CAPILLARY
Glucose-Capillary: 111 mg/dL — ABNORMAL HIGH (ref 70–99)
Glucose-Capillary: 185 mg/dL — ABNORMAL HIGH (ref 70–99)
Glucose-Capillary: 193 mg/dL — ABNORMAL HIGH (ref 70–99)
Glucose-Capillary: 246 mg/dL — ABNORMAL HIGH (ref 70–99)

## 2020-03-06 LAB — COMPREHENSIVE METABOLIC PANEL
ALT: 32 U/L (ref 0–44)
AST: 36 U/L (ref 15–41)
Albumin: 2.8 g/dL — ABNORMAL LOW (ref 3.5–5.0)
Alkaline Phosphatase: 69 U/L (ref 38–126)
Anion gap: 11 (ref 5–15)
BUN: 19 mg/dL (ref 8–23)
CO2: 22 mmol/L (ref 22–32)
Calcium: 8.4 mg/dL — ABNORMAL LOW (ref 8.9–10.3)
Chloride: 105 mmol/L (ref 98–111)
Creatinine, Ser: 1.32 mg/dL — ABNORMAL HIGH (ref 0.61–1.24)
GFR, Estimated: 56 mL/min — ABNORMAL LOW (ref >60)
Glucose, Bld: 135 mg/dL — ABNORMAL HIGH (ref 70–99)
Potassium: 4 mmol/L (ref 3.5–5.1)
Sodium: 138 mmol/L (ref 135–145)
Total Bilirubin: 0.5 mg/dL (ref 0.3–1.2)
Total Protein: 5.3 g/dL — ABNORMAL LOW (ref 6.5–8.1)

## 2020-03-06 LAB — MAGNESIUM: Magnesium: 1.7 mg/dL (ref 1.7–2.4)

## 2020-03-06 MED ORDER — CEFAZOLIN SODIUM-DEXTROSE 2-4 GM/100ML-% IV SOLN
2.0000 g | Freq: Three times a day (TID) | INTRAVENOUS | Status: AC
  Administered 2020-03-06 – 2020-04-04 (×90): 2 g via INTRAVENOUS
  Filled 2020-03-06 (×92): qty 100

## 2020-03-06 NOTE — Progress Notes (Signed)
Speech Language Pathology Daily Session Note  Patient Details  Name: [name] MRN: [id number] Date of Birth: 05-26-1940  Today's Date: 03/07/2020 SLP Individual Time: 5885-0277 SLP Individual Time Calculation (min): 58 min  Short Term Goals: Week 1: SLP Short Term Goal 1 (Week 1): Pt will consume current diet with efficient mastication and oral clearance and minimal overt s/sx aspiration with Mod A cueing to reduce oral holding and clear left pocketing. SLP Short Term Goal 2 (Week 1): Pt will consume therapeutic trials of thin H2O with minimal overt s/sx aspiration across 3 sessions to demonstrate readiness for repeat MBSS. SLP Short Term Goal 3 (Week 1): Pt will sustain attention to tasks and functional topics of conversation for 5-7 minute intervals with Min A cues for redirection. SLP Short Term Goal 4 (Week 1): Pt will demonstrate ability to problem solve functional familiar situations with Mod A verbal/visual cues. SLP Short Term Goal 5 (Week 1): Pt will recall new and/or daily information related to therapies with Mod A cues for use of aids or strategies. SLP Short Term Goal 6 (Week 1): Pt will utilize compensatory strategies for speech intelligibility (increased vocal intensity and overarticulation) at the sentence level with Mod A cues.  Skilled Therapeutic Interventions: Pt was seen for skilled ST targeting dysphagia and cognitive-linguistic goals. SLP provided Mod a verbal cues for use of swallow strategies to reduce oral holding and clear left buccal pocketing during consumption of lunch tray dys 1/nectar liquids). Two instances of delayed coughing noted throughout intake when orally holding solid POs. Recommend continue current diet. Pt also highly internally distracted and with language of confusion today, requiring Max A multimodal cues to reduce verbosity during PO intake, sustain attention to tasks, and maintain functional topics of conversation when not eating. He was oriented  to self and month, however disoriented to place and situation (stated he was in an apartment complex in [place]). External aids posted in room to assist with carryover of orientation information. He also required Min A to recall and problem solving with use of call bell to request assistance to get to restroom. Pt left laying in bed with alarm set and needs within reach. Continue per current plan of care.           Pain Pain Assessment Pain Scale: Faces Pain Score: 0-No pain  Therapy/Group: Individual Therapy  [name] 03/07/2020, 2:40 PM

## 2020-03-06 NOTE — Progress Notes (Addendum)
Daughter (tracy) took pt keys and wallet home. Pt in agreement.  Mylo Red, LPN

## 2020-03-06 NOTE — Progress Notes (Signed)
Inpatient Rehabilitation  Patient information reviewed and entered into eRehab system by Sterling Ucci M. Amali Uhls, M.A., CCC/SLP, PPS Coordinator.  Information including medical coding, functional ability and quality indicators will be reviewed and updated through discharge.    

## 2020-03-06 NOTE — Progress Notes (Signed)
PHARMACY - PHYSICIAN COMMUNICATION CRITICAL VALUE ALERT - BLOOD CULTURE IDENTIFICATION (BCID)  [name] is an 79 y.o. male who presented to CIR on 03/05/2020 after inpatient stay for acute CVA.  Assessment:  Pt was started on Augmentin for presumed aspiration pneumonitis after pt experienced low-grade fever and leukocytosis; blood cx drawn prior to transfer to CIR with all three of three bottles now growing MSSA.  Name of physician (or Provider) Contacted: Delle Reining  Current antibiotics: Augmentin  Changes to prescribed antibiotics recommended:  Recommendations accepted by provider -- start Ancef 2g IV Q8H.  Results for orders placed or performed during the hospital encounter of 02/28/20  Blood Culture ID Panel (Reflexed) (Collected: 03/05/2020  7:40 AM)  Result Value Ref Range   Enterococcus faecalis NOT DETECTED NOT DETECTED   Enterococcus Faecium NOT DETECTED NOT DETECTED   Listeria monocytogenes NOT DETECTED NOT DETECTED   Staphylococcus species DETECTED (A) NOT DETECTED   Staphylococcus aureus (BCID) DETECTED (A) NOT DETECTED   Staphylococcus epidermidis NOT DETECTED NOT DETECTED   Staphylococcus lugdunensis NOT DETECTED NOT DETECTED   Streptococcus species NOT DETECTED NOT DETECTED   Streptococcus agalactiae NOT DETECTED NOT DETECTED   Streptococcus pneumoniae NOT DETECTED NOT DETECTED   Streptococcus pyogenes NOT DETECTED NOT DETECTED   A.calcoaceticus-baumannii NOT DETECTED NOT DETECTED   Bacteroides fragilis NOT DETECTED NOT DETECTED   Enterobacterales NOT DETECTED NOT DETECTED   Enterobacter cloacae complex NOT DETECTED NOT DETECTED   Escherichia coli NOT DETECTED NOT DETECTED   Klebsiella aerogenes NOT DETECTED NOT DETECTED   Klebsiella oxytoca NOT DETECTED NOT DETECTED   Klebsiella pneumoniae NOT DETECTED NOT DETECTED   Proteus species NOT DETECTED NOT DETECTED   Salmonella species NOT DETECTED NOT DETECTED   Serratia marcescens NOT DETECTED NOT DETECTED    Haemophilus influenzae NOT DETECTED NOT DETECTED   Neisseria meningitidis NOT DETECTED NOT DETECTED   Pseudomonas aeruginosa NOT DETECTED NOT DETECTED   Stenotrophomonas maltophilia NOT DETECTED NOT DETECTED   Candida albicans NOT DETECTED NOT DETECTED   Candida auris NOT DETECTED NOT DETECTED   Candida glabrata NOT DETECTED NOT DETECTED   Candida krusei NOT DETECTED NOT DETECTED   Candida parapsilosis NOT DETECTED NOT DETECTED   Candida tropicalis NOT DETECTED NOT DETECTED   Cryptococcus neoformans/gattii NOT DETECTED NOT DETECTED   Meth resistant mecA/C and MREJ NOT DETECTED NOT DETECTED    Vernard Gambles, PharmD, BCPS  03/06/2020  1:30 AM

## 2020-03-06 NOTE — Progress Notes (Signed)
Inpatient Rehabilitation Center Individual Statement of Services  Patient Name:  [name]  Date:  03/06/2020  Welcome to the Inpatient Rehabilitation Center.  Our goal is to provide you with an individualized program based on your diagnosis and situation, designed to meet your specific needs.  With this comprehensive rehabilitation program, you will be expected to participate in at least 3 hours of rehabilitation therapies Monday-Friday, with modified therapy programming on the weekends.  Your rehabilitation program will include the following services:  Physical Therapy (PT), Occupational Therapy (OT), Speech Therapy (ST), 24 hour per day rehabilitation nursing, Therapeutic Recreaction (TR), Neuropsychology, Care Coordinator, Rehabilitation Medicine, Nutrition Services, Pharmacy Services and Other  Weekly team conferences will be held on Wedneday to discuss your progress.  Your Inpatient Rehabilitation Care Coordinator will talk with you frequently to get your input and to update you on team discussions.  Team conferences with you and your family in attendance may also be held.  Expected length of stay: 16-19 Days  Overall anticipated outcome: Min A  Depending on your progress and recovery, your program may change. Your Inpatient Rehabilitation Care Coordinator will coordinate services and will keep you informed of any changes. Your Inpatient Rehabilitation Care Coordinator's name and contact numbers are listed  below.  The following services may also be recommended but are not provided by the Inpatient Rehabilitation Center:    Home Health Rehabiltiation Services  Outpatient Rehabilitation Services    Arrangements will be made to provide these services after discharge if needed.  Arrangements include referral to agencies that provide these services.  Your insurance has been verified to be:  [employer] Your primary doctor is:  Deatra Jt, MS  Pertinent information will be  shared with your doctor and your insurance company.  Inpatient Rehabilitation Care Coordinator:  Lavera Guise, [place] [phone] or [phone]  Information discussed with and copy given to patient by: Andria Rhein, 03/06/2020, 12:12 PM

## 2020-03-06 NOTE — Progress Notes (Signed)
Patient Details  Name: [name] MRN: [id number] Date of Birth: 1940/10/01  Today's Date: 03/06/2020  Hospital Problems: Principal Problem:   MSSA bacteremia Active Problems:   Type II diabetes mellitus (HCC)   Acute right MCA stroke [facility])  Past Medical History:  Past Medical History:  Diagnosis Date  . CHF (congestive heart failure) (HCC)   . Chronic kidney disease   . COPD (chronic obstructive pulmonary disease) (HCC)   . Coronary artery disease    a. 2007 dLAD 100->med rx;  b. 2011 PCI: 80 LAD (Promus 2.75x18); c. 03/2015 MV: predom fixed inf/antapical defefcts w/ ischemia, depressed EF;  c. 04/2015 NSTEMI/PCI: LM nl,LAD 40p ISR, 100d (2.5x20 Promus DES), LCX 62m (3.0x20 Promus DES), RCA 30p, 66m, 70d.   . Diabetes mellitus without complication (HCC)   . Dyslipidemia   . Hx MRSA infection   . Hypertension   . Ischemic cardiomyopathy    a. 04/2015 Echo: EF 25-30%, apical AK, Gr 2 DD, triv AI, mild MR, mildly dil LA, PASP .  . Tobacco abuse    Past Surgical History:  Past Surgical History:  Procedure Laterality Date  . CARDIAC CATHETERIZATION N/A 05/16/2015   Procedure: Left Heart Cath and Coronary Angiography;  Surgeon: Tonny Bollman, MD;  Location: [facility] INVASIVE CV LAB;  Service: Cardiovascular;  Laterality: N/A;  . CARDIAC CATHETERIZATION N/A 05/16/2015   Procedure: Coronary Stent Intervention;  Surgeon: Tonny Bollman, MD;  Location: [facility] INVASIVE CV LAB;  Service: Cardiovascular;  Laterality: N/A;  . TOTAL SHOULDER REPLACEMENT     Right   Social History:  reports that he has quit smoking. He has never used smokeless tobacco. He reports that he does not drink alcohol and does not use drugs.  Family / Support Systems Marital Status: Divorced Children: [name] (Daughter): [phone] Other Supports: [name] [phone] Anticipated Caregiver: [name] ([place]) Ability/Limitations of Caregiver: Daughter lives in [place] (Patient reports to be stay with dtr post  discharge) Caregiver Availability: 24/7  Social History Preferred language: English Religion:  Read: Yes Write: Yes Employment Status: Retired   Abuse/Neglect Abuse/Neglect Assessment Can Be Completed: Yes Physical Abuse: Denies Verbal Abuse: Denies Sexual Abuse: Denies Exploitation of patient/patient's resources: Denies Self-Neglect: Denies  Emotional Status Recent Psychosocial Issues: no Psychiatric History: no Substance Abuse History: no  Patient / Family Perceptions, Expectations & Goals Pt/Family understanding of illness & functional limitations: Unable to reach family. Will continue to attempt Premorbid pt/family roles/activities: Previously independent/drives Anticipated changes in roles/activities/participation: Pt reports daughter assiting post discharge Pt/family expectations/goals: Min A  [employer]: None Premorbid Home Care/DME Agencies: Other (Comment) (Grab bars (toliet), single point cane, WC) Resource referrals recommended: Neuropsychology  Discharge Planning Living Arrangements: Children, Alone Support Systems: Children Type of Residence: Private residence (2 level home (2 steps to enter)) Insurance Resources: Media planner (specify) Multimedia programmer Medicare) Financial Screen Referred: No Living Expenses: Rent Money Management: Patient Does the patient have any problems obtaining your medications?: No Care Coordinator Barriers to Discharge: Lack of/limited family support, Decreased caregiver support Care Coordinator Anticipated Follow Up Needs: HH/OP Expected length of stay: 16-19 Days  Clinical Impression SW entered room, introduced self, explained role and process. SW unable to reach patient daughter or family member listed on chart (daughter phone number goes straight to VM- left VM and relatives is disconnected) SW will continue to attempt. Patient reports he will be going to stay with his daughter at discharge. Sw will  attempt to confirm with family. No questions or concerns. Sw will  continue to follow up.   Andria Rhein 03/06/2020, 12:35 PM

## 2020-03-06 NOTE — Consult Note (Addendum)
I have seen and examined the patient. I have personally reviewed the clinical findings, laboratory findings, microbiological data and imaging studies. The assessment and treatment plan was discussed with the  Advance Practice Provider, [name].  I agree with her/his recommendations except following additions/corrections.  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 Found to have a RT acute to subacute RT MCA infarct. Found to be febrile with elevation of WBC while admitted. Work up remarkable for high grade MSSA bacteremia ( both sets). TTE findings s/o mural thrombus which is not freely mobile. Will need a TEE for r/o endocarditis. He has abrasions in his bilateral knees, left anterior shoulder. Complains of diffuse body pain/malaise. No point tenderness in spine. No joint pain/swelling. No pain/swelling/restricted ROM of the RT prosthetic shoulder. No peripheral stigmata of endocarditis. He has a slightly indurated area in the rt elbow at the previous IV line, no cord palpated. Repeat blood cx after 48 hrs of cefazolin ( 11/12). Continue cefazolin. Will re-assess tomorrow.   Ivan Fraction, MD [facility]   [facility]    Date of Admission:  03/05/2020     Total days of antibiotics 2  Cefazolin day 1                Reason for Consult: MSSA bacteremia     Referring Provider: Nena Rivas  Primary Care Provider: Deatra Rainn, MD   Assessment: [name] is a 79 y.o. male admitted 11/04 with subacute R sided MCA stroke from home after prolonged down time in the home (possibly 3 days). Left sided weakness and facial droop. Cardiac work up with TTE revealed apical  thrombus, h/o tachybrady syndrome +/- atrial flutter. He had a few intermittent low grade fevers since hospital day 3, going on to develope leukocytosis on hospital day 5 with blood cultures growing MSSA in 3/3 bottles collected. The cause of bacteremia is not quite clear yet.   He has several full-thickness wounds with pain overlying the left anterior shoulder abrasion. He has never had a central line. PIV sites look good and non-tender. Previous R AC PIV site has a nodule from removal but no cording, erythema or tenderness to suggest thrombophlebitis. C-spine CT unremarkable for any bone infection, only chronic degenerative changes.   Continue cefazolin via IV. Repeat blood cultures after 48 hrs of cefazolin. Transthoracic echo done recently. Would recommend a TEE for him to evaluate valves better to help define antibiotic treatment duration. He has known apical thrombus. Will follow up with him tomorrow to look for evolution of any new findings concerning for metastatic infection. Has a h/o RIGHT total shoulder replacement with no issues and does not appear to be affected presently.   Based on his poor self care over the last few months and statement to me regarding his wish that he would have just not been found, palliative support may be helpful given life altering event. ?Depression.    Plan: 1. Continue cefazolin  2. Repeat blood cultures after 48 hrs of cefazolin ( 11/12) 3. Would consult cardiology for TEE 4. Consider palliative care for chronic/complex disease support.    Principal Problem:   MSSA bacteremia Active Problems:   Type II diabetes mellitus (HCC)  Acute right MCA stroke (HCC)   . apixaban  5 mg Oral BID  . digoxin  0.125 mg Oral Daily  . influenza vaccine adjuvanted  0.5 mL Intramuscular Tomorrow-1000  . insulin aspart  0-5 Units Subcutaneous QHS  . insulin aspart  0-9 Units Subcutaneous TID WC  . mupirocin cream   Topical BID  . nutrition supplement (JUVEN)  1  packet Oral BID BM  . pneumococcal 23 valent vaccine  0.5 mL Intramuscular Tomorrow-1000  . vitamin C  250 mg Oral Daily    HPI: [name] is a 79 y.o. male admitted to [facility] on 11/03 with code stroke from home brought in by EMS.   In speaking with [name] he does not recall much about details regarding when he was brought in to the hospital. He lives alone in [place], [place]. Has 2 daughters (closest living in [place], [place]) but no immediate family close by. Neighbors come by to check in on him and they found him down at home when the door was open. Last seen normal was 3 days prior to that from interview with EMS. He arrived with multiple full thickness abrasions to left face/eye, left anterolateral shoulder and bilateral knees; L-sided facial droop and L-sided weakness. Dysphasia and slightly disoriented. Head CT revealed subacute appearing R MCA infarct without mass effect. 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 Has not been taking medications much due to complexity of care.   During hospital stay he had a few intermittent low grade fevers (~100-100.5 F) starting on hospital day #3. 11/09 he developed leukocytosis to 13.1K and blood cultures were drawn, now growing MSSA in 3/3 bottles. Was started on Augmentin covering aspiration pneumonitis and discharged to CIR. Since Blood culutres have returned he has been started on therapy with cefazolin.    He mentioned to me that he "wished his neighbors never found him" and he died at home. Most complaints today is generalized pain, worse in the neck but he says that is not new for him. He has a R shoulder implant per patient but no other hardware. Left shoulder is painful overlying full thickness wound. Also complaining of generalized leg pain R>L.     Review of Systems: cough+, multiple abrasions+, leg pain, generalised weakness +a nd malaise +   Past Medical History:  Diagnosis Date  . CHF (congestive heart failure) (HCC)   . Chronic kidney disease   . COPD (chronic obstructive pulmonary disease) (HCC)   . Coronary artery disease    a. 2007 dLAD 100->med rx;  b. 2011 PCI: 80 LAD (Promus 2.75x18); c. 03/2015 MV: predom fixed inf/antapical defefcts w/ ischemia, depressed EF;  c. 04/2015 NSTEMI/PCI: LM nl,LAD 40p ISR, 100d (2.5x20 Promus DES), LCX 63m (3.0x20 Promus DES), RCA 30p, 38m, 70d.   . Diabetes mellitus without complication (HCC)   . Dyslipidemia   . Hx MRSA infection   . Hypertension   . Ischemic cardiomyopathy    a. 04/2015 Echo: EF 25-30%, apical AK, Gr 2 DD, triv AI, mild MR, mildly dil LA, PASP .  . Tobacco abuse     Social History   Tobacco Use  . Smoking status: Former Games developer  . Smokeless tobacco: Never Used  Vaping Use  . Vaping Use: Never used  Substance Use Topics  .  Alcohol use: Never    Alcohol/week: 0.0 standard drinks  . Drug use: Never    History reviewed. No pertinent family history. No Known Allergies  OBJECTIVE: Blood pressure 135/66, pulse 63, temperature (!) 97.4 F (36.3 C), temperature source Oral, resp. rate 16, height 5\' 11"  (1.803 m), weight 65 kg, SpO2 99 %.  Physical Exam Constitutional:      Comments: Sitting up in bed, NAD. He is able to converse and is calm.   HENT:     Mouth/Throat:     Mouth: Mucous membranes are moist.     Pharynx: Oropharynx is clear.  Eyes:     General: No scleral icterus.    Pupils: Pupils are equal, round, and reactive to light.  Cardiovascular:     Rate and Rhythm: Normal rate and regular rhythm.     Heart sounds: No murmur heard.   Pulmonary:     Effort: Pulmonary effort is normal.     Breath sounds: Normal breath sounds.  Abdominal:     General: Bowel sounds are normal. There is no distension.     Palpations: Abdomen is soft.      Tenderness: There is no abdominal tenderness.  Musculoskeletal:     Cervical back: Normal range of motion. Tenderness present.     Comments: R shoulder joint is not tender, warm, erythematous or painful with active ROM. Good strength.  L shoulder passive ROM only mildly painful if direct contact with abrasion.  Can raise both legs off bed R>L.  Lymphadenopathy:     Cervical: No cervical adenopathy.  Skin:    General: Skin is warm and dry.     Capillary Refill: Capillary refill takes less than 2 seconds.     Comments: Dry, healing abrasion to left cheek/orbit with resolving bruising Dry healing full thickness wound to R patella, covered in clean dry dressing Wet appearing full thickness wound to L patella, pink wound bed. Some sloughing of skin. Clean dry dressing. Mild TTP Wet appearing full thickness wound with some yellow sloughed skin left shoulder. Tenderness with palpation. No warmth or erythema appreciated.   Neurological:     Mental Status: He is alert.     Sensory: No sensory deficit.     Motor: Weakness (L upper extremity cannot grip. Can grossly, briefly raise of the bed at elbow. ) present.     Lab Results Lab Results  Component Value Date   WBC 8.9 03/05/2020   HGB 11.2 (L) 03/05/2020   HCT 34.6 (L) 03/05/2020   MCV 96.1 03/05/2020   PLT 201 03/05/2020    Lab Results  Component Value Date   CREATININE 1.32 (H) 03/06/2020   BUN 19 03/06/2020   NA 138 03/06/2020   K 4.0 03/06/2020   CL 105 03/06/2020   CO2 22 03/06/2020    Lab Results  Component Value Date   ALT 32 03/06/2020   AST 36 03/06/2020   ALKPHOS 69 03/06/2020   BILITOT 0.5 03/06/2020     Microbiology: BCx 11/10 >> MSSA growing in 3/3 drawn bottles    13/10, MSN, NP-C [facility]  [email or web address] Pager: [phone] Office: [phone] [facility] Main Line: [phone]

## 2020-03-06 NOTE — Progress Notes (Signed)
   03/06/20 1100  Clinical Encounter Type  Visited With Patient  Visit Type Initial  Referral From Nurse  Consult/Referral To Chaplain  Spiritual Encounters  Spiritual Needs Other (Comment)  Stress Factors  Patient Stress Factors None identified  Family Stress Factors None identified  Chaplain followed up on consult list that patient wanted an AD. Chaplain took paperwork to patient's room and he was asleep. Called his name twice but he did not answer. Chaplain left AD on bedside table.

## 2020-03-06 NOTE — Evaluation (Signed)
Occupational Therapy Assessment and Plan  Patient Details  Name: [name] MRN: [id number] Date of Birth: 10-17-1940  OT Diagnosis: abnormal posture, cognitive deficits, hemiplegia affecting non-dominant side and muscle weakness (generalized) Rehab Potential: Rehab Potential (ACUTE ONLY): Good ELOS: 19-21 days   Today's Date: 03/06/2020 OT Individual Time: 5859-2924 OT Individual Time Calculation (min): 58 min     Hospital Problem: Principal Problem:   MSSA bacteremia Active Problems:   Type II diabetes mellitus ([place])   Acute right MCA stroke [facility])   Past Medical History:  Past Medical History:  Diagnosis Date  . CHF (congestive heart failure) ([place])   . Chronic kidney disease   . COPD (chronic obstructive pulmonary disease) ([place])   . Coronary artery disease    a. 2007 dLAD 100->med rx;  b. 2011 PCI: 80 LAD (Promus 2.75x18); c. 03/2015 MV: predom fixed inf/antapical defefcts w/ ischemia, depressed EF;  c. 04/2015 NSTEMI/PCI: LM nl,LAD 40p ISR, 100d (2.5x20 Promus DES), LCX 68m(3.0x20 Promus DES), RCA 30p, 467m70d.   . Diabetes mellitus without complication ([place]  . Dyslipidemia   . Hx MRSA infection   . Hypertension   . Ischemic cardiomyopathy    a. 04/2015 Echo: EF 25-30%, apical AK, Gr 2 DD, triv AI, mild MR, mildly dil LA, PASP 3573m.  . Tobacco abuse    Past Surgical History:  Past Surgical History:  Procedure Laterality Date  . CARDIAC CATHETERIZATION N/A 05/16/2015   Procedure: Left Heart Cath and Coronary Angiography;  Surgeon: MicSherren MochaD;  Location: [facility];  Service: Cardiovascular;  Laterality: N/A;  . CARDIAC CATHETERIZATION N/A 05/16/2015   Procedure: Coronary Stent Intervention;  Surgeon: MicSherren MochaD;  Location: [facility];  Service: Cardiovascular;  Laterality: N/A;  . TOTAL SHOULDER REPLACEMENT     Right    Assessment & Plan Clinical Impression: Patient is a 79 17o. year old male with recent admission to the hospital  on 02/28/2020 after wellness check (had not been seen for 3 days) and found on the floor with left hemiparesis, facial droop, and multiple abrasions. History taken from chart review due to cognition. He was found to have elevated BP with rhabdomyolysis and hypernatremia. 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 .  Patient transferred to CIR on 03/05/2020 .    Patient currently requires total with basic self-care skills secondary to muscle weakness and muscle paralysis, impaired timing and sequencing, abnormal tone, unbalanced muscle activation and decreased coordination, decreased midline orientation and decreased attention to left, decreased initiation, decreased attention, decreased awareness, decreased problem solving, decreased safety awareness, decreased memory and delayed processing and decreased sitting balance, decreased standing balance, decreased postural control, hemiplegia and decreased balance strategies.  Prior to hospitalization, patient could complete ADls with independent .  Patient will benefit from skilled intervention to decrease level of assist with basic self-care skills and increase independence with basic self-care skills prior to discharge home with care partner.  Anticipate patient will require minimal physical assistance and follow up home health.  OT - End of Session Activity Tolerance: Decreased this session;Tolerates 10 - 20 min activity with multiple rests Endurance Deficit: Yes Endurance Deficit Description: Pt requesting to lay down after completing toilet transfer OT Assessment Rehab Potential (ACUTE ONLY): Good OT Barriers to Discharge: Decreased caregiver support OT Barriers  to Discharge Comments: Will need 24 hr supervision, unsure if family can provide as  his daughter works. OT Patient demonstrates impairments in the following area(s): Balance;Cognition;Endurance;Motor;Safety;Perception OT Basic ADL's Functional Problem(s): Eating;Grooming;Bathing;Toileting;Dressing OT Transfers Functional Problem(s): Toilet;Tub/Shower OT Additional Impairment(s): Fuctional Use of Upper Extremity OT Plan OT Intensity: Minimum of 1-2 x/day, 45 to 90 minutes OT Frequency: 5 out of 7 days OT Duration/Estimated Length of Stay: 19-21 days OT Treatment/Interventions: Balance/vestibular training;Cognitive remediation/compensation;DME/adaptive equipment instruction;Discharge planning;Disease mangement/prevention;Functional electrical stimulation;Neuromuscular re-education;Patient/family education;Therapeutic Exercise;UE/LE Coordination activities;Wheelchair propulsion/positioning;Visual/perceptual remediation/compensation;UE/LE Strength taining/ROM;Therapeutic Activities;Functional mobility training;Pain management;Psychosocial support;Self Care/advanced ADL retraining OT Self Feeding Anticipated Outcome(s): setup assist OT Basic Self-Care Anticipated Outcome(s): min assist OT Toileting Anticipated Outcome(s): min assist OT Bathroom Transfers Anticipated Outcome(s): min assist OT Recommendation Patient destination: Home (daugher's house) Follow Up Recommendations: Home health OT;24 hour supervision/assistance Equipment Recommended: To be determined   OT Evaluation Precautions/Restrictions  Precautions Precautions: Fall Precaution Comments: L hemi, L inattention Restrictions Weight Bearing Restrictions: No  Pain Pain Assessment Pain Scale: Faces Pain Score: 0-No pain Home Living/Prior Functioning Home Living Family/patient expects to be discharged to:: Private residence Living Arrangements: Children Olivia Mackie (daughter)) Available Help at Discharge: Family, Available PRN/intermittently (dc'ing to [place] to daughters place.) Type of Home: House Home Access:  Stairs to enter Technical brewer of Steps: 2 Entrance Stairs-Rails: None Home Layout: Two level, Able to live on main level with bedroom/bathroom Alternate Level Stairs-Number of Steps: a flight of stairs Alternate Level Stairs-Rails: Right, Left, Can reach both Bathroom Shower/Tub: Multimedia programmer: Associate Professor Accessibility: Yes  Lives With: Alone IADL History Homemaking Responsibilities: Yes Meal Prep Responsibility: Primary Laundry Responsibility: Primary Cleaning Responsibility: Primary Current License: Yes Occupation: Retired Prior Function Level of Independence: Independent with basic ADLs, Independent with homemaking with ambulation, Independent with gait  Able to Take Stairs?: Yes Driving: Yes Vocation: Retired Comments: Patient likely dcing to dtr's house in [place], unknown STE, 2 floors with 1/2 bath on 1st floor, Dtr availble 24/7(?) Vision Baseline Vision/History: Wears glasses Wears Glasses: At all times (per pt report) Patient Visual Report: No change from baseline Vision Assessment?: Vision impaired- to be further tested in functional context Perception  Perception: Impaired Inattention/Neglect: Does not attend to left side of body Body Scheme: Pusher to the left Praxis Praxis: Impaired Praxis Impairment Details: Initiation Cognition Overall Cognitive Status: Impaired/Different from baseline Arousal/Alertness: Awake/alert Orientation Level: Person;Place;Situation Person: Oriented Place: Oriented Situation: Oriented Year: 2021 Month: November Day of Week: Correct Memory: Impaired Memory Impairment: Decreased short term memory;Decreased recall of new information Decreased Short Term Memory: Functional basic;Functional complex Immediate Memory Recall: Sock;Blue;Bed Memory Recall Sock: Without Cue Memory Recall Blue: Without Cue Memory Recall Bed: Without Cue Attention: Sustained Sustained Attention: Impaired Sustained Attention  Impairment: Verbal basic;Functional basic Awareness: Impaired Awareness Impairment: Intellectual impairment Problem Solving: Impaired Problem Solving Impairment: Verbal basic;Functional basic Safety/Judgment: Impaired Sensation Sensation Light Touch: Appears Intact Hot/Cold: Not tested Proprioception: Appears Intact Stereognosis: Not tested Additional Comments: Light touch and proprioception intact in BUES with gross testing. Coordination Gross Motor Movements are Fluid and Coordinated: No Fine Motor Movements are Fluid and Coordinated: No Coordination and Movement Description: Pt currently Brunnstrum stage III in the left arm and hand.  He needed mod assist to integrate into bathing and grooming tasks Motor  Motor Motor: Hemiplegia;Abnormal tone;Abnormal postural alignment and control Motor - Skilled Clinical Observations: LUE and LLE hemiplegia  Trunk/Postural Assessment  Cervical Assessment Cervical Assessment: Exceptions to [facility] (forward head) Thoracic Assessment Thoracic Assessment: Within Functional Limits Lumbar  Assessment Lumbar Assessment: Exceptions to [facility] (posterior pelvic tilt) Postural Control Trunk Control: Persistent L lateral lean in sitting  Balance Balance Balance Assessed: Yes Static Sitting Balance Static Sitting - Balance Support: Feet supported Static Sitting - Level of Assistance: 5: Stand by assistance Dynamic Sitting Balance Dynamic Sitting - Balance Support: During functional activity Dynamic Sitting - Level of Assistance: 4: Min assist Static Standing Balance Static Standing - Balance Support: During functional activity Static Standing - Level of Assistance: 2: Max assist Dynamic Standing Balance Dynamic Standing - Balance Support: During functional activity Dynamic Standing - Level of Assistance: 2: Max assist Extremity/Trunk Assessment RUE Assessment RUE Assessment: Within Functional Limits General Strength Comments: Not formally assessed  but WFLs for selfcare tasks LUE Assessment LUE Assessment: Exceptions to [facility] Active Range of Motion (AROM) Comments: shoulder flexion AAROM 0-130 degrees, all other joints AAROM WFLS General Strength Comments: Pt currently Brunnstrum stage III-IV in the arm and the hand.  Mod assist needed for integration as a gross assist during bathing and grooming tasks.  Slight increased flexor tone noted in the digit flexors  Care Tool Care Tool Self Care Eating        Oral Care    Oral Care Assist Level: Supervision/Verbal cueing    Bathing   Body parts bathed by patient: Chest;Abdomen;Left arm;Right upper leg;Left upper leg;Face Body parts bathed by helper: Left lower leg;Right lower leg;Right arm;Front perineal area;Buttocks   Assist Level: Maximal Assistance - Patient 24 - 49%    Upper Body Dressing(including orthotics)   What is the patient wearing?: Pull over shirt   Assist Level: Maximal Assistance - Patient 25 - 49%    Lower Body Dressing (excluding footwear)   What is the patient wearing?: Pants;Incontinence brief Assist for lower body dressing: Total Assistance - Patient < 25%    Putting on/Taking off footwear   What is the patient wearing?: Non-skid slipper socks;Ted hose Assist for footwear: Dependent - Patient 0%       Care Tool Toileting Toileting activity   Assist for toileting: 2 Helpers     Care Tool Bed Mobility Roll left and right activity   Roll left and right assist level: Moderate Assistance - Patient 50 - 74%    Sit to lying activity   Sit to lying assist level: Moderate Assistance - Patient 50 - 74%    Lying to sitting edge of bed activity   Lying to sitting edge of bed assist level: Maximal Assistance - Patient 25 - 49%     Care Tool Transfers Sit to stand transfer   Sit to stand assist level: Maximal Assistance - Patient 25 - 49%    Chair/bed transfer   Chair/bed transfer assist level: Maximal Assistance - Patient 25 - 49%     Toilet transfer    Assist Level: Maximal Assistance - Patient 24 - 49%     Care Tool Cognition Expression of Ideas and Wants Expression of Ideas and Wants: Some difficulty - exhibits some difficulty with expressing needs and ideas (e.g, some words or finishing thoughts) or speech is not clear   Understanding Verbal and Non-Verbal Content Understanding Verbal and Non-Verbal Content: Usually understands - understands most conversations, but misses some part/intent of message. Requires cues at times to understand   Memory/Recall Ability *first 3 days only      Refer to Care Plan for Long Term Goals  SHORT TERM GOAL WEEK 1 OT Short Term Goal 1 (Week 1): Pt will complete UB dressing with  min assist for pullover shirt. OT Short Term Goal 2 (Week 1): Pt will complete LB bathing with mod assist sit to stand for two consecutive sessions. OT Short Term Goal 3 (Week 1): Pt will complete LB dressing with mod assist sit to stand. OT Short Term Goal 4 (Week 1): Pt will use the LUE with min assist for washing the RUE.  Recommendations for other services: None    Skilled Therapeutic Intervention ADL ADL Eating: Supervision/safety Where Assessed-Eating: Bed level Grooming: Minimal assistance Where Assessed-Grooming: Bed level Upper Body Bathing: Minimal assistance Where Assessed-Upper Body Bathing: Wheelchair Lower Body Bathing: Dependent Where Assessed-Lower Body Bathing: Wheelchair;Sitting at sink;Standing at sink Upper Body Dressing: Maximal assistance Where Assessed-Upper Body Dressing: Sitting at sink Lower Body Dressing: Dependent Where Assessed-Lower Body Dressing: Sitting at sink;Standing at sink Toileting: Dependent Where Assessed-Toileting: Bedside Commode Toilet Transfer: Maximal assistance Toilet Transfer Method: Stand pivot Toilet Transfer Equipment: Bedside commode Tub/Shower Transfer: Not assessed Mobility  Bed Mobility Bed Mobility: Supine to Sit Supine to Sit: Maximal Assistance - Patient  - Patient 25-49% Sit to Supine: Maximal Assistance - Patient 25-49% Transfers Stand to Sit: Maximal Assistance - Patient 25-49%   Session Note:  Pt in bed to start session, needed max assist for supine to sit.  He maintained slight head turn to the right in sitting, but would scan across midline to the left to see therapist spontaneously.  He demonstrates increased pushing to the left during standing with pt pushing the [facility] to the right when attempting to step around to it.  Max assist for completion of stand pivot transfer to the [facility] with total +2 (pt 30%) for clothing and toilet hygiene.  Mod instructional cueing for sequencing bathing at the sink with max assist to complete UB bathing and total assist for LB.  Pt returned to bed with max assist at the end of the session.  Call button and phone in reach with safety alarm in place.     Discharge Criteria: Patient will be discharged from OT if patient refuses treatment 3 consecutive times without medical reason, if treatment goals not met, if there is a change in medical status, if patient makes no progress towards goals or if patient is discharged from hospital.  The above assessment, treatment plan, treatment alternatives and goals were discussed and mutually agreed upon: by patient  Mosella Kasa,[place] OTR/L 03/06/2020, 8:24 PM

## 2020-03-06 NOTE — Evaluation (Signed)
Physical Therapy Assessment and Plan  Patient Details  Name: [name] MRN: [id number] Date of Birth: 11-16-1940  PT Diagnosis: Abnormal posture, Abnormality of gait, Ataxic gait, Cognitive deficits, Coordination disorder, Difficulty walking, Hemiparesis non-dominant, Hemiplegia non-dominant, Hypertonia, Hypotonia, Impaired cognition, Impaired sensation and Muscle weakness Rehab Potential: Good ELOS: 16-19 days   Today's Date: 03/06/2020 PT Individual Time: 9629-5284 PT Individual Time Calculation (min): 84 min    Hospital Problem: Principal Problem:   Acute right MCA stroke [facility])   Past Medical History:  Past Medical History:  Diagnosis Date  . CHF (congestive heart failure) ([place] [employer] [place])   . Chronic kidney disease   . COPD (chronic obstructive pulmonary disease) ([place])   . Coronary artery disease    a. 2007 dLAD 100->med rx;  b. 2011 PCI: 80 LAD (Promus 2.75x18); c. 03/2015 MV: predom fixed inf/antapical defefcts w/ ischemia, depressed EF;  c. 04/2015 NSTEMI/PCI: LM nl,LAD 40p ISR, 100d (2.5x20 Promus DES), LCX 29m(3.0x20 Promus DES), RCA 30p, 420m70d.   . Diabetes mellitus without complication [place]  . Dyslipidemia   . Hx MRSA infection   . Hypertension   . Ischemic cardiomyopathy    a. 04/2015 Echo: EF 25-30%, apical AK, Gr 2 DD, triv AI, mild MR, mildly dil LA, PASP 3523m.  . Tobacco abuse    Past Surgical History:  Past Surgical History:  Procedure Laterality Date  . CARDIAC CATHETERIZATION N/A 05/16/2015   Procedure: Left Heart Cath and Coronary Angiography;  Surgeon: MicSherren MochaD;  Location: [facility];  Service: Cardiovascular;  Laterality: N/A;  . CARDIAC CATHETERIZATION N/A 05/16/2015   Procedure: Coronary Stent Intervention;  Surgeon: MicSherren MochaD;  Location: [facility];  Service: Cardiovascular;  Laterality: N/A;  . TOTAL SHOULDER REPLACEMENT     Right    Assessment & Plan Clinical Impression: Patient is a [age]o. 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 History taken from chart review due to cognition. He was found to have elevated BP with rhabdomyolysis and hypernatremia. 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  MRA brain revealed severe partially occlusive and/or recanalized thrombus in right M1 segment, moderate left P2 stenosis and MRA neck limited due to inability to tolerate exam. Echocardiogram with ejection fraction of 50% with elongated echodensity inferior apex consistent with mural thrombus with hypokinesis.   Per records has history of noncompliance and [facility] started on IV heparin and Dr. Xu Erlinda Honglt that stroke cardioembolic due to noncompliance with Eliquis.  Long term BP goal 130-150 give high grade R-MCA stenosis v/s occlusion. Agitation and confusion improving with use of prn Haldol.  Electrolyte abnormalities resolving with hydration but he developed GI distress with lethargy and decline overall mobility on 03/04/2020 due to low-grade fever, multiple episodes of N/V as well as multiple stools. He was found to have leukocytosis with WBC 13.1 with elevated procalcitonin levels.  He has had reports of SOB as well as coughing with meals and Augmentin added today due to concerns of aspiration pneumonitis. Hospital course  further complicated by post stroke dysphagia, started on dysphagia 1 nectar thick diet. Therapy has been ongoing and CIR was recommended due to functional decline.   Patient currently requires  max with mobility secondary to muscle weakness, decreased cardiorespiratoy endurance, impaired timing and sequencing, abnormal tone, unbalanced muscle activation, motor apraxia, decreased coordination and decreased motor planning, decreased midline orientation, decreased attention to left, left side neglect and decreased motor planning, decreased initiation, decreased attention, decreased awareness, decreased problem solving, decreased safety awareness, decreased memory and delayed processing and decreased sitting balance, decreased standing balance, decreased postural control, hemiplegia and decreased balance strategies.  Prior to hospitalization, patient was modified independent  with mobility and lived with Alone in a House home.  Home access is 2 .  Patient will benefit from skilled PT intervention to maximize safe functional mobility, minimize fall risk and decrease caregiver burden for planned discharge home with 24 hour assist.  Anticipate patient will benefit from follow up [facility] at discharge.  PT - End of Session Activity Tolerance: Tolerates 30+ min activity with multiple rests Endurance Deficit: Yes PT Assessment Rehab Potential (ACUTE/IP ONLY): Good PT Barriers to Discharge: Decreased caregiver support;Home environment access/layout;Incontinence;Medication compliance PT Barriers to Discharge Comments: plan to dc to dtrs house in [place], questionnable 24/7 assist/supervision available PT Patient demonstrates impairments in the following area(s): Balance;Behavior;Endurance;Motor;Nutrition;Pain;Perception;Safety;Sensory;Skin Integrity PT Transfers Functional Problem(s): Bed Mobility;Bed to Chair;Car;Furniture PT Locomotion Functional Problem(s): Ambulation;Wheelchair Mobility;Stairs PT Plan PT Intensity: Minimum of 1-2 x/day ,45 to 90 minutes PT Frequency: 5 out of 7 days PT Duration Estimated Length of Stay: 16-19 days PT Treatment/Interventions: Ambulation/gait training;Cognitive  remediation/compensation;DME/adaptive equipment instruction;Discharge planning;Functional mobility training;Pain management;Psychosocial support;Splinting/orthotics;Therapeutic Activities;UE/LE Strength taining/ROM;Visual/perceptual remediation/compensation;Balance/vestibular training;Community reintegration;Disease management/prevention;Functional electrical stimulation;Neuromuscular re-education;Patient/family education;Skin care/wound management;Stair training;Therapeutic Exercise;UE/LE Coordination activities;Wheelchair propulsion/positioning PT Transfers Anticipated Outcome(s): grossly SPV/CGA PT Locomotion Anticipated Outcome(s): grossly SPV/CGA PT Recommendation Recommendations for Other Services: Therapeutic Recreation consult Therapeutic Recreation Interventions: Stress management Follow Up Recommendations: Home health PT;Outpatient PT;24 hour supervision/assistance Patient destination: Home Equipment Recommended: To be determined Equipment Details: patient owns [facility]   PT Evaluation Precautions/Restrictions Precautions Precautions: Fall Precaution Comments: L hemi, L inattention Restrictions Weight Bearing Restrictions: No Home Living/Prior Functioning Home Living Living Arrangements: Children;Alone Available Help at Discharge: Family;Available PRN/intermittently (adult dtr in [place]) Home Access: Stairs to enter Entrance Stairs-Number of Steps: 2 Entrance Stairs-Rails: None Home Layout: Two level;Able to live on main level with bedroom/bathroom Alternate Level Stairs-Number of Steps: a flight of stairs Alternate Level Stairs-Rails: Right;Left;Can reach both Bathroom Shower/Tub: Multimedia programmer: Standard Bathroom Accessibility: Yes  Lives With: Alone Prior Function Level of Independence: Independent with basic ADLs;Independent with homemaking with ambulation;Independent with gait;Independent with transfers  Able to Take Stairs?: Yes Driving: Yes Vocation:  Retired Comments: Patient likely dcing to dtr's house in [place], unknown STE, 2 floors with 1/2 bath on 1st floor, Dtr availble 24/7(?) Vision/Perception  Vision - Assessment Additional Comments: R gaze preference Perception Perception: Impaired Inattention/Neglect: Does not attend to left side of body Praxis Praxis: Impaired Praxis Impairment Details: Initiation;Motor planning  Cognition Overall Cognitive Status: Impaired/Different from baseline Arousal/Alertness: Lethargic Orientation Level: Disoriented to situation;Oriented to person;Disoriented to place;Disoriented to time Attention: Sustained Sustained Attention: Impaired Sustained Attention Impairment: Verbal basic;Functional basic Memory: Impaired Memory Impairment: Decreased short term memory;Decreased recall of new information Decreased Short Term Memory: Functional basic Awareness: Impaired Awareness Impairment: Intellectual impairment Problem Solving: Impaired Problem Solving Impairment: Verbal basic;Functional basic Safety/Judgment: Impaired Sensation Sensation Light Touch: Impaired by gross assessment (difficult to assess due to  patients level of lethargy) Hot/Cold: Not tested Proprioception: Impaired by gross assessment (difficult to assess due to patients level of lethargy) Coordination Gross Motor Movements are Fluid and Coordinated: No Fine Motor Movements are Fluid and Coordinated: No Heel Shin Test: limited L LE Motor  Motor Motor: Hemiplegia;Abnormal tone;Abnormal postural alignment and control Motor - Skilled Clinical Observations: L hemiplegia, L UE> L LE   Trunk/Postural Assessment  Cervical Assessment Cervical Assessment: Exceptions to [facility] (forward head with limited ability to complete lower/upper cervical extension) Thoracic Assessment Thoracic Assessment: Exceptions to [facility] (increased kyphosis) Lumbar Assessment Lumbar Assessment: Exceptions to [facility] (posterior pelvic tilt) Postural Control Postural  Control: Deficits on evaluation Trunk Control: Persistent L lateral lean in sitting Righting Reactions: delayed and inadequate Protective Responses: delayed and inadequate Postural Limitations: unable to reach L outside BOS  Balance Balance Balance Assessed: Yes Standardized Balance Assessment Standardized Balance Assessment:  (FIST: 25/56) Dynamic Sitting Balance Sitting balance - Comments: Close supervision/CGA when reaching outside BOS Extremity Assessment      RLE Assessment RLE Assessment: Within Functional Limits LLE Assessment LLE Assessment: Exceptions to [facility] LLE Strength Left Hip Flexion: 2/5 Left Hip ABduction: 2/5 Left Hip ADduction: 2/5 Left Knee Flexion: 2-/5 Left Knee Extension: 2+/5 Left Ankle Dorsiflexion: 2/5 Left Ankle Plantar Flexion: 2/5 LLE Tone LLE Tone: Hypertonic;Modified Ashworth Body Part - Modified Ashworth Scale: Hamstrings;Quadriceps Hamstrings - Modified Ashworth Scale for Grading Hypertonia LLE: Slight increase in muscle tone, manifested by a catch, followed by minimal resistance throughout the remainder (less than half) of the ROM Quadriceps - Modified Ashworth Scale for Grading Hypertonia LLE: Slight increase in muscle tone, manifested by a catch, followed by minimal resistance throughout the remainder (less than half) of the ROM  Care Tool Care Tool Bed Mobility Roll left and right activity   Roll left and right assist level: Moderate Assistance - Patient 50 - 74%    Sit to lying activity   Sit to lying assist level: Moderate Assistance - Patient 50 - 74%    Lying to sitting edge of bed activity   Lying to sitting edge of bed assist level: Moderate Assistance - Patient 50 - 74%     Care Tool Transfers Sit to stand transfer   Sit to stand assist level: Maximal Assistance - Patient 25 - 49%    Chair/bed transfer   Chair/bed transfer assist level: Maximal Assistance - Patient 25 - 49%     Toilet transfer   Assist Level: Maximal  Assistance - Patient 24 - 49%    Car transfer Car transfer activity did not occur: Safety/medical concerns (due to patient fatigue/weakness)        Care Tool Locomotion Ambulation   Assist level: 2 helpers Assistive device: Hand held assist Max distance: 25  Walk 10 feet activity   Assist level: 2 helpers Assistive device: Hand held assist   Walk 50 feet with 2 turns activity Walk 50 feet with 2 turns activity did not occur: Safety/medical concerns (due to patient fatigue/weakness)      Walk 150 feet activity Walk 150 feet activity did not occur: Safety/medical concerns (due to patient fatigue/weakness)      Walk 10 feet on uneven surfaces activity Walk 10 feet on uneven surfaces activity did not occur: Safety/medical concerns (due to patient fatigue/weakness)      Stairs Stair activity did not occur: Safety/medical concerns (due to patient fatigue/weakness)        Walk up/down 1 step activity Walk up/down 1 step or curb (drop  down) activity did not occur: Safety/medical concerns (due to patient fatigue/weakness)        Walk up/down 4 steps activity      Walk up/down 12 steps activity Walk up/down 12 steps activity did not occur: Safety/medical concerns (due to patient fatigue/weakness)      Pick up small objects from floor Pick up small object from the floor (from standing position) activity did not occur: Safety/medical concerns (due to patient fatigue/weakness)      Wheelchair Will patient use wheelchair at discharge?: Yes     Wheelchair assist level: Dependent - Patient 0% (TIS wc) Max wheelchair distance: 150  Wheel 50 feet with 2 turns activity   Assist Level: Dependent - Patient 0%  Wheel 150 feet activity   Assist Level: Dependent - Patient 0%    Refer to Care Plan for Long Term Goals  SHORT TERM GOAL WEEK 1 PT Short Term Goal 1 (Week 1): Patient will be able to achieve supine > sit with CGA consistently PT Short Term Goal 2 (Week 1): Patient will  transfer bed<> wc with MinA x1 at least 50% of the time PT Short Term Goal 3 (Week 1): Patient will ambulate >8f with MinA x2 with LRAD PT Short Term Goal 4 (Week 1): Patient will complete sit <> stand with no more than ModA x1 and LRAD  Recommendations for other services: Therapeutic Recreation  Stress management  Skilled Therapeutic Intervention Mobility Bed Mobility Bed Mobility: Rolling Right;Rolling Left;Supine to Sit;Sit to Supine;Sitting - Scoot to Edge of Bed Rolling Right: Moderate Assistance - Patient 50-74% Rolling Left: Moderate Assistance - Patient 50-74% Supine to Sit: Moderate Assistance - Patient 50-74% Sitting - Scoot to Edge of Bed: Moderate Assistance - Patient 50-74% Sit to Supine: Moderate Assistance - Patient 50-74% Transfers Transfers: Sit to Stand;Stand to Sit;Stand Pivot Transfers;Squat Pivot Transfers Sit to Stand: Maximal Assistance - Patient 25-49% Stand to Sit: Maximal Assistance - Patient 25-49% Stand Pivot Transfers: Maximal Assistance - Patient 25 - 49% Stand Pivot Transfer Details: Manual facilitation for placement;Manual facilitation for weight shifting;Verbal cues for technique;Verbal cues for sequencing;Tactile cues for initiation;Tactile cues for posture Squat Pivot Transfers: Maximal Assistance - Patient 25-49% Transfer (Assistive device): 1 person hand held assist Locomotion  Gait Ambulation: Yes Gait Assistance: 2 Helpers Gait Distance (Feet): 25 Feet Assistive device: 2 person hand held assist Gait Assistance Details: Verbal cues for gait pattern;Verbal cues for precautions/safety;Verbal cues for sequencing;Verbal cues for technique;Manual facilitation for placement;Manual facilitation for weight shifting;Tactile cues for posture Gait Gait: Yes Gait Pattern: Impaired Gait Pattern: Step-to pattern;Decreased dorsiflexion - left;Decreased step length - left;Left circumduction;Left foot flat;Left genu recurvatum;Decreased weight shift to  left;Ataxic;Narrow base of support Gait velocity: decreased Stairs / Additional Locomotion Stairs: No WArchitect Yes Wheelchair Assistance: Dependent - Patient 0% (patient in TIS wc) Wheelchair Parts Management: Needs assistance Distance: 150  Patient received supine in bed, agreeable to PT eval. He reports 8/10 "all over." RN alerted and provided pain rx at beginning of session. He remains very lethargic throughout eval requiring consistent cues to remain on task and alert. He demonstrates limited functional strength of L LE with poor proprioceptive input resulting in ataxic-like gait pattern. Limited activity tolerance at this time. Patient demonstrating poor insight into deficits attempting to transfer to bedside commode without assist, but he was redirectable by PT. Patient remaining up in TIS wc at end of eval with seatbelt alarm on, call light within reach, nephew at bedside.   Discharge Criteria:  Patient will be discharged from PT if patient refuses treatment 3 consecutive times without medical reason, if treatment goals not met, if there is a change in medical status, if patient makes no progress towards goals or if patient is discharged from hospital.  The above assessment, treatment plan, treatment alternatives and goals were discussed and mutually agreed upon: by patient and by family  Debbora Dus 03/06/2020, 7:38 AM

## 2020-03-06 NOTE — Progress Notes (Addendum)
[place] PHYSICAL MEDICINE & REHABILITATION PROGRESS NOTE   Subjective/Complaints: Closing eyes, not responding to my questions Sitting upright in bed.   ROS: Unable to obtain due to aphasia  Objective:   DG Chest Port 1 View  Result Date: 03/05/2020 CLINICAL DATA:  Shortness of breath. EXAM: PORTABLE CHEST 1 VIEW COMPARISON:  March 04, 2020. FINDINGS: The heart size and mediastinal contours are within normal limits. Both lungs are clear. No pneumothorax or pleural effusion is noted. Sternotomy wires are noted. The visualized skeletal structures are unremarkable. IMPRESSION: No active disease. Electronically Signed   By: Lupita Raider M.D.   On: 03/05/2020 08:46   DG CHEST PORT 1 VIEW  Result Date: 03/04/2020 CLINICAL DATA:  Fevers EXAM: PORTABLE CHEST 1 VIEW COMPARISON:  03/02/2020 FINDINGS: Cardiac shadow is stable. Postsurgical changes are again seen. Lungs are well aerated bilaterally. No focal infiltrate is seen. Postsurgical changes in the right shoulder are noted. Contrast is again seen in the colon. IMPRESSION: No acute abnormality noted. Electronically Signed   By: Alcide Clever M.D.   On: 03/04/2020 16:32   Recent Labs    03/05/20 0356 03/05/20 0730  WBC 8.9 8.9  HGB 11.9* 11.2*  HCT 35.7* 34.6*  PLT 216 201   Recent Labs    03/05/20 0730 03/06/20 0657  NA 142 138  K 3.8 4.0  CL 108 105  CO2 23 22  GLUCOSE 103* 135*  BUN 20 19  CREATININE 1.53* 1.32*  CALCIUM 8.8* 8.4*    Intake/Output Summary (Last 24 hours) at 03/06/2020 0940 Last data filed at 03/06/2020 0236 Gross per 24 hour  Intake 338 ml  Output --  Net 338 ml        Physical Exam: Vital Signs Blood pressure 135/66, pulse 63, temperature (!) 97.4 F (36.3 C), temperature source Oral, resp. rate 16, height 5\' 11"  (1.803 m), weight 65 kg, SpO2 99 %.  General: Somnolent but arousable, No apparent distress HEENT: Head is normocephalic, keeps eyes closed Neck: Supple without JVD or  lymphadenopathy Heart: Reg rate and rhythm. No murmurs rubs or gallops Chest: CTA bilaterally without wheezes, rales, or rhonchi; no distress Abdomen: Soft, non-tender, non-distended, bowel sounds positive. Extremities: No clubbing, cyanosis, or edema. Pulses are 2+ Musculoskeletal:     Cervical back: Normal range of motion and neck supple.     Comments: Pain with attempts at knee extension/flexion.  Left hands with edematous discolored dorsal digits.   Skin:    Comments: Bilateral knees with foam dressings covering ruptured blisters.   Neurological:     Mental Status: He is oriented to person, place, and time. He is lethargic.     Comments: Somnolent but aroused briefly to sternal rubs. He was able to state name as " " and answer orientation questions with choice of two. Did not open eyes or attempt to interact Motor: Limited due to participation  Psychiatric:     Comments: Unable to assess due to somnolence     Assessment/Plan: 1. Functional deficits which require 3+ hours per day of interdisciplinary therapy in a comprehensive inpatient rehab setting.  Physiatrist is providing close team supervision and 24 hour management of active medical problems listed below.  Physiatrist and rehab team continue to assess barriers to discharge/monitor patient progress toward functional and medical goals  Care Tool:  Bathing              Bathing assist Assist Level: Moderate Assistance - Patient 50 - 74%  Upper Body Dressing/Undressing Upper body dressing        Upper body assist      Lower Body Dressing/Undressing Lower body dressing            Lower body assist       Toileting Toileting    Toileting assist Assist for toileting: Total Assistance - Patient < 25%     Transfers Chair/bed transfer  Transfers assist           Locomotion Ambulation   Ambulation assist              Walk 10 feet activity   Assist       Assistive device:   (stedy )   Walk 50 feet activity   Assist           Walk 150 feet activity   Assist           Walk 10 feet on uneven surface  activity   Assist           Wheelchair     Assist               Wheelchair 50 feet with 2 turns activity    Assist            Wheelchair 150 feet activity     Assist          Blood pressure 135/66, pulse 63, temperature (!) 97.4 F (36.3 C), temperature source Oral, resp. rate 16, height 5\' 11"  (1.803 m), weight 65 kg, SpO2 99 %.    Medical Problem List and Plan: 1. 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               -patient may shower             -ELOS/Goals: 22-27 days/min a             Initial CIR evaluations today.  2.  Antithrombotics: -DVT/anticoagulation:  Pharmaceutical: Other (comment)--Eliquis.              -antiplatelet therapy: N/A--has been off ASA/Brilinta.  3. Chronic HA/Cervicalgia/Pain Management: Was on flexeril prn PTA. Will monitor N/A. Pain appears to be well controlled.              Monitor with increased exertion 4. Mood: LCSW to follow for evaluation and support.              -antipsychotic agents: Monitor for now.  5. Neuropsych: This patient is not capable of making decisions on his own behalf. 6. Skin/Wound Care: Routine pressure relief measures. Protein supplement due to evidence of malnutrition.  7. Fluids/Electrolytes/Nutrition: Monitor I/Os.              CMP 11/11 is stable.  8. Gastroenteritis: Had multiple episodes of N/V on 11/09 with 4 incontinent stools and low grade fever. Monitor for GI virus. 9. HTN: Monitor BP tid--educate on importance of compliance. SBP goal 130-150             Monitor with increased exertion. 10.  ICM/Heart failure with persistent apical thrombus: Monitor for signs and symptoms of fluid overload. Daily weights. Continue Eliquis.  11. A fib: Monitor HR tid--continue Lanoxin and Eliquis.              Monitor with increased activity  12. Acute on chronic renal failure: Likely due to gastroenteritis. SCr 1.55-->1.3-->1.53.   Will recheck lytes in am.              Cr trending down on 11/11 13. Electrolytes abnormalities: Resolved post supplementation--recheck magnesium/potassium levels in am.  14. Anemia: Recheck iron levels.                    CBC ordered for tomorrow a.m. 15. T2DM: Hgb A1C-6.8.  Will monitor BS ac/hs. Discontinue metformin due to CKD. Add CM restrictions to diet.              Monitor with increased mobility 16. Aspiration Pneumonitis: Started on Augmentin 11/10 due to concerns of infection.  17. Dysphagia: Initiate CIR SLP   LOS: 1 days A FACE TO FACE EVALUATION WAS PERFORMED  Melford Tullier P Dyani Babel 03/06/2020, 9:40 AM

## 2020-03-07 ENCOUNTER — Inpatient Hospital Stay (HOSPITAL_COMMUNITY): Payer: Medicare PPO

## 2020-03-07 ENCOUNTER — Inpatient Hospital Stay (HOSPITAL_COMMUNITY): Payer: Medicare PPO | Admitting: Speech Pathology

## 2020-03-07 ENCOUNTER — Inpatient Hospital Stay (HOSPITAL_COMMUNITY): Payer: Medicare PPO | Admitting: Occupational Therapy

## 2020-03-07 ENCOUNTER — Inpatient Hospital Stay (HOSPITAL_COMMUNITY): Payer: Medicare PPO | Admitting: Physical Therapy

## 2020-03-07 DIAGNOSIS — E43 Unspecified severe protein-calorie malnutrition: Secondary | ICD-10-CM | POA: Insufficient documentation

## 2020-03-07 LAB — CULTURE, BLOOD (ROUTINE X 2): Special Requests: ADEQUATE

## 2020-03-07 LAB — GLUCOSE, CAPILLARY
Glucose-Capillary: 146 mg/dL — ABNORMAL HIGH (ref 70–99)
Glucose-Capillary: 191 mg/dL — ABNORMAL HIGH (ref 70–99)
Glucose-Capillary: 88 mg/dL (ref 70–99)
Glucose-Capillary: 271 mg/dL — ABNORMAL HIGH (ref 70–99)

## 2020-03-07 NOTE — Progress Notes (Signed)
Pt awake and active all night despite receiving Trazadone. Multiple attempts to leave bed, and intermittent agitation with staff.

## 2020-03-07 NOTE — Progress Notes (Signed)
[facility] Infectious Diseases Follow Up Note  Patient Identification: Patient Name: [name] MRN: [id number] Admit Date: 03/05/2020  3:24 PM Age: 79 y.o.Today's Date: 03/07/2020   Reason for Visit: Follow Up on MSSA bacteremia   Principal Problem:   MSSA bacteremia Active Problems:   Type II diabetes mellitus (HCC)   Acute right MCA stroke (HCC)   Antibiotics: Cefazolin Day 3   Assessment High Grade MSSA Bacteremia - Unclear cause so far with no revealing TTE for vegetations. TEE is pending.  Unclear if patient was bacteremic on ED presentation as no blood cx available on admit.   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  Rest of the management as per the primary team. Thank you for the consult. Please page with pertinent questions or concerns.  Odette Fraction, MD Infectious Diseases  [facility]   To contact the attending provider between 8A-5P or the covering provider during after hours 5P-8A, please log into the web site [email or web address] and access using universal Tony password for that web site. If you do not have the password, please call the hospital operator. ______________________________________________________________________ Subjective patient seen and examined at the bedside. He seems to be sleeping, did not interact. RN said patient just worked with PT. He was agitated with staff last night.   Vitals BP (!) 152/77 (BP Location: Right Arm)   Pulse (!) 52   Temp 98.3 F (36.8 C) (Oral)   Resp 16   Ht 5\' 11"  (1.803 m)   Wt 65.3 kg   SpO2 100%   BMI 20.08 kg/m    General - sleeping, NAD, Left  sided facial droop HEENT- PERRLA Skin - Left anterior shoulder abrasion and bilateral anterior knee abrasion CVS - Normal s1s2, systolic murmur Abdomen- soft, NT, BS+ Extremities - Left sided weakness, Power -3/5 in the LUE and 4/5 in the LLE  LINES/TUBES: PIV  METAL IMPLANT/HARDWARE: Rt prosthetic shoulder and Strenotomy wires   Pertinent Microbiology Results for orders placed or performed during the hospital encounter of 02/28/20  Respiratory Panel by RT PCR (Flu A&B, Covid) - Nasopharyngeal Swab     Status: None   Collection Time: 02/28/20  1:21 PM   Specimen: Nasopharyngeal Swab  Result Value Ref Range Status   SARS Coronavirus 2 by RT PCR NEGATIVE NEGATIVE Final    Comment: (NOTE) SARS-CoV-2 target nucleic acids are NOT DETECTED.  The SARS-CoV-2 RNA is generally detectable in upper respiratoy specimens during the acute phase of infection. The lowest concentration of SARS-CoV-2 viral copies this assay can detect is 131 copies/mL. A negative result does not preclude SARS-Cov-2 infection and should not be used as the sole basis for treatment or other patient management decisions. A negative result may occur with  improper specimen collection/handling, submission of specimen other than nasopharyngeal swab, presence of viral mutation(s) within the areas targeted by this assay, and inadequate number of viral copies (<131 copies/mL). A negative result must be combined with clinical observations, patient history, and epidemiological information. The expected result is Negative.  Fact Sheet for Patients:  13/04/21  Fact Sheet for Healthcare Providers:  https://www.moore.com/  This test is no t yet approved or cleared by the https://www.young.biz/ and  has been authorized  for detection and/or diagnosis of SARS-CoV-2 by FDA under an Emergency Use Authorization (EUA). This EUA will remain  in effect (meaning this test can be used) for  the duration of the COVID-19 declaration under Section 564(b)(1) of the Act, 21 U.S.C. section 360bbb-3(b)(1), unless the authorization is terminated or revoked sooner.     Influenza A by PCR NEGATIVE NEGATIVE Final   Influenza B by PCR NEGATIVE NEGATIVE Final    Comment: (NOTE) The Xpert Xpress SARS-CoV-2/FLU/RSV assay is intended as an aid in  the diagnosis of influenza from Nasopharyngeal swab specimens and  should not be used as a sole basis for treatment. Nasal washings and  aspirates are unacceptable for Xpert Xpress SARS-CoV-2/FLU/RSV  testing.  Fact Sheet for Patients: https://www.moore.com/  Fact Sheet for Healthcare Providers: https://www.young.biz/  This test is not yet approved or cleared by the [place] FDA and  has been authorized for detection and/or diagnosis of SARS-CoV-2 by  FDA under an Emergency Use Authorization (EUA). This EUA will remain  in effect (meaning this test can be used) for the duration of the  Covid-19 declaration under Section 564(b)(1) of the Act, 21  U.S.C. section 360bbb-3(b)(1), unless the authorization is  terminated or revoked. Performed at [facility], [address] [place], [place] [address]   Culture, blood (routine x 2)     Status: Abnormal   Collection Time: 03/05/20  7:30 AM   Specimen: BLOOD  Result Value Ref Range Status   Specimen Description BLOOD RIGHT ANTECUBITAL  Final   Special Requests   Final    BOTTLES DRAWN AEROBIC AND ANAEROBIC Blood Culture adequate volume   Culture  Setup Time   Final    IN BOTH AEROBIC AND ANAEROBIC BOTTLES GRAM POSITIVE COCCI CRITICAL VALUE NOTED.  VALUE IS CONSISTENT WITH PREVIOUSLY REPORTED AND CALLED VALUE.    Culture (A)  Final    STAPHYLOCOCCUS AUREUS SUSCEPTIBILITIES PERFORMED ON PREVIOUS CULTURE WITHIN THE LAST 5 DAYS. Performed at [facility], [address] [place], [place] [address]    Report Status 03/07/2020 FINAL   Final  Culture, blood (routine x 2)     Status: Abnormal   Collection Time: 03/05/20  7:40 AM   Specimen: BLOOD LEFT WRIST  Result Value Ref Range Status   Specimen Description BLOOD LEFT WRIST  Final   Special Requests   Final    BOTTLES DRAWN AEROBIC ONLY Blood Culture results may not be optimal due to an inadequate volume of blood received in culture bottles   Culture  Setup Time   Final    AEROBIC BOTTLE ONLY GRAM POSITIVE COCCI Organism ID to follow CRITICAL RESULT CALLED TO, READ BACK BY AND VERIFIED WITHMerlene Morse [facility] 03/06/20 0054 JDW Performed at [facility], [address] [place], [place] [address]    Culture STAPHYLOCOCCUS AUREUS (A)  Final   Report Status 03/07/2020 FINAL  Final   Organism ID, Bacteria STAPHYLOCOCCUS AUREUS  Final      Susceptibility   Staphylococcus aureus - MIC*    CIPROFLOXACIN <=0.5 SENSITIVE Sensitive     ERYTHROMYCIN <=0.25 SENSITIVE Sensitive     GENTAMICIN <=0.5 SENSITIVE Sensitive     OXACILLIN 0.5 SENSITIVE Sensitive     TETRACYCLINE <=1 SENSITIVE Sensitive     VANCOMYCIN <=0.5 SENSITIVE Sensitive     TRIMETH/SULFA <=10 SENSITIVE Sensitive     CLINDAMYCIN <=0.25 SENSITIVE Sensitive     RIFAMPIN <=0.5 SENSITIVE Sensitive     Inducible Clindamycin  NEGATIVE Sensitive     * STAPHYLOCOCCUS AUREUS  Blood Culture ID Panel (Reflexed)     Status: Abnormal   Collection Time: 03/05/20  7:40 AM  Result Value Ref Range Status   Enterococcus faecalis NOT DETECTED NOT DETECTED Final   Enterococcus Faecium NOT DETECTED NOT DETECTED Final   Listeria monocytogenes NOT DETECTED NOT DETECTED Final   Staphylococcus species DETECTED (A) NOT DETECTED Final    Comment: CRITICAL RESULT CALLED TO, READ BACK BY AND VERIFIED WITH: V BRYK PHARMD 03/06/20 0054 JDW    Staphylococcus aureus (BCID) DETECTED (A) NOT DETECTED Final    Comment: CRITICAL RESULT CALLED TO, READ BACK BY AND VERIFIED WITH: V BRYK PHARMD 03/06/20 0054 JDW    Staphylococcus  epidermidis NOT DETECTED NOT DETECTED Final   Staphylococcus lugdunensis NOT DETECTED NOT DETECTED Final   Streptococcus species NOT DETECTED NOT DETECTED Final   Streptococcus agalactiae NOT DETECTED NOT DETECTED Final   Streptococcus pneumoniae NOT DETECTED NOT DETECTED Final   Streptococcus pyogenes NOT DETECTED NOT DETECTED Final   A.calcoaceticus-baumannii NOT DETECTED NOT DETECTED Final   Bacteroides fragilis NOT DETECTED NOT DETECTED Final   Enterobacterales NOT DETECTED NOT DETECTED Final   Enterobacter cloacae complex NOT DETECTED NOT DETECTED Final   Escherichia coli NOT DETECTED NOT DETECTED Final   Klebsiella aerogenes NOT DETECTED NOT DETECTED Final   Klebsiella oxytoca NOT DETECTED NOT DETECTED Final   Klebsiella pneumoniae NOT DETECTED NOT DETECTED Final   Proteus species NOT DETECTED NOT DETECTED Final   Salmonella species NOT DETECTED NOT DETECTED Final   Serratia marcescens NOT DETECTED NOT DETECTED Final   Haemophilus influenzae NOT DETECTED NOT DETECTED Final   Neisseria meningitidis NOT DETECTED NOT DETECTED Final   Pseudomonas aeruginosa NOT DETECTED NOT DETECTED Final   Stenotrophomonas maltophilia NOT DETECTED NOT DETECTED Final   Candida albicans NOT DETECTED NOT DETECTED Final   Candida auris NOT DETECTED NOT DETECTED Final   Candida glabrata NOT DETECTED NOT DETECTED Final   Candida krusei NOT DETECTED NOT DETECTED Final   Candida parapsilosis NOT DETECTED NOT DETECTED Final   Candida tropicalis NOT DETECTED NOT DETECTED Final   Cryptococcus neoformans/gattii NOT DETECTED NOT DETECTED Final   Meth resistant mecA/C and MREJ NOT DETECTED NOT DETECTED Final    Comment: Performed at [facility], [address], [place], [place] [address]    Pertinent Lab. CBC Latest Ref Rng & Units 03/06/2020 03/05/2020 03/05/2020  WBC 4.0 - 10.5 K/uL 9.8 8.9 8.9  Hemoglobin 13.0 - 17.0 g/dL 11.4(L) 11.2(L) 11.9(L)  Hematocrit 39 - 52 % 34.5(L) 34.6(L) 35.7(L)   Platelets 150 - 400 K/uL 233 201 216   CMP Latest Ref Rng & Units 03/06/2020 03/05/2020 03/05/2020  Glucose 70 - 99 mg/dL 308(M) 578(I) 77  BUN 8 - 23 mg/dL 19 20 20   Creatinine 0.61 - 1.24 mg/dL ) 6.96(E) 9.52(W)  Sodium 135 - 145 mmol/L 138 142 142  Potassium 3.5 - 5.1 mmol/L 4.0 3.8 3.8  Chloride 98 - 111 mmol/L 105 108 109  CO2 22 - 32 mmol/L 22 23 21(L)  Calcium 8.9 - 10.3 mg/dL 4.13(K) 4.4(W) 1.0(U)  Total Protein 6.5 - 8.1 g/dL 5.3(L) 5.8(L) -  Total Bilirubin 0.3 - 1.2 mg/dL 0.5 0.7 -  Alkaline Phos 38 - 126 U/L 69 79 -  AST 15 - 41 U/L 36 41 -  ALT 0 - 44 U/L 32 31 -     Pertinent Imaging today Plain films and CT images have been  personally visualized and interpreted; radiology reports have been reviewed. Decision making incorporated into the Impression / Recommendations.

## 2020-03-07 NOTE — Plan of Care (Signed)
  Problem: Consults Goal: RH STROKE PATIENT EDUCATION Description: See Patient Education module for education specifics  Outcome: Progressing   Problem: RH BOWEL ELIMINATION Goal: RH STG MANAGE BOWEL WITH ASSISTANCE Description: STG Manage Bowel with Assistance. Mod Outcome: Progressing   Problem: RH BLADDER ELIMINATION Goal: RH STG MANAGE BLADDER WITH ASSISTANCE Description: STG Manage Bladder With Mod Assistance Outcome: Progressing   Problem: RH SKIN INTEGRITY Goal: RH STG SKIN FREE OF INFECTION/BREAKDOWN Description: Remain free of further breakdown with mod assist Outcome: Progressing Goal: RH STG ABLE TO PERFORM INCISION/WOUND CARE W/ASSISTANCE Description: STG Able To Perform Incision/Wound Care With Assistance. Mod assist Outcome: Progressing   Problem: RH SAFETY Goal: RH STG ADHERE TO SAFETY PRECAUTIONS W/ASSISTANCE/DEVICE Description: STG Adhere to Safety Precautions With Assistance/Device. Mod assist Outcome: Progressing   Problem: RH COGNITION-NURSING Goal: RH STG USES MEMORY AIDS/STRATEGIES W/ASSIST TO PROBLEM SOLVE Description: STG Uses Memory Aids/Strategies With Assistance to Problem Solve. Mod assist Outcome: Progressing   Problem: RH PAIN MANAGEMENT Goal: RH STG PAIN MANAGED AT OR BELOW PT'S PAIN GOAL Description: Less than 4 Outcome: Progressing   Problem: RH KNOWLEDGE DEFICIT Goal: RH STG INCREASE KNOWLEDGE OF DIABETES Description: Patient/Caregiver will be able to verbalize management of diabetes including medications, diet with cues/handouts Outcome: Progressing Goal: RH STG INCREASE KNOWLEDGE OF HYPERTENSION Description: Patient/caregiver will be able to verbalize management of hypertension including monitoring blood pressure, medication and diet with cues/handouts Outcome: Progressing Goal: RH STG INCREASE KNOWLEDGE OF DYSPHAGIA/FLUID INTAKE Description: Patient/caregiver will be able to verbalize safe swallowing strategies with  cues/handouts Outcome: Progressing

## 2020-03-07 NOTE — Progress Notes (Signed)
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  GOAL:   Patient will meet greater than or equal to 90% of their needs  MONITOR:   PO intake, Supplement acceptance, Diet advancement, Labs, Weight trends, Skin  REASON FOR ASSESSMENT:   Consult Diet education  ASSESSMENT:   79 year old male with PMH of T2DM, HTN, CAD s/p CABG in 2020, ICM, cardioembolic CVA, COPD who was admitted on 02/28/20 after being found on the floor with left hemiparesis, facial droop, and multiple abrasions. 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 Hospital course further complicated by post-stroke dysphagia. Pt started on a dysphagia 1 diet with nectar-thick liquids. Admitted to CIR on 11/10.   RD consulted for diet education related to elevated CBGs.  Pt currently on a dysphagia 1 diet with nectar-thick liquids. Food options are very limited on this diet. Pt meets criteria for malnutrition and is confused at time of RD visit. Diet education not appropriate at this time.  Spoke with pt at bedside. Pt perseverating on planned TEE. He states that his appetite is good and that he is eating well. Meal completions documented as 45-100%. Given restricted diet and malnutrition, RD to order oral nutrition supplements to aid pt in meeting kcal and protein needs, promote  healing, and preserve lean muscle mass. Pt amenable to receiving oral nutrition supplements with meal trays.  Pt unable to provide much diet or weight history at this time. He does endorse weight loss and reports a UBW of 180 lbs. He is unsure when he last weighed this. He believes that he now weighs about 150 lbs. Weight history in chart is limited. Last available weight prior to acute admission is from 2017 but does indicate that pt has lost weight (82.1 kg on 06/10/15 and 65.3 kg currently).  Medications reviewed and include: SSI, Juven BID, vitamin C, IV abx  Labs reviewed. CBG's: 146-246 x 24 hours  NUTRITION - FOCUSED PHYSICAL EXAM:    Most Recent Value  Orbital Region Severe depletion  Upper Arm Region Severe depletion  Thoracic and Lumbar Region Moderate depletion  Buccal Region Moderate depletion  Temple Region Moderate depletion  Clavicle Bone Region Severe depletion  Clavicle and Acromion Bone Region Severe depletion  Scapular Bone Region Moderate depletion  Dorsal Hand Moderate depletion  Patellar Region Moderate depletion  Anterior Thigh Region Severe depletion  Posterior Calf Region Moderate depletion  Edema (RD Assessment) None  Hair Reviewed  Eyes Reviewed  Mouth Reviewed  Skin Reviewed  Nails Reviewed       Diet Order:   Diet Order            DIET - DYS 1 Room service appropriate? Yes; Fluid consistency: Nectar Thick  Diet effective now                 EDUCATION NEEDS:   Not appropriate for education at this time  Skin:  Skin Assessment: Skin Integrity Issues: Incisions: face Other: non-pressure wounds to bilateral knees, non-pressure wound to left shoulder  Last BM:  03/07/20 type 7  Height:   Ht Readings from Last 1 Encounters:  03/05/20 5\' 11"  (1.803 m)    Weight:   Wt Readings from Last 1 Encounters:  03/07/20 65.3 kg    BMI:  Body mass index is 20.08 kg/m.  Estimated Nutritional Needs:   Kcal:  1800-2000  Protein:  85-100  grams  Fluid:  1.8-2.0 L    13/12/21, MS, RD, LDN Inpatient Clinical Dietitian Please see AMiON for contact information.

## 2020-03-07 NOTE — Progress Notes (Signed)
Physical Therapy Session Note  Patient Details  Name: [name] MRN: [id number] Date of Birth: [birth year]  Today's Date: 03/07/2020 PT Individual Time: 0800-0845 PT Individual Time Calculation (min): 45 min   Short Term Goals: Week 1:  PT Short Term Goal 1 (Week 1): Patient will be able to achieve supine > sit with CGA consistently PT Short Term Goal 2 (Week 1): Patient will transfer bed<> wc with MinA x1 at least 50% of the time PT Short Term Goal 3 (Week 1): Patient will ambulate >46ft with MinA x2 with LRAD PT Short Term Goal 4 (Week 1): Patient will complete sit <> stand with no more than ModA x1 and LRAD  Skilled Therapeutic Interventions/Progress Updates:    Patient received sitting up in bed, eating breakfast with NT present. Agreeable to PT. He reports "pain all over," but was unable to provide numerical rating. PT providing rest breaks to assist with pain management. PT providing supervision while patient continued his breakfast. Verbal cues to slow down intake and ensure successful swallow. Patient found to be holding food in his mouth for prolonged period of time prior to swallowing. Patient requiring encouragement to get dressed appropriately to participate in therapy (keeping brief on and putting pants on). MaxA provided to don pants supine in bed. ModA to come to sitting edge of bed. MaxA stand pivot to wc. PT propelling patient in wc to therapy gym for time management and energy conservation. He was able to transfer to therapy mat via squat pivot and ModA. Dynamic sitting balance completed reaching outside BOS with L UE on mat for improved proprioceptive input. He made multiple attempts to stand up from mat without being instructed to do so. He was able to maintain dynamic sitting balance with close supervision to CGA. No protective responses observed when patient did lose balance requiring CGA and verbal cues from PT to correct. He transferred back to chair with ModA via squat  pivot and back to bed, per NT request with MaxA stand pivot. Bed alarm on, call light within reach.   Therapy Documentation Precautions:  Precautions Precautions: Fall Precaution Comments: L hemi, L inattention Restrictions Weight Bearing Restrictions: No    Therapy/Group: Individual Therapy  Elizebeth Koller, PT, DPT, CBIS  03/07/2020, 7:33 AM

## 2020-03-07 NOTE — Progress Notes (Signed)
Occupational Therapy Session Note  Patient Details  Name: [name] MRN: [id number] Date of Birth: May 05, 1940  Today's Date: 03/07/2020 OT Individual Time: 1004-1059 OT Individual Time Calculation (min): 55 min    Short Term Goals: Week 1:  OT Short Term Goal 1 (Week 1): Pt will complete UB dressing with min assist for pullover shirt. OT Short Term Goal 2 (Week 1): Pt will complete LB bathing with mod assist sit to stand for two consecutive sessions. OT Short Term Goal 3 (Week 1): Pt will complete LB dressing with mod assist sit to stand. OT Short Term Goal 4 (Week 1): Pt will use the LUE with min assist for washing the RUE.  Skilled Therapeutic Interventions/Progress Updates:    Pt in bed to start session.  He was oriented to place as well as situation, but could not state any deficits from his CVA.  He was able to transfer to the EOB with mod assist in order to move to the wheelchair for bathing at the sink.  He continued to perseverated on putting on his shoes, with therapist telling him multiple times that he was going to wash up first and then get dressed.  Shoes had to finally be place out of his view.  He worked on bathing at the sink sit to stand.  Decreased thoroughness noted with max instructional cueing needed to sequence task.  He needed mod assist for washing his lower legs as well as for standing to wash peri area.  Max assist was needed for donning new brief with mod assist for pants.  Total assist for TEDs and gripper socks secondary to going down for x-ray.  He needed max assist for donning pullover shirt secondary to left hemiparesis and IV in the right arm.  He finished session with transfer back to the bed with mod assist.  Throughout session, pt with some unintelligible speech talking about his heart attack, his brother's divorce, and other topics that were difficult to understand.  Decreased sustained attention to task throughout.    Therapy Documentation Precautions:   Precautions Precautions: Fall Precaution Comments: L hemi, L inattention Restrictions Weight Bearing Restrictions: No  Pain: Pain Assessment Pain Scale: Faces Pain Score: 0-No pain ADL: See Care Tool Section for some details of mobility and selfcare  Therapy/Group: Individual Therapy  MCGUIRE,[place] OTR/L 03/07/2020, 12:23 PM

## 2020-03-07 NOTE — Progress Notes (Signed)
Patient ID: [name], male   DOB: 10/23/1940, 79 y.o.   MRN: [id number]   SW was able to follow up with patient daughter. Daughter active in patient's care. Is currently living in [place]. Daughter plans to take her father back to [place] to live with her (2 Level Home, 2 Steps to enter). Will be able to provide 24/7, works from home but will have occasional days that she has to go into work. Daughter will be driving to pick patient up for discharge and prefers a weekend (Friday or Saturday) when thinking about setting discharge dates. Daughter have some concerns on patient having dementia, will follow up with physician. Daughter will be primary care giver, sw will continue to follow up. No additional questions or concerns.  [place], [place] [phone]

## 2020-03-07 NOTE — Progress Notes (Signed)
Physical Therapy Session Note  Patient Details  Name: [name] MRN: [id number] Date of Birth: 01-10-1941  Today's Date: 03/07/2020 PT Individual Time: 1840-3754 PT Individual Time Calculation (min): 25 min   Short Term Goals: Week 1:  PT Short Term Goal 1 (Week 1): Patient will be able to achieve supine > sit with CGA consistently PT Short Term Goal 2 (Week 1): Patient will transfer bed<> wc with MinA x1 at least 50% of the time PT Short Term Goal 3 (Week 1): Patient will ambulate >48f with MinA x2 with LRAD PT Short Term Goal 4 (Week 1): Patient will complete sit <> stand with no more than ModA x1 and LRAD  Skilled Therapeutic Interventions/Progress Updates:    Pt received supine in bed and agreeable to PT. Pt does not report any pain at this time. Pt instructed in bed mobility (rolling L with CGA/min assist and R with mod assist due to L extensor response). Pt R sidelying > sit on EOB with mod assist due to noted L extensor response and assistance with L UE/LE.   Static sitting balance with CGA/min assist to maintain erect posture and facilitate anterior trunk lean to combat extensor tone. Pt able to sit EOB for ~6 minutes at varying bed heights for 2 min each (thighs parallel, hips slightly above knees, and roughly 45 deg angle from hips to knees). Pt requiring less assistance as bed height is raised each time.   Three Musketeer x 2 for ~360fforward/backward. Pt requiring mod assist x 2 for L UE/LE management, L foot placement, verbal/tactile cueing, and L limb advancement.   Pt returned to bed and assisted into the supine position with mod assist for LE management and trunk repositioning. Pt left with call bell in reach, bed alarm on, and needs met.   Therapy Documentation Precautions:  Precautions Precautions: Fall Precaution Comments: L hemi, L inattention Restrictions Weight Bearing Restrictions: No General:   Vital Signs: Therapy Vitals Temp: 98.2 F (36.8 C) Pulse  Rate: (!) 57 Resp: 19 BP: 122/67 Patient Position (if appropriate): Lying Oxygen Therapy O2 Device: Room Air Pain: Pain Assessment Pain Scale: Faces Faces Pain Scale: No hurt   Therapy/Group: Individual Therapy  BaGaylord Shih1/03/2020, 4:46 PM

## 2020-03-07 NOTE — Progress Notes (Signed)
    [facility] has been requested to perform a transesophageal echocardiogram on [name] for bacteremia.  After careful review of history and examination, the risks and benefits of transesophageal echocardiogram have been explained including risks of esophageal damage, perforation (1:10,000 risk), bleeding, pharyngeal hematoma as well as other potential complications associated with conscious sedation including aspiration, arrhythmia, respiratory failure and death. Alternatives to treatment were discussed, questions were answered. Patient is A&O x 2, nurse reported worsening confusion today so unable to consetn. I spoke to his daughter, Irean Hong, on the phone who consented to the procedure. All questions were answered. They are willing to proceed.   Merryl Buckels David Stall, PA-C  03/07/2020 2:03 PM

## 2020-03-07 NOTE — Progress Notes (Addendum)
[name] PHYSICAL MEDICINE & REHABILITATION PROGRESS NOTE   Subjective/Complaints: Much more alert today! Did very well with PT this morning Complaining of neck pain that is new for him- cervical spine XR ordered, pain is midline and +TTP  ROS: + neck pain  Objective:   No results found. Recent Labs    03/05/20 0730 03/06/20 1241  WBC 8.9 9.8  HGB 11.2* 11.4*  HCT 34.6* 34.5*  PLT 201 233   Recent Labs    03/05/20 0730 03/06/20 0657  NA 142 138  K 3.8 4.0  CL 108 105  CO2 23 22  GLUCOSE 103* 135*  BUN 20 19  CREATININE 1.53* 1.32*  CALCIUM 8.8* 8.4*    Intake/Output Summary (Last 24 hours) at 03/07/2020 1105 Last data filed at 03/07/2020 0800 Gross per 24 hour  Intake 460 ml  Output --  Net 460 ml        Physical Exam: Vital Signs Blood pressure (!) 152/77, pulse (!) 52, temperature 98.3 F (36.8 C), temperature source Oral, resp. rate 16, height 5\' 11"  (1.803 m), weight 65.3 kg, SpO2 100 %.   General: Much more alert today, No apparent distress HEENT: Head is normocephalic, atraumatic, PERRLA, EOMI, sclera anicteric, oral mucosa pink and moist, dentition intact, ext ear canals clear,  Neck: Supple without JVD or lymphadenopathy Heart: Bradycardic. No murmurs rubs or gallops Chest: CTA bilaterally without wheezes, rales, or rhonchi; no distress Abdomen: Soft, non-tender, non-distended, bowel sounds positive.  Musculoskeletal:     Cervical back: Normal range of motion and neck supple.     Comments: Pain with attempts at knee extension/flexion.  Left hands with edematous discolored dorsal digits.  +TTP over cervical spine  Skin:    Comments: Bilateral knees with foam dressings covering ruptured blisters.   Neurological:     Mental Status: He is oriented to person, place, and time. He is lethargic.     Comments: Somnolent but aroused briefly to sternal rubs. He was able to state name as " " and answer orientation questions with choice of two.  Did not open eyes or attempt to interact Motor: Limited due to participation  Psychiatric:     Comments: Unable to assess due to somnolence     Assessment/Plan: 1. Functional deficits which require 3+ hours per day of interdisciplinary therapy in a comprehensive inpatient rehab setting.  Physiatrist is providing close team supervision and 24 hour management of active medical problems listed below.  Physiatrist and rehab team continue to assess barriers to discharge/monitor patient progress toward functional and medical goals  Care Tool:  Bathing    Body parts bathed by patient: Chest, Abdomen, Left arm, Right upper leg, Left upper leg, Face   Body parts bathed by helper: Left lower leg, Right lower leg, Right arm, Front perineal area, Buttocks     Bathing assist Assist Level: Maximal Assistance - Patient 24 - 49%     Upper Body Dressing/Undressing Upper body dressing   What is the patient wearing?: Pull over shirt    Upper body assist Assist Level: Maximal Assistance - Patient 25 - 49%    Lower Body Dressing/Undressing Lower body dressing      What is the patient wearing?: Pants, Incontinence brief     Lower body assist Assist for lower body dressing: Total Assistance - Patient < 25%     Toileting Toileting    Toileting assist Assist for toileting: 2 Helpers     Transfers Chair/bed transfer  Transfers assist  Chair/bed transfer assist level: Maximal Assistance - Patient 25 - 49%     Locomotion Ambulation   Ambulation assist      Assist level: 2 helpers Assistive device: Hand held assist Max distance: 25   Walk 10 feet activity   Assist     Assist level: 2 helpers Assistive device: Hand held assist   Walk 50 feet activity   Assist Walk 50 feet with 2 turns activity did not occur: Safety/medical concerns (due to patient fatigue/weakness)         Walk 150 feet activity   Assist Walk 150 feet activity did not occur:  Safety/medical concerns (due to patient fatigue/weakness)         Walk 10 feet on uneven surface  activity   Assist Walk 10 feet on uneven surfaces activity did not occur: Safety/medical concerns (due to patient fatigue/weakness)         Wheelchair     Assist Will patient use wheelchair at discharge?: Yes Type of Wheelchair: Manual    Wheelchair assist level: Dependent - Patient 0% (TIS wc) Max wheelchair distance: 150    Wheelchair 50 feet with 2 turns activity    Assist        Assist Level: Dependent - Patient 0%   Wheelchair 150 feet activity     Assist      Assist Level: Dependent - Patient 0%   Blood pressure (!) 152/77, pulse (!) 52, temperature 98.3 F (36.8 C), temperature source Oral, resp. rate 16, height 5\' 11"  (1.803 m), weight 65.3 kg, SpO2 100 %.    Medical Problem List and Plan: 1. 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               -patient may shower             -ELOS/Goals: 22-27 days/min a             Continue CIR, did great with PT this morning  2.  Antithrombotics: -DVT/anticoagulation:  Pharmaceutical: Other (comment)--Eliquis.              -antiplatelet therapy: N/A--has been off ASA/Brilinta.  3. Chronic HA/Cervicalgia/Pain Management: Was on flexeril prn PTA. Will monitor N/A. Will order kpad and cervical spine XR given new neck pain and TTP on cervical spine. 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             Monitor with increased exertion 4. Mood: LCSW to follow for evaluation and support.              -antipsychotic agents: Monitor for now.  5. Neuropsych: This patient is not capable of making decisions on his own behalf. 6. Skin/Wound Care: Routine  pressure relief measures. Protein supplement due to evidence of malnutrition.  7. Fluids/Electrolytes/Nutrition: Monitor I/Os.              CMP 11/11 is stable.  8. Gastroenteritis: Had multiple episodes of N/V on 11/09 with 4 incontinent stools and low grade fever. Monitor for GI virus. 9. HTN: Monitor BP tid--educate on importance of compliance. SBP goal 130-150             Monitor with increased exertion. 10. ICM/Heart failure with persistent apical thrombus: Monitor for signs and symptoms of fluid overload. Daily weights. Continue Eliquis.  11.  A fib: Monitor HR tid--continue Lanoxin and Eliquis.              Monitor with increased activity 12. Acute on chronic renal failure: Likely due to gastroenteritis. SCr 1.55-->1.3-->1.53.   Will recheck lytes in am.              Cr trending down on 11/11 13. Electrolytes abnormalities: Resolved post supplementation--recheck magnesium/potassium levels in am.  14. Anemia: Recheck iron levels.                    CBC ordered for tomorrow a.m. 15. T2DM: Hgb A1C-6.8.  Will monitor BS ac/hs. Discontinue metformin due to CKD. Add CM restrictions to diet.              Monitor with increased mobility  11/12: CBG elevated to 246 last night. Ordered consult to RD for diet education 16. Aspiration Pneumonitis: Started on Augmentin 11/10 due to concerns of infection.  17. Dysphagia: Continue CIR SLP   LOS: 2 days A FACE TO FACE EVALUATION WAS PERFORMED  [name] Terisha Losasso 03/07/2020, 11:05 AM

## 2020-03-08 ENCOUNTER — Inpatient Hospital Stay (HOSPITAL_COMMUNITY): Payer: Medicare PPO

## 2020-03-08 ENCOUNTER — Inpatient Hospital Stay (HOSPITAL_COMMUNITY): Payer: Medicare PPO | Admitting: Occupational Therapy

## 2020-03-08 ENCOUNTER — Inpatient Hospital Stay (HOSPITAL_COMMUNITY): Payer: Medicare PPO | Admitting: Physical Therapy

## 2020-03-08 DIAGNOSIS — E1149 Type 2 diabetes mellitus with other diabetic neurological complication: Secondary | ICD-10-CM

## 2020-03-08 DIAGNOSIS — B9561 Methicillin susceptible Staphylococcus aureus infection as the cause of diseases classified elsewhere: Secondary | ICD-10-CM

## 2020-03-08 DIAGNOSIS — R7881 Bacteremia: Secondary | ICD-10-CM

## 2020-03-08 LAB — GLUCOSE, CAPILLARY
Glucose-Capillary: 91 mg/dL (ref 70–99)
Glucose-Capillary: 163 mg/dL — ABNORMAL HIGH (ref 70–99)
Glucose-Capillary: 206 mg/dL — ABNORMAL HIGH (ref 70–99)
Glucose-Capillary: 108 mg/dL — ABNORMAL HIGH (ref 70–99)

## 2020-03-08 NOTE — Progress Notes (Signed)
Physical Therapy Session Note  Patient Details  Name: [name] MRN: [id number] Date of Birth: 1941-03-09  Today's Date: 03/08/2020 PT Individual Time: 1310-1405 PT Individual Time Calculation (min): 55 min   Short Term Goals: Week 1:  PT Short Term Goal 1 (Week 1): Patient will be able to achieve supine > sit with CGA consistently PT Short Term Goal 2 (Week 1): Patient will transfer bed<> wc with MinA x1 at least 50% of the time PT Short Term Goal 3 (Week 1): Patient will ambulate >37ft with MinA x2 with LRAD PT Short Term Goal 4 (Week 1): Patient will complete sit <> stand with no more than ModA x1 and LRAD  Skilled Therapeutic Interventions/Progress Updates:    Pt received sitting in TIS w/c awake, watching TV and agreeable to therapy session. Upon questioning reports need to use bathroom. Transported in/out of bathroom in TIS w/c. Stand pivot w/c<>BSC over toilet using grab bar support with heavy mod assist of 1 for balance and facilitating L LE stepping, +2 assist performing LB clothing management for safety. Attempted to void but pt unable despite increased time - while sitting on toilet pt perseverating on pulling out and collecting toilet paper, cuing to discontinue behavior. Transported to/from gym in w/c for time management and energy conservation. Gait training ~16ft x 2 using +2 mod assist via 3 Musketeer support - started with step-to pattern leading with L LE keeping L LE flexed during stance (but not buckling) - cuing for progression to reciprocal stepping pattern with pt noted to have difficulty weight shifting R during stance to allow adequate L LE swing phase advancement requiring min assist for increased step length - noted to have sustained L hip/knee flexion in slight squat position during stance with facilitation for increased extension. Pt reports some fatigue after ambulation but with encouragement agreeable to continue participating. Stand pivots w/c<>EOM with mod assist  of 1 for balance and facilitation for R weight shift to allow L LE stepping. Dynamic standing balance task via reaching outside BOS to grasp horseshoe then toss to external target - min assist primarily with occasional mod assist for balance recovery - pt demos ability to step R LE forward/backwards while reaching to targets with therapist facilitating increased L LE extension. Gait training ~59ft with +2 mod assist via 3 Musketeer support as described above. Transported back to room and pt requesting to return to bed and rest. L squat pivot w/c>EOB with mod assist for lifting/pivoting hips. Sit>supine with min assist for LE management into bed. Pt quickly falling asleep - left supine in bed with needs in reach, HOB elevated, and bed alarm on.   Therapy Documentation Precautions:  Precautions Precautions: Fall Precaution Comments: L hemi, L inattention Restrictions Weight Bearing Restrictions: No  Pain: Reports neck pain while sitting on EOM with back unsupported - RN notified and present for medication administration - provided repositioning for pain management.   Therapy/Group: Individual Therapy  Ginny Forth , PT, DPT, CSRS  03/08/2020, 12:19 PM

## 2020-03-08 NOTE — Progress Notes (Signed)
Occupational Therapy Session Note  Patient Details  Name: [name] MRN: [id number] Date of Birth: [birth year]  Today's Date: 03/08/2020 OT Individual Time: 1000-1100 OT Individual Time Calculation (min): 60 min    Short Term Goals: Week 1:  OT Short Term Goal 1 (Week 1): Pt will complete UB dressing with min assist for pullover shirt. OT Short Term Goal 2 (Week 1): Pt will complete LB bathing with mod assist sit to stand for two consecutive sessions. OT Short Term Goal 3 (Week 1): Pt will complete LB dressing with mod assist sit to stand. OT Short Term Goal 4 (Week 1): Pt will use the LUE with min assist for washing the RUE.  Skilled Therapeutic Interventions/Progress Updates:    First session: Pt asleep in supine, needing max VCs and TCs to increase alertness.  Once pt awakened, agreeable to OT session.  Pt noted with soiled brief of urine therefore bed level toileting completed with total assist.  Pt able to roll to left with mod assist and needed total assist to roll to right.  Pt completed supine to sit with max assist and max simple step VCs for body mechanics.  Pt doffed hospital gown with mod assist.  UB bathing completed needing mod assist for back and RUE using hand over hand technique to facilitate NMR of LUE.  Pt educated on hemitechnique to donn shirt overhead requiring mod assist for follow through.  Pt completed squat pivot to TIS w/c with max assist (and CGA from rehab tech).  Pt donned pants with max assist needing step by step VCs to increase pt participation and sequencing.  Pt was able to stand at sink with min assist for balance while OT pulled pants over hips.  Pt brushed teeth in standing with min assist for balance and assist to apply toothpaste to toothbrush.  Pt applied lotion to BUE and BLE with hand over hand when using LUE.  Pt transported to Ortho gym and participated in seated visual scanning and dynamic sitting balance task at [employer].  Pt attempting to use  LUE intermittently without being cued however only able to achieve approx 45 degrees scaption.  Reaction time average in right left lower quadrant 2 seconds.  Pt transported back to room, call bell in reach, seat belt alarm on, Telesitter on.    Second session: Pt asleep in supine, OT provided max VCs and TCs including washing face with washcloth, however pt very lethargic and reporting "leave it, leave it" then falling back asleep.  OT unable to facilitate increased alertness for pt participation for OT session therefore deferred remainder of session.  Call bell in reach, bed alarm on, Telesitter on.  Pt missed x 30 minutes treatment session.  Therapy Documentation Precautions:  Precautions Precautions: Fall Precaution Comments: L hemi, L inattention Restrictions Weight Bearing Restrictions: No   Therapy/Group: Individual Therapy  Amie Critchley 03/08/2020, 4:00 PM

## 2020-03-08 NOTE — Progress Notes (Signed)
Speech Language Pathology Daily Session Note  Patient Details  Name: [name] MRN: [id number] Date of Birth: 1941-01-02  Today's Date: 03/08/2020 SLP Individual Time: 2440-1027 SLP Individual Time Calculation (min): 35 min  Short Term Goals: Week 1: SLP Short Term Goal 1 (Week 1): Pt will consume current diet with efficient mastication and oral clearance and minimal overt s/sx aspiration with Mod A cueing to reduce oral holding and clear left pocketing. SLP Short Term Goal 2 (Week 1): Pt will consume therapeutic trials of thin H2O with minimal overt s/sx aspiration across 3 sessions to demonstrate readiness for repeat MBSS. SLP Short Term Goal 3 (Week 1): Pt will sustain attention to tasks and functional topics of conversation for 5-7 minute intervals with Min A cues for redirection. SLP Short Term Goal 4 (Week 1): Pt will demonstrate ability to problem solve functional familiar situations with Mod A verbal/visual cues. SLP Short Term Goal 5 (Week 1): Pt will recall new and/or daily information related to therapies with Mod A cues for use of aids or strategies. SLP Short Term Goal 6 (Week 1): Pt will utilize compensatory strategies for speech intelligibility (increased vocal intensity and overarticulation) at the sentence level with Mod A cues.  Skilled Therapeutic Interventions: Skilled SLP intervention focused on dysphagia. Pt very lethargic this session with eyes closed 70% of session but followed SLPs simple instructions during breakfast with Min A verbal cues for repetition. Pt fed by SLP with nectar thick supplement drink, dys 1 breakfast tray and trials with thin liquids. Mod A verbal cues needed for pt to clear all food in oral cavity. Improvement in oral clearance with sips of NTL after 2-3 bites. Trials completed at end of session with thin liquid via cup sip. Pt tolerated 2 oz with no overt s/sx of aspiration or penetration. Pt missed 10 min of tx due to falling asleep. SLP unable to  wake pt up enoughfor safe po trials. Cont with plan of care.      Pain Pain Assessment Pain Scale: Faces Faces Pain Scale: No hurt  Therapy/Group: Individual Therapy  Carlean Jews Rainer Mounce 03/08/2020, 9:27 AM

## 2020-03-08 NOTE — Progress Notes (Signed)
[place] PHYSICAL MEDICINE & REHABILITATION PROGRESS NOTE   Subjective/Complaints:   Pt sleepy this AM- laying curled up in bed- head turned to side- looks uncomfortable position.   Woke up briefly- waiting for NT to feed him    ROS: too sedated/sleepy to answer questions  Objective:   DG Cervical Spine 2-3Vclearing  Result Date: 03/07/2020 CLINICAL DATA:  Neck pain. EXAM: LIMITED CERVICAL SPINE FOR TRAUMA CLEARING - 2-3 VIEW COMPARISON:  None. FINDINGS: There is no evidence of cervical spine fracture or prevertebral soft tissue swelling. Loss of the normal cervical lordosis with mild reversal. No other significant bone abnormalities are identified. IMPRESSION: No acute osseous injury of the cervical spine. 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 Electronically Signed   By: Elige Ko   On: 03/07/2020 11:48   Recent Labs    03/06/20 1241  WBC 9.8  HGB 11.4*  HCT 34.5*  PLT 233   Recent Labs    03/06/20 0657  NA 138  K 4.0  CL 105  CO2 22  GLUCOSE 135*  BUN 19  CREATININE 1.32*  CALCIUM 8.4*    Intake/Output Summary (Last 24 hours) at 03/08/2020 1337 Last data filed at 03/08/2020 0701 Gross per 24 hour  Intake 720 ml  Output --  Net 720 ml        Physical Exam: Vital Signs Blood pressure 130/70, pulse (!) 47, temperature 98.3 F (36.8 C), resp. rate 16, height 5\' 11"  (1.803 m), weight 64.1 kg, SpO2 99 %.   General: sleepy- woke up briefly- but very sleepy- NAD HEENT: head turned to side- appears uncomfortable Heart: bradycardic- irregular Chest: CTA B/L- no W/R/R- good air movement- had a juicy cough, but when listened, CTA B/L Abdomen: Soft, NT, ND, (+)BS   Musculoskeletal:     Cervical back: Normal range of motion and neck supple.     Comments: Pain with attempts at knee extension/flexion.  Left hands with edematous discolored dorsal digits.  +TTP over cervical spine  Skin:    Comments: Bilateral knees with  foam dressings covering ruptured blisters.   Neurological:     Mental Status: He is oriented to person, place, and time. He is lethargic.     Comments: Somnolent but aroused briefly to sternal rubs. He was able to state name as " " and answer orientation questions with choice of two. Did not open eyes or attempt to interact Motor: Limited due to participation  Psychiatric:     Comments: sleepy    Assessment/Plan: 1. Functional deficits which require 3+ hours per day of interdisciplinary therapy in a comprehensive inpatient rehab setting.  Physiatrist is providing close team supervision and 24 hour management of active medical problems listed below.  Physiatrist and rehab team continue to assess barriers to discharge/monitor patient progress toward functional and medical goals  Care Tool:  Bathing    Body parts bathed by patient: Chest, Abdomen, Right upper leg, Left upper leg, Face, Right arm, Front perineal area   Body parts bathed by helper: Buttocks, Left lower leg, Right lower leg     Bathing assist Assist Level: Moderate Assistance - Patient 50 - 74%     Upper Body Dressing/Undressing Upper body dressing   What is the patient wearing?: Hospital gown only    Upper body assist Assist Level: Maximal Assistance - Patient 25 - 49%    Lower Body Dressing/Undressing Lower body dressing      What is the patient wearing?: Pants, Incontinence brief  Lower body assist Assist for lower body dressing: Maximal Assistance - Patient 25 - 49%     Toileting Toileting    Toileting assist Assist for toileting: 2 Helpers     Transfers Chair/bed transfer  Transfers assist     Chair/bed transfer assist level: Moderate Assistance - Patient 50 - 74%     Locomotion Ambulation   Ambulation assist      Assist level: 2 helpers Assistive device: Hand held assist Max distance: 25   Walk 10 feet activity   Assist     Assist level: 2 helpers Assistive  device: Hand held assist   Walk 50 feet activity   Assist Walk 50 feet with 2 turns activity did not occur: Safety/medical concerns (due to patient fatigue/weakness)         Walk 150 feet activity   Assist Walk 150 feet activity did not occur: Safety/medical concerns (due to patient fatigue/weakness)         Walk 10 feet on uneven surface  activity   Assist Walk 10 feet on uneven surfaces activity did not occur: Safety/medical concerns (due to patient fatigue/weakness)         Wheelchair     Assist Will patient use wheelchair at discharge?: Yes Type of Wheelchair: Manual    Wheelchair assist level: Dependent - Patient 0% (TIS wc) Max wheelchair distance: 150    Wheelchair 50 feet with 2 turns activity    Assist        Assist Level: Dependent - Patient 0%   Wheelchair 150 feet activity     Assist      Assist Level: Dependent - Patient 0%   Blood pressure 130/70, pulse (!) 47, temperature 98.3 F (36.8 C), resp. rate 16, height 5\' 11"  (1.803 m), weight 64.1 kg, SpO2 99 %.    Medical Problem List and Plan: 1. 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               -patient may shower             -ELOS/Goals: 22-27 days/min a             Continue CIR, did great with PT this morning  2.  Antithrombotics: -DVT/anticoagulation:  Pharmaceutical: Other (comment)--Eliquis.              -antiplatelet therapy: N/A--has been off ASA/Brilinta.  3. Chronic HA/Cervicalgia/Pain Management: Was on flexeril prn PTA. Will monitor N/A. Will order kpad and cervical spine XR given new neck pain and TTP on cervical spine. 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             Monitor with increased  exertion 4. Mood: LCSW to follow for evaluation and support.              -antipsychotic agents: Monitor for now.  5. Neuropsych: This patient is not capable of making decisions on his own behalf. 6. Skin/Wound Care: Routine pressure relief measures. Protein supplement due to evidence of malnutrition.  7. Fluids/Electrolytes/Nutrition: Monitor I/Os.              CMP 11/11 is stable.  8. Gastroenteritis: Had multiple episodes of N/V on 11/09 with 4 incontinent stools and low grade fever. Monitor for GI virus. 9. HTN: Monitor BP tid--educate on importance of  compliance. SBP goal 130-150  11/13- BP controlled- con't regimen              Monitor with increased exertion. 10. ICM/Heart failure with persistent apical thrombus: Monitor for signs and symptoms of fluid overload. Daily weights. Continue Eliquis.  11. A fib: Monitor HR tid--continue Lanoxin and Eliquis.              Monitor with increased activity 12. Acute on chronic renal failure: Likely due to gastroenteritis. SCr 1.55-->1.3-->1.53.   Will recheck lytes in am.              Cr trending down on 11/11  11/13- Cr down to 1.32- con't to monitor  13. Electrolytes abnormalities: Resolved post supplementation--recheck magnesium/potassium levels in am.  14. Anemia: Recheck iron levels.                    CBC ordered for tomorrow a.m. 15. T2DM: Hgb A1C-6.8.  Will monitor BS ac/hs. Discontinue metformin due to CKD. Add CM restrictions to diet.              Monitor with increased mobility  11/12: CBG elevated to 246 last night. Ordered consult to RD for diet education  11/13- BG 88-271- very labile- primary physician might need to start insulin/d/w pt.  16. Aspiration Pneumonitis: Started on Augmentin 11/10 due to concerns of infection.  17. Dysphagia: Continue CIR SLP 18. MSSA bacteremia  11/13- needs TEE this upcoming week, per ID note. Monitoring/following blood Cx's.   LOS: 3 days A FACE TO FACE EVALUATION WAS PERFORMED  [name] 03/08/2020, 1:37 PM

## 2020-03-08 NOTE — Discharge Instructions (Addendum)
.  dcInformation on my medicine - ELIQUIS (apixaban)  This medication education was reviewed with me or my healthcare representative as part of my discharge preparation .  Why was Eliquis prescribed for you? Eliquis was prescribed to treat blood clots that may have been found in the veins of your legs (deep vein thrombosis) or in your lungs (pulmonary embolism) and to reduce the risk of them occurring again.  What do You need to know about Eliquis ? The dose is ONE 5 mg tablet taken TWICE daily.  Eliquis may be taken with or without food.   Try to take the dose about the same time in the morning and in the evening. If you have difficulty swallowing the tablet whole please discuss with your pharmacist how to take the medication safely.  Take Eliquis exactly as prescribed and DO NOT stop taking Eliquis without talking to the doctor who prescribed the medication.  Stopping may increase your risk of developing a new blood clot.  Refill your prescription before you run out.  After discharge, you should have regular check-up appointments with your healthcare provider that is prescribing your Eliquis.    What do you do if you miss a dose? If a dose of ELIQUIS is not taken at the scheduled time, take it as soon as possible on the same day and twice-daily administration should be resumed. The dose should not be doubled to make up for a missed dose.  Important Safety Information A possible side effect of Eliquis is bleeding. You should call your healthcare provider right away if you experience any of the following: ? Bleeding from an injury or your nose that does not stop. ? Unusual colored urine (red or dark brown) or unusual colored stools (red or black). ? Unusual bruising for unknown reasons. ? A serious fall or if you hit your head (even if there is no bleeding).  Some medicines may interact with Eliquis and might increase your risk of bleeding or clotting while on Eliquis. To help  avoid this, consult your healthcare provider or pharmacist prior to using any new prescription or non-prescription medications, including herbals, vitamins, non-steroidal anti-inflammatory drugs (NSAIDs) and supplements.  This website has more information on Eliquis (apixaban): http://www.eliquis.com/eliquis/home

## 2020-03-08 NOTE — Progress Notes (Signed)
INFECTIOUS DISEASE PROGRESS NOTE  [name] is a 79 y.o. male with  Principal Problem:   MSSA bacteremia Active Problems:   Type II diabetes mellitus (HCC)   Acute right MCA stroke (HCC)   Protein-calorie malnutrition, severe  Subjective: Resting quietly  Abtx:  Anti-infectives (From admission, onward)   Start     Dose/Rate Route Frequency Ordered Stop   03/06/20 0200  ceFAZolin (ANCEF) IVPB 2g/100 mL premix        2 g 200 mL/hr over 30 Minutes Intravenous Every 8 hours 03/06/20 0129     03/05/20 2000  amoxicillin-clavulanate (AUGMENTIN) 400-57 MG/5ML suspension 500 mg  Status:  Discontinued        500 mg Oral Every 12 hours 03/05/20 1635 03/06/20 0129      Medications:  Scheduled: . apixaban  5 mg Oral BID  . digoxin  0.125 mg Oral Daily  . insulin aspart  0-5 Units Subcutaneous QHS  . insulin aspart  0-9 Units Subcutaneous TID WC  . mupirocin cream   Topical BID  . nutrition supplement (JUVEN)  1 packet Oral BID BM  . vitamin C  250 mg Oral Daily    Objective: Vital signs in last 24 hours: Temp:  [98.2 F (36.8 C)-98.6 F (37 C)] 98.3 F (36.8 C) (11/13 0418) Pulse Rate:  [47-59] 47 (11/13 0418) Resp:  [16-19] 16 (11/13 0418) BP: (122-137)/(67-71) 130/70 (11/13 0418) SpO2:  [99 %-100 %] 99 % (11/13 0418) Weight:  [64.1 kg] 64.1 kg (11/13 0500)   General appearance: fatigued and no distress Resp: clear to auscultation bilaterally Cardio: regularly irregular rhythm and systolic murmur: early systolic 1/6, crescendo at 2nd right intercostal space GI: normal findings: bowel sounds normal and soft, non-tender  Lab Results Recent Labs    03/06/20 0657 03/06/20 1241  WBC  --  9.8  HGB  --  11.4*  HCT  --  34.5*  NA 138  --   K 4.0  --   CL 105  --   CO2 22  --   BUN 19  --   CREATININE 1.32*  --    Liver Panel Recent Labs    03/06/20 0657  PROT 5.3*  ALBUMIN 2.8*  AST 36  ALT 32  ALKPHOS 69  BILITOT 0.5   Sedimentation Rate No  results for input(s): ESRSEDRATE in the last 72 hours. C-Reactive Protein No results for input(s): CRP in the last 72 hours.  Microbiology: Recent Results (from the past 240 hour(s))  Respiratory Panel by RT PCR (Flu A&B, Covid) - Nasopharyngeal Swab     Status: None   Collection Time: 02/28/20  1:21 PM   Specimen: Nasopharyngeal Swab  Result Value Ref Range Status   SARS Coronavirus 2 by RT PCR NEGATIVE NEGATIVE Final    Comment: (NOTE) SARS-CoV-2 target nucleic acids are NOT DETECTED.  The SARS-CoV-2 RNA is generally detectable in upper respiratoy specimens during the acute phase of infection. The lowest concentration of SARS-CoV-2 viral copies this assay can detect is 131 copies/mL. A negative result does not preclude SARS-Cov-2 infection and should not be used as the sole basis for treatment or other patient management decisions. A negative result may occur with  improper specimen collection/handling, submission of specimen other than nasopharyngeal swab, presence of viral mutation(s) within the areas targeted by this assay, and inadequate number of viral copies (<131 copies/mL). A negative result must be combined with clinical observations, patient history, and epidemiological information. The expected result  is Negative.  Fact Sheet for Patients:  https://www.moore.com/  Fact Sheet for Healthcare Providers:  https://www.young.biz/  This test is no t yet approved or cleared by the [place] FDA and  has been authorized for detection and/or diagnosis of SARS-CoV-2 by FDA under an Emergency Use Authorization (EUA). This EUA will remain  in effect (meaning this test can be used) for the duration of the COVID-19 declaration under Section 564(b)(1) of the Act, 21 U.S.C. section 360bbb-3(b)(1), unless the authorization is terminated or revoked sooner.     Influenza A by PCR NEGATIVE NEGATIVE Final   Influenza B by PCR NEGATIVE  NEGATIVE Final    Comment: (NOTE) The Xpert Xpress SARS-CoV-2/FLU/RSV assay is intended as an aid in  the diagnosis of influenza from Nasopharyngeal swab specimens and  should not be used as a sole basis for treatment. Nasal washings and  aspirates are unacceptable for Xpert Xpress SARS-CoV-2/FLU/RSV  testing.  Fact Sheet for Patients: https://www.moore.com/  Fact Sheet for Healthcare Providers: https://www.young.biz/  This test is not yet approved or cleared by the [place] FDA and  has been authorized for detection and/or diagnosis of SARS-CoV-2 by  FDA under an Emergency Use Authorization (EUA). This EUA will remain  in effect (meaning this test can be used) for the duration of the  Covid-19 declaration under Section 564(b)(1) of the Act, 21  U.S.C. section 360bbb-3(b)(1), unless the authorization is  terminated or revoked. Performed at [facility], [address] [place], [place] [address]   Culture, blood (routine x 2)     Status: Abnormal   Collection Time: 03/05/20  7:30 AM   Specimen: BLOOD  Result Value Ref Range Status   Specimen Description BLOOD RIGHT ANTECUBITAL  Final   Special Requests   Final    BOTTLES DRAWN AEROBIC AND ANAEROBIC Blood Culture adequate volume   Culture  Setup Time   Final    IN BOTH AEROBIC AND ANAEROBIC BOTTLES GRAM POSITIVE COCCI CRITICAL VALUE NOTED.  VALUE IS CONSISTENT WITH PREVIOUSLY REPORTED AND CALLED VALUE.    Culture (A)  Final    STAPHYLOCOCCUS AUREUS SUSCEPTIBILITIES PERFORMED ON PREVIOUS CULTURE WITHIN THE LAST 5 DAYS. Performed at [facility], [address] [place], [place] [address]    Report Status 03/07/2020 FINAL  Final  Culture, blood (routine x 2)     Status: Abnormal   Collection Time: 03/05/20  7:40 AM   Specimen: BLOOD LEFT WRIST  Result Value Ref Range Status   Specimen Description BLOOD LEFT WRIST  Final   Special Requests   Final    BOTTLES DRAWN  AEROBIC ONLY Blood Culture results may not be optimal due to an inadequate volume of blood received in culture bottles   Culture  Setup Time   Final    AEROBIC BOTTLE ONLY GRAM POSITIVE COCCI Organism ID to follow CRITICAL RESULT CALLED TO, READ BACK BY AND VERIFIED WITHMerlene Morse [facility] 03/06/20 0054 JDW Performed at [facility], [address] [place], [place] [address]    Culture STAPHYLOCOCCUS AUREUS (A)  Final   Report Status 03/07/2020 FINAL  Final   Organism ID, Bacteria STAPHYLOCOCCUS AUREUS  Final      Susceptibility   Staphylococcus aureus - MIC*    CIPROFLOXACIN <=0.5 SENSITIVE Sensitive     ERYTHROMYCIN <=0.25 SENSITIVE Sensitive     GENTAMICIN <=0.5 SENSITIVE Sensitive     OXACILLIN 0.5 SENSITIVE Sensitive     TETRACYCLINE <=1 SENSITIVE Sensitive  VANCOMYCIN <=0.5 SENSITIVE Sensitive     TRIMETH/SULFA <=10 SENSITIVE Sensitive     CLINDAMYCIN <=0.25 SENSITIVE Sensitive     RIFAMPIN <=0.5 SENSITIVE Sensitive     Inducible Clindamycin NEGATIVE Sensitive     * STAPHYLOCOCCUS AUREUS  Blood Culture ID Panel (Reflexed)     Status: Abnormal   Collection Time: 03/05/20  7:40 AM  Result Value Ref Range Status   Enterococcus faecalis NOT DETECTED NOT DETECTED Final   Enterococcus Faecium NOT DETECTED NOT DETECTED Final   Listeria monocytogenes NOT DETECTED NOT DETECTED Final   Staphylococcus species DETECTED (A) NOT DETECTED Final    Comment: CRITICAL RESULT CALLED TO, READ BACK BY AND VERIFIED WITH: V BRYK PHARMD 03/06/20 0054 JDW    Staphylococcus aureus (BCID) DETECTED (A) NOT DETECTED Final    Comment: CRITICAL RESULT CALLED TO, READ BACK BY AND VERIFIED WITH: V BRYK PHARMD 03/06/20 0054 JDW    Staphylococcus epidermidis NOT DETECTED NOT DETECTED Final   Staphylococcus lugdunensis NOT DETECTED NOT DETECTED Final   Streptococcus species NOT DETECTED NOT DETECTED Final   Streptococcus agalactiae NOT DETECTED NOT DETECTED Final   Streptococcus pneumoniae NOT  DETECTED NOT DETECTED Final   Streptococcus pyogenes NOT DETECTED NOT DETECTED Final   A.calcoaceticus-baumannii NOT DETECTED NOT DETECTED Final   Bacteroides fragilis NOT DETECTED NOT DETECTED Final   Enterobacterales NOT DETECTED NOT DETECTED Final   Enterobacter cloacae complex NOT DETECTED NOT DETECTED Final   Escherichia coli NOT DETECTED NOT DETECTED Final   Klebsiella aerogenes NOT DETECTED NOT DETECTED Final   Klebsiella oxytoca NOT DETECTED NOT DETECTED Final   Klebsiella pneumoniae NOT DETECTED NOT DETECTED Final   Proteus species NOT DETECTED NOT DETECTED Final   Salmonella species NOT DETECTED NOT DETECTED Final   Serratia marcescens NOT DETECTED NOT DETECTED Final   Haemophilus influenzae NOT DETECTED NOT DETECTED Final   Neisseria meningitidis NOT DETECTED NOT DETECTED Final   Pseudomonas aeruginosa NOT DETECTED NOT DETECTED Final   Stenotrophomonas maltophilia NOT DETECTED NOT DETECTED Final   Candida albicans NOT DETECTED NOT DETECTED Final   Candida auris NOT DETECTED NOT DETECTED Final   Candida glabrata NOT DETECTED NOT DETECTED Final   Candida krusei NOT DETECTED NOT DETECTED Final   Candida parapsilosis NOT DETECTED NOT DETECTED Final   Candida tropicalis NOT DETECTED NOT DETECTED Final   Cryptococcus neoformans/gattii NOT DETECTED NOT DETECTED Final   Meth resistant mecA/C and MREJ NOT DETECTED NOT DETECTED Final    Comment: Performed at [facility], [address], [place], [place] [address]    Studies/Results: DG Cervical Spine 2-3[facility]clearing  Result Date: 03/07/2020 CLINICAL DATA:  Neck pain. EXAM: LIMITED CERVICAL SPINE FOR TRAUMA CLEARING - 2-3 VIEW COMPARISON:  None. FINDINGS: There is no evidence of cervical spine fracture or prevertebral soft tissue swelling. Loss of the normal cervical lordosis with mild reversal. No other significant bone abnormalities are identified. IMPRESSION: No acute osseous injury of the cervical spine. 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 Electronically Signed   By: Elige Ko   On: 03/07/2020 11:48     Assessment/Plan: MSSA bacteremia R MCA CVA DM2  Total days of antibiotics: 4 ancef  No new labs today Afebrile His repeat BCX 11-12 are pending.  Continue ancef Needs TEE  Will follow remotely on 11-14         Johny Sax MD, FACP Infectious Diseases (pager) [phone] [email or web address] 03/08/2020, 8:29 AM  LOS: 3 days

## 2020-03-08 NOTE — IPOC Note (Signed)
Overall Plan of Care [facility]) Patient Details Name: [name] MRN: [id number] DOB: [birth year]  Admitting Diagnosis: MSSA bacteremia  Hospital Problems: Principal Problem:   MSSA bacteremia Active Problems:   Type II diabetes mellitus (HCC)   Acute right MCA stroke (HCC)   Protein-calorie malnutrition, severe     Functional Problem List: Nursing Behavior, Bladder, Bowel, Endurance, Medication Management, Nutrition, Pain, Perception, Safety, Skin Integrity  PT Balance, Behavior, Endurance, Motor, Nutrition, Pain, Perception, Safety, Sensory, Skin Integrity  OT Balance, Cognition, Endurance, Motor, Safety, Perception  SLP Cognition, Linguistic, Nutrition, Safety  TR         Basic ADL's: OT Eating, Grooming, Bathing, Toileting, Dressing     Advanced  ADL's: OT       Transfers: PT Bed Mobility, Bed to Chair, Car, Occupational psychologist, Research scientist (life sciences): PT Ambulation, Psychologist, prison and probation services, Stairs     Additional Impairments: OT Fuctional Use of Upper Extremity  SLP Swallowing, Communication, Social Cognition expression Problem Solving, Memory, Attention, Awareness  TR      Anticipated Outcomes Item Anticipated Outcome  Self Feeding setup assist  Swallowing  Min A   Basic self-care  min assist  Toileting  min assist   Bathroom Transfers min assist  Bowel/Bladder  reduce incontience and maintain regular pattern of emptying  Transfers  grossly SPV/CGA  Locomotion  grossly SPV/CGA  Communication  Supervision  Cognition  Supervision-Min  Pain  less than 4  Safety/Judgment  remain free of falls, skin breakdown and infection   Therapy Plan: PT Intensity: Minimum of 1-2 x/day ,45 to 90 minutes PT Frequency: 5 out of 7 days PT Duration Estimated Length of Stay: 16-19 days OT Intensity: Minimum of 1-2 x/day, 45 to 90 minutes OT Frequency: 5 out of 7 days OT Duration/Estimated Length of Stay: 19-21 days SLP Intensity: Minumum of 1-2 x/day, 30 to 90  minutes SLP Frequency: 3 to 5 out of 7 days SLP Duration/Estimated Length of Stay: 2.5 weeks   Due to the current state of emergency, patients may not be receiving their 3-hours of Medicare-mandated therapy.   Team Interventions: Nursing Interventions Patient/Family Education, Bladder Management, Bowel Management, Disease Management/Prevention, Pain Management, Medication Management, Skin Care/Wound Management, Cognitive Remediation/Compensation, Discharge Planning, Dysphagia/Aspiration Precaution Training  PT interventions Ambulation/gait training, Cognitive remediation/compensation, DME/adaptive equipment instruction, Discharge planning, Functional mobility training, Pain management, Psychosocial support, Splinting/orthotics, Therapeutic Activities, UE/LE Strength taining/ROM, Visual/perceptual remediation/compensation, Warden/ranger, Community reintegration, Disease management/prevention, Functional electrical stimulation, Neuromuscular re-education, Patient/family education, Skin care/wound management, Stair training, Therapeutic Exercise, UE/LE Coordination activities, Wheelchair propulsion/positioning  OT Interventions Warden/ranger, Cognitive remediation/compensation, DME/adaptive equipment instruction, Discharge planning, Disease mangement/prevention, Functional electrical stimulation, Neuromuscular re-education, Patient/family education, Therapeutic Exercise, UE/LE Coordination activities, Wheelchair propulsion/positioning, Visual/perceptual remediation/compensation, UE/LE Strength taining/ROM, Therapeutic Activities, Functional mobility training, Pain management, Psychosocial support, Self Care/advanced ADL retraining  SLP Interventions Cognitive remediation/compensation, Cueing hierarchy, Functional tasks, Patient/family education, Therapeutic Activities, Speech/Language facilitation, Dysphagia/aspiration precaution training, Internal/external aids  TR Interventions     SW/CM Interventions Discharge Planning, Psychosocial Support, Patient/Family Education   Barriers to Discharge MD  Medical stability, Home enviroment access/loayout, IV antibiotics, New diabetic, Incontinence, Lack of/limited family support, Weight, Behavior and impaired/sedated  Nursing      PT Decreased caregiver support, Home environment access/layout, Incontinence, Medication compliance plan to dc to dtrs house in [place], questionnable 24/7 assist/supervision available  OT Decreased caregiver support Will need 24 hr supervision, unsure if family can provide as his daughter works.  SLP Decreased caregiver support  pt lived alone PTA and stated daughter works  SW Lack of/limited family support, Decreased caregiver support     Team Discharge Planning: Destination: PT-Home ,OT- Home (daugher's house) , SLP-Home Projected Follow-up: PT-Home health PT, Outpatient PT, 24 hour supervision/assistance, OT-  Home health OT, 24 hour supervision/assistance, SLP-Home Health SLP, 24 hour supervision/assistance Projected Equipment Needs: PT-To be determined, OT- To be determined, SLP-None recommended by SLP Equipment Details: PT-patient owns [facility], OT-  Patient/family involved in discharge planning: PT- Patient, Family member/caregiver,  OT-Patient, SLP-Patient  MD ELOS: 16-19 days Medical Rehab Prognosis:  Fair Assessment: Pt is a 79 yr old male with hx of CKD, HTN, DM- cannot tolerate metformin due to Cr;  With new R MCA stroke with sedation/lethargy since then; also has MSSA bacteremia- Cx's pending- Also has Afib, on Eliquis and apical thrombus- needs TEE- also has Dysphagia- on D1 nectar thick liquids-   Goals min A by d/c.     See Team Conference Notes for weekly updates to the plan of care

## 2020-03-08 NOTE — H&P (View-Only) (Signed)
[facility] PHYSICAL MEDICINE & REHABILITATION PROGRESS NOTE   Subjective/Complaints:   Pt sleepy this AM- laying curled up in bed- head turned to side- looks uncomfortable position.   Woke up briefly- waiting for NT to feed him    ROS: too sedated/sleepy to answer questions  Objective:   DG Cervical Spine 2-3Vclearing  Result Date: 03/07/2020 CLINICAL DATA:  Neck pain. EXAM: LIMITED CERVICAL SPINE FOR TRAUMA CLEARING - 2-3 VIEW COMPARISON:  None. FINDINGS: There is no evidence of cervical spine fracture or prevertebral soft tissue swelling. Loss of the normal cervical lordosis with mild reversal. No other significant bone abnormalities are identified. IMPRESSION: No acute osseous injury of the cervical spine. 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 Electronically Signed   By: Elige Ko   On: 03/07/2020 11:48   Recent Labs    03/06/20 1241  WBC 9.8  HGB 11.4*  HCT 34.5*  PLT 233   Recent Labs    03/06/20 0657  NA 138  K 4.0  CL 105  CO2 22  GLUCOSE 135*  BUN 19  CREATININE 1.32*  CALCIUM 8.4*    Intake/Output Summary (Last 24 hours) at 03/08/2020 1337 Last data filed at 03/08/2020 0701 Gross per 24 hour  Intake 720 ml  Output --  Net 720 ml        Physical Exam: Vital Signs Blood pressure 130/70, pulse (!) 47, temperature 98.3 F (36.8 C), resp. rate 16, height 5\' 11"  (1.803 m), weight 64.1 kg, SpO2 99 %.   General: sleepy- woke up briefly- but very sleepy- NAD HEENT: head turned to side- appears uncomfortable Heart: bradycardic- irregular Chest: CTA B/L- no W/R/R- good air movement- had a juicy cough, but when listened, CTA B/L Abdomen: Soft, NT, ND, (+)BS   Musculoskeletal:     Cervical back: Normal range of motion and neck supple.     Comments: Pain with attempts at knee extension/flexion.  Left hands with edematous discolored dorsal digits.  +TTP over cervical spine  Skin:    Comments: Bilateral knees with  foam dressings covering ruptured blisters.   Neurological:     Mental Status: He is oriented to person, place, and time. He is lethargic.     Comments: Somnolent but aroused briefly to sternal rubs. He was able to state name as " " and answer orientation questions with choice of two. Did not open eyes or attempt to interact Motor: Limited due to participation  Psychiatric:     Comments: sleepy    Assessment/Plan: 1. Functional deficits which require 3+ hours per day of interdisciplinary therapy in a comprehensive inpatient rehab setting.  Physiatrist is providing close team supervision and 24 hour management of active medical problems listed below.  Physiatrist and rehab team continue to assess barriers to discharge/monitor patient progress toward functional and medical goals  Care Tool:  Bathing    Body parts bathed by patient: Chest, Abdomen, Right upper leg, Left upper leg, Face, Right arm, Front perineal area   Body parts bathed by helper: Buttocks, Left lower leg, Right lower leg     Bathing assist Assist Level: Moderate Assistance - Patient 50 - 74%     Upper Body Dressing/Undressing Upper body dressing   What is the patient wearing?: Hospital gown only    Upper body assist Assist Level: Maximal Assistance - Patient 25 - 49%    Lower Body Dressing/Undressing Lower body dressing      What is the patient wearing?: Pants, Incontinence brief  Lower body assist Assist for lower body dressing: Maximal Assistance - Patient 25 - 49%     Toileting Toileting    Toileting assist Assist for toileting: 2 Helpers     Transfers Chair/bed transfer  Transfers assist     Chair/bed transfer assist level: Moderate Assistance - Patient 50 - 74%     Locomotion Ambulation   Ambulation assist      Assist level: 2 helpers Assistive device: Hand held assist Max distance: 25   Walk 10 feet activity   Assist     Assist level: 2 helpers Assistive  device: Hand held assist   Walk 50 feet activity   Assist Walk 50 feet with 2 turns activity did not occur: Safety/medical concerns (due to patient fatigue/weakness)         Walk 150 feet activity   Assist Walk 150 feet activity did not occur: Safety/medical concerns (due to patient fatigue/weakness)         Walk 10 feet on uneven surface  activity   Assist Walk 10 feet on uneven surfaces activity did not occur: Safety/medical concerns (due to patient fatigue/weakness)         Wheelchair     Assist Will patient use wheelchair at discharge?: Yes Type of Wheelchair: Manual    Wheelchair assist level: Dependent - Patient 0% (TIS wc) Max wheelchair distance: 150    Wheelchair 50 feet with 2 turns activity    Assist        Assist Level: Dependent - Patient 0%   Wheelchair 150 feet activity     Assist      Assist Level: Dependent - Patient 0%   Blood pressure 130/70, pulse (!) 47, temperature 98.3 F (36.8 C), resp. rate 16, height 5\' 11"  (1.803 m), weight 64.1 kg, SpO2 99 %.    Medical Problem List and Plan: 1. 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               -patient may shower             -ELOS/Goals: 22-27 days/min a             Continue CIR, did great with PT this morning  2.  Antithrombotics: -DVT/anticoagulation:  Pharmaceutical: Other (comment)--Eliquis.              -antiplatelet therapy: N/A--has been off ASA/Brilinta.  3. Chronic HA/Cervicalgia/Pain Management: Was on flexeril prn PTA. Will monitor N/A. Will order kpad and cervical spine XR given new neck pain and TTP on cervical spine. 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             Monitor with increased  exertion 4. Mood: LCSW to follow for evaluation and support.              -antipsychotic agents: Monitor for now.  5. Neuropsych: This patient is not capable of making decisions on his own behalf. 6. Skin/Wound Care: Routine pressure relief measures. Protein supplement due to evidence of malnutrition.  7. Fluids/Electrolytes/Nutrition: Monitor I/Os.              CMP 11/11 is stable.  8. Gastroenteritis: Had multiple episodes of N/V on 11/09 with 4 incontinent stools and low grade fever. Monitor for GI virus. 9. HTN: Monitor BP tid--educate on importance of  compliance. SBP goal 130-150  11/13- BP controlled- con't regimen              Monitor with increased exertion. 10. ICM/Heart failure with persistent apical thrombus: Monitor for signs and symptoms of fluid overload. Daily weights. Continue Eliquis.  11. A fib: Monitor HR tid--continue Lanoxin and Eliquis.              Monitor with increased activity 12. Acute on chronic renal failure: Likely due to gastroenteritis. SCr 1.55-->1.3-->1.53.   Will recheck lytes in am.              Cr trending down on 11/11  11/13- Cr down to 1.32- con't to monitor  13. Electrolytes abnormalities: Resolved post supplementation--recheck magnesium/potassium levels in am.  14. Anemia: Recheck iron levels.                    CBC ordered for tomorrow a.m. 15. T2DM: Hgb A1C-6.8.  Will monitor BS ac/hs. Discontinue metformin due to CKD. Add CM restrictions to diet.              Monitor with increased mobility  11/12: CBG elevated to 246 last night. Ordered consult to RD for diet education  11/13- BG 88-271- very labile- primary physician might need to start insulin/d/w pt.  16. Aspiration Pneumonitis: Started on Augmentin 11/10 due to concerns of infection.  17. Dysphagia: Continue CIR SLP 18. MSSA bacteremia  11/13- needs TEE this upcoming week, per ID note. Monitoring/following blood Cx's.   LOS: 3 days A FACE TO FACE EVALUATION WAS PERFORMED  [name] 03/08/2020, 1:37 PM

## 2020-03-09 LAB — GLUCOSE, CAPILLARY
Glucose-Capillary: 102 mg/dL — ABNORMAL HIGH (ref 70–99)
Glucose-Capillary: 114 mg/dL — ABNORMAL HIGH (ref 70–99)
Glucose-Capillary: 182 mg/dL — ABNORMAL HIGH (ref 70–99)
Glucose-Capillary: 128 mg/dL — ABNORMAL HIGH (ref 70–99)

## 2020-03-09 NOTE — Anesthesia Preprocedure Evaluation (Addendum)
Anesthesia Evaluation  Patient identified by MRN, date of birth, ID band Patient confused    Reviewed: Allergy & Precautions, NPO status , Patient's Chart, lab work & pertinent test results  Airway Mallampati: II  TM Distance: >3 FB Neck ROM: Full    Dental no notable dental hx. (+) Poor Dentition, Dental Advisory Given, Missing, Chipped,    Pulmonary COPD, former smoker,    Pulmonary exam normal  + decreased breath sounds      Cardiovascular hypertension, Pt. on medications + CAD, + Past MI, + Cardiac Stents and +CHF (ischemic CM in 2017, LVEF has since recovered)  Normal cardiovascular exam+ dysrhythmias (eliquis) Atrial Fibrillation  Rhythm:Regular Rate:Normal  Echo 03/01/20:  1. Left ventricular ejection fraction, by estimation, is approxiamtely  50%.  2. There is an elongated echodensity at the inferior apex of the left  ventricle measuring approximately 1 x 2cm at largest dimentions, most  consistent with mural thrombus. Does not appear freely mobile. There is  hypokinesis to akinesis in this  myocardial segment distribution.  3. Right ventricular systolic function is normal. The right ventricular  size is normal.    Neuro/Psych CVA (R MCA CVA, L sided weakness esp L arm), Residual Symptoms negative psych ROS   GI/Hepatic negative GI ROS, Neg liver ROS,   Endo/Other  diabetes, Well Controlled, Type 2, Insulin Dependent  Renal/GU Renal InsufficiencyRenal diseaseCr 1.32  negative genitourinary   Musculoskeletal negative musculoskeletal ROS (+)   Abdominal Normal abdominal exam  (+)   Peds  Hematology  (+) Blood dyscrasia, anemia , hct 34.5, plt 233   Anesthesia Other Findings bacteremia  Reproductive/Obstetrics negative OB ROS                            Anesthesia Physical Anesthesia Plan  ASA: III  Anesthesia Plan: MAC   Post-op Pain Management:    Induction:   PONV Risk  Score and Plan: 2 and Propofol infusion and TIVA  Airway Management Planned: Natural Airway and Simple Face Mask  Additional Equipment: None  Intra-op Plan:   Post-operative Plan:   Informed Consent: I have reviewed the patients History and Physical, chart, labs and discussed the procedure including the risks, benefits and alternatives for the proposed anesthesia with the patient or authorized representative who has indicated his/her understanding and acceptance.     Dental advisory given and Consent reviewed with POA  Plan Discussed with: CRNA  Anesthesia Plan Comments: (Spoke with daughter Olevia Bowens for anesthesia consent)       Anesthesia Quick Evaluation

## 2020-03-10 ENCOUNTER — Inpatient Hospital Stay (HOSPITAL_COMMUNITY): Payer: Medicare PPO

## 2020-03-10 ENCOUNTER — Inpatient Hospital Stay (HOSPITAL_COMMUNITY): Payer: Medicare PPO | Admitting: Anesthesiology

## 2020-03-10 ENCOUNTER — Inpatient Hospital Stay: Payer: Medicare PPO

## 2020-03-10 ENCOUNTER — Encounter (HOSPITAL_COMMUNITY): Payer: Self-pay | Admitting: Physical Medicine & Rehabilitation

## 2020-03-10 ENCOUNTER — Encounter (HOSPITAL_COMMUNITY)
Admission: RE | Disposition: A | Discharge status: Skilled Nursing Facility | Payer: Self-pay | Source: Intra-hospital | Attending: Physical Medicine & Rehabilitation

## 2020-03-10 ENCOUNTER — Inpatient Hospital Stay (HOSPITAL_COMMUNITY): Payer: Medicare PPO | Admitting: Speech Pathology

## 2020-03-10 ENCOUNTER — Ambulatory Visit (HOSPITAL_COMMUNITY): Admission: RE | Admit: 2020-03-10 | Payer: Medicare PPO | Source: Home / Self Care | Admitting: Cardiology

## 2020-03-10 ENCOUNTER — Inpatient Hospital Stay (HOSPITAL_COMMUNITY): Payer: Medicare PPO | Admitting: Occupational Therapy

## 2020-03-10 DIAGNOSIS — I340 Nonrheumatic mitral (valve) insufficiency: Secondary | ICD-10-CM

## 2020-03-10 DIAGNOSIS — I639 Cerebral infarction, unspecified: Secondary | ICD-10-CM

## 2020-03-10 DIAGNOSIS — R7881 Bacteremia: Secondary | ICD-10-CM

## 2020-03-10 HISTORY — PX: TEE WITHOUT CARDIOVERSION: SHX5443

## 2020-03-10 LAB — GLUCOSE, CAPILLARY
Glucose-Capillary: 125 mg/dL — ABNORMAL HIGH (ref 70–99)
Glucose-Capillary: 137 mg/dL — ABNORMAL HIGH (ref 70–99)
Glucose-Capillary: 71 mg/dL (ref 70–99)
Glucose-Capillary: 119 mg/dL — ABNORMAL HIGH (ref 70–99)
Glucose-Capillary: 168 mg/dL — ABNORMAL HIGH (ref 70–99)
Glucose-Capillary: 141 mg/dL — ABNORMAL HIGH (ref 70–99)

## 2020-03-10 LAB — CBC
HCT: 31.8 % — ABNORMAL LOW (ref 39.0–52.0)
Hemoglobin: 10.4 g/dL — ABNORMAL LOW (ref 13.0–17.0)
MCH: 31.4 pg (ref 26.0–34.0)
MCHC: 32.7 g/dL (ref 30.0–36.0)
MCV: 96.1 fL (ref 80.0–100.0)
Platelets: 256 10*3/uL (ref 150–400)
RBC: 3.31 MIL/uL — ABNORMAL LOW (ref 4.22–5.81)
RDW: 12.6 % (ref 11.5–15.5)
WBC: 8.2 10*3/uL (ref 4.0–10.5)
nRBC: 0 % (ref 0.0–0.2)

## 2020-03-10 LAB — BASIC METABOLIC PANEL
Anion gap: 8 (ref 5–15)
BUN: 17 mg/dL (ref 8–23)
CO2: 26 mmol/L (ref 22–32)
Calcium: 8.5 mg/dL — ABNORMAL LOW (ref 8.9–10.3)
Chloride: 102 mmol/L (ref 98–111)
Creatinine, Ser: 1.1 mg/dL (ref 0.61–1.24)
GFR, Estimated: >60 mL/min (ref >60)
Glucose, Bld: 242 mg/dL — ABNORMAL HIGH (ref 70–99)
Potassium: 5.1 mmol/L (ref 3.5–5.1)
Sodium: 136 mmol/L (ref 135–145)

## 2020-03-10 SURGERY — ECHOCARDIOGRAM, TRANSESOPHAGEAL
Anesthesia: Monitor Anesthesia Care

## 2020-03-10 MED ORDER — LIVING BETTER WITH HEART FAILURE BOOK: Freq: Once | Status: AC

## 2020-03-10 MED ORDER — SODIUM CHLORIDE 0.9 % IV SOLN: INTRAVENOUS | Status: DC

## 2020-03-10 MED ORDER — LIVING WELL WITH DIABETES BOOK
Freq: Once | Status: AC
  Filled 2020-03-10: qty 1

## 2020-03-10 MED ORDER — PROPOFOL 10 MG/ML IV BOLUS
INTRAVENOUS | Status: DC | PRN
  Administered 2020-03-10 (×2): 10 mg via INTRAVENOUS
  Administered 2020-03-10: 20 mg via INTRAVENOUS

## 2020-03-10 MED ORDER — PROPOFOL 500 MG/50ML IV EMUL
INTRAVENOUS | Status: DC | PRN
  Administered 2020-03-10: 100 ug/kg/min via INTRAVENOUS

## 2020-03-10 MED ORDER — LACTATED RINGERS IV SOLN: INTRAVENOUS | Status: DC

## 2020-03-10 MED ORDER — DEXTROSE 50 % IV SOLN
INTRAVENOUS | Status: DC | PRN
  Administered 2020-03-10: 12.5 g via INTRAVENOUS

## 2020-03-10 MED ORDER — SODIUM CHLORIDE 0.9% FLUSH
10.0000 mL | INTRAVENOUS | Status: DC | PRN
  Administered 2020-03-14 – 2020-03-17 (×2): 10 mL

## 2020-03-10 MED ORDER — BLOOD PRESSURE CONTROL BOOK
Freq: Once | Status: AC
  Filled 2020-03-10: qty 1

## 2020-03-10 NOTE — Progress Notes (Addendum)
PHARMACY CONSULT NOTE FOR:  OUTPATIENT  PARENTERAL ANTIBIOTIC THERAPY (OPAT)  Indication: MSSA Bacteremia Regimen: cefazolin 2g IV q8h  End date: 04/04/2020  IV antibiotic discharge orders are pended. To discharging provider:  please sign these orders via discharge navigator,  Select New Orders & click on the button choice - Manage This Unsigned Work.     Thank you for allowing pharmacy to be a part of this patient's care.  Jeannetta Nap 03/10/2020, 2:09 PM

## 2020-03-10 NOTE — Progress Notes (Signed)
Despite receiving Trazadone, pt did not sleep at all through the night. Attempting to get out of bed, and pushing call bell very frequently without any needs.

## 2020-03-10 NOTE — Progress Notes (Signed)
[facility] PHYSICAL MEDICINE & REHABILITATION PROGRESS NOTE   Subjective/Complaints:   Pt back from TEE, still mildly sedated but oriented to person place and situation     ROS: too sedated/sleepy to answer questions  Objective:   ECHO TEE  Result Date: 03/10/2020    TRANSESOPHOGEAL ECHO REPORT   Patient Name:   [name] Date of Exam: 03/10/2020 Medical Rec #:  [id number]     Height:       71.0 in Accession #:    [id number]    Weight:       142.4 lb Date of Birth:  05-11-1940      BSA:          1.825 m Patient Age:    79 years      BP:           158/82 mmHg Patient Gender: M             HR:           92 bpm. Exam Location:  Inpatient Procedure: Transesophageal Echo Indications:    Bacteremia  History:        Patient has prior history of Echocardiogram examinations. CHF;                 Previous Myocardial Infarction and CAD.  Sonographer:    Delcie Roch Referring Phys: [id number] [facility] PROCEDURE: The transesophogeal probe was passed without difficulty through the esophogus of the patient. Sedation performed by different physician. Image quality was good. The patient's vital signs; including heart rate, blood pressure, and oxygen saturation; remained stable throughout the procedure. The patient developed no complications during the procedure. IMPRESSIONS  1. No vegetation was identified. No thrombus was seen in the left ventricle but rather very prominent trabeculations.  2. Left ventricular ejection fraction, by estimation, is 45 to 50%. The left ventricle has mildly decreased function. The left ventricle demonstrates regional wall motion abnormalities (see scoring diagram/findings for description).  3. Right ventricular systolic function is normal. The right ventricular size is normal.  4. No left atrial/left atrial appendage thrombus was detected.  5. The mitral valve is normal in structure. Mild mitral valve regurgitation. No evidence of mitral stenosis.  6. The aortic valve is  normal in structure. Aortic valve regurgitation is not visualized. No aortic stenosis is present.  7. The inferior vena cava is normal in size with greater than 50% respiratory variability, suggesting right atrial pressure of 3 mmHg. Conclusion(s)/Recommendation(s): FINDINGS  Left Ventricle: Left ventricular ejection fraction, by estimation, is 45 to 50%. The left ventricle has mildly decreased function. The left ventricle demonstrates regional wall motion abnormalities. The left ventricular internal cavity size was normal in size. There is no left ventricular hypertrophy.  LV Wall Scoring: The mid and distal lateral wall, entire inferior wall, and posterior wall are hypokinetic. Right Ventricle: The right ventricular size is normal. No increase in right ventricular wall thickness. Right ventricular systolic function is normal. Left Atrium: Left atrial size was normal in size. No left atrial/left atrial appendage thrombus was detected. Right Atrium: Right atrial size was normal in size. Pericardium: There is no evidence of pericardial effusion. Mitral Valve: The mitral valve is normal in structure. Mild mitral valve regurgitation. No evidence of mitral valve stenosis. There is no evidence of mitral valve vegetation. Tricuspid Valve: The tricuspid valve is normal in structure. Tricuspid valve regurgitation is not demonstrated. No evidence of tricuspid stenosis. There is no evidence of tricuspid valve vegetation. Aortic  Valve: The aortic valve is normal in structure. Aortic valve regurgitation is not visualized. No aortic stenosis is present. There is no evidence of aortic valve vegetation. Pulmonic Valve: The pulmonic valve was normal in structure. Pulmonic valve regurgitation is not visualized. No evidence of pulmonic stenosis. Aorta: The aortic root is normal in size and structure. Venous: The inferior vena cava is normal in size with greater than 50% respiratory variability, suggesting right atrial pressure of 3  mmHg. IAS/Shunts: No atrial level shunt detected by color flow Doppler. Tobias Alexander MD Electronically signed by Tobias Alexander MD Signature Date/Time: 03/10/2020/9:02:00 AM    Final    No results for input(s): WBC, HGB, HCT, PLT in the last 72 hours. No results for input(s): NA, K, CL, CO2, GLUCOSE, BUN, CREATININE, CALCIUM in the last 72 hours.  Intake/Output Summary (Last 24 hours) at 03/10/2020 0945 Last data filed at 03/10/2020 0400 Gross per 24 hour  Intake 597.41 ml  Output --  Net 597.41 ml        Physical Exam: Vital Signs Blood pressure 118/64, pulse 70, temperature 97.7 F (36.5 C), temperature source Temporal, resp. rate 15, height 5\' 11"  (1.803 m), weight 64.6 kg, SpO2 100 %.    General: No acute distress Mood and affect are appropriate, drowsy Heart: Regular rate and rhythm no rubs murmurs or extra sounds Lungs: Clear to auscultation, breathing unlabored, no rales or wheezes Abdomen: Positive bowel sounds, soft nontender to palpation, nondistended Extremities: No clubbing, cyanosis, or edema Skin: No evidence of breakdown, no evidence of rash Neurologic: Cranial nerves II through XII intact, motor strength is 5/5 in RIght and 4/5 Left deltoid, bicep, tricep, grip, hip flexor, knee extensors, ankle dorsiflexor and plantar flexor Sensory examreports equal sensation to LT in BUE and BLE Cerebellar exam normal finger to nose to finger Musculoskeletal: Full range of motion in all 4 extremities. No joint swelling      Assessment/Plan: 1. Functional deficits which require 3+ hours per day of interdisciplinary therapy in a comprehensive inpatient rehab setting.  Physiatrist is providing close team supervision and 24 hour management of active medical problems listed below.  Physiatrist and rehab team continue to assess barriers to discharge/monitor patient progress toward functional and medical goals  Care Tool:  Bathing    Body parts bathed by patient: Chest,  Abdomen, Right upper leg, Left upper leg, Face, Right arm, Front perineal area   Body parts bathed by helper: Buttocks, Left lower leg, Right lower leg, Left arm     Bathing assist Assist Level: Moderate Assistance - Patient 50 - 74%     Upper Body Dressing/Undressing Upper body dressing   What is the patient wearing?: Pull over shirt    Upper body assist Assist Level: Moderate Assistance - Patient 50 - 74%    Lower Body Dressing/Undressing Lower body dressing      What is the patient wearing?: Pants, Incontinence brief     Lower body assist Assist for lower body dressing: Maximal Assistance - Patient 25 - 49%     Toileting Toileting    Toileting assist Assist for toileting: Dependent - Patient 0%     Transfers Chair/bed transfer  Transfers assist     Chair/bed transfer assist level: Moderate Assistance - Patient 50 - 74%     Locomotion Ambulation   Ambulation assist      Assist level: 2 helpers (+2 mod  A) Assistive device: Other (comment) (3 Musketeer) Max distance: 71ft   Walk 10 feet activity  Assist     Assist level: 2 helpers (+2 mod  A) Assistive device: Other (comment) (3 Musketeer)   Walk 50 feet activity   Assist Walk 50 feet with 2 turns activity did not occur: Safety/medical concerns (due to patient fatigue/weakness)  Assist level: 2 helpers (+2 mod  A) Assistive device: Other (comment) (3 Musketeer)    Walk 150 feet activity   Assist Walk 150 feet activity did not occur: Safety/medical concerns (due to patient fatigue/weakness)         Walk 10 feet on uneven surface  activity   Assist Walk 10 feet on uneven surfaces activity did not occur: Safety/medical concerns (due to patient fatigue/weakness)         Wheelchair     Assist Will patient use wheelchair at discharge?: Yes Type of Wheelchair: Manual    Wheelchair assist level: Dependent - Patient 0% (TIS wc) Max wheelchair distance: 150    Wheelchair 50  feet with 2 turns activity    Assist        Assist Level: Dependent - Patient 0%   Wheelchair 150 feet activity     Assist      Assist Level: Dependent - Patient 0%   Blood pressure 118/64, pulse 70, temperature 97.7 F (36.5 C), temperature source Temporal, resp. rate 15, height 5\' 11"  (1.803 m), weight 64.6 kg, SpO2 100 %.    Medical Problem List and Plan: 1. 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               -patient may shower             -ELOS/Goals: 22-27 days/min a             Continue CIR, did great with PT this morning  2.  Antithrombotics: -DVT/anticoagulation:  Pharmaceutical: Other (comment)--Eliquis.              -antiplatelet therapy: N/A--has been off ASA/Brilinta.  3. Chronic HA/Cervicalgia/Pain Management: Was on flexeril prn PTA. Will monitor N/A. Will order kpad and cervical spine XR given new neck pain and TTP on cervical spine. 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             Monitor with increased exertion 4. Mood: LCSW to follow for evaluation and support.              -antipsychotic agents: Monitor for now.  5. Neuropsych: This patient is not capable of making decisions on his own behalf. 6. Skin/Wound Care: Routine pressure relief measures. Protein supplement due to evidence of malnutrition.  7. Fluids/Electrolytes/Nutrition: Monitor I/Os.              CMP 11/11 is stable.  8. Gastroenteritis: Had multiple episodes of N/V on 11/09 with 4 incontinent stools and low grade fever. Monitor for GI virus. 9. HTN: Monitor BP tid--educate on importance of compliance. SBP goal 130-150  11/13- BP controlled- con't regimen              Monitor with increased exertion. 10. ICM/Heart  failure with persistent apical thrombus: Monitor for signs and symptoms of fluid overload. Daily weights. Continue Eliquis.  11. A fib: Monitor HR tid--continue Lanoxin and Eliquis.              Monitor with increased activity 12.  Acute on chronic renal failure: Likely due to gastroenteritis. SCr 1.55-->1.3-->1.53.   Will recheck lytes in am.              Cr trending down on 11/11  11/13- Cr down to 1.32- con't to monitor  13. Electrolytes abnormalities: Resolved post supplementation--recheck magnesium/potassium levels in am.  14. Anemia: Recheck iron levels.                    CBC ordered for tomorrow a.m. 15. T2DM: Hgb A1C-6.8.  Will monitor BS ac/hs. Discontinue metformin due to CKD. Add CM restrictions to diet.              Monitor with increased mobility CBG (last 3)  Recent Labs    03/09/20 1652 03/09/20 2100 03/10/20 0631  GLUCAP 182* 128* 125*  fair control  SSI  16. Aspiration Pneumonitis: Started on Augmentin 11/10 due to concerns of infection.  17. Dysphagia: Continue CIR SLP 18. MSSA bacteremia  TEE showed no valve vegetations, no other sig structural abnl in report cardiology to f/u with recs  LOS: 5 days A FACE TO FACE EVALUATION WAS PERFORMED  Erick Colace 03/10/2020, 9:45 AM

## 2020-03-10 NOTE — Progress Notes (Signed)
Patient ID: [name], male   DOB: May 04, 1940, 79 y.o.   MRN: [id number] Met with the patient to review role of the nurse CM and collaboration with the SW to facilitate preparation for discharge. Reviewed secondary risk factors for stroke including HF, HTN, HLF and DM along with Afib. Patient confirmed he had not been eating well PTA and had lost approximately 30 lbs. Patient given information on HF, HTN and DM along with DASH diet and carbohydrate counting as A1c was 6.8. Patient noted he lives alone however does have a son who may be able to assist at discharge. Patient denies other questions or concerns at present. Continue to follow along to discharge.  Margarito Liner

## 2020-03-10 NOTE — Progress Notes (Addendum)
[facility] Infectious Diseases Follow Up Note  Patient Identification: Patient Name: [name] MRN: [id number] Admit Date: 03/05/2020  3:24 PM Age: 79 y.o.Today's Date: 03/10/2020   Reason for Visit: Follow Up on MSSA bacteremia   Principal Problem:   MSSA bacteremia Active Problems:   Type II diabetes mellitus (HCC)   Acute right MCA stroke (HCC)   Protein-calorie malnutrition, severe   Antibiotics: Cefazolin Day 6   Assessment High Grade MSSA Bacteremia - Unclear cause so far with no revealing TTE for vegetations. TEE today with no vegetations.    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 Unclear cause of bacteremia. The Rt MCA infarct is less likely related to septic emboli given known h/o LV thrombus and obstructive disease in the Rt MCA.  -Will need to treat with cefazolin for at least 4 weeks from date of negative blood cultures on 03/07/20. End date 04/04/20 - Monitor CBC and BMP while on IV abx. - Patient currently in Inpatient rehab due to CVA. Will probably need a long term access ( PICC) for IV abx. Will defer it to the primary service.  - Please call us back if patient is planned for discharge before completion of 4 weeks of IV cefazolin for coordination and  monitoring of IV abx Outpatient.   Rest of the management as per the primary team. Thank you for the consult.   Odette Fraction, MD Infectious Diseases  [facility]   To contact the attending provider between 8A-5P or the covering provider during after hours 5P-8A, please log into the web site [email or web address] and access using universal Jamul password for that web site. If you do not have the  password, please call the hospital operator. ______________________________________________________________________ Subjective patient seen and examined at the bedside. He seems to be agitated last night. However, was quiet on exam today. Denies any complaints. No pain.tenderness and swelling in the rt shoulder. No issues with the IV abx.   Vitals BP 118/64   Pulse 70   Temp 97.7 F (36.5 C) (Temporal)   Resp 15   Ht 5\' 11"  (1.803 m)   Wt 64.6 kg   SpO2 100%   BMI 19.86 kg/m   General - NAD, Left sided facial droop, CALM AND QUIET HEENT- PERRLA Skin - Left anterior shoulder abrasion and bilateral anterior knee abrasion CVS - Normal s1s2, systolic murmur Abdomen- soft, NT, BS+ Extremities - Left sided weakness, Power -3/5 in the LUE and 4/5 in the LLE  LINES/TUBES: PIV  METAL IMPLANT/HARDWARE: Rt prosthetic shoulder and Strenotomy wires   Pertinent Microbiology Results for orders placed or performed during the hospital encounter of 03/05/20  Culture, blood (Routine X 2) w Reflex to ID Panel     Status: None (Preliminary result)   Collection Time: 03/07/20 12:51 PM   Specimen: BLOOD  Result Value Ref Range Status   Specimen Description BLOOD RIGHT ANTECUBITAL  Final   Special Requests   Final    BOTTLES DRAWN AEROBIC AND ANAEROBIC Blood Culture adequate volume   Culture   Final    NO GROWTH 3 DAYS Performed at [facility], [address], [place], [place]    Report Status PENDING  Incomplete  Culture, blood (Routine X 2)  w Reflex to ID Panel     Status: None (Preliminary result)   Collection Time: 03/07/20 12:51 PM   Specimen: BLOOD LEFT HAND  Result Value Ref Range Status   Specimen Description BLOOD LEFT HAND  Final   Special Requests   Final    BOTTLES DRAWN AEROBIC AND ANAEROBIC Blood Culture adequate volume   Culture   Final    NO GROWTH 3 DAYS Performed at [facility], [address] [place], [place] [address]    Report Status PENDING   Incomplete    Pertinent Lab. CBC Latest Ref Rng & Units 03/06/2020 03/05/2020 03/05/2020  WBC 4.0 - 10.5 K/uL 9.8 8.9 8.9  Hemoglobin 13.0 - 17.0 g/dL 11.4(L) 11.2(L) 11.9(L)  Hematocrit 39 - 52 % 34.5(L) 34.6(L) 35.7(L)  Platelets 150 - 400 K/uL 233 201 216   CMP Latest Ref Rng & Units 03/06/2020 03/05/2020 03/05/2020  Glucose 70 - 99 mg/dL 056(P) 794(I) 77  BUN 8 - 23 mg/dL 19 20 20   Creatinine 0.61 - 1.24 mg/dL ) 0.16(P) 5.37(S)  Sodium 135 - 145 mmol/L 138 142 142  Potassium 3.5 - 5.1 mmol/L 4.0 3.8 3.8  Chloride 98 - 111 mmol/L 105 108 109  CO2 22 - 32 mmol/L 22 23 21(L)  Calcium 8.9 - 10.3 mg/dL 8.27(M) 7.8(M) 7.5(Q)  Total Protein 6.5 - 8.1 g/dL 5.3(L) 5.8(L) -  Total Bilirubin 0.3 - 1.2 mg/dL 0.5 0.7 -  Alkaline Phos 38 - 126 U/L 69 79 -  AST 15 - 41 U/L 36 41 -  ALT 0 - 44 U/L 32 31 -    Pertinent Imaging today Plain films and CT images have been personally visualized and interpreted; radiology reports have been reviewed. Decision making incorporated into the Impression / Recommendations.  TTE 03/01/20 1. Left ventricular ejection fraction, by estimation, is approxiamtely 50%. 2. There is an elongated echodensity at the inferior apex of the left ventricle measuring approximately 1 x 2cm at largest dimentions, most consistent with mural thrombus. Does not appear freely mobile. There is hypokinesis to akinesis in this myocardial segment distribution. 3. Right ventricular systolic function is normal. The right ventricular size is normal.   TEE 03/10/20  1. No vegetation was identified. No thrombus was seen in the left ventricle but rather very prominent trabeculations. 2. Left ventricular ejection fraction, by estimation, is 45 to 50%. The left ventricle has mildly decreased function. The left ventricle demonstrates regional wall motion abnormalities (see scoring diagram/findings for description). 3. Right ventricular systolic function is normal. The right  ventricular size is normal. 4. No left atrial/left atrial appendage thrombus was detected. 5. The mitral valve is normal in structure. Mild mitral valve regurgitation. No evidence of mitral stenosis. 6. The aortic valve is normal in structure. Aortic valve regurgitation is not visualized. No aortic stenosis is present. 7. The inferior vena cava is normal in size with with greater than 50%  respiratory variability, suggesting right atrial pressure of 3 mmHg.

## 2020-03-10 NOTE — Progress Notes (Deleted)
  Echocardiogram 2D Echocardiogram has been performed.  [name] 03/10/2020, 9:06 AM

## 2020-03-10 NOTE — Progress Notes (Signed)
Occupational Therapy Note  Patient Details  Name: [name] MRN: [id number] Date of Birth: 06-01-1940  Today's Date: 03/10/2020   Pt missed 60 mins OT secondary to being gone for a procedure.    Kerby Borner,[place]ix OTR/L 03/10/2020, 9:50 AM

## 2020-03-10 NOTE — Progress Notes (Signed)
Physical Therapy Session Note  Patient Details  Name: [name] MRN: [id number] Date of Birth: Feb 16, 1941  Today's Date: 03/10/2020 PT Missed Time: 75 Minutes Missed Time Reason: Patient fatigue;Patient unwilling to participate  Short Term Goals: Week 1:  PT Short Term Goal 1 (Week 1): Patient will be able to achieve supine > sit with CGA consistently PT Short Term Goal 2 (Week 1): Patient will transfer bed<> wc with MinA x1 at least 50% of the time PT Short Term Goal 3 (Week 1): Patient will ambulate >59ft with MinA x2 with LRAD PT Short Term Goal 4 (Week 1): Patient will complete sit <> stand with no more than ModA x1 and LRAD  Skilled Therapeutic Interventions/Progress Updates:    Patient received supine in bed, difficult to rouse. PT able to wake patient up with gentle sternal rub, but he was unable to keep his eyes open long enough to complete sentence to respond to PT. Patient unable to participate in meaningful therapy at this time.   Therapy Documentation Precautions:  Precautions Precautions: Fall Precaution Comments: L hemi, L inattention Restrictions Weight Bearing Restrictions: No    Therapy/Group: Individual Therapy  Elizebeth Koller, PT, DPT, CBIS  03/10/2020, 1:18 PM

## 2020-03-10 NOTE — Transfer of Care (Signed)
Immediate Anesthesia Transfer of Care Note  Patient: KULE GASCOIGNE  Procedure(s) Performed: TRANSESOPHAGEAL ECHOCARDIOGRAM (TEE) (N/A )  Patient Location: Endoscopy Unit  Anesthesia Type:MAC  Level of Consciousness: awake and drowsy  Airway & Oxygen Therapy: Patient Spontanous Breathing and Patient connected to nasal cannula oxygen  Post-op Assessment: Report given to RN and Post -op Vital signs reviewed and stable  Post vital signs: Reviewed and stable  Last Vitals:  Vitals Value Taken Time  BP 110/57 03/10/20 0902  Temp    Pulse 70 03/10/20 0902  Resp 22 03/10/20 0903  SpO2    Vitals shown include unvalidated device data.  Last Pain:  Vitals:   03/10/20 0745  TempSrc: Oral  PainSc: 0-No pain         Complications: No complications documented.

## 2020-03-10 NOTE — Progress Notes (Signed)
  Echocardiogram Echocardiogram Transesophageal has been performed.  Delcie Roch 03/10/2020, 12:08 PM

## 2020-03-10 NOTE — Progress Notes (Signed)
Speech Language Pathology Daily Session Note  Patient Details  Name: [name] MRN: [id number] Date of Birth: 03-29-1941  Today's Date: 03/11/2020 SLP Individual Time: 1300-1355 SLP Individual Time Calculation (min): 55 min  Short Term Goals: Week 1: SLP Short Term Goal 1 (Week 1): Pt will consume current diet with efficient mastication and oral clearance and minimal overt s/sx aspiration with Mod A cueing to reduce oral holding and clear left pocketing. SLP Short Term Goal 2 (Week 1): Pt will consume therapeutic trials of thin H2O with minimal overt s/sx aspiration across 3 sessions to demonstrate readiness for repeat MBSS. SLP Short Term Goal 3 (Week 1): Pt will sustain attention to tasks and functional topics of conversation for 5-7 minute intervals with Min A cues for redirection. SLP Short Term Goal 4 (Week 1): Pt will demonstrate ability to problem solve functional familiar situations with Mod A verbal/visual cues. SLP Short Term Goal 5 (Week 1): Pt will recall new and/or daily information related to therapies with Mod A cues for use of aids or strategies. SLP Short Term Goal 6 (Week 1): Pt will utilize compensatory strategies for speech intelligibility (increased vocal intensity and overarticulation) at the sentence level with Mod A cues.  Skilled Therapeutic Interventions: Pt was seen for skilled ST targeting dysphagia and cognitive goals. SLP facilitated session with upgraded trials of thin H2O following oral care. He exhibited subtle throat clear or wet vocal quality in 50% of ice and thin H2O tsp presentations. Immediate coughing noted after 3/3 self fed cup sips. Pt observed to still be orally holding liquid bolus, which, per last MBSS results, suspect may have lead to a delayed swallow response. Due to pt's cognition, he has limited ability to follow commands or carryover of compensatory strategies and cues. Recommend continue current diet; pt is not yet demonstrating readiness for  repeat instrumental testing. SLP further facilitated session with Mod A verbal cuing for sequencing and reducing impulsivity during transfers to toilet and chair via stedy. He also required Moderate verbal and visual cues for sustained attention to tasks throughout session and length of time he could sustain attention varied drastically (anywhere from 10 seconds to 2-3 minutes). Mod A required for awareness of errors and recall during a basic card color sorting task (2 colors total). He sequenced cards numbered 1-10 with only supervision. Max A multimodal cueing required for problem solving and recall of functional use of call bell for TV function VS requesting help. Visual aid for call bell left posted on wall in front of pt to aid in recall/carryover. Pt left sitting in tilt in space chair with seatbelt alarm set, telesitter active, needs within reach. Continue per current plan of care.          Pain Pain Assessment Pain Scale: 0-10 Pain Score: 0-No pain  Therapy/Group: Individual Therapy  [name] 03/11/2020, 1:58 PM

## 2020-03-10 NOTE — Anesthesia Postprocedure Evaluation (Signed)
Anesthesia Post Note  Patient: JARREL KNOKE  Procedure(s) Performed: TRANSESOPHAGEAL ECHOCARDIOGRAM (TEE) (N/A )     Patient location during evaluation: PACU Anesthesia Type: MAC Level of consciousness: awake and alert Pain management: pain level controlled Vital Signs Assessment: post-procedure vital signs reviewed and stable Respiratory status: spontaneous breathing, nonlabored ventilation and respiratory function stable Cardiovascular status: blood pressure returned to baseline and stable Postop Assessment: no apparent nausea or vomiting Anesthetic complications: no   No complications documented.  Last Vitals:  Vitals:   03/10/20 0902 03/10/20 0909  BP: (!) 110/57 118/64  Pulse: 70   Resp: 15 15  Temp:  36.5 C  SpO2:  100%    Last Pain:  Vitals:   03/10/20 0909  TempSrc: Temporal  PainSc:                  Pervis Hocking

## 2020-03-10 NOTE — Interval H&P Note (Signed)
History and Physical Interval Note:  03/10/2020 8:09 AM  [name]  has presented today for surgery, with the diagnosis of BACTEREMIA.  The various methods of treatment have been discussed with the patient and family. After consideration of risks, benefits and other options for treatment, the patient has consented to  Procedure(s): TRANSESOPHAGEAL ECHOCARDIOGRAM (TEE) (N/A) as a surgical intervention.  The patient's history has been reviewed, patient examined, no change in status, stable for surgery.  I have reviewed the patient's chart and labs.  Questions were answered to the patient's satisfaction.     Tobias Alexander

## 2020-03-10 NOTE — CV Procedure (Signed)
     Transesophageal Echocardiogram Note  KREG EARHART [id number] [birth year]  Procedure: Transesophageal Echocardiogram Indications: Bacteremia - MRSA  Procedure Details Consent: Obtained Time Out: Verified patient identification, verified procedure, site/side was marked, verified correct patient position, special equipment/implants available, Radiology Safety Procedures followed,  medications/allergies/relevent history reviewed, required imaging and test results available.  Performed  Medications: During this procedure the patient is administered a total of Propofol 168 mg to achieve and maintain deep sedation administered by anesthesia staff.  The patient's heart rate, blood pressure, and oxygen saturation are monitored continuously during the procedure. The period of conscious sedation is 35 minutes, of which I was present face-to-face 100% of this time.  1. No vegetation was identified. No thrombus was seen in the left  ventricle but rather very prominent trabeculations.  2. Left ventricular ejection fraction, by estimation, is 45 to 50%. The  left ventricle has mildly decreased function. The left ventricle  demonstrates regional wall motion abnormalities (see scoring  diagram/findings for description).  3. Right ventricular systolic function is normal. The right ventricular  size is normal.  4. No left atrial/left atrial appendage thrombus was detected.  5. The mitral valve is normal in structure. Mild mitral valve  regurgitation. No evidence of mitral stenosis.  6. The aortic valve is normal in structure. Aortic valve regurgitation is  not visualized. No aortic stenosis is present.  7. The inferior vena cava is normal in size with greater than 50%  respiratory variability, suggesting right atrial pressure of 3 mmHg.   Complications: No apparent complications Patient did tolerate procedure well.  Tobias Alexander, MD, [facility] 03/10/2020, 9:04 AM

## 2020-03-10 NOTE — Progress Notes (Signed)
Speech Language Pathology Daily Session Note  Patient Details  Name: [name] MRN: [id number] Date of Birth: Apr 29, 1940  Today's Date: 03/10/2020 SLP Individual Time: 1102-1159 SLP Individual Time Calculation (min): 57 min  Short Term Goals: Week 1: SLP Short Term Goal 1 (Week 1): Pt will consume current diet with efficient mastication and oral clearance and minimal overt s/sx aspiration with Mod A cueing to reduce oral holding and clear left pocketing. SLP Short Term Goal 2 (Week 1): Pt will consume therapeutic trials of thin H2O with minimal overt s/sx aspiration across 3 sessions to demonstrate readiness for repeat MBSS. SLP Short Term Goal 3 (Week 1): Pt will sustain attention to tasks and functional topics of conversation for 5-7 minute intervals with Min A cues for redirection. SLP Short Term Goal 4 (Week 1): Pt will demonstrate ability to problem solve functional familiar situations with Mod A verbal/visual cues. SLP Short Term Goal 5 (Week 1): Pt will recall new and/or daily information related to therapies with Mod A cues for use of aids or strategies. SLP Short Term Goal 6 (Week 1): Pt will utilize compensatory strategies for speech intelligibility (increased vocal intensity and overarticulation) at the sentence level with Mod A cues.  Skilled Therapeutic Interventions: Pt was seen for skilled ST targeting dysphagia and cognitive-linguistic goals. Upon arrival pt was requesting ice cream; Per RN and NT pt was cleared by MD to begin POs again (was NPO for procedure earlier). Therefore, SLP provided pt with a magic cup and nectar orange juice snack. Pt consumed both textures without overt s/sx aspiration, trace left anterior loss noted. Although manipulation of boluses is still weak/generally poor and left buccal pocketing and oral holding noted, he eventually cleared oral cavity with Moderate cueing from SLP. Recommend continue current diet. Pt was better oriented today in comparison to  this SLP's last visit. Today he was oriented to self, place, situation, and month/year but not state. SLP further facilitated session with partial administration of SLUMs evaluation (as he became increasingly fatigued throughout session and unable to complete fully). He exhibited difficulty with thought organization, planning and problem solving during clock drawing, and short term recall (although storage of words was [facility], retrieval deficit evident). His speech was slightly more intelligible today and less language of coufusion, although still requires cues for maintaining appropriate topics of conversation and implementation of increased vocal intensity. He also required overall Mod A verbal and visual cueing for sustained attention to tasks when fully alert. Pt left laying in bed with alarm set and needs within reach, telesitter active and Lab tech arriving for bloodwork. Continue per current plan of care.          Pain Pain Assessment Pain Scale: Faces Faces Pain Scale: No hurt  Therapy/Group: Individual Therapy  [name] 03/10/2020, 12:04 PM

## 2020-03-10 NOTE — Anesthesia Procedure Notes (Addendum)
Procedure Name: MAC Date/Time: 03/10/2020 8:30 AM Performed by: Harden Mo, CRNA Pre-anesthesia Checklist: Patient identified, Emergency Drugs available, Suction available and Patient being monitored Patient Re-evaluated:Patient Re-evaluated prior to induction Oxygen Delivery Method: Nasal cannula Preoxygenation: Pre-oxygenation with 100% oxygen Induction Type: IV induction Placement Confirmation: positive ETCO2 and breath sounds checked- equal and bilateral Dental Injury: Teeth and Oropharynx as per pre-operative assessment

## 2020-03-11 ENCOUNTER — Telehealth: Payer: Self-pay | Admitting: Physical Medicine & Rehabilitation

## 2020-03-11 ENCOUNTER — Inpatient Hospital Stay (HOSPITAL_COMMUNITY): Payer: Medicare PPO | Admitting: Occupational Therapy

## 2020-03-11 ENCOUNTER — Inpatient Hospital Stay (HOSPITAL_COMMUNITY): Payer: Medicare PPO

## 2020-03-11 ENCOUNTER — Inpatient Hospital Stay (HOSPITAL_COMMUNITY): Payer: Medicare PPO | Admitting: Speech Pathology

## 2020-03-11 LAB — GLUCOSE, CAPILLARY
Glucose-Capillary: 110 mg/dL — ABNORMAL HIGH (ref 70–99)
Glucose-Capillary: 186 mg/dL — ABNORMAL HIGH (ref 70–99)
Glucose-Capillary: 209 mg/dL — ABNORMAL HIGH (ref 70–99)
Glucose-Capillary: 179 mg/dL — ABNORMAL HIGH (ref 70–99)

## 2020-03-11 MED ORDER — ATORVASTATIN CALCIUM 80 MG PO TABS
80.0000 mg | ORAL_TABLET | Freq: Every day | ORAL | Status: DC
  Administered 2020-03-11 – 2020-04-14 (×35): 80 mg via ORAL
  Filled 2020-03-11 (×35): qty 1

## 2020-03-11 NOTE — Telephone Encounter (Signed)
[name] returned your call. Pleae call her at [phone]

## 2020-03-11 NOTE — Progress Notes (Signed)
Occupational Therapy Session Note  Patient Details  Name: [name] MRN: [id number] Date of Birth: 08-15-1940  Today's Date: 03/11/2020 OT Individual Time: 1007-1100 OT Individual Time Calculation (min): 53 min    Short Term Goals: Week 1:  OT Short Term Goal 1 (Week 1): Pt will complete UB dressing with min assist for pullover shirt. OT Short Term Goal 2 (Week 1): Pt will complete LB bathing with mod assist sit to stand for two consecutive sessions. OT Short Term Goal 3 (Week 1): Pt will complete LB dressing with mod assist sit to stand. OT Short Term Goal 4 (Week 1): Pt will use the LUE with min assist for washing the RUE.  Skilled Therapeutic Interventions/Progress Updates:    Session 1: (4098-1191) Pt in wheelchair to start session.  He was not oriented to month or day of the week but he was able to state that he had suffered a stroke and was at "[facility]".  Therapist looked at vision to start with pt demonstrating ability to read the paper accurately with use of his readers, both small and fine print.  Without the glasses he was able to read the clock and words on the wall at distance, but did have some trouble with the phone number written on the wall.  Next, he was taken down to the therapy gym where he completed stand pivot transfer to the mat with mod assist.  Increased pushing to the left side was noted.  Therapist started with gentle AAROM for the digits, elbow, and shoulder to approximately 90 degrees.  Pt with some increased pain in the left shoulder noted above 90 degrees.  Had him work on gently weightbearing through the LUE placed on the mat while reaching for some foam blocks with the LUE.  Increased impulsivity noted when reaching for items as he would try to move items closer to reach them easier without completion of weightshift that therapist was after.  Gave him specific directions to pick up a certain color block and he was able to complete this.  Also had him work on trying to  pick up 2" X 3" foam blocks with the LUE as well and place in container at knee level.  He needed max facilitation secondary to motor planning deficits.  Returned to the wheelchair abruptly with mod assist as pt stated he needed to use the restroom.  He completed transfer to the [facility] over the toilet with mod assist.  Mod assist was needed for clothing management as well.  While sitting and attempting to use the bathroom, he continually kept taking more and more toilet paper off of the roll with the RUE.  Therapist had to re-direct him X 3 that he had enough paper as well as shutting the slide over to keep him from getting more.  He was not successful in completion of a BM.  Mod assist to pull clothing up with transfer back to the tilt in space wheelchair.  Pt was left tilted back slightly with the call button and phone in reach as well as the safety belt in place.    Session 2: ([id number])  Pt in wheelchair attempting to reach down to the floor to pick up his glasses when therapist entered.  Telesitter communicating with him to not stand up as well as therapist re-directing him.  Once his glasses were retrieved from the floor, he was able to state that he needed to go to the bathroom.  Therapist provided mod facilitation for ambulation  to the bathroom.  Increased knee flexion in the LLE as well as increased pushing to the left noted.  He was able to manage him clothing at [facility] assist as well. While sitting he was asked if he recalled the month and the day of the week.  He was incorrect on the month stating "December" and shook his head when asked about the day of the week.  Therapist provided the correct day of the week and month to him.  He was able to recall them approximately 5 mins later when asked.  He was unsuccessful with attempt to have a bowel movement.  He needed mod assist for completion of clothing management in standing with max assist for ambulation out to the sink for washing his hands.  Mod assist  for sequencing washing hands and drying them off.  He continued to perseverate on getting more paper towels to dry them off and had to be given a physical cue to keep from reaching for more.  Had him try to ambulate over to place them in the trash can, however he attempted to throw them in from a distance, missing the can.  When trying to walk over to pick them up he became distracted, trying to reach for the [facility] that was over at the foot of the bed as well as picking up a wooden puzzle that was on his dresser.  He needed max instructional cueing for re-direction and he was assisted back to his chair.  When asked what the puzzle was he was holding, he stated "Candy" and attempted to pull it apart.  Therapist re-directed him that it was not candy and had to take it from his hand, as he continued to try and manipulate it.  Finished session with transfer to the bed at mod assist stand pivot.  Supine to sit at min assist level with call button and phone in reach and safety alarm in place.    Therapy Documentation Precautions:  Precautions Precautions: Fall Precaution Comments: L hemi, L inattention Restrictions Weight Bearing Restrictions: No  Pain: Pain Assessment Pain Scale: Faces Pain Score: 0-No pain ADL: See Care Tool Section for some details of mobility and selfcare  Therapy/Group: Individual Therapy  Letti Towell,Demarques OTR/L 03/11/2020, 12:28 PM

## 2020-03-11 NOTE — Progress Notes (Signed)
[place] PHYSICAL MEDICINE & REHABILITATION PROGRESS NOTE   Subjective/Complaints:  Remains dysarthric, decreased orientation.  Patient is working with therapy as well as nurses aide this morning.  Finishing breakfast.  Nursing aide reports incontinence Reviewed infectious disease note  ROS: Dysarthria as well as cognitive deficits  Objective:   ECHO TEE  Result Date: 03/10/2020    TRANSESOPHOGEAL ECHO REPORT   Patient Name:   [name] Date of Exam: 03/10/2020 Medical Rec #:  [id number]     Height:       71.0 in Accession #:    [id number]    Weight:       142.4 lb Date of Birth:  February 26, 1941      BSA:          1.825 m Patient Age:    79 years      BP:           158/82 mmHg Patient Gender: M             HR:           92 bpm. Exam Location:  Inpatient Procedure: Transesophageal Echo Indications:    Bacteremia  History:        Patient has prior history of Echocardiogram examinations. CHF;                 Previous Myocardial Infarction and CAD.  Sonographer:    Delcie Roch Referring Phys: [id number] [facility] PROCEDURE: The transesophogeal probe was passed without difficulty through the esophogus of the patient. Sedation performed by different physician. Image quality was good. The patient's vital signs; including heart rate, blood pressure, and oxygen saturation; remained stable throughout the procedure. The patient developed no complications during the procedure. IMPRESSIONS  1. No vegetation was identified. No thrombus was seen in the left ventricle but rather very prominent trabeculations.  2. Left ventricular ejection fraction, by estimation, is 45 to 50%. The left ventricle has mildly decreased function. The left ventricle demonstrates regional wall motion abnormalities (see scoring diagram/findings for description).  3. Right ventricular systolic function is normal. The right ventricular size is normal.  4. No left atrial/left atrial appendage thrombus was detected.  5. The mitral  valve is normal in structure. Mild mitral valve regurgitation. No evidence of mitral stenosis.  6. The aortic valve is normal in structure. Aortic valve regurgitation is not visualized. No aortic stenosis is present.  7. The inferior vena cava is normal in size with greater than 50% respiratory variability, suggesting right atrial pressure of 3 mmHg. Conclusion(s)/Recommendation(s): FINDINGS  Left Ventricle: Left ventricular ejection fraction, by estimation, is 45 to 50%. The left ventricle has mildly decreased function. The left ventricle demonstrates regional wall motion abnormalities. The left ventricular internal cavity size was normal in size. There is no left ventricular hypertrophy.  LV Wall Scoring: The mid and distal lateral wall, entire inferior wall, and posterior wall are hypokinetic. Right Ventricle: The right ventricular size is normal. No increase in right ventricular wall thickness. Right ventricular systolic function is normal. Left Atrium: Left atrial size was normal in size. No left atrial/left atrial appendage thrombus was detected. Right Atrium: Right atrial size was normal in size. Pericardium: There is no evidence of pericardial effusion. Mitral Valve: The mitral valve is normal in structure. Mild mitral valve regurgitation. No evidence of mitral valve stenosis. There is no evidence of mitral valve vegetation. Tricuspid Valve: The tricuspid valve is normal in structure. Tricuspid valve regurgitation is not demonstrated. No evidence  of tricuspid stenosis. There is no evidence of tricuspid valve vegetation. Aortic Valve: The aortic valve is normal in structure. Aortic valve regurgitation is not visualized. No aortic stenosis is present. There is no evidence of aortic valve vegetation. Pulmonic Valve: The pulmonic valve was normal in structure. Pulmonic valve regurgitation is not visualized. No evidence of pulmonic stenosis. Aorta: The aortic root is normal in size and structure. Venous: The  inferior vena cava is normal in size with greater than 50% respiratory variability, suggesting right atrial pressure of 3 mmHg. IAS/Shunts: No atrial level shunt detected by color flow Doppler. Tobias Alexander MD Electronically signed by Tobias Alexander MD Signature Date/Time: 03/10/2020/9:02:00 AM    Final    Recent Labs    03/10/20 1407  WBC 8.2  HGB 10.4*  HCT 31.8*  PLT 256   Recent Labs    03/10/20 1407  NA 136  K 5.1  CL 102  CO2 26  GLUCOSE 242*  BUN 17  CREATININE 1.10  CALCIUM 8.5*    Intake/Output Summary (Last 24 hours) at 03/11/2020 0826 Last data filed at 03/10/2020 1746 Gross per 24 hour  Intake 820 ml  Output --  Net 820 ml        Physical Exam: Vital Signs Blood pressure (!) 156/78, pulse (!) 53, temperature 98.2 F (36.8 C), temperature source Oral, resp. rate 18, height 5\' 11"  (1.803 m), weight 66.4 kg, SpO2 100 %.   General: No acute distress Mood and affect are appropriate Heart: Regular rate and rhythm no rubs murmurs or extra sounds Lungs: Clear to auscultation, breathing unlabored, no rales or wheezes Abdomen: Positive bowel sounds, soft nontender to palpation, nondistended Extremities: No clubbing, cyanosis, or edema Skin: No evidence of breakdown, no evidence of rash  Neurologic: Cranial nerves II through XII intact, motor strength is 5/5 in RIght and 4/5 Left deltoid, bicep, tricep, grip, hip flexor, knee extensors, ankle dorsiflexor and plantar flexor  Musculoskeletal: No pain with range of motion, no joint swelling      Assessment/Plan: 1. Functional deficits which require 3+ hours per day of interdisciplinary therapy in a comprehensive inpatient rehab setting.  Physiatrist is providing close team supervision and 24 hour management of active medical problems listed below.  Physiatrist and rehab team continue to assess barriers to discharge/monitor patient progress toward functional and medical goals  Care Tool:  Bathing     Body parts bathed by patient: Chest, Abdomen, Right upper leg, Left upper leg, Face, Right arm, Front perineal area   Body parts bathed by helper: Buttocks, Left lower leg, Right lower leg, Left arm     Bathing assist Assist Level: Moderate Assistance - Patient 50 - 74%     Upper Body Dressing/Undressing Upper body dressing   What is the patient wearing?: Pull over shirt    Upper body assist Assist Level: Moderate Assistance - Patient 50 - 74%    Lower Body Dressing/Undressing Lower body dressing      What is the patient wearing?: Pants, Incontinence brief     Lower body assist Assist for lower body dressing: Maximal Assistance - Patient 25 - 49%     Toileting Toileting    Toileting assist Assist for toileting: Dependent - Patient 0%     Transfers Chair/bed transfer  Transfers assist     Chair/bed transfer assist level: Moderate Assistance - Patient 50 - 74%     Locomotion Ambulation   Ambulation assist      Assist level: 2 helpers (+2  mod  A) Assistive device: Other (comment) (3 Musketeer) Max distance: 49ft   Walk 10 feet activity   Assist     Assist level: 2 helpers (+2 mod  A) Assistive device: Other (comment) (3 Musketeer)   Walk 50 feet activity   Assist Walk 50 feet with 2 turns activity did not occur: Safety/medical concerns (due to patient fatigue/weakness)  Assist level: 2 helpers (+2 mod  A) Assistive device: Other (comment) (3 Musketeer)    Walk 150 feet activity   Assist Walk 150 feet activity did not occur: Safety/medical concerns (due to patient fatigue/weakness)         Walk 10 feet on uneven surface  activity   Assist Walk 10 feet on uneven surfaces activity did not occur: Safety/medical concerns (due to patient fatigue/weakness)         Wheelchair     Assist Will patient use wheelchair at discharge?: Yes Type of Wheelchair: Manual    Wheelchair assist level: Dependent - Patient 0% (TIS wc) Max  wheelchair distance: 150    Wheelchair 50 feet with 2 turns activity    Assist        Assist Level: Dependent - Patient 0%   Wheelchair 150 feet activity     Assist      Assist Level: Dependent - Patient 0%   Blood pressure (!) 156/78, pulse (!) 53, temperature 98.2 F (36.8 C), temperature source Oral, resp. rate 18, height 5\' 11"  (1.803 m), weight 66.4 kg, SpO2 100 %.    Medical Problem List and Plan: 1. 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               -patient may shower             -ELOS/Goals: 22-27 days/min a             Continue CIR, TEam conf in am  2.  Antithrombotics: -DVT/anticoagulation:  Pharmaceutical: Other (comment)--Eliquis.              -antiplatelet therapy: N/A--has been off ASA/Brilinta.  3. Chronic HA/Cervicalgia/Pain Management: Was on flexeril prn PTA. Will monitor N/A. Will order kpad and cervical spine XR given new neck pain and TTP on cervical spine. 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             Monitor with increased exertion 4. Mood: LCSW to follow for evaluation and support.              -antipsychotic agents: Monitor for now.  5. Neuropsych: This patient is not capable of making decisions on his own behalf. 6. Skin/Wound Care: Routine pressure relief measures. Protein supplement due to evidence of malnutrition.  7. Fluids/Electrolytes/Nutrition: Monitor I/Os.              CMP 11/11 is stable.  8. Gastroenteritis: Had multiple episodes of N/V on 11/09 with 4 incontinent stools and low grade fever. Monitor for GI virus. 9. HTN: Monitor BP tid--educate on importance of compliance. SBP goal 130-150  11/13- BP controlled- con't regimen              Monitor with  increased exertion. 10. ICM/Heart failure with persistent apical thrombus: Monitor for signs and symptoms of fluid overload. Daily weights. Continue Eliquis.  11. A fib: Monitor HR tid--continue Lanoxin  and Eliquis.              Monitor with increased activity 12. Acute on chronic renal failure: Likely due to gastroenteritis. SCr 1.55-->1.3-->1.53.   Will recheck lytes in am.              Cr trending down on 11/11  11/15- Cr down to 1.1- con't to monitor  13. Electrolytes abnormalities: Resolved post supplementation--recheck magnesium/potassium levels in am.  14. Anemia: Recheck iron levels.                    CBC ordered for tomorrow a.m. 15. T2DM: Hgb A1C-6.8.  Will monitor BS ac/hs. Discontinue metformin due to CKD. Add CM restrictions to diet.              Monitor with increased mobility CBG (last 3)  Recent Labs    03/10/20 1655 03/10/20 2058 03/11/20 0611  GLUCAP 168* 141* 110*  fair control  SSI  16. Aspiration Pneumonitis: no signs of PNA, already on IV Cefazolin, d/c augmentin, neg CXR, nl WBCs 17. Dysphagia: Continue CIR SLP 18. MSSA bacteremia  TEE showed no valve vegetations, no other sig structural abnl in report cardiology to f/u with recs  LOS: 6 days A FACE TO FACE EVALUATION WAS PERFORMED  Erick Colace 03/11/2020, 8:26 AM

## 2020-03-11 NOTE — Progress Notes (Signed)
Physical Therapy Session Note  Patient Details  Name: [name] MRN: [id number] Date of Birth: Nov 03, 1940  Today's Date: 03/11/2020 PT Individual Time: 0830-0930 PT Individual Time Calculation (min): 60 min   Short Term Goals: Week 1:  PT Short Term Goal 1 (Week 1): Patient will be able to achieve supine > sit with CGA consistently PT Short Term Goal 2 (Week 1): Patient will transfer bed<> wc with MinA x1 at least 50% of the time PT Short Term Goal 3 (Week 1): Patient will ambulate >4ft with MinA x2 with LRAD PT Short Term Goal 4 (Week 1): Patient will complete sit <> stand with no more than ModA x1 and LRAD  Skilled Therapeutic Interventions/Progress Updates:    Patient received sitting up in bed, eating breakfast with NT present, agreeable to PT. He appears much more alert and awake today compared to yesterday. He does still require verbal cues and intervention for slower pace of eating and encouragement to swallow bolus of food in mouth prior to consuming more food. He does become frustration when PT intervenes preventing patient from consuming more food until he swallows what's in his mouth. MaxA for patient to don pants supine in bed, ModA to come to sitting edge of bed to don shirt with ModA + CGA for postural stability. Patient able to transfer to wc via stand pivot with ModA. PT propelling patient in wc to therapy gym for time management. He was able to transfer to therapy mat via stand pivot with ModA. He complete sit <>stand x4 with mirror for visual feedback and ModA/MaxA. Despite use of mirror, patient maintains posterior L lean and weight distribution. He is responsive to verbal cues to adjust this, but has difficulty maintaining midline posture for longer periods of time. Patient with persistent L lateral lean when completing marches in standing. Demonstrating limited ability to shift weight L for improved R foot clearance. Patient completing seated reaching task matching playing  cards. ModA needed for accuracy of matching task+ intermittent cues to return to midline. Patient returning to chair via squat pivot and ModA. Remaining up in wc, seatbelt alarm on, call light within reach.   Therapy Documentation Precautions:  Precautions Precautions: Fall Precaution Comments: L hemi, L inattention Restrictions Weight Bearing Restrictions: No    Therapy/Group: Individual Therapy  Elizebeth Koller, PT, DPT, CBIS  03/11/2020, 7:35 AM

## 2020-03-12 ENCOUNTER — Inpatient Hospital Stay (HOSPITAL_COMMUNITY): Payer: Medicare PPO

## 2020-03-12 ENCOUNTER — Inpatient Hospital Stay (HOSPITAL_COMMUNITY): Payer: Medicare PPO | Admitting: Occupational Therapy

## 2020-03-12 ENCOUNTER — Inpatient Hospital Stay (HOSPITAL_COMMUNITY): Payer: Medicare PPO | Admitting: Speech Pathology

## 2020-03-12 LAB — CULTURE, BLOOD (ROUTINE X 2)
Culture: NO GROWTH
Culture: NO GROWTH
Special Requests: ADEQUATE
Special Requests: ADEQUATE

## 2020-03-12 LAB — GLUCOSE, CAPILLARY
Glucose-Capillary: 111 mg/dL — ABNORMAL HIGH (ref 70–99)
Glucose-Capillary: 252 mg/dL — ABNORMAL HIGH (ref 70–99)
Glucose-Capillary: 92 mg/dL (ref 70–99)
Glucose-Capillary: 290 mg/dL — ABNORMAL HIGH (ref 70–99)

## 2020-03-12 NOTE — Progress Notes (Signed)
Patient ID: [name], male   DOB: 1940-07-04, 79 y.o.   MRN: [id number] Team Conference Report to Patient/Family  Team Conference discussion was reviewed with the patient and caregiver, including goals, any changes in plan of care and target discharge date.  Patient and caregiver express understanding and are in agreement.  The patient has a target discharge date of 03/27/20.  Andria Rhein 03/12/2020, 1:18 PM

## 2020-03-12 NOTE — Progress Notes (Signed)
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  Ongoing  GOAL:   Patient will meet greater than or equal to 90% of their needs  Progressing  MONITOR:   PO intake, Supplement acceptance, Diet advancement, Labs, Weight trends, Skin  REASON FOR ASSESSMENT:   Consult Diet education  ASSESSMENT:   79 year old male with PMH of T2DM, HTN, CAD s/p CABG in 2020, ICM, cardioembolic CVA, COPD who was admitted on 02/28/20 after being found on the floor with left hemiparesis, facial droop, and multiple abrasions. 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 Hospital course further complicated by post-stroke dysphagia. Pt started on a dysphagia 1 diet with nectar-thick liquids. Admitted to CIR on 11/10.  11/15 - TEE with no vegetations  Noted d/c date of 03/27/20.  Overall, weight up 3 lbs since admission. Weight gain is favorable given severe malnutrition.  Attempted to speak with pt at bedside. Pt lying in bed with eyes closed and TV on. Pt shaking his leg back and forth but did not open eyes to RD touch or voice.  Will continue with current supplement regimen and adjust as diet is advanced. Pt eating 100% at most meals and averaging 85% over the last 8 recorded meals.  Meal Completion: 25-100% (averaging 85%)  Medications reviewed  and include: SSI, Juven BID, vitamin C 250 mg daily, IV abx  Labs reviewed. CBG's: 111-252 x 24 hours  Diet Order:   Diet Order            DIET - DYS 1 Room service appropriate? Yes; Fluid consistency: Nectar Thick  Diet effective now                 EDUCATION NEEDS:   Not appropriate for education at this time  Skin:  Skin Assessment: Skin Integrity Issues: Incisions: face Other: non-pressure wounds to bilateral knees, non-pressure wound to left shoulder  Last BM:  03/12/20 medium type 4  Height:   Ht Readings from Last 1 Encounters:  03/10/20 5\' 11"  (1.803 m)    Weight:   Wt Readings from Last 1 Encounters:  03/12/20 67.1 kg    BMI:  Body mass index is 20.63 kg/m.  Estimated Nutritional Needs:   Kcal:  1800-2000  Protein:  85-100 grams  Fluid:  1.8-2.0 L    03/14/20, MS, RD, LDN Inpatient Clinical Dietitian Please see [facility] for contact information.

## 2020-03-12 NOTE — Progress Notes (Signed)
Physical Therapy Session Note  Patient Details  Name: [name] MRN: [id number] Date of Birth: 07/11/1940  Today's Date: 03/12/2020 PT Individual Time: 0800-0820 PT Individual Time Calculation (min): 20 min  and Today's Date: 03/12/2020 PT Missed Time: 25 Minutes and 60 minutes  Missed Time Reason: Patient unwilling to participate;Increased agitation; patient ill   Short Term Goals: Week 1:  PT Short Term Goal 1 (Week 1): Patient will be able to achieve supine > sit with CGA consistently PT Short Term Goal 2 (Week 1): Patient will transfer bed<> wc with MinA x1 at least 50% of the time PT Short Term Goal 3 (Week 1): Patient will ambulate >55ft with MinA x2 with LRAD PT Short Term Goal 4 (Week 1): Patient will complete sit <> stand with no more than ModA x1 and LRAD  Skilled Therapeutic Interventions/Progress Updates:    Session 1: Patient received sitting up in bed, eating breakfast, but frequently falling asleep. NT at bedside. He denies pain when asked. PT finding patients L arm tucked underneath his body. Patient apparently unaware of this, and became frustrated when PT made patient aware of his arm placement. Patient repeatedly asking to be "left alone" raising his voice each time he requested this. PT attempting to redirect patient to participate in therapy. Patient requesting to don pants, supine in bed, but then remain in bed. PT unable to redirect patient toward meaningful therapy. He remained in bed, bed alarm on, call light within reach.    Session 2: PT sitting outside patient room charting prior to beginning of session when Telesitter was heard asking for safety check on patient. PT heard patient coughing from outside of room. Patient found to be restless in bed coughing, trying to get out of bed. PT able to have patient sit on edge of bed with MinA and ModA to maintain dynamic sitting balance edge of bed for improved cough mechanics. Patient repeatedly stating "I think I'm dying"  between coughs. He appeared to be coughing up saliva/spit into washcloth. No blood or food remnants present. Patient able to take small sip of juice while seated edge of bed, which seemed to minimize the coughing for a few minutes, until coughing picked back up again. Once coughing subsided, patient remaining sidelying in bed, head slightly elevated, moaning loudly. When asked if he had pain, patient reporting 9/10 pain in throat and neck. RN aware of patients report of pain and coughing. Patient unsafe to participate in therapy at this time given extent of coughing fit. Bed alarm on sensitive setting if patient moves toward edge of bed, call light within reach. RN aware of patients positioning.   Therapy Documentation Precautions:  Precautions Precautions: Fall Precaution Comments: L hemi, L inattention Restrictions Weight Bearing Restrictions: No    Therapy/Group: Individual Therapy  Elizebeth Koller, PT, DPT, CBIS  03/12/2020, 7:33 AM

## 2020-03-12 NOTE — Progress Notes (Signed)
[facility] PHYSICAL MEDICINE & REHABILITATION PROGRESS NOTE   Subjective/Complaints:  No issues overnight, sleepy this am, per RN has had several small BMs  Apraxia noted per OT   ROS: Dysarthria as well as cognitive deficits  Objective:   No results found. Recent Labs    03/10/20 1407  WBC 8.2  HGB 10.4*  HCT 31.8*  PLT 256   Recent Labs    03/10/20 1407  NA 136  K 5.1  CL 102  CO2 26  GLUCOSE 242*  BUN 17  CREATININE 1.10  CALCIUM 8.5*    Intake/Output Summary (Last 24 hours) at 03/12/2020 0924 Last data filed at 03/12/2020 0813 Gross per 24 hour  Intake 960 ml  Output --  Net 960 ml        Physical Exam: Vital Signs Blood pressure (!) 171/74, pulse (!) 51, temperature 98.2 F (36.8 C), resp. rate 18, height '5\' 11"'  (1.803 m), weight 67.1 kg, SpO2 100 %.   General: No acute distress Mood and affect are flat but otherwise appropriate Heart: Regular rate and rhythm no rubs murmurs or extra sounds Lungs: Clear to auscultation, breathing unlabored, no rales or wheezes Abdomen: Positive bowel sounds, soft nontender to palpation, nondistended Extremities: No clubbing, cyanosis, or edema Skin: No evidence of breakdown, no evidence of rash  Neurologic: Answers yes to most questions  motor strength is 5/5 in RIght and 4/5 Left deltoid, bicep, tricep, grip, hip flexor, knee extensors, ankle dorsiflexor and plantar flexor  Musculoskeletal: No pain with range of motion, no joint swelling      Assessment/Plan: 1. Functional deficits which require 3+ hours per day of interdisciplinary therapy in a comprehensive inpatient rehab setting.  Physiatrist is providing close team supervision and 24 hour management of active medical problems listed below.  Physiatrist and rehab team continue to assess barriers to discharge/monitor patient progress toward functional and medical goals  Care Tool:  Bathing    Body parts bathed by patient: Chest, Abdomen, Right  upper leg, Left upper leg, Face, Right arm, Front perineal area   Body parts bathed by helper: Buttocks, Left lower leg, Right lower leg, Left arm     Bathing assist Assist Level: Moderate Assistance - Patient 50 - 74%     Upper Body Dressing/Undressing Upper body dressing   What is the patient wearing?: Pull over shirt    Upper body assist Assist Level: Moderate Assistance - Patient 50 - 74%    Lower Body Dressing/Undressing Lower body dressing      What is the patient wearing?: Pants, Incontinence brief     Lower body assist Assist for lower body dressing: Maximal Assistance - Patient 25 - 49%     Toileting Toileting    Toileting assist Assist for toileting: Moderate Assistance - Patient 50 - 74%     Transfers Chair/bed transfer  Transfers assist     Chair/bed transfer assist level: Moderate Assistance - Patient 50 - 74%     Locomotion Ambulation   Ambulation assist      Assist level: Maximal Assistance - Patient 25 - 49% Assistive device: No Device Max distance: 12'   Walk 10 feet activity   Assist     Assist level: 2 helpers (+2 mod  A) Assistive device: Other (comment) (3 Musketeer)   Walk 50 feet activity   Assist Walk 50 feet with 2 turns activity did not occur: Safety/medical concerns (due to patient fatigue/weakness)  Assist level: 2 helpers (+2 mod  A) Assistive  device: Other (comment) (3 Musketeer)    Walk 150 feet activity   Assist Walk 150 feet activity did not occur: Safety/medical concerns (due to patient fatigue/weakness)         Walk 10 feet on uneven surface  activity   Assist Walk 10 feet on uneven surfaces activity did not occur: Safety/medical concerns (due to patient fatigue/weakness)         Wheelchair     Assist Will patient use wheelchair at discharge?: Yes Type of Wheelchair: Manual    Wheelchair assist level: Dependent - Patient 0% (TIS wc) Max wheelchair distance: 150    Wheelchair 50 feet  with 2 turns activity    Assist        Assist Level: Dependent - Patient 0%   Wheelchair 150 feet activity     Assist      Assist Level: Dependent - Patient 0%   Blood pressure (!) 171/74, pulse (!) 51, temperature 98.2 F (36.8 C), resp. rate 18, height '5\' 11"'  (1.803 m), weight 67.1 kg, SpO2 100 %.    Medical Problem List and Plan: 1. 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               -patient may shower             -ELOS/Goals: 22-27 days/min a             Continue CIR,  Team conference today please see physician documentation under team conference tab, met with team  to discuss problems,progress, and goals. Formulized individual treatment plan based on medical history, underlying problem and comorbidities.   2.  Antithrombotics: -DVT/anticoagulation:  Pharmaceutical: Other (comment)--Eliquis.              -antiplatelet therapy: N/A--has been off ASA/Brilinta.  3. Chronic HA/Cervicalgia/Pain Management: Was on flexeril prn PTA. Will monitor N/A. Will order kpad and cervical spine XR given new neck pain and TTP on cervical spine. 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             Monitor with increased exertion 4. Mood: LCSW to follow for evaluation and support.              -antipsychotic agents: Monitor for now.  5. Neuropsych: This patient is not capable of making decisions on his own behalf. 6. Skin/Wound Care: Routine pressure relief measures. Protein supplement due to evidence of malnutrition.  7. Fluids/Electrolytes/Nutrition: Monitor I/Os.              CMP 11/11 is stable.  8. Gastroenteritis: Had multiple episodes of N/V on 11/09 with 4 incontinent stools and low grade fever. Monitor for GI  virus. 9. HTN: Monitor BP tid--educate on importance of compliance. SBP goal 130-150   Vitals:   03/11/20 1943 03/12/20 0319  BP: (!) 126/56 (!) 171/74  Pulse: 68 (!) 51  Resp: 18   Temp: 98.2 F (36.8 C) 98.2 F (36.8 C)  SpO2: 100% 100%  labile BP, check ortho vitals 10. ICM/Heart failure with persistent apical thrombus: Monitor for signs and symptoms of fluid overload. Daily weights. Continue Eliquis.  11. A fib: Monitor HR tid--continue Lanoxin and Eliquis.              Monitor with increased activity 12. Acute on chronic renal failure: Likely  due to gastroenteritis. SCr 1.55-->1.3-->1.53.   Will recheck lytes in am.              Cr trending down on 11/11  11/15- Cr down to 1.1- con't to monitor  13. Electrolytes abnormalities: Resolved post supplementation--recheck magnesium/potassium levels in am.  14. Anemia: Recheck iron levels.                    CBC ordered for tomorrow a.m. 15. T2DM: Hgb A1C-6.8.  Will monitor BS ac/hs. Discontinue metformin due to CKD. Add CM restrictions to diet.              Monitor with increased mobility CBG (last 3)  Recent Labs    03/11/20 1650 03/11/20 2047 03/12/20 0602  GLUCAP 209* 179* 111*  fair control , some lability  SSI  16. Aspiration Pneumonitis: no signs of PNA, already on IV Cefazolin, d/c augmentin, neg CXR, nl WBCs 17. Dysphagia: Continue CIR SLP 18. MSSA bacteremia  TEE showed no valve vegetations, no other sig structural abnl in report cardiology to f/u with recs Per ID 19.  COgnitive deficits from CVA +/- encephalopathy from bacteremia, may need to add methylphenidate will discuss with team  LOS: 7 days A FACE TO FACE EVALUATION WAS PERFORMED  [name] 03/12/2020, 9:24 AM

## 2020-03-12 NOTE — Progress Notes (Signed)
Occupational Therapy Weekly Progress Note  Patient Details  Name: [name] MRN: [id number] Date of Birth: 1940/07/01  Beginning of progress report period: March 06, 2020 End of progress report period: March 12, 2020  Today's Date: 03/12/2020 OT Individual Time: 1245-8099 OT Individual Time Calculation (min): 72 min    Patient has met 1 of 4 short term goals.  Pt is making slower progress than originally expected toward goals.  He currently still demonstrates decreased awareness of his situation and deficits from the stroke.  He is not oriented to the month or the day of the week, but is oriented to place and that he had a stroke.  He demonstrates decreased sustained attention to bathing and dressing tasks, requiring max instructional cueing to sequence.  He is able to complete stand pivot transfers with mod assist to the wheelchair and to the toilet.  Mod to max assist is needed for functional mobility with hand held assist to the bathroom as well.  He demonstrates Brunnstrum stage III in the left arm and hand, requiring current max assist to integrate into functional tasks such as washing the left arm. He needs max instructional cueing to sustain attention to the arm and position it safely before and after transitional movements.  [name] continues to perseverate on reaching for items on the right side frequently.  While completing toileting tasks, he will perseverate on pulling paper off the roll and needs physical cueing as well as verbal cueing to stop.  Therapist also had to close the paper opening as well to keep him from trying to pull it off.  When completing mobility he will also try to reach for any item he can with the RUE as well.  Increased pushing to the left side is noted with LB dressing tasks when attempting to wash his feet or donn LB clothing.  Feel he will continue to need extensive CIR level rehab in preparation for discharge home.  Min assist level goals are set with  plan for pt to go to his daughter's house in [place].     Patient continues to demonstrate the following deficits: muscle weakness and muscle paralysis, impaired timing and sequencing, unbalanced muscle activation and decreased coordination, decreased midline orientation and decreased attention to left, decreased initiation, decreased attention, decreased awareness, decreased problem solving, decreased safety awareness, decreased memory and delayed processing and decreased sitting balance, decreased standing balance, decreased postural control, hemiplegia and decreased balance strategies and therefore will continue to benefit from skilled OT intervention to enhance overall performance with BADL and Reduce care partner burden.  Patient progressing toward long term goals..  Continue plan of care.  OT Short Term Goals Week 2:  OT Short Term Goal 1 (Week 2): Pt will complete LB dressing with mod assist sit to stand. OT Short Term Goal 2 (Week 2): Pt will use the LUE with mod assist for washing the RUE. OT Short Term Goal 3 (Week 2): Pt will complete UB dressing with min assist for pullover shirt for two sessions. OT Short Term Goal 4 (Week 2): Pt will complete toilet transfers with min assist using the RW for support. OT Short Term Goal 5 (Week 2): Pt will completed bathing tasks with no more than mod instructional cueing for initiation and completion of bathing tasks.  Skilled Therapeutic Interventions/Progress Updates:    Pt worked on bathing, dressing, and toileting during session.  Max instructional cueing needed to open his eyes and keep them open.  He needed max  assist for supine to sit this session secondary to decreased initiation of task.  He was then able to perform stand pivot transfer to the wheelchair with mod assist to work on bathing and dressing tasks at the sink.  Max instructional cueing for sequencing all bathing, including setup of supplies and putting soap on washcloth and in wash pan.   Pt initially emptied out wash pan that therapist had filled with water once he was over to the sink.  He was able to verbalize the need to go to the bathroom X2 during session with completion of ambulation to and from as well as all aspects of toileting with mod assist.  Max instructional cueing to refrain from attempting to remove excessive toilet paper.  He needed overall min assist for UB bathing with mod assist for LB bathing sit to stand.  Max assist for donning pullover shirt following hemi technique with max assist for LB dressing as well.  He was able to donn his pants over his feet with mod assist however, but needed max for standing and pulling over his hips.  He was not oriented to month or day of the week when asked and was unable to recall the correct ones given after approximately 15 min delay.  Finished session with pt up in the wheelchair and with the call button and safety belt in place.    Therapy Documentation Precautions:  Precautions Precautions: Fall Precaution Comments: L hemi, L inattention Restrictions Weight Bearing Restrictions: No  Pain: Pain Assessment Pain Scale: Faces Pain Score: 0-No pain ADL: See Care Tool Section for some details of mobility and selfcare  Therapy/Group: Individual Therapy  Steven Veazie,[place] OTR/L 03/12/2020, 10:44 AM

## 2020-03-12 NOTE — Patient Care Conference (Signed)
Inpatient RehabilitationTeam Conference and Plan of Care Update Date: 03/12/2020   Time: 10:36 AM    Patient Name: [name]      Medical Record Number: [id number]  Date of Birth: 1940/06/06 Sex: Male         Room/Bed: 4W08C/4W08C-01 Payor Info: Payor: [employer] MEDICARE / Plan: [employer] MEDICARE CHOICE PPO / Product Type: *No Product type* /    Admit Date/Time:  03/05/2020  3:24 PM  Primary Diagnosis:  MSSA bacteremia  Hospital Problems: Principal Problem:   MSSA bacteremia Active Problems:   Type II diabetes mellitus (HCC)   Acute right MCA stroke (HCC)   Protein-calorie malnutrition, severe    Expected Discharge Date: Expected Discharge Date: 03/27/20  Team Members Present: Physician leading conference: Dr. Claudette Laws Care Coodinator Present: Chana Bode, RN, BSN, CRRN;Christina Vita Barley, BSW Nurse Present: Other (comment) Dagoberto Reef, RN) PT Present: Merry Lofty, PT OT Present: Perrin Maltese, OT SLP Present: Suzzette Righter, CF-SLP     Current Status/Progress Goal Weekly Team Focus  Bowel/Bladder   Pt is incontinent of bowel and bladder. LBM-03/11/20  To regain continence.  Assess tolieting needs q2 or as needed.   Swallow/Nutrition/ Hydration   dys 1 texture, nectar liquids, Mod A, oral holding  Min A  increasing carryover and independence with strategies to reduce oral holding and clearace left pocketing, thin trials   ADL's   Min to mod assist for UB bathing and dressing, mod assist for LB bathing, toileting, toilet transfers.  max assist for LB dressing.  LUE Brunnstrum stage III in the arm and hand.  Motor planning deficits noted when attempting functional use.  Increased confusion with decreased awareness noted  min assist overall  cognitive retraining, transfer training, balance retraining, neuromuscular re-education, DME education,   Mobility   up to ModA bed mobility, ModA/MaxA STS/squat pivot transfer, ModAx2 gait up to 54ft, totalA wc in TIS wc   CGA/MinA  improving participation in therapy, L attention, static/dynamic sitting/standing balance, gait progressions, transfer tx   Communication   reduced intelligibility due to dysarthria, language of confusion at times  supervision A  increased vocal intensity and slow rate for greater intelligibility   Safety/Cognition/ Behavioral Observations  Mod-Max A  Min-supervision  sustained attention, safety awareness, recall, basic familiar problem solving   Pain   No complaints of pain or doesn't seem to be in pain.  To remain pain free.  Assess pain q shift or prn.   Skin   Has wounds/abrasions to bilateral knees, left shoulder and left side of forehead. Gets bactroban and foam dressing changes every 3 days or prn.  To promote healing or preventing further skin breakdown.  Assess skin q shift or prn.     Discharge Planning:  Discharging with daughter in [place] [facility]) 2 level home, 2 steps to enter   Team Discussion: Patient presents post multi infarcts plus bacteremia with unknown source. TEE negative. ID continue IV Abx through 04/04/20. MD to initiate ritalin for alertness. Note decrease alertness, perseveration, cognitive deficits, fatigue and can be agitated at times. Requires hand over hand instructions for activities and physical redirection due to poor carry over and poor attention.  Patient on target to meet rehab goals: yes, CGA - Min assist goals set  *See Care Plan and progress notes for long and short-term goals.   Revisions to Treatment Plan:  Currently on  D1 nectar thick liquid diet however hold food and medications in his mouth. Fluctuating orientation; working on attention, carry over  and safety awareness Teaching Needs: Transfers, toileting, safety cues, medications, etc.   Current Barriers to Discharge: Decreased caregiver support, Home enviroment access/layout, Behavior and Nutritional means  Possible Resolutions to Barriers: Family education with daughter       Medical Summary Current Status: poor level of alertness, BPs are labile, Left hemiparesis  Barriers to Discharge: Medical stability;IV antibiotics;Other (comments);Decreased family/caregiver support  Barriers to Discharge Comments: cognitive deficits due to multiple CVAs and encephalopathy from bacteremia Possible Resolutions to [employer] Focus: no local family plans to discharge with daughter in [place], need to coordnate with out of state HH for IV abx   Continued Need for Acute Rehabilitation Level of Care: The patient requires daily medical management by a physician with specialized training in physical medicine and rehabilitation for the following reasons: Direction of a multidisciplinary physical rehabilitation program to maximize functional independence : Yes Medical management of patient stability for increased activity during participation in an intensive rehabilitation regime.: Yes Analysis of laboratory values and/or radiology reports with any subsequent need for medication adjustment and/or medical intervention. : Yes   I attest that I was present, lead the team conference, and concur with the assessment and plan of the team.   Chana Bode B 03/12/2020, 2:22 PM

## 2020-03-12 NOTE — Progress Notes (Signed)
Speech Language Pathology Daily Session Note  Patient Details  Name: [name] MRN: [id number] Date of Birth: 29-May-1940  Today's Date: 03/12/2020 SLP Individual Time: 1300-1330 SLP Individual Time Calculation (min): 30 min  Short Term Goals: Week 1: SLP Short Term Goal 1 (Week 1): Pt will consume current diet with efficient mastication and oral clearance and minimal overt s/sx aspiration with Mod A cueing to reduce oral holding and clear left pocketing. SLP Short Term Goal 2 (Week 1): Pt will consume therapeutic trials of thin H2O with minimal overt s/sx aspiration across 3 sessions to demonstrate readiness for repeat MBSS. SLP Short Term Goal 3 (Week 1): Pt will sustain attention to tasks and functional topics of conversation for 5-7 minute intervals with Min A cues for redirection. SLP Short Term Goal 4 (Week 1): Pt will demonstrate ability to problem solve functional familiar situations with Mod A verbal/visual cues. SLP Short Term Goal 5 (Week 1): Pt will recall new and/or daily information related to therapies with Mod A cues for use of aids or strategies. SLP Short Term Goal 6 (Week 1): Pt will utilize compensatory strategies for speech intelligibility (increased vocal intensity and overarticulation) at the sentence level with Mod A cues.  Skilled Therapeutic Interventions: Pt was seen for skilled ST targeting cognitive goals. Upon arrival pt was in restroom with NT and RN present. SLP took over for RN as +1 and provided Mod-Max A verbal and visual instructional cueing for pt to problem solve body placement and sequence steps of transfer back to bed. Once in bed, he required Min A verbal and visual cues to use an external aid in room to orient to place, but independently oriented to situation, month, self, and month/year. He required Max A to interpret information from a basic weekly weather forecast and unable to recall any daily information when prompted. SLP initiated a memory notebook  for pt to use as compensatory memory strategies in hopes to maximize recall of daily activities and potentially carryover of therapy strategies and safety information. Pt left laying in bed with alarm set and needs within reach. Continue per current plan of care.          Pain Pain Assessment Pain Scale: 0-10 Pain Score: 0-No pain  Therapy/Group: Individual Therapy  [name] 03/12/2020, 3:02 PM

## 2020-03-12 NOTE — Progress Notes (Addendum)
Patient ID: [name], male   DOB: [birth year], 79 y.o.   MRN: [id number]   Daughter interested in having father placed short post discharge at [facility] [phone]) in [place], [place] to complete IV antibiotics. Sw has reached out to facility to determine what insurances are accepted and if they have any bed availability. Sw will continue to follow up.  [place], [place] [phone]

## 2020-03-13 ENCOUNTER — Inpatient Hospital Stay (HOSPITAL_COMMUNITY): Payer: Medicare PPO | Admitting: Physical Therapy

## 2020-03-13 ENCOUNTER — Inpatient Hospital Stay (HOSPITAL_COMMUNITY): Payer: Medicare PPO | Admitting: Occupational Therapy

## 2020-03-13 ENCOUNTER — Inpatient Hospital Stay (HOSPITAL_COMMUNITY): Payer: Medicare PPO | Admitting: Speech Pathology

## 2020-03-13 LAB — GLUCOSE, CAPILLARY
Glucose-Capillary: 133 mg/dL — ABNORMAL HIGH (ref 70–99)
Glucose-Capillary: 220 mg/dL — ABNORMAL HIGH (ref 70–99)
Glucose-Capillary: 159 mg/dL — ABNORMAL HIGH (ref 70–99)
Glucose-Capillary: 184 mg/dL — ABNORMAL HIGH (ref 70–99)

## 2020-03-13 NOTE — Progress Notes (Signed)
Patient ID: [name], male   DOB: Nov 18, 1940, 79 y.o.   MRN: [id number]   SW reached out to daughter's requested facility. Facility requested updates. Followed up this morning and facility is able to accept patient. Will schedule discharge around daughter's schedule. Will follow up with daughter about what day for discharge works for her, tentative next week.   [place], [place] [phone]

## 2020-03-13 NOTE — Plan of Care (Signed)
  Problem: Consults Goal: RH STROKE PATIENT EDUCATION Description: See Patient Education module for education specifics  Outcome: Progressing   Problem: RH BOWEL ELIMINATION Goal: RH STG MANAGE BOWEL WITH ASSISTANCE Description: STG Manage Bowel with Assistance. Min Outcome: Progressing   Problem: RH BLADDER ELIMINATION Goal: RH STG MANAGE BLADDER WITH ASSISTANCE Description: STG Manage Bladder With Min Assistance Outcome: Progressing   Problem: RH SKIN INTEGRITY Goal: RH STG SKIN FREE OF INFECTION/BREAKDOWN Description: Remain free of further breakdown with min assist Outcome: Progressing Goal: RH STG ABLE TO PERFORM INCISION/WOUND CARE W/ASSISTANCE Description: STG Able To Perform Incision/Wound Care With Assistance. Min assist Outcome: Progressing   Problem: RH SAFETY Goal: RH STG ADHERE TO SAFETY PRECAUTIONS W/ASSISTANCE/DEVICE Description: STG Adhere to Safety Precautions With Assistance/Device. Min assist Outcome: Progressing   Problem: RH COGNITION-NURSING Goal: RH STG USES MEMORY AIDS/STRATEGIES W/ASSIST TO PROBLEM SOLVE Description: STG Uses Memory Aids/Strategies With Assistance to Problem Solve. Min assist Outcome: Progressing   Problem: RH PAIN MANAGEMENT Goal: RH STG PAIN MANAGED AT OR BELOW PT'S PAIN GOAL Description: Less than 4 Outcome: Progressing   Problem: RH KNOWLEDGE DEFICIT Goal: RH STG INCREASE KNOWLEDGE OF DIABETES Description: Patient/Caregiver will be able to verbalize management of diabetes including medications, diet with cues/handouts Outcome: Progressing Goal: RH STG INCREASE KNOWLEDGE OF HYPERTENSION Description: Patient/caregiver will be able to verbalize management of hypertension including monitoring blood pressure, medication and diet with cues/handouts Outcome: Progressing Goal: RH STG INCREASE KNOWLEDGE OF DYSPHAGIA/FLUID INTAKE Description: Patient/caregiver will be able to verbalize safe swallowing strategies with  cues/handouts Outcome: Progressing

## 2020-03-13 NOTE — Progress Notes (Signed)
Speech Language Pathology Weekly Progress and Session Note  Patient Details  Name: [name] MRN: [id number] Date of Birth: Sep 09, 1940  Beginning of progress report period: March 06, 2020 End of progress report period: March 13, 2020  Today's Date: 03/13/2020 SLP Individual Time: 0730-0825 SLP Individual Time Calculation (min): 55 min  Short Term Goals: Week 1: SLP Short Term Goal 1 (Week 1): Pt will consume current diet with efficient mastication and oral clearance and minimal overt s/sx aspiration with Mod A cueing to reduce oral holding and clear left pocketing. SLP Short Term Goal 1 - Progress (Week 1): Met SLP Short Term Goal 2 (Week 1): Pt will consume therapeutic trials of thin H2O with minimal overt s/sx aspiration across 3 sessions to demonstrate readiness for repeat MBSS. SLP Short Term Goal 2 - Progress (Week 1): Progressing toward goal SLP Short Term Goal 3 (Week 1): Pt will sustain attention to tasks and functional topics of conversation for 5-7 minute intervals with Min A cues for redirection. SLP Short Term Goal 3 - Progress (Week 1): Progressing toward goal SLP Short Term Goal 4 (Week 1): Pt will demonstrate ability to problem solve functional familiar situations with Mod A verbal/visual cues. SLP Short Term Goal 4 - Progress (Week 1): Progressing toward goal SLP Short Term Goal 5 (Week 1): Pt will recall new and/or daily information related to therapies with Mod A cues for use of aids or strategies. SLP Short Term Goal 5 - Progress (Week 1): Progressing toward goal SLP Short Term Goal 6 (Week 1): Pt will utilize compensatory strategies for speech intelligibility (increased vocal intensity and overarticulation) at the sentence level with Mod A cues. SLP Short Term Goal 6 - Progress (Week 1): Discontinued (comment) (not targeted due to swallowing and cognitive goals taking priority)    New Short Term Goals: Week 2: SLP Short Term Goal 1 (Week 2): Pt will consume  current diet with efficient mastication and oral clearance and minimal overt s/sx aspiration with Min A cueing to reduce oral holding and clear left pocketing. SLP Short Term Goal 2 (Week 2): Pt will consume therapeutic trials of thin H2O with minimal overt s/sx aspiration across 3 sessions to demonstrate readiness for repeat MBSS. SLP Short Term Goal 3 (Week 2): Pt will sustain attention to tasks and functional topics of conversation for 2-3 minute intervals with Mod A cues for redirection. SLP Short Term Goal 4 (Week 2): Pt will demonstrate ability to problem solve functional familiar situations with Mod A verbal/visual cues. SLP Short Term Goal 5 (Week 2): Pt will recall new and/or daily information related to therapies with Mod A cues for use of aids or strategies. SLP Short Term Goal 6 (Week 2): Pt will utilize compensatory strategies for speech intelligibility (increased vocal intensity and overarticulation) at the sentence level with Mod A cues.  Weekly Progress Updates: Pt has made slow minimal functional gains this reporting period and met 1 out of 5 targeted short term goals. One short term goal related to speech intelligibility strategies was not targeted this reporting period, due to severity of cognitive and swallowing deficits which took priority during treatment this week. Current cognitive deficits also make it challenging for pt to self-monitor and recall information, which present additional difficulties in his ability to implement compensatory speech strategies. Pt is currently Mod-Max assist for basic familiar tasks due to severe cognitive impairments impacting his basic problem solving, awareness, sequencing, orientation, short term memory, attention, and intellectual/emergent awareness. His safety awareness is  also very decreased. Pt is consuming a dysphagia 1 (puree) texture diet with nectar thick liquids and requires Moderate cueing for use of strategies to reduce oral holding and  clearance of left buccal pocketing - these oral swallowing deficits are exacerbated when pt is more fatigued. He has participated in some trials of thin H2O with ST, but is not yet demonstrating readiness for repeat instrumental testing. Pt education is ongoing; no family has been present for ST sessions yet. Pt would continue to benefit from skilled ST while inpatient in order to maximize functional independence and reduce burden of care prior to discharge. Anticipate that pt will need 24/7 supervision at discharge in addition to [place] follow up at next level of care.       Intensity: Minumum of 1-2 x/day, 30 to 90 minutes Frequency: 3 to 5 out of 7 days Duration/Length of Stay: 03/27/20 Treatment/Interventions: Cognitive remediation/compensation;Cueing hierarchy;Functional tasks;Patient/family education;Therapeutic Activities;Speech/Language facilitation;Dysphagia/aspiration precaution training;Internal/external aids   Daily Session  Skilled Therapeutic Interventions: Pt was seen for skilled ST targeting dysphagia and cognitive skills. During consumption of current diet (dys 1/nectar) breakfast items, pt demonstrated slight improvement in timeliness of oral transit of boluses, minimal oral holding observed today, and Min left buccal pocketing. SLP provided Min-Mod A verbal cueing for use of his swallow strategies for oral clearance and basic problem solving throughout self-feeding. One instance of delayed coughing noted at end of intake, however difficult to determine whether or not it was linked to any PO intake. Recommend continue current diet. Pt overall more alert and oriented today and able to recall his d/c plan to return to [place] with his daughter. He used Social worker to recall daily information and course of hospitalization with Max faded to Mod A verbal and visual cueing from SLP. He was independently oriented to November but not exact date; with Min A verbal and visual cueing he was able to use  a calendar to find today's date and day of week. Min increasing to Mod A required for sustained attention to tasks in 2-3 minute intervals. Pt left laying in bed with alarm set and needs within reach. Although attempted to provide Max A cueing for implementation of increased vocal intensity to increase pt's intelligibility throughout session, he was note responsive to these cues and as a result remained ~65-70% intelligible throughout session. Continue per current plan of care.            Pain Pain Assessment Pain Scale: Faces Faces Pain Scale: No hurt  Therapy/Group: Individual Therapy  Arbutus Leas 03/13/2020, 12:14 PM

## 2020-03-13 NOTE — Progress Notes (Signed)
Occupational Therapy Session Note  Patient Details  Name: [name] MRN: [id number] Date of Birth: Mar 08, 1941  Today's Date: 03/13/2020 OT Individual Time: 5397-6734 OT Individual Time Calculation (min): 56 min    Short Term Goals: Week 2:  OT Short Term Goal 1 (Week 2): Pt will complete LB dressing with mod assist sit to stand. OT Short Term Goal 2 (Week 2): Pt will use the LUE with mod assist for washing the RUE. OT Short Term Goal 3 (Week 2): Pt will complete UB dressing with min assist for pullover shirt for two sessions. OT Short Term Goal 4 (Week 2): Pt will complete toilet transfers with min assist using the RW for support. OT Short Term Goal 5 (Week 2): Pt will completed bathing tasks with no more than mod instructional cueing for initiation and completion of bathing tasks.  Skilled Therapeutic Interventions/Progress Updates:   Pt received and left in bed with LUE supported by pillow, bed alarm engaged, call bell in reach. Worked on LB dressing and therapeutic activity this date. Pt req sensory stimulation (wet wash cloth) to awake for therapy session. Completed supine>sit EOB with max A. Pt is total A to don Ted hose and to tie shoes; overall pt is max A to don bilat shoes (req directive verbal cues to orient R and L and min A to insert feet into shoes). Pt is at supervision level to maintain static sitting balance but req min A to maintain dynamic sitting balance during LB dressing and therapeutic activity d/t tendency to push to L side and decreased safety awareness/cognition. Completed stand pivot t/f x2 with mod A to TIS w/c. Pt then completed ~5x of bilat shoulder flexion and ~10x of grasping/releasing objects with LUE to promote functional grasp and in preparation for ADL tasks. Pt cont to req HOH A to voluntarily grasp and release objects with L hand 2/2 decreased motor activity and max A to facilitate L shoulder/elbow extension/flexion. Additionally demonstrated decreased  comprehension of verbal directions and sustained attention during therapeutic activity and req max verbal cues and HOH assist for completion (ex., pt began to dribble exercise ball when directed to place ball on table with bilat hands.) Pt additionally showed signs of discomfort (I.e., facial grimacing, pointing) in L shoulder upon ~100 degrees of shoulder flexion. Discomfort resolved upon cessation of activity. Pt will continue to benefit from skilled OT services in order to maximize safety and independence in ADL/functional mobility performance.      Therapy Documentation Precautions:  Precautions Precautions: Fall, Other (comment) (aspiration precautions) Precaution Comments: L hemi, L inattention Restrictions Weight Bearing Restrictions: No    Pain: Pain Assessment Pain Scale: 0-10 Pain Score: 3  Pain Type: Acute pain Pain Location: Shoulder Pain Orientation: Left   ADL: See Care Tool for more details.   Therapy/Group: Individual Therapy  Antonetta Clanton,[place], MS, OTR/L 03/13/2020, 5:22 PM

## 2020-03-13 NOTE — Progress Notes (Signed)
[facility] PHYSICAL MEDICINE & REHABILITATION PROGRESS NOTE   Subjective/Complaints:  Pt wants to go to bathroom, does not use call bell, call bell on left (has left neglect) Discussed that pt will not be able to live indep post d/c and will be relocating to [place] to be near daughter  Spoke with daughter yesterday who was concerned that pt thinks he will be back to normal soon   ROS: Dysarthria as well as cognitive deficits  Objective:   No results found. Recent Labs    03/10/20 1407  WBC 8.2  HGB 10.4*  HCT 31.8*  PLT 256   Recent Labs    03/10/20 1407  NA 136  K 5.1  CL 102  CO2 26  GLUCOSE 242*  BUN 17  CREATININE 1.10  CALCIUM 8.5*    Intake/Output Summary (Last 24 hours) at 03/13/2020 0839 Last data filed at 03/13/2020 0730 Gross per 24 hour  Intake 800 ml  Output 100 ml  Net 700 ml        Physical Exam: Vital Signs Blood pressure (!) 158/74, pulse 63, temperature 98.4 F (36.9 C), resp. rate 18, height 5\' 11"  (1.803 m), weight 66.7 kg, SpO2 100 %.    General: No acute distress Mood and affect are appropriate Heart: Regular rate and rhythm no rubs murmurs or extra sounds Lungs: Clear to auscultation, breathing unlabored, no rales or wheezes Abdomen: Positive bowel sounds, soft nontender to palpation, nondistended Extremities: No clubbing, cyanosis, or edema Skin: No evidence of breakdown, no evidence of rash    Neurologic: Answers yes to most questions  motor strength is 5/5 in RIght and 3/5 Left deltoid, bicep, tricep, grip, 4/5 Left hip flexor, knee extensors, 3/5 left ankle dorsiflexor and plantar flexor  Musculoskeletal: No pain with range of motion, no joint swelling      Assessment/Plan: 1. Functional deficits which require 3+ hours per day of interdisciplinary therapy in a comprehensive inpatient rehab setting.  Physiatrist is providing close team supervision and 24 hour management of active medical problems listed  below.  Physiatrist and rehab team continue to assess barriers to discharge/monitor patient progress toward functional and medical goals  Care Tool:  Bathing    Body parts bathed by patient: Left arm, Chest, Abdomen, Right upper leg, Left upper leg, Left lower leg, Right lower leg, Face   Body parts bathed by helper: Front perineal area, Buttocks, Right arm     Bathing assist Assist Level: Moderate Assistance - Patient 50 - 74%     Upper Body Dressing/Undressing Upper body dressing   What is the patient wearing?: Pull over shirt    Upper body assist Assist Level: Maximal Assistance - Patient 25 - 49%    Lower Body Dressing/Undressing Lower body dressing      What is the patient wearing?: Pants, Incontinence brief     Lower body assist Assist for lower body dressing: Maximal Assistance - Patient 25 - 49%     Toileting Toileting    Toileting assist Assist for toileting: Moderate Assistance - Patient 50 - 74%     Transfers Chair/bed transfer  Transfers assist     Chair/bed transfer assist level: Moderate Assistance - Patient 50 - 74%     Locomotion Ambulation   Ambulation assist      Assist level: Moderate Assistance - Patient 50 - 74% Assistive device: No Device Max distance: 10'   Walk 10 feet activity   Assist     Assist level: 2 helpers (+2  mod  A) Assistive device: Other (comment) (3 Musketeer)   Walk 50 feet activity   Assist Walk 50 feet with 2 turns activity did not occur: Safety/medical concerns (due to patient fatigue/weakness)  Assist level: 2 helpers (+2 mod  A) Assistive device: Other (comment) (3 Musketeer)    Walk 150 feet activity   Assist Walk 150 feet activity did not occur: Safety/medical concerns (due to patient fatigue/weakness)         Walk 10 feet on uneven surface  activity   Assist Walk 10 feet on uneven surfaces activity did not occur: Safety/medical concerns (due to patient fatigue/weakness)          Wheelchair     Assist Will patient use wheelchair at discharge?: Yes Type of Wheelchair: Manual    Wheelchair assist level: Dependent - Patient 0% (TIS wc) Max wheelchair distance: 150    Wheelchair 50 feet with 2 turns activity    Assist        Assist Level: Dependent - Patient 0%   Wheelchair 150 feet activity     Assist      Assist Level: Dependent - Patient 0%   Blood pressure (!) 158/74, pulse 63, temperature 98.4 F (36.9 C), resp. rate 18, height 5\' 11"  (1.803 m), weight 66.7 kg, SpO2 100 %.    Medical Problem List and Plan: 1. 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               -patient may shower             -ELOS/Goals: 22-27 days/min a             Continue CIR, PT,  OT, SLP    2.  Antithrombotics: -DVT/anticoagulation:  Pharmaceutical: Other (comment)--Eliquis.              -antiplatelet therapy: N/A--has been off ASA/Brilinta.  3. Chronic HA/Cervicalgia/Pain Management: Was on flexeril prn PTA. Will monitor N/A. Will order kpad and cervical spine XR given new neck pain and TTP on cervical spine. 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             Monitor with increased exertion 4. Mood: LCSW to follow for evaluation and support.              -antipsychotic agents: Monitor for now.  5. Neuropsych: This patient is not capable of making decisions on his own behalf. 6. Skin/Wound Care: Routine pressure relief measures. Protein supplement due to evidence of malnutrition.  7. Fluids/Electrolytes/Nutrition: Monitor I/Os.              CMP 11/11 is stable.  8. Gastroenteritis: Had multiple episodes of N/V on 11/09 with 4 incontinent stools and low grade fever. Monitor for GI virus. 9.  HTN: Monitor BP tid--educate on importance of compliance. SBP goal 130-150   Vitals:   03/12/20 1932 03/13/20 0427  BP: 117/62 (!) 158/74  Pulse: 77 63  Resp: 16 18  Temp: 98.9 F (37.2 C) 98.4 F (36.9 C)  SpO2: 100% 100%  labile BP, check ortho vitals 10. ICM/Heart failure with persistent apical thrombus: Monitor for signs and symptoms of fluid overload. Daily weights. Continue Eliquis.  11. A fib: Monitor HR tid--continue Lanoxin and Eliquis.  Monitor with increased activity 12. Acute on chronic renal failure: Likely due to gastroenteritis. SCr 1.55-->1.3-->1.53.   Will recheck lytes in am.              Cr trending down on 11/11  11/15- Cr down to 1.1- con't to monitor  13. Electrolytes abnormalities: Resolved post supplementation--recheck magnesium/potassium levels in am.  14. Anemia: Recheck iron levels.                    CBC ordered for tomorrow a.m. 15. T2DM: Hgb A1C-6.8.  Will monitor BS ac/hs. Discontinue metformin due to CKD. Add CM restrictions to diet.              Monitor with increased mobility CBG (last 3)  Recent Labs    03/12/20 1641 03/12/20 2120 03/13/20 0628  GLUCAP 92 290* 133*  fair control , some lability  SSI  16. Aspiration Pneumonitis: no signs of PNA, already on IV Cefazolin, d/c augmentin, neg CXR, nl WBCs 17. Dysphagia: Continue CIR SLP 18. MSSA bacteremia  TEE showed no valve vegetations, no other sig structural abnl in report cardiology to f/u with recs Per ID will need IV cefazolin q 8h, to D/C on 04/05/20.  Will need midline placed if pt leaves hospital setting prior to this date 19.  COgnitive deficits from CVA +/- encephalopathy from bacteremia, may need to add methylphenidate will discuss with team  LOS: 8 days A FACE TO FACE EVALUATION WAS PERFORMED  Erick Colace 03/13/2020, 8:39 AM

## 2020-03-13 NOTE — Progress Notes (Signed)
Physical Therapy Weekly Progress Note  Patient Details  Name: [name] MRN: [id number] Date of Birth: [birth year]  Beginning of progress report period: March 06, 2020 End of progress report period: March 13, 2020  Today's Date: 03/13/2020 PT Individual Time: 0906-1001 PT Individual Time Calculation (min): 55 min   Patient has met 1 of 4 short term goals.  Mr. Fellner is progressing slowly with therapy with significant impairments noted in awareness and attention along with L hemiparesis, impaired midline orientation, and impaired motor planning. He is performing supine<>sit with mod/max assist, sit<>stand and stand pivot transfers with mod assist, and ambulating up to 177f with +2 assistance. His participation in therapy this past week has been impacted by fatigue and impaired awareness of situation. He continues to demonstrate L posterior lean in standing with impaired balance recovery strategies requiring increased assist to maintain upright. Recommend continued CIR level therapies in preparation for discharge to his daughter's home.  Patient continues to demonstrate the following deficits muscle weakness and muscle joint tightness, decreased cardiorespiratoy endurance, impaired timing and sequencing, abnormal tone, unbalanced muscle activation, motor apraxia, decreased coordination and decreased motor planning, decreased motor planning, decreased initiation, decreased attention, decreased awareness, decreased problem solving, decreased safety awareness, decreased memory and delayed processing and decreased sitting balance, decreased standing balance, decreased postural control, hemiplegia and decreased balance strategies and therefore will continue to benefit from skilled PT intervention to increase functional independence with mobility.  Patient progressing toward long term goals..  Continue plan of care.  PT Short Term Goals Week 1:  PT Short Term Goal 1 (Week 1): Patient will be able  to achieve supine > sit with CGA consistently PT Short Term Goal 1 - Progress (Week 1): Progressing toward goal PT Short Term Goal 2 (Week 1): Patient will transfer bed<> wc with MinA x1 at least 50% of the time PT Short Term Goal 2 - Progress (Week 1): Progressing toward goal PT Short Term Goal 3 (Week 1): Patient will ambulate >269fwith MinA x2 with LRAD PT Short Term Goal 3 - Progress (Week 1): Progressing toward goal PT Short Term Goal 4 (Week 1): Patient will complete sit <> stand with no more than ModA x1 and LRAD PT Short Term Goal 4 - Progress (Week 1): Met Week 2:  PT Short Term Goal 1 (Week 2): Pt will perform supine<>sit with min assist PT Short Term Goal 2 (Week 2): Pt will perform sit<>stands using LRAD with min assist PT Short Term Goal 3 (Week 2): Pt will perform bed<>chair transfers using LRAD with min assist PT Short Term Goal 4 (Week 2): Pt will ambulate at least 5042fsing LRAD with min assist of 1  Skilled Therapeutic Interventions/Progress Updates:  Ambulation/gait training;Cognitive remediation/compensation;DME/adaptive equipment instruction;Discharge planning;Functional mobility training;Pain management;Psychosocial support;Splinting/orthotics;Therapeutic Activities;UE/LE Strength taining/ROM;Visual/perceptual remediation/compensation;Balance/vestibular training;Community reintegration;Disease management/prevention;Functional electrical stimulation;Neuromuscular re-education;Patient/family education;Skin care/wound management;Stair training;Therapeutic Exercise;UE/LE Coordination activities;Wheelchair propulsion/positioning   Pt received sitting on BSC over toilet with stedy in place and NT present. Pt agreeable to therapy session so therapist assumed care of pt. Sit>stand BSC>stedy with assist for L UE placement on bar and min assist for lifting into balance - maintained standing balance with CGA during total assist peri-care. Stedy transport to TIS w/c. Seated hand hygiene  with max assist due to pt demoing poor integration of L UE into task despite cuing. RN present to disconnect IV. Sitting in TIS w/c donned shirt, pants, and shoes with max assist for time management.  Transported to/from gym in w/c  for time management and energy conservation. Sit>stands with min assist for lifting and CGA/min assist for balance once in standing - requires assist for L LE positioning prior to initiating stand due to excessive hip external rotation. Stand>sits with min assist for lowering due to poor eccentric control. Gait training 153f with +2 HHA and +2 min assist for balance - demos L lateral lean with lack of full R weight shift during R stance and demos ability to advance L LE ~30% of the time without assist but otherwise requires cuing and min facilitation for increased hip/knee flexion - demos good L knee control during stance although sustains it in slight flexion - cuing throughout for improved gait mechanics. Pt declines further ambulation at this time. R stand pivot w/c>EOM with R HHA and min assist for balance - demos poor R weight shift to step L LE during the transfer. Dynamic standing balance task via playing horseshoes focusing on R weight shift to grasp horseshoe then L weight shift to step R foot forward when tossing horseshoe toward target - requires up to heavy min assist for balance during this task. With encouragement agreeable to additional gait training, 690fwith R HHA and heavy min assist with a few instances of light mod assist for balance - pt demos decreased confidence ambulating without 2nd person support but with encouragement agreeable to continue - therapist facilitating R/L weight shift onto stance limb and continued cuing for increased L LE hip/knee flexion for improved clearance and increased step length. Transported back to room and left tilted back in TIS w/c with needs in reach, L UE therapeutically positioned on pillows, additional head support via towel roll,  and seat belt alarm on.  Therapy Documentation Precautions:  Precautions Precautions: Fall Precaution Comments: L hemi, L inattention Restrictions Weight Bearing Restrictions: No  Pain:   No reports of pain throughout session.  Therapy/Group: Individual Therapy  CaTawana Scale PT, DPT, CSRS  03/13/2020, 7:55 AM

## 2020-03-13 NOTE — Progress Notes (Signed)
Patient ID: [name], male   DOB: 29-Jun-1940, 79 y.o.   MRN: [id number]   SW attempted to call daughter, left voicemail. Awaiting follow up.  [place], [place] [phone]

## 2020-03-13 NOTE — Progress Notes (Signed)
Pt was awake through the night until 6 am despite receiving Trazadone PRN at 2000

## 2020-03-13 NOTE — Progress Notes (Signed)
Occupational Therapy Session Note  Patient Details  Name: [name] MRN: [id number] Date of Birth: April 13, 1941  Today's Date: 03/13/2020 OT Individual Time: 3474-2595 OT Individual Time Calculation (min): 30 min    Short Term Goals: Week 2:  OT Short Term Goal 1 (Week 2): Pt will complete LB dressing with mod assist sit to stand. OT Short Term Goal 2 (Week 2): Pt will use the LUE with mod assist for washing the RUE. OT Short Term Goal 3 (Week 2): Pt will complete UB dressing with min assist for pullover shirt for two sessions. OT Short Term Goal 4 (Week 2): Pt will complete toilet transfers with min assist using the RW for support. OT Short Term Goal 5 (Week 2): Pt will completed bathing tasks with no more than mod instructional cueing for initiation and completion of bathing tasks.  Skilled Therapeutic Interventions/Progress Updates:    Patient seated in TIS w/c attempting to get out of chair upon my arrival.  He states that he needs to use the bathroom.  SPT to/from w/c and toilet with min A.  He is able to pull pants down with min a, continent of bowel and bladder.  Max A for hygiene.  Mod A pants up.  Min A HOH for washing hands and applying lotion.  He notes pain in left shoulder - completed scapular mobility activities, SPT w/c to bed with min A.  Sit to supine min A.  Completed shoulder mobility activities in supine position.   He remained in bed at close of session, bed alarm set and call bell in hand - nursing alerted to shoulder pain.    Therapy Documentation Precautions:  Precautions Precautions: Fall Precaution Comments: L hemi, L inattention Restrictions Weight Bearing Restrictions: No   Therapy/Group: Individual Therapy  Barrie Lyme 03/13/2020, 7:38 AM

## 2020-03-14 ENCOUNTER — Inpatient Hospital Stay (HOSPITAL_COMMUNITY): Payer: Medicare PPO | Admitting: Occupational Therapy

## 2020-03-14 ENCOUNTER — Inpatient Hospital Stay (HOSPITAL_COMMUNITY): Payer: Medicare PPO | Admitting: Speech Pathology

## 2020-03-14 ENCOUNTER — Inpatient Hospital Stay (HOSPITAL_COMMUNITY): Payer: Medicare PPO | Admitting: Physical Therapy

## 2020-03-14 LAB — GLUCOSE, CAPILLARY
Glucose-Capillary: 158 mg/dL — ABNORMAL HIGH (ref 70–99)
Glucose-Capillary: 203 mg/dL — ABNORMAL HIGH (ref 70–99)
Glucose-Capillary: 178 mg/dL — ABNORMAL HIGH (ref 70–99)
Glucose-Capillary: 205 mg/dL — ABNORMAL HIGH (ref 70–99)

## 2020-03-14 MED ORDER — GABAPENTIN 100 MG PO CAPS
100.0000 mg | ORAL_CAPSULE | Freq: Every day | ORAL | Status: DC
  Administered 2020-03-14 – 2020-04-13 (×31): 100 mg via ORAL
  Filled 2020-03-14 (×31): qty 1

## 2020-03-14 NOTE — Progress Notes (Signed)
Speech Language Pathology Daily Session Note  Patient Details  Name: [name] MRN: [id number] Date of Birth: Mar 01, 1941  Today's Date: 03/14/2020 SLP Individual Time: 1300-1355 SLP Individual Time Calculation (min): 55 min  Short Term Goals: Week 2: SLP Short Term Goal 1 (Week 2): Pt will consume current diet with efficient mastication and oral clearance and minimal overt s/sx aspiration with Min A cueing to reduce oral holding and clear left pocketing. SLP Short Term Goal 2 (Week 2): Pt will consume therapeutic trials of thin H2O with minimal overt s/sx aspiration across 3 sessions to demonstrate readiness for repeat MBSS. SLP Short Term Goal 3 (Week 2): Pt will sustain attention to tasks and functional topics of conversation for 2-3 minute intervals with Mod A cues for redirection. SLP Short Term Goal 4 (Week 2): Pt will demonstrate ability to problem solve functional familiar situations with Mod A verbal/visual cues. SLP Short Term Goal 5 (Week 2): Pt will recall new and/or daily information related to therapies with Mod A cues for use of aids or strategies. SLP Short Term Goal 6 (Week 2): Pt will utilize compensatory strategies for speech intelligibility (increased vocal intensity and overarticulation) at the sentence level with Mod A cues.  Skilled Therapeutic Interventions: Pt was seen for skilled ST targeting dysphagia and cognitive goals. Pt was highly distractible today, mostly internally, perseverative on talking about family history and court cases. Therefore, a significant amount of extra time and cueing was required for sustained attention to tasks, particularly when self-feeding his lunch and thin H2O trials to work toward readiness for repeat MBSS. He also required Min A verbal and visual cues for problem solving tray set up and during self-feeding. He also required Mod A for functional use of memory notebook to recall events from OT session.  Pt consumed puree items from lunch  tray with 1 instance of coughing noted when orally holding, however oral transit was mostly efficient. Min A cueing was required for use of liquid wash (nectar drink on tray) to clear left buccal pocketing, Mod A to reduce verbosity for safe intake. Pt also accepted ~4 oz thin H2O without any overt s/sx aspiration today (self fed cup sips), but multiple and occasionally audible swallows noted. Of note, water was room temperature and he required Min A verbal cues for taking smaller boluses. Recommend continue current diet, but pt making good progress toward repeat testing. Pt left laying in bed with alarm set and needs within reach, telesitter active. Continue per current plan of care.          Pain Pain Assessment Pain Scale: 0-10 Pain Score: 0-No pain   Therapy/Group: Individual Therapy  [name] 03/14/2020, 3:00 PM

## 2020-03-14 NOTE — Progress Notes (Signed)
Physical Therapy Session Note  Patient Details  Name: [name] MRN: [id number] Date of Birth: 01/17/1941  Today's Date: 03/14/2020 PT Individual Time: 0906-0959 PT Individual Time Calculation (min): 53 min   Short Term Goals: Week 2:  PT Short Term Goal 1 (Week 2): Pt will perform supine<>sit with min assist PT Short Term Goal 2 (Week 2): Pt will perform sit<>stands using LRAD with min assist PT Short Term Goal 3 (Week 2): Pt will perform bed<>chair transfers using LRAD with min assist PT Short Term Goal 4 (Week 2): Pt will ambulate at least 30ft using LRAD with min assist of 1  Skilled Therapeutic Interventions/Progress Updates:   Pt received supine in bed and initially agreeable to therapy session but then requiring increased encouragement to progress OOB mobility. Donned B LE TED hose max assist. Supine>sitting L EOB, HOB elevated and using bedrail, with mod/max assist for pivoting hips and bringing trunk upright with poor initiation and motor planning. Sitting EOB donned shirt, pants, and shoes max with cuing for sequencing but pt with poor integration of L UE into task. Pt reports need to use bathroom. Sit>stand EOB>+2 assist with heavy mod assist of 1 and +2 min assist due to pt demoing increased L lateral lean/pushing this AM. Standing with R HHA and mod assist for balance pulled pants up over hips total assist. Initiated +2 assist gait to bathroom but pt reports he no longer feels the urge and declines attempting at this time. Stand pivot to w/c with +2 assist mod of 1 and min of 2nd person. Transported to/from gym in w/c for time management and energy conservation. Therapist donned L hand orthotic on RW and educated pt on use. Sit>stands w/c>RW with mod assist for lifting into standing - assist for improved L foot placement due to excessive hip external rotation prior to initiating stand - mod facilitation for increased L hip/knee extension - max cuing to push up with R UE as opposed to  pulling up on RW. Stand>sits RW>w/c with max cuing for unstrapping L hand from orthotic and reaching back with R UE to assist with descent - requires mod assist for eccentric control and balance due to strong L lean during the transition. Gait training ~51ft, ~27ft using RW with mod assist of 1 and +2 w/c follow and close supervision for safety - pt demos moderately strong L lateral trunk lean during ambulation with minimal corrections despite cuing this results in poor R weight shift during stance and lack of sufficient L LE foot clearance and step length during swing. Dynamic standing balance task focusing on R weight shift and L LE stepping via repeated L foot taps on/off 6" step with R HHA - mod assist for balance and pt demoing only mild L lateral lean during this task. Stand pivot w/c<>EOM, no AD, with mod assist for lifting/lowering and min/mod assist for balance while turning. Pt reports urgent need to use bathroom. Transported back to room. R stand pivot w/c>BSC over toilet using grab bar with mod progressing to min assist for balance. Standing using R UE support on grab bar doffed LB clothing total assist. Pt left seated on BSC over toilet with NT present to assume care of pt.  Therapy Documentation Precautions:  Precautions Precautions: Fall, Other (comment) (aspiration precautions) Precaution Comments: L hemi, L inattention Restrictions Weight Bearing Restrictions: No  Pain:   No reports of pain throughout session.  Therapy/Group: Individual Therapy  Ginny Forth , PT, DPT, CSRS  03/14/2020, 7:54  AM  

## 2020-03-14 NOTE — Progress Notes (Signed)
[facility] [facility] PHYSICAL MEDICINE & REHABILITATION PROGRESS NOTE   Subjective/Complaints:  Pt oriented to person and place but not time, c/o neck pain, takes tylenol at home for this also with bilateral foot pain but cannot say how long this has been going on   ROS: Dysarthria as well as cognitive deficits  Objective:   No results found. No results for input(s): WBC, HGB, HCT, PLT in the last 72 hours. No results for input(s): NA, K, CL, CO2, GLUCOSE, BUN, CREATININE, CALCIUM in the last 72 hours.  Intake/Output Summary (Last 24 hours) at 03/14/2020 0810 Last data filed at 03/13/2020 2100 Gross per 24 hour  Intake 580 ml  Output --  Net 580 ml        Physical Exam: Vital Signs Blood pressure 140/69, pulse (!) 57, temperature 97.9 F (36.6 C), resp. rate 16, height 5\' 11"  (1.803 m), weight 67.2 kg, SpO2 100 %.   General: No acute distress Mood and affect are appropriate Heart: Regular rate and rhythm no rubs murmurs or extra sounds Lungs: Clear to auscultation, breathing unlabored, no rales or wheezes Abdomen: Positive bowel sounds, soft nontender to palpation, nondistended Extremities: No clubbing, cyanosis, or edema, weak dorsal pedis pulse , toes are warm , no skin lesions Skin: No evidence of breakdown, no evidence of rash  Neurologic: Answers yes to most questions  motor strength is 5/5 in RIght and 3/5 Left deltoid, bicep, tricep, grip, 4/5 Left hip flexor, knee extensors, 3/5 left ankle dorsiflexor and plantar flexor  Musculoskeletal: No pain with range of motion, no joint swelling      Assessment/Plan: 1. Functional deficits which require 3+ hours per day of interdisciplinary therapy in a comprehensive inpatient rehab setting.  Physiatrist is providing close team supervision and 24 hour management of active medical problems listed below.  Physiatrist and rehab team continue to assess barriers to discharge/monitor patient progress toward functional and medical  goals  Care Tool:  Bathing    Body parts bathed by patient: Left arm, Chest, Abdomen, Right upper leg, Left upper leg, Left lower leg, Right lower leg, Face   Body parts bathed by helper: Front perineal area, Buttocks, Right arm     Bathing assist Assist Level: Moderate Assistance - Patient 50 - 74%     Upper Body Dressing/Undressing Upper body dressing   What is the patient wearing?: Pull over shirt    Upper body assist Assist Level: Maximal Assistance - Patient 25 - 49%    Lower Body Dressing/Undressing Lower body dressing      What is the patient wearing?: Pants, Incontinence brief     Lower body assist Assist for lower body dressing: Maximal Assistance - Patient 25 - 49%     Toileting Toileting    Toileting assist Assist for toileting: Moderate Assistance - Patient 50 - 74%     Transfers Chair/bed transfer  Transfers assist     Chair/bed transfer assist level: Moderate Assistance - Patient 50 - 74% (mod stand pivot t/f)     Locomotion Ambulation   Ambulation assist      Assist level: 2 helpers (+2 min A) Assistive device: Hand held assist Max distance: 160ft   Walk 10 feet activity   Assist     Assist level: 2 helpers (+2 min A) Assistive device: Hand held assist   Walk 50 feet activity   Assist Walk 50 feet with 2 turns activity did not occur: Safety/medical concerns (due to patient fatigue/weakness)  Assist level: 2 helpers (+2  min A) Assistive device: Hand held assist    Walk 150 feet activity   Assist Walk 150 feet activity did not occur: Safety/medical concerns (due to patient fatigue/weakness)         Walk 10 feet on uneven surface  activity   Assist Walk 10 feet on uneven surfaces activity did not occur: Safety/medical concerns (due to patient fatigue/weakness)         Wheelchair     Assist Will patient use wheelchair at discharge?: Yes Type of Wheelchair: Manual    Wheelchair assist level: Dependent -  Patient 0% (TIS w/c) Max wheelchair distance: 150    Wheelchair 50 feet with 2 turns activity    Assist        Assist Level: Dependent - Patient 0%   Wheelchair 150 feet activity     Assist      Assist Level: Dependent - Patient 0%   Blood pressure 140/69, pulse (!) 57, temperature 97.9 F (36.6 C), resp. rate 16, height 5\' 11"  (1.803 m), weight 67.2 kg, SpO2 100 %.    Medical Problem List and Plan: 1. 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               -patient may shower             -ELOS/Goals: 12/2 may need SNF post hospital d/c to complete abx             Continue CIR, PT,  OT, SLP    2.  Antithrombotics: -DVT/anticoagulation:  Pharmaceutical: Other (comment)--Eliquis.              -antiplatelet therapy: N/A--has been off ASA/Brilinta.  3. Chronic HA/Cervicalgia/Pain Management: Was on flexeril prn PTA. Will monitor N/A. Will order kpad and cervical spine XR given new neck pain and TTP on cervical spine. 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             Monitor with increased exertion 4. Mood: LCSW to follow for evaluation and support.              -antipsychotic agents: Monitor for now.  5. Neuropsych: This patient is not capable of making decisions on his own behalf. 6. Skin/Wound Care: Routine pressure relief measures. Protein supplement due to evidence of malnutrition.  7. Fluids/Electrolytes/Nutrition: Monitor I/Os.              CMP 11/11 is stable.  8. Gastroenteritis: Had multiple episodes of N/V on 11/09 with 4 incontinent stools and low grade fever. Monitor for GI virus. 9. HTN: Monitor BP tid--educate on importance of compliance. SBP goal 130-150   Vitals:   03/13/20 1927 03/14/20  0407  BP: (!) 150/61 140/69  Pulse: (!) 54 (!) 57  Resp: 16 16  Temp: 98.4 F (36.9 C) 97.9 F (36.6 C)  SpO2: 99% 100%  BPs stabilizing  10. ICM/Heart failure with persistent apical thrombus: Monitor for signs and symptoms of fluid overload. Daily weights. Continue Eliquis.  11. A fib: Monitor HR tid--continue Lanoxin and Eliquis.              Monitor with increased activity 12. Acute on chronic renal failure: Likely due to gastroenteritis. SCr 1.55-->1.3-->1.53.   Will recheck lytes in am.  Cr trending down on 11/11  11/15- Cr down to 1.1- con't to monitor  13. Electrolytes abnormalities: Resolved post supplementation--recheck magnesium/potassium levels in am.  14. Anemia: Recheck iron levels.                    CBC ordered for tomorrow a.m. 15. T2DM: Hgb A1C-6.8.  probable neuropathy trial gabapentin low dose   Will monitor BS ac/hs. Discontinue metformin due to CKD. Add CM restrictions to diet.              Monitor with increased mobility CBG (last 3)  Recent Labs    03/13/20 1651 03/13/20 2107 03/14/20 0551  GLUCAP 159* 184* 158*  fair control , some lability  SSI  16. Aspiration Pneumonitis: no signs of PNA, already on IV Cefazolin, d/c augmentin, neg CXR, nl WBCs 17. Dysphagia: Continue CIR SLP 18. MSSA bacteremia  TEE showed no valve vegetations, no other sig structural abnl in report cardiology to f/u with recs Per ID will need IV cefazolin q 8h, to D/C on 04/05/20.  Will need midline placed if pt leaves hospital setting prior to this date 19.  COgnitive deficits from CVA +/- encephalopathy from bacteremia, may need to add methylphenidate will discuss with team  LOS: 9 days A FACE TO FACE EVALUATION WAS PERFORMED  Erick Colace 03/14/2020, 8:10 AM

## 2020-03-14 NOTE — Progress Notes (Signed)
Patient ID: [name], male   DOB: [birth year], 79 y.o.   MRN: [id number]  Sw received follow up from SNF. Facility will be able to accept patient November 29th. SW will update daughter. Will begin working on authorization next week for patient.   [place], [place] [phone]

## 2020-03-14 NOTE — Progress Notes (Signed)
Patient ID: [name], male   DOB: February 25, 1941, 79 y.o.   MRN: [id number]   SW spoke with patient daughter. Informed daughter that facility is able to accept patient for bed offer. SW informed daughter of authorization process and team recommendation for family education. Daughter will begin traveling on November 28th and is unsure of the time she will arrive. SW has tentively scheduled daughter for family education on Sunday, November 28th 1-3 PM. Daughter will keep sw posted if she feels like she will be able to make this scheduled education or not. SW will keep team posted.  [place], [place] [phone]

## 2020-03-14 NOTE — Progress Notes (Signed)
Occupational Therapy Session Note  Patient Details  Name: [name] MRN: [id number] Date of Birth: Aug 24, 1940  Today's Date: 03/14/2020 OT Individual Time: 7209-4709 OT Individual Time Calculation (min): 60 min    Today's Date: 03/14/2020 OT Missed Time: 15 Minutes Missed Time Reason: Patient fatigue   Short Term Goals: Week 2:  OT Short Term Goal 1 (Week 2): Pt will complete LB dressing with mod assist sit to stand. OT Short Term Goal 2 (Week 2): Pt will use the LUE with mod assist for washing the RUE. OT Short Term Goal 3 (Week 2): Pt will complete UB dressing with min assist for pullover shirt for two sessions. OT Short Term Goal 4 (Week 2): Pt will complete toilet transfers with min assist using the RW for support. OT Short Term Goal 5 (Week 2): Pt will completed bathing tasks with no more than mod instructional cueing for initiation and completion of bathing tasks.  Skilled Therapeutic Interventions/Progress Updates:    Pt received in bed, the nurse tech had just put him back in bed as pt stated he was very fatigued. Pt agreeable to PROM to address tight/painful L side of neck and LUE.  Worked on massage over neck, scapular gliding followed by shoulder PROM.  He talked about his lack of sleep at night and would like a sleeping pill.  Pt quite talkative.   He suddenly said, "I have to get to the bathroom!" Due to urgency, used the stedy lift from bed to toilet.  Because pt was anxious to get up, he initiated movement very well rolling with CGA, sitting to EOB with min, and sit to stand in stedy with CGA.  He held his balance fairly well in stedy with some lean to the L but did self correct 80% of the way with cues (tactile and verbal). In bathoom, pt stood in stedy with CGA and pulled pants down with min A, self cleansed after having a BM, and pulled pants up himself as therapist provided CGA for balance.   Pt then positioned at sink so he could see himself in the mirror, used mirror  for visual feedback to postural control. Worked on maintaining midline (could hold for 30 sec to 1 min) while completing oral care and brushing teeth.  He became fatigued at this point so transitioned back to bed working on sit to sidelying to supine with min A.  RN reconnected his IV and tech checked blood sugars.  During this time I retrieved equipment for LUE NMR,  Upon return pt sound asleep,  Attempted to wake pt several times but he was very much asleep.  Due to limited sleep last night, did not push for the last 15 min of engagement.  Pt resting in bed with LUE supported, call light in reach and bed alarm on.   Therapy Documentation Precautions:  Precautions Precautions: Fall, Other (comment) (aspiration precautions) Precaution Comments: L hemi, L inattention Restrictions Weight Bearing Restrictions: No General: General OT Amount of Missed Time: 15 Minutes Pain: Pain Assessment Faces Pain Scale: Hurts little more Pain Type: Acute pain Pain Location: Neck Pain Orientation: Left Pain Descriptors / Indicators: Aching;Tightness Pain Onset: On-going Pain Intervention(s): Massage   Therapy/Group: Individual Therapy  Aurora Rody 03/14/2020, 11:55 AM

## 2020-03-15 LAB — GLUCOSE, CAPILLARY
Glucose-Capillary: 138 mg/dL — ABNORMAL HIGH (ref 70–99)
Glucose-Capillary: 225 mg/dL — ABNORMAL HIGH (ref 70–99)
Glucose-Capillary: 153 mg/dL — ABNORMAL HIGH (ref 70–99)
Glucose-Capillary: 160 mg/dL — ABNORMAL HIGH (ref 70–99)

## 2020-03-15 NOTE — Plan of Care (Signed)
  Problem: Consults Goal: RH STROKE PATIENT EDUCATION Description: See Patient Education module for education specifics  Outcome: Progressing   Problem: RH BOWEL ELIMINATION Goal: RH STG MANAGE BOWEL WITH ASSISTANCE Description: STG Manage Bowel with Assistance. Min Outcome: Progressing   Problem: RH BLADDER ELIMINATION Goal: RH STG MANAGE BLADDER WITH ASSISTANCE Description: STG Manage Bladder With Min Assistance Outcome: Progressing   Problem: RH SKIN INTEGRITY Goal: RH STG SKIN FREE OF INFECTION/BREAKDOWN Description: Remain free of further breakdown with min assist Outcome: Progressing Goal: RH STG ABLE TO PERFORM INCISION/WOUND CARE W/ASSISTANCE Description: STG Able To Perform Incision/Wound Care With Assistance. Min assist Outcome: Progressing   Problem: RH COGNITION-NURSING Goal: RH STG USES MEMORY AIDS/STRATEGIES W/ASSIST TO PROBLEM SOLVE Description: STG Uses Memory Aids/Strategies With Assistance to Problem Solve. Min assist Outcome: Progressing   Problem: RH PAIN MANAGEMENT Goal: RH STG PAIN MANAGED AT OR BELOW PT'S PAIN GOAL Description: Less than 4 Outcome: Progressing   Problem: RH KNOWLEDGE DEFICIT Goal: RH STG INCREASE KNOWLEDGE OF DIABETES Description: Patient/Caregiver will be able to verbalize management of diabetes including medications, diet with cues/handouts Outcome: Progressing Goal: RH STG INCREASE KNOWLEDGE OF HYPERTENSION Description: Patient/caregiver will be able to verbalize management of hypertension including monitoring blood pressure, medication and diet with cues/handouts Outcome: Progressing Goal: RH STG INCREASE KNOWLEDGE OF DYSPHAGIA/FLUID INTAKE Description: Patient/caregiver will be able to verbalize safe swallowing strategies with cues/handouts Outcome: Progressing   Problem: RH SAFETY Goal: RH STG ADHERE TO SAFETY PRECAUTIONS W/ASSISTANCE/DEVICE Description: STG Adhere to Safety Precautions With Assistance/Device. Min  assist Outcome: Not Progressing Note: Patient has been placed on tele-monitoring due to continuous attempts to get out of the bed on own.

## 2020-03-16 ENCOUNTER — Inpatient Hospital Stay (HOSPITAL_COMMUNITY): Payer: Medicare PPO

## 2020-03-16 ENCOUNTER — Inpatient Hospital Stay (HOSPITAL_COMMUNITY): Payer: Medicare PPO | Admitting: Physical Therapy

## 2020-03-16 LAB — GLUCOSE, CAPILLARY
Glucose-Capillary: 123 mg/dL — ABNORMAL HIGH (ref 70–99)
Glucose-Capillary: 226 mg/dL — ABNORMAL HIGH (ref 70–99)
Glucose-Capillary: 164 mg/dL — ABNORMAL HIGH (ref 70–99)
Glucose-Capillary: 221 mg/dL — ABNORMAL HIGH (ref 70–99)

## 2020-03-16 NOTE — Plan of Care (Signed)
  Problem: RH BOWEL ELIMINATION Goal: RH STG MANAGE BOWEL WITH ASSISTANCE Description: STG Manage Bowel with Assistance. Min Outcome: Progressing   Problem: RH BLADDER ELIMINATION Goal: RH STG MANAGE BLADDER WITH ASSISTANCE Description: STG Manage Bladder With Min Assistance Outcome: Progressing   Problem: RH SKIN INTEGRITY Goal: RH STG SKIN FREE OF INFECTION/BREAKDOWN Description: Remain free of further breakdown with min assist Outcome: Progressing Goal: RH STG ABLE TO PERFORM INCISION/WOUND CARE W/ASSISTANCE Description: STG Able To Perform Incision/Wound Care With Assistance. Min assist Outcome: Progressing   Problem: RH COGNITION-NURSING Goal: RH STG USES MEMORY AIDS/STRATEGIES W/ASSIST TO PROBLEM SOLVE Description: STG Uses Memory Aids/Strategies With Assistance to Problem Solve. Min assist Outcome: Progressing   Problem: RH PAIN MANAGEMENT Goal: RH STG PAIN MANAGED AT OR BELOW PT'S PAIN GOAL Description: Less than 4 Outcome: Progressing   Problem: RH KNOWLEDGE DEFICIT Goal: RH STG INCREASE KNOWLEDGE OF DIABETES Description: Patient/Caregiver will be able to verbalize management of diabetes including medications, diet with cues/handouts Outcome: Progressing Goal: RH STG INCREASE KNOWLEDGE OF HYPERTENSION Description: Patient/caregiver will be able to verbalize management of hypertension including monitoring blood pressure, medication and diet with cues/handouts Outcome: Progressing Goal: RH STG INCREASE KNOWLEDGE OF DYSPHAGIA/FLUID INTAKE Description: Patient/caregiver will be able to verbalize safe swallowing strategies with cues/handouts Outcome: Progressing   Problem: Consults Goal: RH STROKE PATIENT EDUCATION Description: See Patient Education module for education specifics  Outcome: Not Progressing Note: Patient not receptive to education.   Problem: RH SAFETY Goal: RH STG ADHERE TO SAFETY PRECAUTIONS W/ASSISTANCE/DEVICE Description: STG Adhere to Safety  Precautions With Assistance/Device. Min assist Outcome: Not Progressing

## 2020-03-16 NOTE — Progress Notes (Signed)
Speech Language Pathology Daily Session Note  Patient Details  Name: [name] MRN: [id number] Date of Birth: 02/02/1941  Today's Date: 03/16/2020 SLP Individual Time: 1300-1340 SLP Individual Time Calculation (min): 40 min  Short Term Goals: Week 2: SLP Short Term Goal 1 (Week 2): Pt will consume current diet with efficient mastication and oral clearance and minimal overt s/sx aspiration with Min A cueing to reduce oral holding and clear left pocketing. SLP Short Term Goal 2 (Week 2): Pt will consume therapeutic trials of thin H2O with minimal overt s/sx aspiration across 3 sessions to demonstrate readiness for repeat MBSS. SLP Short Term Goal 3 (Week 2): Pt will sustain attention to tasks and functional topics of conversation for 2-3 minute intervals with Mod A cues for redirection. SLP Short Term Goal 4 (Week 2): Pt will demonstrate ability to problem solve functional familiar situations with Mod A verbal/visual cues. SLP Short Term Goal 5 (Week 2): Pt will recall new and/or daily information related to therapies with Mod A cues for use of aids or strategies. SLP Short Term Goal 6 (Week 2): Pt will utilize compensatory strategies for speech intelligibility (increased vocal intensity and overarticulation) at the sentence level with Mod A cues.  Skilled Therapeutic Interventions:Skilled ST services focused on swallow and cognitive skills. SLP facilitated PO consumption of dys 1 textures and nectar thick liquid lunch tray. Pt consumed PO intake at a relatively fast rate with intermittent holding, but only x1 immediate cough noted (when attempting to add more to oral cavity prior to completion of swallow.) Pt demonstrated ability to clear oral cavity, no residue noted. SLP also facilitated sustained attention and problem solving skills in novel card task WAR. Pt required cuing for errors in 2 minute intervals likely due to inattention. Pt was able to correct error with supervision A verbal cues.  Pt was left in room with call bell within reach and bed alarm set. SLP recommends to continue skilled services.     Pain Pain Assessment Pain Score: 0-No pain  Therapy/Group: Individual Therapy  Joren Rehm  [facility] 03/16/2020, 1:44 PM

## 2020-03-16 NOTE — Progress Notes (Signed)
Physical Therapy Session Note  Patient Details  Name: [name] MRN: [id number] Date of Birth: 09-23-1940  Today's Date: 03/16/2020 PT Individual Time: 0900-0945 PT Individual Time Calculation (min): 45 min   Short Term Goals: Week 1:  PT Short Term Goal 1 (Week 1): Patient will be able to achieve supine > sit with CGA consistently PT Short Term Goal 1 - Progress (Week 1): Progressing toward goal PT Short Term Goal 2 (Week 1): Patient will transfer bed<> wc with MinA x1 at least 50% of the time PT Short Term Goal 2 - Progress (Week 1): Progressing toward goal PT Short Term Goal 3 (Week 1): Patient will ambulate >16f with MinA x2 with LRAD PT Short Term Goal 3 - Progress (Week 1): Progressing toward goal PT Short Term Goal 4 (Week 1): Patient will complete sit <> stand with no more than ModA x1 and LRAD PT Short Term Goal 4 - Progress (Week 1): Met Week 2:  PT Short Term Goal 1 (Week 2): Pt will perform supine<>sit with min assist PT Short Term Goal 2 (Week 2): Pt will perform sit<>stands using LRAD with min assist PT Short Term Goal 3 (Week 2): Pt will perform bed<>chair transfers using LRAD with min assist PT Short Term Goal 4 (Week 2): Pt will ambulate at least 572fusing LRAD with min assist of 1  Skilled Therapeutic Interventions/Progress Updates:    pt received in bed and agreeable to therapy. Pt reported pain all over, unable to localize where or rank. Bilateral TED hose donned in supine, total A.  Pt directed in supine>sit EOB min A with single step cues and increased time to process. Once in sitting EOB, miN A for static sitting balance and mod A for hip alignment and scooting to EOB. Pt directed in face washing with RUE and VC for washing Lt side of face but able to complete. Pt then directed in dynamic sitting balance at EOB with reaching inside, progressing outside BOS in various directions with right UE, 2x10 completed VC for participation and technique. Pt reported fatigued  and attempted to return to supine, but agreeable to participate in one more activity in sitting, pt directed in reaching forward for item retrieval with second step cues to identify object such as, "grab the tallest bottle" or "grab the bottle with green label" to improve attention to Lt side but also increased sitting balance and attention to task, min A for sitting balance. Pt verbalized he was hungry and wanted breakfast, PT agreed to assist pt in snack from floor until tray arrived. Pt returned to supine, min A for LLE management onto bed. Bed alarm set, PT retrieved Magic Cup from floor, nurse agreeable. Pt directed in supine>sit min A and Pt able to use Rt UE for feeding PT supporting cup. Tray then came with 2-3 mins sitting bedside, tray setup for increased therapeutic emphasis with items on Lt side of tray and VC to attend to task, min A for balance with noted Lt side lean. Pt directed in sitting EOB for breakfast for 10 mins at min A, then PT left at end of session, nursing present and taken over supervision for pt's eating.   Therapy Documentation Precautions:  Precautions Precautions: Fall, Other (comment) (aspiration precautions) Precaution Comments: L hemi, L inattention Restrictions Weight Bearing Restrictions: No General:   Vital Signs: Therapy Vitals Pulse Rate: 83 BP: 127/76 Pain:   Mobility:   Locomotion :    Trunk/Postural Assessment :    Balance:  Exercises:   Other Treatments:      Therapy/Group: Individual Therapy  [name] 03/16/2020, 10:27 AM

## 2020-03-16 NOTE — Progress Notes (Signed)
Occupational Therapy Session Note  Patient Details  Name: [name] MRN: [id number] Date of Birth: 1940/08/16  Today's Date: 03/16/2020 OT Individual Time: 2072-1828 OT Individual Time Calculation (min): 23 min   Session 2: Pt refusal- 45 min missed    Short Term Goals: Week 2:  OT Short Term Goal 1 (Week 2): Pt will complete LB dressing with mod assist sit to stand. OT Short Term Goal 2 (Week 2): Pt will use the LUE with mod assist for washing the RUE. OT Short Term Goal 3 (Week 2): Pt will complete UB dressing with min assist for pullover shirt for two sessions. OT Short Term Goal 4 (Week 2): Pt will complete toilet transfers with min assist using the RW for support. OT Short Term Goal 5 (Week 2): Pt will completed bathing tasks with no more than mod instructional cueing for initiation and completion of bathing tasks.  Skilled Therapeutic Interventions/Progress Updates:    Upon OT entering room pt opening eyes and yelling "get out of my room". Introduced self and role and pt stated he would like to get up to his chair. Pt very rude/nasty to OT throughout session despite eagerness to get to chair. Pt not oriented to year or place, but oriented to situation generally. Pt completed bed mobility with mod A to come EOB. Mod A squat pivot transfer to the TIS w/c with mod cueing for technique. Pt completed face washing and changed gown at the sink with mod A. Pt initially stated he would like to stay up in the w/c before demanding to be put back to bed. Mod A for another squat pivot transfer. Pt left supine with all needs met, bed alarm set.    Session 2:  Entered room, pt supine and frowning at therapist. Pt adamantly declining all participation in therapy. Pt requested a cup of coffee, attempted to get this for the pt, but the unit's one machine was not functioning. Pt notified and unhappy with this. Offered to help him call cafeteria for one to brought up with dinner for nurse to thicken,  he declined. Pt left supine, all needs met. 45 min missed.    Therapy Documentation Precautions:  Precautions Precautions: Fall, Other (comment) (aspiration precautions) Precaution Comments: L hemi, L inattention Restrictions Weight Bearing Restrictions: No   Therapy/Group: Individual Therapy  Curtis Sites 03/16/2020, 6:45 AM

## 2020-03-16 NOTE — Progress Notes (Signed)
[place] PHYSICAL MEDICINE & REHABILITATION PROGRESS NOTE   Subjective/Complaints:  Eating lunch, without c/o, per CNA pt incont of bowel and bladder, calls for assist but usually too late  ROS: Dysarthria as well as cognitive deficits  Objective:   No results found. No results for input(s): WBC, HGB, HCT, PLT in the last 72 hours. No results for input(s): NA, K, CL, CO2, GLUCOSE, BUN, CREATININE, CALCIUM in the last 72 hours.  Intake/Output Summary (Last 24 hours) at 03/16/2020 1215 Last data filed at 03/15/2020 1835 Gross per 24 hour  Intake 510 ml  Output --  Net 510 ml        Physical Exam: Vital Signs Blood pressure 127/76, pulse 83, temperature 98.4 F (36.9 C), temperature source Oral, resp. rate 17, height 5\' 11"  (1.803 m), weight 67.2 kg, SpO2 100 %.  General: No acute distress Mood and affect are appropriate Heart: Regular rate and rhythm no rubs murmurs or extra sounds Lungs: Clear to auscultation, breathing unlabored, no rales or wheezes Abdomen: Positive bowel sounds, soft nontender to palpation, nondistended Extremities: No clubbing, cyanosis, or edema Skin: No evidence of breakdown, no evidence of rash  Neurologic: Answers yes to most questions  motor strength is 5/5 in RIght and 3/5 Left deltoid, bicep, tricep, grip, 4/5 Left hip flexor, knee extensors, 3/5 left ankle dorsiflexor and plantar flexor  Musculoskeletal: No pain with range of motion, no joint swelling      Assessment/Plan: 1. Functional deficits which require 3+ hours per day of interdisciplinary therapy in a comprehensive inpatient rehab setting.  Physiatrist is providing close team supervision and 24 hour management of active medical problems listed below.  Physiatrist and rehab team continue to assess barriers to discharge/monitor patient progress toward functional and medical goals  Care Tool:  Bathing    Body parts bathed by patient: Left arm, Chest, Abdomen, Right upper  leg, Left upper leg, Left lower leg, Right lower leg, Face   Body parts bathed by helper: Front perineal area, Buttocks, Right arm     Bathing assist Assist Level: Moderate Assistance - Patient 50 - 74%     Upper Body Dressing/Undressing Upper body dressing   What is the patient wearing?: Pull over shirt    Upper body assist Assist Level: Maximal Assistance - Patient 25 - 49%    Lower Body Dressing/Undressing Lower body dressing      What is the patient wearing?: Pants, Incontinence brief     Lower body assist Assist for lower body dressing: Maximal Assistance - Patient 25 - 49%     Toileting Toileting    Toileting assist Assist for toileting: Moderate Assistance - Patient 50 - 74%     Transfers Chair/bed transfer  Transfers assist     Chair/bed transfer assist level: Moderate Assistance - Patient 50 - 74% (stand pivot)     Locomotion Ambulation   Ambulation assist      Assist level: Moderate Assistance - Patient 50 - 74% Assistive device: Walker-rolling Max distance: 62ft   Walk 10 feet activity   Assist     Assist level: Moderate Assistance - Patient - 50 - 74% Assistive device: Walker-rolling   Walk 50 feet activity   Assist Walk 50 feet with 2 turns activity did not occur: Safety/medical concerns (due to patient fatigue/weakness)  Assist level: Moderate Assistance - Patient - 50 - 74% Assistive device: Walker-rolling    Walk 150 feet activity   Assist Walk 150 feet activity did not occur: Safety/medical concerns (  due to patient fatigue/weakness)         Walk 10 feet on uneven surface  activity   Assist Walk 10 feet on uneven surfaces activity did not occur: Safety/medical concerns (due to patient fatigue/weakness)         Wheelchair     Assist Will patient use wheelchair at discharge?: Yes Type of Wheelchair: Manual    Wheelchair assist level: Dependent - Patient 0% (TIS w/c) Max wheelchair distance: 150     Wheelchair 50 feet with 2 turns activity    Assist        Assist Level: Dependent - Patient 0%   Wheelchair 150 feet activity     Assist      Assist Level: Dependent - Patient 0%   Blood pressure 127/76, pulse 83, temperature 98.4 F (36.9 C), temperature source Oral, resp. rate 17, height 5\' 11"  (1.803 m), weight 67.2 kg, SpO2 100 %.    Medical Problem List and Plan: 1. 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               -patient may shower             -ELOS/Goals: 12/2 may need SNF post hospital d/c to complete abx             Continue CIR, PT,  OT, SLP    2.  Antithrombotics: -DVT/anticoagulation:  Pharmaceutical: Other (comment)--Eliquis.              -antiplatelet therapy: N/A--has been off ASA/Brilinta.  3. Chronic HA/Cervicalgia/Pain Management: Was on flexeril prn PTA. Will monitor N/A. Will order kpad and cervical spine XR given new neck pain and TTP on cervical spine. 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             Monitor with increased exertion 4. Mood: LCSW to follow for evaluation and support.              -antipsychotic agents: Monitor for now.  5. Neuropsych: This patient is not capable of making decisions on his own behalf. 6. Skin/Wound Care: Routine pressure relief measures. Protein supplement due to evidence of malnutrition.  7. Fluids/Electrolytes/Nutrition: Monitor I/Os.              CMP 11/11 is stable.  8. Gastroenteritis: Had multiple episodes of N/V on 11/09 with 4 incontinent stools and low grade fever. Monitor for GI virus. 9. HTN: Monitor BP tid--educate on importance of compliance. SBP goal 130-150   Vitals:   03/16/20 0350 03/16/20 0952  BP: (!) 147/64 127/76   Pulse: (!) 54 83  Resp: 17   Temp: 98.4 F (36.9 C)   SpO2: 100%   BPs stabilizing  10. ICM/Heart failure with persistent apical thrombus: Monitor for signs and symptoms of fluid overload. Daily weights. Continue Eliquis.  11. A fib: Monitor HR tid--continue Lanoxin and Eliquis.              Monitor with increased activity 12. Acute on chronic renal failure: Likely due to gastroenteritis. SCr 1.55-->1.3-->1.53.   Will recheck lytes in am.              Cr trending down on 11/11  11/15- Cr down to 1.1- con't to monitor  13. Electrolytes abnormalities: Resolved post supplementation--recheck magnesium/potassium levels in am.  14. Anemia: Recheck iron levels.                    CBC ordered for tomorrow a.m. 15. T2DM: Hgb A1C-6.8.  probable neuropathy trial gabapentin low dose   Will monitor BS ac/hs. Discontinue metformin due to CKD. Add CM restrictions to diet.              Monitor with increased mobility CBG (last 3)  Recent Labs    03/15/20 2122 03/16/20 0612 03/16/20 1126  GLUCAP 160* 123* 226*  fair control , some lability  SSI  16. Aspiration Pneumonitis: no signs of PNA, already on IV Cefazolin, d/c augmentin, neg CXR, nl WBCs 17. Dysphagia: Continue CIR SLP 18. MSSA bacteremia  TEE showed no valve vegetations, no other sig structural abnl in report cardiology to f/u with recs Per ID will need IV cefazolin q 8h, to D/C on 04/05/20.  Will need midline placed if pt leaves hospital setting prior to this date 19.  COgnitive deficits from CVA +/- encephalopathy from bacteremia, may need to add methylphenidate will discuss with team  LOS: 11 days A FACE TO FACE EVALUATION WAS PERFORMED  Erick Colace 03/16/2020, 12:15 PM

## 2020-03-17 ENCOUNTER — Inpatient Hospital Stay (HOSPITAL_COMMUNITY): Payer: Medicare PPO

## 2020-03-17 ENCOUNTER — Inpatient Hospital Stay (HOSPITAL_COMMUNITY): Payer: Medicare PPO | Admitting: Occupational Therapy

## 2020-03-17 ENCOUNTER — Inpatient Hospital Stay (HOSPITAL_COMMUNITY): Payer: Medicare PPO | Admitting: Speech Pathology

## 2020-03-17 LAB — BASIC METABOLIC PANEL
Anion gap: 8 (ref 5–15)
BUN: 36 mg/dL — ABNORMAL HIGH (ref 8–23)
CO2: 23 mmol/L (ref 22–32)
Calcium: 8.7 mg/dL — ABNORMAL LOW (ref 8.9–10.3)
Chloride: 107 mmol/L (ref 98–111)
Creatinine, Ser: 1.21 mg/dL (ref 0.61–1.24)
GFR, Estimated: >60 mL/min (ref >60)
Glucose, Bld: 169 mg/dL — ABNORMAL HIGH (ref 70–99)
Potassium: 4.4 mmol/L (ref 3.5–5.1)
Sodium: 138 mmol/L (ref 135–145)

## 2020-03-17 LAB — CBC WITH DIFFERENTIAL/PLATELET
Abs Immature Granulocytes: 0.03 10*3/uL (ref 0.00–0.07)
Basophils Absolute: 0 10*3/uL (ref 0.0–0.1)
Basophils Relative: 1 %
Eosinophils Absolute: 0.1 10*3/uL (ref 0.0–0.5)
Eosinophils Relative: 1 %
HCT: 29.9 % — ABNORMAL LOW (ref 39.0–52.0)
Hemoglobin: 9.7 g/dL — ABNORMAL LOW (ref 13.0–17.0)
Immature Granulocytes: 0 %
Lymphocytes Relative: 30 %
Lymphs Abs: 2.2 10*3/uL (ref 0.7–4.0)
MCH: 31 pg (ref 26.0–34.0)
MCHC: 32.4 g/dL (ref 30.0–36.0)
MCV: 95.5 fL (ref 80.0–100.0)
Monocytes Absolute: 0.6 10*3/uL (ref 0.1–1.0)
Monocytes Relative: 8 %
Neutro Abs: 4.5 10*3/uL (ref 1.7–7.7)
Neutrophils Relative %: 60 %
Platelets: 301 10*3/uL (ref 150–400)
RBC: 3.13 MIL/uL — ABNORMAL LOW (ref 4.22–5.81)
RDW: 12.2 % (ref 11.5–15.5)
WBC: 7.5 10*3/uL (ref 4.0–10.5)
nRBC: 0 % (ref 0.0–0.2)

## 2020-03-17 LAB — GLUCOSE, CAPILLARY
Glucose-Capillary: 121 mg/dL — ABNORMAL HIGH (ref 70–99)
Glucose-Capillary: 181 mg/dL — ABNORMAL HIGH (ref 70–99)
Glucose-Capillary: 160 mg/dL — ABNORMAL HIGH (ref 70–99)
Glucose-Capillary: 225 mg/dL — ABNORMAL HIGH (ref 70–99)

## 2020-03-17 LAB — SEDIMENTATION RATE: Sed Rate: 27 mm/hr — ABNORMAL HIGH (ref 0–16)

## 2020-03-17 LAB — C-REACTIVE PROTEIN: CRP: 1 mg/dL — ABNORMAL HIGH (ref <1.0)

## 2020-03-17 MED ORDER — SODIUM CHLORIDE 0.9 % IV SOLN: INTRAVENOUS | Status: AC

## 2020-03-17 NOTE — Progress Notes (Signed)
Occupational Therapy Session Note  Patient Details  Name: [name] MRN: [id number] Date of Birth: Mar 17, 1941  Today's Date: 03/17/2020 OT Individual Time: 1700-1749 OT Individual Time Calculation (min): 57 min    Short Term Goals: Week 2:  OT Short Term Goal 1 (Week 2): Pt will complete LB dressing with mod assist sit to stand. OT Short Term Goal 2 (Week 2): Pt will use the LUE with mod assist for washing the RUE. OT Short Term Goal 3 (Week 2): Pt will complete UB dressing with min assist for pullover shirt for two sessions. OT Short Term Goal 4 (Week 2): Pt will complete toilet transfers with min assist using the RW for support. OT Short Term Goal 5 (Week 2): Pt will completed bathing tasks with no more than mod instructional cueing for initiation and completion of bathing tasks.  Skilled Therapeutic Interventions/Progress Updates:    Pt greeted at time of session sitting up in TIS wheelchair just finished toileting with nursing staff, agreeable to OT session. Limited verbal communication throughout session but was able to answer yes/no questions with fairly accurate results. When asked if pt had pain, nodded head yes and pointed to whole body but no number given, rest breaks given PRN. Transported to gym via wheelchair and performed SCIFIT on level 1 for 5 minutes forward with therapist assist at wrist and ace wrap to hand to hold in place. Pt stating he was tired and did not want to do any more but when brought to other gym for change of scenery, pt participated in [facility] for LUE for shoulder flexion, abduction, elbow flex/ext, forearm sup/pro, and wrist flex/ext in increments of 10-20. Cues to decrease shoulder compensatory movements thorughout. Dynamic seated activity with 1kg red ball for bilateral integration for chest press forward, mirror for feedback, 2x15. Block building activity to improve sequencing, following commands, and copying an image with approx 75% accuracy. Transported back  to room via wheelchair and set upwith alarm on, call bell in reach, telesitter on.    Therapy Documentation Precautions:  Precautions Precautions: Fall, Other (comment) (aspiration precautions) Precaution Comments: L hemi, L inattention Restrictions Weight Bearing Restrictions: No     Therapy/Group: Individual Therapy  Erasmo Score 03/17/2020, 3:26 PM

## 2020-03-17 NOTE — Progress Notes (Addendum)
Speech Language Pathology Daily Session Note  Patient Details  Name: [name] MRN: [id number] Date of Birth: May 21, 1940  Today's Date: 03/17/2020 SLP Individual Time: 1300-1325 SLP Individual Time Calculation (min): 25 min  Short Term Goals: Week 2: SLP Short Term Goal 1 (Week 2): Pt will consume current diet with efficient mastication and oral clearance and minimal overt s/sx aspiration with Min A cueing to reduce oral holding and clear left pocketing. SLP Short Term Goal 2 (Week 2): Pt will consume therapeutic trials of thin H2O with minimal overt s/sx aspiration across 3 sessions to demonstrate readiness for repeat MBSS. SLP Short Term Goal 3 (Week 2): Pt will sustain attention to tasks and functional topics of conversation for 2-3 minute intervals with Mod A cues for redirection. SLP Short Term Goal 4 (Week 2): Pt will demonstrate ability to problem solve functional familiar situations with Mod A verbal/visual cues. SLP Short Term Goal 5 (Week 2): Pt will recall new and/or daily information related to therapies with Mod A cues for use of aids or strategies. SLP Short Term Goal 6 (Week 2): Pt will utilize compensatory strategies for speech intelligibility (increased vocal intensity and overarticulation) at the sentence level with Mod A cues.  Skilled Therapeutic Interventions: Pt was seen for skilled ST targeting swallow and cognitive skills. Upon entrance to room, pt found to have detached seatbelt alarm, although alarm was set to "on" position, and demanding for therapist to remove seatbelt and take him to restroom. He required Moderate verbal cueing for sequencing and safety awareness when transferring to toilet via stedy, as well as from stedy back to chair. He accepted 2 ice chips from therapist with no overt s/sx aspiration, but then became increasingly verbally agitated, telling therapist to "shut the [explative] up" and swatting at therapist with newspaper. SLP attempted to  de-escalate, ensuring pt objective of visit was to help him recover from stroke. Although he was independently oriented to the situation, he continues to required Total A for awareness of deficits, as he denied any speech, swallowing, cognitive, or physical impairments. Due to increase in agitation and pt's unwillingness to participate further, the session was ended 20 mins early. Pt left sitting semi-reclined in tilt in space wheelchair, seatbelt alarm in place and turned on, telesitter active, needs within reach, and RN aware of pt's current behavior/agitation. Continue per current plan of care.          Pain Pain Assessment Pain Scale: Faces Faces Pain Scale: No hurt  Therapy/Group: Individual Therapy  [name] 03/17/2020, 1:32 PM

## 2020-03-17 NOTE — Progress Notes (Signed)
Labs still pending from noon. Called Phlebotomy, x [phone], x [phone], x [phone], x [phone], no answer, called main lab x [phone], transferred TL said will contact Aram Beecham and if cant reach her will come and draw labs. Will continue plan of care.

## 2020-03-17 NOTE — Progress Notes (Signed)
[facility] PHYSICAL MEDICINE & REHABILITATION PROGRESS NOTE   Subjective/Complaints:  Pt is very sedated- appears dry per nursing, but keeps refusing labs- since 11/15- explained we truly need them sometimes today.   Will give IVFs since BUN up to 36 from 17 when last checked  ROS: unable to assess due to sedation  Objective:   No results found. Recent Labs    03/17/20 1820  WBC 7.5  HGB 9.7*  HCT 29.9*  PLT 301   Recent Labs    03/17/20 1708  NA 138  K 4.4  CL 107  CO2 23  GLUCOSE 169*  BUN 36*  CREATININE 1.21  CALCIUM 8.7*    Intake/Output Summary (Last 24 hours) at 03/17/2020 1937 Last data filed at 03/17/2020 1700 Gross per 24 hour  Intake 600 ml  Output --  Net 600 ml        Physical Exam: Vital Signs Blood pressure 120/72, pulse 66, temperature 98.7 F (37.1 C), resp. rate 18, height 5\' 11"  (1.803 m), weight 65.4 kg, SpO2 95 %.  General: No acute distress- asleep- didn't want to wake more than to shake his head "no" if any issues, NAD Mood and affect are sedated Heart: RRR Lungs: CTA B/L- no W/R/R- good air movement Abdomen: Soft, NT, ND, (+)BS  Extremities: No clubbing, cyanosis, or edema Skin: No evidence of breakdown, no evidence of rash Has tenting- Neurologic: Answers yes to most questions  motor strength is 5/5 in RIght and 3/5 Left deltoid, bicep, tricep, grip, 4/5 Left hip flexor, knee extensors, 3/5 left ankle dorsiflexor and plantar flexor  Musculoskeletal: No pain with range of motion, no joint swelling      Assessment/Plan: 1. Functional deficits which require 3+ hours per day of interdisciplinary therapy in a comprehensive inpatient rehab setting.  Physiatrist is providing close team supervision and 24 hour management of active medical problems listed below.  Physiatrist and rehab team continue to assess barriers to discharge/monitor patient progress toward functional and medical goals  Care Tool:  Bathing    Body parts  bathed by patient: Chest, Abdomen, Left arm, Front perineal area, Buttocks, Face   Body parts bathed by helper: Right arm Body parts n/a: Right upper leg, Left upper leg, Right lower leg, Left lower leg (did not attempt secondary to time)   Bathing assist Assist Level: Minimal Assistance - Patient > 75%     Upper Body Dressing/Undressing Upper body dressing   What is the patient wearing?: Pull over shirt    Upper body assist Assist Level: Moderate Assistance - Patient 50 - 74%    Lower Body Dressing/Undressing Lower body dressing      What is the patient wearing?: Pants, Incontinence brief     Lower body assist Assist for lower body dressing: Maximal Assistance - Patient 25 - 49%     Toileting Toileting    Toileting assist Assist for toileting: Moderate Assistance - Patient 50 - 74%     Transfers Chair/bed transfer  Transfers assist     Chair/bed transfer assist level: Moderate Assistance - Patient 50 - 74%     Locomotion Ambulation   Ambulation assist      Assist level: Moderate Assistance - Patient 50 - 74% Assistive device: Hand held assist Max distance: 10'   Walk 10 feet activity   Assist     Assist level: Moderate Assistance - Patient - 50 - 74% Assistive device: Walker-rolling   Walk 50 feet activity   Assist Walk 50  feet with 2 turns activity did not occur: Safety/medical concerns (due to patient fatigue/weakness)  Assist level: Moderate Assistance - Patient - 50 - 74% Assistive device: Walker-rolling    Walk 150 feet activity   Assist Walk 150 feet activity did not occur: Safety/medical concerns (due to patient fatigue/weakness)         Walk 10 feet on uneven surface  activity   Assist Walk 10 feet on uneven surfaces activity did not occur: Safety/medical concerns (due to patient fatigue/weakness)         Wheelchair     Assist Will patient use wheelchair at discharge?: Yes Type of Wheelchair: Manual     Wheelchair assist level: Dependent - Patient 0% (TIS w/c) Max wheelchair distance: 150    Wheelchair 50 feet with 2 turns activity    Assist        Assist Level: Dependent - Patient 0%   Wheelchair 150 feet activity     Assist      Assist Level: Dependent - Patient 0%   Blood pressure 120/72, pulse 66, temperature 98.7 F (37.1 C), resp. rate 18, height 5\' 11"  (1.803 m), weight 65.4 kg, SpO2 95 %.    Medical Problem List and Plan: 1. 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               -patient may shower             -ELOS/Goals: 12/2 may need SNF post hospital d/c to complete abx             Continue CIR, PT,  OT, SLP    2.  Antithrombotics: -DVT/anticoagulation:  Pharmaceutical: Other (comment)--Eliquis.              -antiplatelet therapy: N/A--has been off ASA/Brilinta.  3. Chronic HA/Cervicalgia/Pain Management: Was on flexeril prn PTA. Will monitor N/A. Will order kpad and cervical spine XR given new neck pain and TTP on cervical spine. 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             Monitor with increased exertion  11/22- denies pain specifically when asked this AM- con't regimen 4. Mood: LCSW to follow for evaluation and support.              -antipsychotic agents: Monitor for now.  5. Neuropsych: This patient is not capable of making decisions on his own behalf. 6. Skin/Wound Care: Routine pressure relief measures. Protein supplement due to evidence of malnutrition.  7. Fluids/Electrolytes/Nutrition: Monitor I/Os.              CMP 11/11 is stable.  8. Gastroenteritis: Had multiple episodes of N/V on 11/09 with 4 incontinent stools and low grade fever. Monitor for GI virus. 9. HTN:  Monitor BP tid--educate on importance of compliance. SBP goal 130-150   Vitals:   03/17/20 0403 03/17/20 1358  BP: (!) 155/71 120/72  Pulse: (!) 54 66  Resp: 17 18  Temp: 98.7 F (37.1 C)   SpO2: 100% 95%  BPs stabilizing  10. ICM/Heart failure with persistent apical thrombus: Monitor for signs and symptoms of fluid overload. Daily weights. Continue Eliquis.  11. A fib: Monitor HR tid--continue Lanoxin and Eliquis.              Monitor with increased activity 12. Acute  on chronic renal failure: Likely due to gastroenteritis. SCr 1.55-->1.3-->1.53.   Will recheck lytes in am.              Cr trending down on 11/11  11/15- Cr down to 1.1- con't to monitor  11/22- Cr doing overall well, but BUN up to 36 and Cr 1.21- slightly worse than last time- will give 1L of IVFs and recheck labs, if he will let us.  13. Electrolytes abnormalities: Resolved post supplementation--recheck magnesium/potassium levels in am.  14. Anemia: Recheck iron levels.                    CBC ordered for tomorrow a.m. 15. T2DM: Hgb A1C-6.8.  probable neuropathy trial gabapentin low dose   Will monitor BS ac/hs. Discontinue metformin due to CKD. Add CM restrictions to diet.              Monitor with increased mobility CBG (last 3)  Recent Labs    03/17/20 0606 03/17/20 1218 03/17/20 1649  GLUCAP 121* 181* 160*   11/22- BGs variable, based on whether he eats- con't SSI SSI  16. Aspiration Pneumonitis: no signs of PNA, already on IV Cefazolin, d/c augmentin, neg CXR, nl WBCs 17. Dysphagia: Continue CIR SLP 18. MSSA bacteremia  TEE showed no valve vegetations, no other sig structural abnl in report cardiology to f/u with recs Per ID will need IV cefazolin q 8h, to D/C on 04/05/20.  Will need midline placed if pt leaves hospital setting prior to this date 19.  COgnitive deficits from CVA +/- encephalopathy from bacteremia, may need to add methylphenidate will discuss with team   11/22- very sedated- hard to wake  up.    LOS: 12 days A FACE TO FACE EVALUATION WAS PERFORMED  [name] 03/17/2020, 7:37 PM

## 2020-03-17 NOTE — Plan of Care (Signed)
Problem: RH Balance Goal: LTG Patient will maintain dynamic sitting balance (PT) Description: LTG:  Patient will maintain dynamic sitting balance with assistance during mobility activities (PT) Flowsheets (Taken 03/17/2020 1235) LTG: Pt will maintain dynamic sitting balance during mobility activities with:: (downgraded due to patient progress) Contact Guard/Touching assist Note: downgraded due to patient progress Goal: LTG Patient will maintain dynamic standing balance (PT) Description: LTG:  Patient will maintain dynamic standing balance with assistance during mobility activities (PT) Flowsheets (Taken 03/17/2020 1235) LTG: Pt will maintain dynamic standing balance during mobility activities with:: (downgraded due to patient progress) Contact Guard/Touching assist Note: downgraded due to patient progress   Problem: Sit to Stand Goal: LTG:  Patient will perform sit to stand with assistance level (PT) Description: LTG:  Patient will perform sit to stand with assistance level (PT) Flowsheets (Taken 03/17/2020 1235) LTG: PT will perform sit to stand in preparation for functional mobility with assistance level: (downgraded due to patient progress) Minimal Assistance - Patient > 75% Note: downgraded due to patient progress   Problem: RH Bed Mobility Goal: LTG Patient will perform bed mobility with assist (PT) Description: LTG: Patient will perform bed mobility with assistance, with/without cues (PT). Flowsheets (Taken 03/17/2020 1235) LTG: Pt will perform bed mobility with assistance level of: (downgraded due to patient progress) Contact Guard/Touching assist Note: downgraded due to patient progress   Problem: RH Bed to Chair Transfers Goal: LTG Patient will perform bed/chair transfers w/assist (PT) Description: LTG: Patient will perform bed to chair transfers with assistance (PT). Flowsheets (Taken 03/17/2020 1235) LTG: Pt will perform Bed to Chair Transfers with assistance level:  (downgraded due to patient progress) Minimal Assistance - Patient > 75% Note: downgraded due to patient progress   Problem: RH Car Transfers Goal: LTG Patient will perform car transfers with assist (PT) Description: LTG: Patient will perform car transfers with assistance (PT). Flowsheets (Taken 03/17/2020 1235) LTG: Pt will perform car transfers with assist:: (downgraded due to patient progress) Minimal Assistance - Patient > 75% Note: downgraded due to patient progress   Problem: RH Furniture Transfers Goal: LTG Patient will perform furniture transfers w/assist (OT/PT) Description: LTG: Patient will perform furniture transfers  with assistance (OT/PT). Flowsheets (Taken 03/17/2020 1235) LTG: Pt will perform furniture transfers with assist:: (downgraded due to patient progress) Minimal Assistance - Patient > 75% Note: downgraded due to patient progress   Problem: RH Ambulation Goal: LTG Patient will ambulate in controlled environment (PT) Description: LTG: Patient will ambulate in a controlled environment, # of feet with assistance (PT). Flowsheets (Taken 03/17/2020 1235) LTG: Pt will ambulate in controlled environ  assist needed:: (downgraded due to patient progress) Minimal Assistance - Patient > 75% LTG: Ambulation distance in controlled environment: 100 Note: downgraded due to patient progress Goal: LTG Patient will ambulate in home environment (PT) Description: LTG: Patient will ambulate in home environment, # of feet with assistance (PT). Flowsheets (Taken 03/17/2020 1235) LTG: Pt will ambulate in home environ  assist needed:: Minimal Assistance - Patient > 75% Note: downgraded due to patient progress   Problem: RH Wheelchair Mobility Goal: LTG Patient will propel w/c in controlled environment (PT) Description: LTG: Patient will propel wheelchair in controlled environment, # of feet with assist (PT) Flowsheets (Taken 03/17/2020 1235) LTG: Pt will propel w/c in controlled  environ  assist needed:: (downgraded due to patient progress) Moderate Assistance - Patient 50 - 74% Note: downgraded due to patient progress Goal: LTG Patient will propel w/c in home environment (PT) Description: LTG: Patient will  propel wheelchair in home environment, # of feet with assistance (PT). Flowsheets (Taken 03/17/2020 1235) LTG: Pt will propel w/c in home environ  assist needed:: (downgraded due to patient progress) Moderate Assistance - Patient 50 - 74% Note: downgraded due to patient progress   Problem: RH Stairs Goal: LTG Patient will ambulate up and down stairs w/assist (PT) Description: LTG: Patient will ambulate up and down # of stairs with assistance (PT) Flowsheets (Taken 03/17/2020 1235) LTG: Pt will ambulate up/down stairs assist needed:: (downgraded due to patient progress) Moderate Assistance - Patient 50 - 74% Note: downgraded due to patient progress

## 2020-03-17 NOTE — Progress Notes (Signed)
Called to room pt refuses labs, explained need for labs and pt still refused.

## 2020-03-17 NOTE — Plan of Care (Signed)
  Problem: RH Expression Communication Goal: LTG Patient will increase speech intelligibility (SLP) Description: LTG: Patient will increase speech intelligibility at word/phrase/conversation level with cues, % of the time (SLP) Flowsheets (Taken 03/17/2020 1219) LTG: Patient will increase speech intelligibility (SLP): Moderate Assistance - Patient 50 - 74% Level: Conversation level Percent of time patient will use intelligible speech: 80 Note: Downgraded due to slower than anticipated progress, and change in d/c plan which sends him to SNF earlier than originally anticipated   Problem: RH Memory Goal: LTG Patient will demonstrate ability for day to day (SLP) Description: LTG:   Patient will demonstrate ability for day to day recall/carryover during cognitive/linguistic activities with assist  (SLP) Flowsheets (Taken 03/17/2020 1219) LTG: Patient will demonstrate ability for day to day recall/carryover during cognitive/linguistic activities with assist (SLP): Moderate Assistance - Patient 50 - 74% Note: Downgraded due to slower than anticipated progress, and change in d/c plan which sends him to SNF earlier than originally anticipated   Problem: RH Attention Goal: LTG Patient will demonstrate this level of attention during functional activites (SLP) Description: LTG:  Patient will will demonstrate this level of attention during functional activites (SLP) Flowsheets (Taken 03/17/2020 1219) LTG: Patient will demonstrate this level of attention during cognitive/linguistic activities with assistance of (SLP): Minimal Assistance - Patient > 75% Number of minutes patient will demonstrate attention during cognitive/linguistic activities: 10 Note: Downgraded due to slower than anticipated progress, and change in d/c plan which sends him to SNF earlier than originally anticipated   Problem: RH Awareness Goal: LTG: Patient will demonstrate awareness during functional activites type of  (SLP) Description: LTG: Patient will demonstrate awareness during functional activites type of (SLP) Flowsheets (Taken 03/17/2020 1219) LTG: Patient will demonstrate awareness during cognitive/linguistic activities with assistance of (SLP): Moderate Assistance - Patient 50 - 74% Note: Downgraded due to slower than anticipated progress, and change in d/c plan which sends him to SNF earlier than originally anticipated

## 2020-03-17 NOTE — Progress Notes (Signed)
Occupational Therapy Session Note  Patient Details  Name: [name] MRN: [id number] Date of Birth: April 14, 1941  Today's Date: 03/17/2020 OT Individual Time: 1120-1202 OT Individual Time Calculation (min): 42 min    Short Term Goals: Week 2:  OT Short Term Goal 1 (Week 2): Pt will complete LB dressing with mod assist sit to stand. OT Short Term Goal 2 (Week 2): Pt will use the LUE with mod assist for washing the RUE. OT Short Term Goal 3 (Week 2): Pt will complete UB dressing with min assist for pullover shirt for two sessions. OT Short Term Goal 4 (Week 2): Pt will complete toilet transfers with min assist using the RW for support. OT Short Term Goal 5 (Week 2): Pt will completed bathing tasks with no more than mod instructional cueing for initiation and completion of bathing tasks.  Skilled Therapeutic Interventions/Progress Updates:    Pt in bed to start session.  He was agreeable to participate in OT this am and work on Civil engineer, contracting tasks.  He completed supine to sit EOB with min assist.  Stand pivot transfer was completed to the wheelchair with mod assist.  He was positioned at the sink for work on bathing with mod assist needed for removal of pullover shirt following hemi techniques.  He was able to complete UB bathing with mod instructional cueing and overall min assist.  Max hand over hand assistance was needed to complete washing of the RUE with use of the LUE.  He integrated the LUE as well for stabilizing the soap and the deodorant with max assist as well.  He stood with mod assist for washing his buttocks and peri area as well, immediately reporting that he needed to go the bathroom while standing.  Mod instructional cueing with mod assist to pull up pants before attempting to ambulate to the bathroom.  Mod assist was needed for ambulation to the bathroom without assistive device and for completion of clothing management and toilet hygiene.  He was able to complete hand washing at the sink  after toileting with mod assist in sitting and then also donned his pullover shirt with mod assist to conclude session.  He was initially not oriented to the day of the week and was not able to recall the day of the month when told approximately 20 mins earlier during session.  Finished session with call button and phone in reach and safety belt in place.  Pt continues to exhibit difficulty turning on the TV as he continues to press the button and turn it right off as well as push the call button when he is trying to turn it on.  Decreased memory and awareness of this when shown.   Therapy Documentation Precautions:  Precautions Precautions: Fall, Other (comment) Precaution Comments: L hemi, L inattention Restrictions Weight Bearing Restrictions: No   Pain: See Pain Flowsheet for details  ADL: See Care Tool Section for some details of mobility and selfcare  Therapy/Group: Individual Therapy  Devan Babino,[place] OTR/L 03/17/2020, 4:07 PM

## 2020-03-17 NOTE — Progress Notes (Signed)
Physical Therapy Session Note  Patient Details  Name: [name] MRN: [id number] Date of Birth: 18-Jul-1940  Today's Date: 03/17/2020 PT Individual Time: 0900-0945 PT Individual Time Calculation (min): 45 min   Short Term Goals: Week 2:  PT Short Term Goal 1 (Week 2): Pt will perform supine<>sit with min assist PT Short Term Goal 2 (Week 2): Pt will perform sit<>stands using LRAD with min assist PT Short Term Goal 3 (Week 2): Pt will perform bed<>chair transfers using LRAD with min assist PT Short Term Goal 4 (Week 2): Pt will ambulate at least 41ft using LRAD with min assist of 1  Skilled Therapeutic Interventions/Progress Updates:    Patient received supine in bed, RN at bedside, agreeable to PT. He continues to report pain "all over" but does not provide numerical rating. PT donning TED hose, pants and shoes in bed with MaxA. ModA with verbal cues for sequencing supine >sit edge of bed. ModA for stand pivot to wc. PT propelling patient in wc to therapy gym for time management. He was able to ambulate 36ft with RW + modified hand grip and ModA. Wc follow for convenience. He displays L lateral lean and pushing with R UE on RW, but demonstrates greater postural stability when he has RW available. Patient able to ambulate additional 63ft with RW and MinA with consistent verbal cues for midline posture. Patient requesting to return to room to read the newspaper. He was initially agreeable to remain up in wc to read, but once back in the room, he became increasingly frustrated requesting to be "put back into bed." PT educating patient on importance of remaining up in the chair, and patient seemed receptive to this education, agreeable to staying up in chair "for a little bit." Seatbelt alarm on, call light within reach, RN aware of patients positioning.   Therapy Documentation Precautions:  Precautions Precautions: Fall, Other (comment) (aspiration precautions) Precaution Comments: L hemi, L  inattention Restrictions Weight Bearing Restrictions: No    Therapy/Group: Individual Therapy  Elizebeth Koller, PT, DPT, CBIS  03/17/2020, 7:35 AM

## 2020-03-18 ENCOUNTER — Inpatient Hospital Stay (HOSPITAL_COMMUNITY): Payer: Medicare PPO | Admitting: Occupational Therapy

## 2020-03-18 ENCOUNTER — Inpatient Hospital Stay (HOSPITAL_COMMUNITY): Payer: Medicare PPO | Admitting: Physical Therapy

## 2020-03-18 ENCOUNTER — Encounter (HOSPITAL_COMMUNITY): Payer: Medicare PPO | Admitting: Speech Pathology

## 2020-03-18 ENCOUNTER — Inpatient Hospital Stay (HOSPITAL_COMMUNITY): Payer: Medicare PPO

## 2020-03-18 LAB — GLUCOSE, CAPILLARY
Glucose-Capillary: 102 mg/dL — ABNORMAL HIGH (ref 70–99)
Glucose-Capillary: 254 mg/dL — ABNORMAL HIGH (ref 70–99)
Glucose-Capillary: 131 mg/dL — ABNORMAL HIGH (ref 70–99)
Glucose-Capillary: 182 mg/dL — ABNORMAL HIGH (ref 70–99)

## 2020-03-18 MED ORDER — PROSOURCE PLUS PO LIQD
30.0000 mL | Freq: Two times a day (BID) | ORAL | Status: DC
  Administered 2020-03-18 – 2020-04-14 (×46): 30 mL via ORAL
  Filled 2020-03-18 (×37): qty 30

## 2020-03-18 MED ORDER — ENSURE ENLIVE PO LIQD
237.0000 mL | Freq: Three times a day (TID) | ORAL | Status: DC
  Administered 2020-03-18 – 2020-04-13 (×51): 237 mL via ORAL

## 2020-03-18 NOTE — Progress Notes (Signed)
Occupational Therapy Session Note  Patient Details  Name: [name] MRN: [id number] Date of Birth: 1940-09-21  Today's Date: 03/18/2020 OT Individual Time: 2552-5894 OT Individual Time Calculation (min): 28 min    Short Term Goals: Week 2:  OT Short Term Goal 1 (Week 2): Pt will complete LB dressing with mod assist sit to stand. OT Short Term Goal 2 (Week 2): Pt will use the LUE with mod assist for washing the RUE. OT Short Term Goal 3 (Week 2): Pt will complete UB dressing with min assist for pullover shirt for two sessions. OT Short Term Goal 4 (Week 2): Pt will complete toilet transfers with min assist using the RW for support. OT Short Term Goal 5 (Week 2): Pt will completed bathing tasks with no more than mod instructional cueing for initiation and completion of bathing tasks.  Skilled Therapeutic Interventions/Progress Updates:     Pt received in TIS w/c with seatbelt alarm engagement. This session focused on toileting/toilet transfer. Pt stated he had to use the bathroom at beginning of session. Performed stand-pivot transfer from TIS w/c to [facility] x2 in bathroom with max A to shift weight from R LE to L LE. Pt did not void this session but performed posterior peri care with CGA d/t decreased dynamic sitting balance. Cont to perseverate on retrieving copious amounts of toilet paper from his R side when not needed. Completed sit to stand from [facility] with max A to pull up brief and pants. Pt washed hands at sink while seated in TIS w/c with mod A + tactile + verbal cuing to wash/incorporate L hand. Additionally perseverated on wiping sink down after already cleaning it 1x. Finally, completed stand-pivot t/f from TIS to bed with max A to shift weight from R LE to L LE. C/o of headache and did not recall receiving his pain medication earlier; RN confirmed that pt received pain medication prior to session at lunch. Pt left semi-reclined in bed with L UE supported on pillow, bed alarm engaged, call  bell in reach, and all immediate needs met.  Therapy Documentation Precautions:  Precautions Precautions: Fall Precaution Comments: L hemi, L inattention Restrictions Weight Bearing Restrictions: No  Pain: Pain Assessment Faces Pain Scale: No hurt   ADL: See Care Tool for more details.  Therapy/Group: Individual Therapy  Brendt Dible,Jerrett OTR/L 03/18/2020, 4:34 PM

## 2020-03-18 NOTE — Progress Notes (Signed)
Occupational Therapy Session Note  Patient Details  Name: [name] MRN: [id number] Date of Birth: 1940/08/21  Today's Date: 03/18/2020 OT Individual Time: 3244-0102 OT Individual Time Calculation (min): 57 min    Short Term Goals: Week 2:  OT Short Term Goal 1 (Week 2): Pt will complete LB dressing with mod assist sit to stand. OT Short Term Goal 2 (Week 2): Pt will use the LUE with mod assist for washing the RUE. OT Short Term Goal 3 (Week 2): Pt will complete UB dressing with min assist for pullover shirt for two sessions. OT Short Term Goal 4 (Week 2): Pt will complete toilet transfers with min assist using the RW for support. OT Short Term Goal 5 (Week 2): Pt will completed bathing tasks with no more than mod instructional cueing for initiation and completion of bathing tasks.  Skilled Therapeutic Interventions/Progress Updates:    Pt in bed to start session with his nephew present giving him a haircut.  He was agreeable to therapy and completed supine to sit with overall max assist.  Once on the EOB he exhibits increased lean to the left slightly but statically can maintain sitting balance with min guard.  He was able to work on UB and LB dressing sit to stand.  Max assist was needed for donning his pullover shirt and he needed max assist for donning his pants, gripper socks, and shoes.  He then completed stand pivot transfer to the wheelchair with max assist and was transported down to the therapy gym.  Rest of session focused on squat pivot transfers and scooting EOM.  He was able to scoot to the right with mod assist secondary to pushing to the left.  He was able to scoot to the left with overall min assist for several repetitions.  Mod demonstrational cueing needed for forward and right weightshift with each transition to help decrease pusher tendencies.  Finished session with transfer back to the wheelchair stand pivot with max assist to complete session.  Pt was left sitting up in  the wheelchair with the call button and phone in reach and safety belt in place.    Therapy Documentation Precautions:  Precautions Precautions: Fall, Other (comment) Precaution Comments: L hemi, L inattention Restrictions Weight Bearing Restrictions: No  Pain: Pain Assessment Pain Scale: 0-10 Pain Score: 9  Faces Pain Scale: No hurt Pain Type: Acute pain Pain Location: Head Pain Descriptors / Indicators: Headache Pain Frequency: Occasional Pain Onset: Gradual Pain Intervention(s): Medication (See eMAR) ADL: See Care Tool Section for some details of mobility and selfcare  Therapy/Group: Individual Therapy  Kameria Canizares,Worthy OTR/L 03/18/2020, 3:57 PM

## 2020-03-18 NOTE — Progress Notes (Signed)
Modified Barium Swallow Progress Note  Patient Details  Name: [name] MRN: [id number] Date of Birth: [birth year]  Today's Date: 03/18/2020  Modified Barium Swallow completed.  Full report located under Chart Review in the Imaging Section.  Brief recommendations include the following:  Clinical Impression  Pt demonstrated mild primarily oral dysphagia today, with significant improvements in oropharyngeal swallow function in comparison to previous MBSS 03/01/20. Although he tends to exhibit piecemeal swallowing of most textures and mild oral holding of thin barium occurred X1 as he began to fatigue, pt is better able to control liquid boluses, swallow initiation occurs in a timely manner, and laryngeal closure provides adequate airway protection. Several single and serial sips of thin barium were trialed via cup and straw, without any penetration or aspiration. He also consumed a barium pill whole with thin without difficulty. Mastication of Dysphagia 3 (mechanical soft) textures was prolonged and decreased bolus cohesion noted, however no airway intrusion noted. Due to fluctuations in cognition and arousal which influence frequency of oral holding, recommend continue Dys 1 (puree) textures for now with advanced solid trials to occur at bedside with ST, upgrade to thin liquids, medications may be administered whole in purees. Pt should still have full supervision during PO intake to ensure optimal positioning and use of compensatory swallow strategies. ST will continue to provide skilled interventions to increase pt's independence with use of safe swallow strategies and work toward solid advancement.   Swallow Evaluation Recommendations       SLP Diet Recommendations: Dysphagia 1 (Puree) solids;Thin liquid   Liquid Administration via: Cup;Straw   Medication Administration: Whole meds with puree   Supervision: Full supervision/cueing for compensatory strategies;Patient able to self feed    Compensations: Slow rate;Small sips/bites;Lingual sweep for clearance of pocketing;Other (Comment) (check for oral holding)   Postural Changes: Seated upright at 90 degrees   Oral Care Recommendations: Oral care BID        [name] 03/18/2020,10:21 AM

## 2020-03-18 NOTE — Progress Notes (Signed)
Physical Therapy Session Note  Patient Details  Name: [name] MRN: [id number] Date of Birth: [birth year]  Today's Date: 03/18/2020 PT Individual Time: 8295-6213 PT Individual Time Calculation (min): 55 min   Short Term Goals: Week 2:  PT Short Term Goal 1 (Week 2): Pt will perform supine<>sit with min assist PT Short Term Goal 2 (Week 2): Pt will perform sit<>stands using LRAD with min assist PT Short Term Goal 3 (Week 2): Pt will perform bed<>chair transfers using LRAD with min assist PT Short Term Goal 4 (Week 2): Pt will ambulate at least 67ft using LRAD with min assist of 1  Skilled Therapeutic Interventions/Progress Updates:    Pt received sitting in w/c stating "I need to get some paperwork out of my bag" and despite questioning pt unable to clarify what he was referencing. Pt not oriented to location or situation and presents with increased irritability and increased impulsivity today stating phrases such as  "I need to get out of here" and "help me please" while attempting to get up from w/c despite having seat belt alarm on and being tilted backwards in TIS w/c. Therapist provided repeated, gentle redirection throughout session to encourage pt to participate and he was agreeable. Pt noted to have spilt tea on his pants and with encouragement agreeable to change into clean clothing. Sit>stand w/c>sink with B UE support and mod assist for lifting into standing and balance due to L lean - facilitation for increased L LE hip/knee extension during transition. Standing with B UE support on sink and min/CGA for steadying doffed LB clothing total assist. While standing with his pants around his knees pt suddenly reaches and initiates movement towards the bathroom door stating need to use bathroom - with cuing and education on safety awarness pt able to be redirected to sit in w/c. Transported in/out bathroom in TIS w/c. Stand pivot w/c<>toilet using UE support on grab bar with mod assist for  lifting to stand and balance while turning (especially towards L due to inattention and poor awareness of w/c location) - cuing and facilitation for weight shifting during transfer to initiate a step. Continent of BM and performed seated peri-care with supervision as pt continues to perseverate on collecting an excessive amount of toilet paper with decreased awareness that it will clog the plumbing. Stead hand hygiene at sink with pt continuing to demonstrate significant L inattention requiring total assist for LUE incorporation into task despite cuing.  Transported to/from gym in w/c for time management and energy conservation. Gait training ~124ft, ~32ft using RW with mod assist of 1 for balance and AD management - pt continues to demo L lateral lean with poor midline orientation resulting in poor balance and lack of adequate R weight shift to allow L LE swing phase advancement - cuing and manual facilitation for improvement with minimal carryover - pt also demos increased trunk flexion. Pt continues to demo significantly impaired awareness of deficits stating "I can walk" despite having just required assistance to ambulate - attempted to educate pt but limited receptiveness due to increased irritability today. Pt reports fatigue and requires significant, gentle encouragement to continue with session. Standing, no UE support, with mod assist for balance due to L lean while participating in horseshoe game focusing on promoting increased R lean/weight shift, standing tolerance, and dynamic standing balance. Transported back to room and with encouragement pt agreeable to remain seated in TIS w/c until next session - left tilted backwards for pressure relief, needs in reach, and  seat belt alarm on.  Therapy Documentation Precautions:  Precautions Precautions: Fall, Other (comment) Precaution Comments: L hemi, L inattention Restrictions Weight Bearing Restrictions: No  Pain: Reports that he "hurts all over"  - RN notified and reports pre-medicated - provided rest breaks, repositioning, distractions, and emotional support for pain management during session.  Therapy/Group: Individual Therapy  Ginny Forth , PT, DPT, CSRS  03/18/2020, 12:27 PM

## 2020-03-18 NOTE — Progress Notes (Signed)
[facility] PHYSICAL MEDICINE & REHABILITATION PROGRESS NOTE   Subjective/Complaints:  IVF last noc for elevated BUN  ROS: unable to assess due to sedation  Objective:   No results found. Recent Labs    03/17/20 1820  WBC 7.5  HGB 9.7*  HCT 29.9*  PLT 301   Recent Labs    03/17/20 1708  NA 138  K 4.4  CL 107  CO2 23  GLUCOSE 169*  BUN 36*  CREATININE 1.21  CALCIUM 8.7*    Intake/Output Summary (Last 24 hours) at 03/18/2020 0831 Last data filed at 03/17/2020 1700 Gross per 24 hour  Intake 360 ml  Output --  Net 360 ml        Physical Exam: Vital Signs Blood pressure (!) 143/76, pulse (!) 47, temperature 98.1 F (36.7 C), resp. rate 15, height 5\' 11"  (1.803 m), weight 66.8 kg, SpO2 100 %.   General: No acute distress Mood and affect are appropriate Heart: Regular rate and rhythm no rubs murmurs or extra sounds Lungs: Clear to auscultation, breathing unlabored, no rales or wheezes Abdomen: Positive bowel sounds, soft nontender to palpation, nondistended Extremities: No clubbing, cyanosis, or edema Skin: No evidence of breakdown, no evidence of rash  Neurologic: Answers yes to most questions  motor strength is 5/5 in RIght and 3/5 Left deltoid, bicep, tricep, grip, 4/5 Left hip flexor, knee extensors, 3/5 left ankle dorsiflexor and plantar flexor  Musculoskeletal: No pain with range of motion, no joint swelling      Assessment/Plan: 1. Functional deficits which require 3+ hours per day of interdisciplinary therapy in a comprehensive inpatient rehab setting.  Physiatrist is providing close team supervision and 24 hour management of active medical problems listed below.  Physiatrist and rehab team continue to assess barriers to discharge/monitor patient progress toward functional and medical goals  Care Tool:  Bathing    Body parts bathed by patient: Chest, Abdomen, Left arm, Front perineal area, Buttocks, Face   Body parts bathed by helper:  Right arm Body parts n/a: Right upper leg, Left upper leg, Right lower leg, Left lower leg (did not attempt secondary to time)   Bathing assist Assist Level: Minimal Assistance - Patient > 75%     Upper Body Dressing/Undressing Upper body dressing   What is the patient wearing?: Pull over shirt    Upper body assist Assist Level: Moderate Assistance - Patient 50 - 74%    Lower Body Dressing/Undressing Lower body dressing      What is the patient wearing?: Pants, Incontinence brief     Lower body assist Assist for lower body dressing: Maximal Assistance - Patient 25 - 49%     Toileting Toileting    Toileting assist Assist for toileting: Moderate Assistance - Patient 50 - 74%     Transfers Chair/bed transfer  Transfers assist     Chair/bed transfer assist level: Moderate Assistance - Patient 50 - 74%     Locomotion Ambulation   Ambulation assist      Assist level: Moderate Assistance - Patient 50 - 74% Assistive device: Hand held assist Max distance: 10'   Walk 10 feet activity   Assist     Assist level: Moderate Assistance - Patient - 50 - 74% Assistive device: Walker-rolling   Walk 50 feet activity   Assist Walk 50 feet with 2 turns activity did not occur: Safety/medical concerns (due to patient fatigue/weakness)  Assist level: Moderate Assistance - Patient - 50 - 74% Assistive device: Walker-rolling  Walk 150 feet activity   Assist Walk 150 feet activity did not occur: Safety/medical concerns (due to patient fatigue/weakness)         Walk 10 feet on uneven surface  activity   Assist Walk 10 feet on uneven surfaces activity did not occur: Safety/medical concerns (due to patient fatigue/weakness)         Wheelchair     Assist Will patient use wheelchair at discharge?: Yes Type of Wheelchair: Manual    Wheelchair assist level: Dependent - Patient 0% (TIS w/c) Max wheelchair distance: 150    Wheelchair 50 feet with 2  turns activity    Assist        Assist Level: Dependent - Patient 0%   Wheelchair 150 feet activity     Assist      Assist Level: Dependent - Patient 0%   Blood pressure (!) 143/76, pulse (!) 47, temperature 98.1 F (36.7 C), resp. rate 15, height 5\' 11"  (1.803 m), weight 66.8 kg, SpO2 100 %.    Medical Problem List and Plan: 1. 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               -patient may shower             -ELOS/Goals: 12/2 may need SNF post hospital d/c to complete abx             Continue CIR, PT,  OT, SLP    2.  Antithrombotics: -DVT/anticoagulation:  Pharmaceutical: Other (comment)--Eliquis.              -antiplatelet therapy: N/A--has been off ASA/Brilinta.  3. Chronic HA/Cervicalgia/Pain Management: Was on flexeril prn PTA. Will monitor N/A. Will order kpad and cervical spine XR given new neck pain and TTP on cervical spine. 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             Monitor with increased exertion  11/22- denies pain specifically when asked this AM- con't regimen 4. Mood: LCSW to follow for evaluation and support.              -antipsychotic agents: Monitor for now.  5. Neuropsych: This patient is not capable of making decisions on his own behalf. 6. Skin/Wound Care: Routine pressure relief measures. Protein supplement due to evidence of malnutrition.  7. Fluids/Electrolytes/Nutrition: Monitor I/Os.              CMP 11/11 is stable.  8. Gastroenteritis: Had multiple episodes of N/V on 11/09 with 4 incontinent stools and low grade fever. Monitor for GI virus. 9. HTN: Monitor BP tid--educate on importance of compliance. SBP goal 130-150   Vitals:   03/17/20 1957 03/18/20 0412  BP:  (!) 112/53 (!) 143/76  Pulse: 62 (!) 47  Resp: 16 15  Temp: 98.5 F (36.9 C) 98.1 F (36.7 C)  SpO2: 100% 100%  BPs controlled  10. ICM/Heart failure with persistent apical thrombus: Monitor for signs and symptoms of fluid overload. Daily weights. Continue Eliquis.  11. A fib: Monitor HR tid--continue Lanoxin and Eliquis.              Monitor with increased activity 12. Acute on chronic renal failure: Likely due to gastroenteritis. SCr 1.55-->1.3-->1.53.   Will recheck lytes in am.  Cr trending down on 11/11  11/15- Cr down to 1.1- con't to monitor  11/22- Cr doing overall well, but BUN up to 36 and Cr 1.21- slightly worse than last time- s/p IVFx 1L 13. Electrolytes abnormalities: Resolved post supplementation--recheck magnesium/potassium levels in am.  14. Anemia: Recheck iron levels.                    CBC ordered for tomorrow a.m. 15. T2DM: Hgb A1C-6.8.  probable neuropathy trial gabapentin low dose   Will monitor BS ac/hs.Add CM restrictions to diet.              Monitor with increased mobility CBG (last 3)  Recent Labs    03/17/20 1649 03/17/20 2125 03/18/20 0602  GLUCAP 160* 225* 102*  some lability hold off on restarting metformin due to elevated creat 11/22- BGs variable, based on whether he eats- con't SSI SSI  16. Aspiration Pneumonitis: no signs of PNA, already on IV Cefazolin, d/c augmentin, neg CXR, nl WBCs 17. Dysphagia: Continue CIR SLP 18. MSSA bacteremia  TEE showed no valve vegetations, no other sig structural abnl in report cardiology to f/u with recs Per ID will need IV cefazolin q 8h, to D/C on 04/05/20.  Will need midline placed if pt leaves hospital setting prior to this date- daughter would like pt to go to [place], may need SNF placement to complete abx in [place] area, so pt does not miss abx dose in transit 19.  COgnitive deficits from CVA +/- encephalopathy from bacteremia, may need to add methylphenidate will discuss with team   11/22-  very sedated- hard to wake up.    LOS: 13 days A FACE TO FACE EVALUATION WAS PERFORMED  Erick Colace 03/18/2020, 8:31 AM

## 2020-03-18 NOTE — Progress Notes (Signed)
Nutrition Follow-up  RD working remotely.  DOCUMENTATION CODES:   Severe malnutrition in context of chronic illness  INTERVENTION:   -Ensure Enlive po TID, each supplement provides 350 kcal and 20 grams of protein  - ProSource Plus 30 ml po BID, each supplement provides 100 kcal and 15 grams of protein  - MagicCup BID with meals, each supplement provides 290 kcal and 9 grams of protein  - Continue Juven BID to aid in wound healing  NUTRITION DIAGNOSIS:   Severe Malnutrition related to chronic illness (COPD, CVA) as evidenced by severe muscle depletion, severe fat depletion.  Ongoing  GOAL:   Patient will meet greater than or equal to 90% of their needs  Progressing  MONITOR:   PO intake, Supplement acceptance, Diet advancement, Labs, Weight trends, Skin  REASON FOR ASSESSMENT:   Consult Diet education  ASSESSMENT:   79 year old male with PMH of T2DM, HTN, CAD s/p CABG in 2020, ICM, cardioembolic CVA, COPD who was admitted on 02/28/20 after being found on the floor with left hemiparesis, facial droop, and multiple abrasions. 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 Hospital course further complicated by post-stroke dysphagia. Pt started on a dysphagia 1 diet with nectar-thick liquids. Admitted to CIR on 11/10.  11/23 - MBS, diet advanced to dysphagia 1 with thin liquids  Unable to reach pt via phone call to room. PO intake remains adequate with most meal completions charted as >/= 95%. Now that pt has been advanced to thin liquids, RD will order Ensure Enlive for pt to have between meals to aid in meeting kcal and protein needs. Will also order ProSource Plus.  CIR admit weight: 65.4 kg Current weight: 66.8 kg  Meal Completion: 60-100% x last 6  meals  Medications reviewed and include: SSI, Juven BID, vitamin C 250 mg daily, IV abx  Labs reviewed: BUN 36, hemoglobin 9.7 CBG's: 102-254 x 24 hours  Diet Order:   Diet Order            DIET - DYS 1 Room service appropriate? Yes; Fluid consistency: Thin  Diet effective now                 EDUCATION NEEDS:   Not appropriate for education at this time  Skin:  Skin Assessment: Skin Integrity Issues: Incisions: face Other: non-pressure wounds to bilateral knees, non-pressure wound to left shoulder  Last BM:  03/18/20  Height:   Ht Readings from Last 1 Encounters:  03/10/20 5\' 11"  (1.803 m)    Weight:   Wt Readings from Last 1 Encounters:  03/18/20 66.8 kg    BMI:  Body mass index is 20.54 kg/m.  Estimated Nutritional Needs:   Kcal:  1800-2000  Protein:  85-100 grams  Fluid:  1.8-2.0 L    03/20/20, MS, RD, LDN Inpatient Clinical Dietitian Please see [facility] for contact information.

## 2020-03-18 NOTE — Progress Notes (Signed)
Patient ID: [name], male   DOB: November 19, 1940, 79 y.o.   MRN: [id number]   Patient out of network authorization started. SW faxing clinicals over. Case number: [id number]. Will follow up at [phone]

## 2020-03-19 ENCOUNTER — Inpatient Hospital Stay (HOSPITAL_COMMUNITY): Payer: Medicare PPO

## 2020-03-19 ENCOUNTER — Inpatient Hospital Stay (HOSPITAL_COMMUNITY): Payer: Medicare PPO | Admitting: Occupational Therapy

## 2020-03-19 LAB — BASIC METABOLIC PANEL
Anion gap: 9 (ref 5–15)
BUN: 25 mg/dL — ABNORMAL HIGH (ref 8–23)
CO2: 25 mmol/L (ref 22–32)
Calcium: 8.7 mg/dL — ABNORMAL LOW (ref 8.9–10.3)
Chloride: 107 mmol/L (ref 98–111)
Creatinine, Ser: 1.22 mg/dL (ref 0.61–1.24)
GFR, Estimated: >60 mL/min (ref >60)
Glucose, Bld: 152 mg/dL — ABNORMAL HIGH (ref 70–99)
Potassium: 4.4 mmol/L (ref 3.5–5.1)
Sodium: 141 mmol/L (ref 135–145)

## 2020-03-19 LAB — GLUCOSE, CAPILLARY
Glucose-Capillary: 107 mg/dL — ABNORMAL HIGH (ref 70–99)
Glucose-Capillary: 156 mg/dL — ABNORMAL HIGH (ref 70–99)
Glucose-Capillary: 127 mg/dL — ABNORMAL HIGH (ref 70–99)
Glucose-Capillary: 224 mg/dL — ABNORMAL HIGH (ref 70–99)

## 2020-03-19 MED ORDER — SODIUM CHLORIDE 0.9 % IV SOLN
75.0000 mL/h | INTRAVENOUS | Status: DC
  Administered 2020-03-19: 75 mL/h via INTRAVENOUS

## 2020-03-19 MED ORDER — METFORMIN HCL 500 MG PO TABS
500.0000 mg | ORAL_TABLET | Freq: Every day | ORAL | Status: DC
  Administered 2020-03-20 – 2020-04-03 (×15): 500 mg via ORAL
  Filled 2020-03-19 (×15): qty 1

## 2020-03-19 MED ORDER — BIOTENE DRY MOUTH MT LIQD
15.0000 mL | Freq: Three times a day (TID) | OROMUCOSAL | Status: DC
  Administered 2020-03-19 – 2020-04-14 (×86): 15 mL via OROMUCOSAL
  Filled 2020-03-19: qty 473
  Filled 2020-03-19: qty 15

## 2020-03-19 NOTE — Progress Notes (Signed)
Nurse notified physician, Pam, NP of pt lethargy and below average HR. HR taken apically as 52 this am and 56 bpm in the afternoon. Digoxin held. Pt sleeping most of day. BP 119/66, 16resp, 98.3, 100% on RA. No new orders noted.

## 2020-03-19 NOTE — Plan of Care (Signed)
  Problem: RH Wheelchair Mobility Goal: LTG Patient will propel w/c in controlled environment (PT) Description: LTG: Patient will propel wheelchair in controlled environment, # of feet with assist (PT) Outcome: Not Applicable Flowsheets (Taken 03/19/2020 1557) LTG: Pt will propel w/c in controlled environ  assist needed:: (discontinued due to patient in TIS wc and dc to SNF) -- Note: discontinued due to patient in TIS wc and dc to SNF Goal: LTG Patient will propel w/c in home environment (PT) Description: LTG: Patient will propel wheelchair in home environment, # of feet with assistance (PT). Outcome: Not Applicable Flowsheets (Taken 03/19/2020 1557) LTG: Pt will propel w/c in home environ  assist needed:: (discontinued due to patient in TIS wc and dc to SNF) -- Note: discontinued due to patient in TIS wc and dc to SNF

## 2020-03-19 NOTE — Progress Notes (Signed)
Physical Therapy Session Note  Patient Details  Name: [name] MRN: [id number] Date of Birth: [birth year]  Today's Date: 03/19/2020 PT Missed Time: 60 Minutes Missed Time Reason: Increased agitation;Patient unwilling to participate;Patient fatigue  Short Term Goals: Week 2:  PT Short Term Goal 1 (Week 2): Pt will perform supine<>sit with min assist PT Short Term Goal 2 (Week 2): Pt will perform sit<>stands using LRAD with min assist PT Short Term Goal 3 (Week 2): Pt will perform bed<>chair transfers using LRAD with min assist PT Short Term Goal 4 (Week 2): Pt will ambulate at least 47ft using LRAD with min assist of 1  Skilled Therapeutic Interventions/Progress Updates:    Patient received in bed asleep in fetal position. He remained very difficult to rouse even to sternal rub. RN alerted to patients level of consciousness, and is aware. With effort, patient able to open eyes but then stated "If you wake me up, I'll shoot you." Patient not appropriate to participate in therapy with current level of arousal. Patient remaining in bed, bed alarm on, call light within reach.    Therapy Documentation Precautions:  Precautions Precautions: Fall Precaution Comments: L hemi, L inattention Restrictions Weight Bearing Restrictions: No    Therapy/Group: Individual Therapy  Elizebeth Koller, PT, DPT, CBIS  03/19/2020, 7:31 AM

## 2020-03-19 NOTE — Plan of Care (Deleted)
  Problem: RH Tub/Shower Transfers Goal: LTG Patient will perform tub/shower transfers w/assist (OT) Description: LTG: Patient will perform tub/shower transfers with assist, with/without cues using equipment (OT) Outcome: Not Applicable Flowsheets (Taken 03/19/2020 1547) LTG: Pt will perform tub/shower stall transfers with assistance level of: Moderate Assistance - Patient 50 - 74% LTG: Pt will perform tub/shower transfers from: Walk in shower

## 2020-03-19 NOTE — Progress Notes (Signed)
Occupational Therapy Session Note  Patient Details  Name: [name] MRN: [id number] Date of Birth: 04-20-1941  Today's Date: 03/19/2020 OT Individual Time: 1597-3312 OT Individual Time Calculation (min): 37 min  and Today's Date: 03/19/2020 OT Missed Time: 8 Minutes Missed Time Reason: Patient fatigue   Short Term Goals: Week 2:  OT Short Term Goal 1 (Week 2): Pt will complete LB dressing with mod assist sit to stand. OT Short Term Goal 2 (Week 2): Pt will use the LUE with mod assist for washing the RUE. OT Short Term Goal 3 (Week 2): Pt will complete UB dressing with min assist for pullover shirt for two sessions. OT Short Term Goal 4 (Week 2): Pt will complete toilet transfers with min assist using the RW for support. OT Short Term Goal 5 (Week 2): Pt will completed bathing tasks with no more than mod instructional cueing for initiation and completion of bathing tasks.  Skilled Therapeutic Interventions/Progress Updates:    Pt received in TIS w/c, watching TV with no c/o pain. Pt reluctantly agreeable to OT session within room. He completed LUE NMR task- grasping resistive clothespins and reaching forward with mod-max facilitation. Frequent cueing required for L attention. Pt then completed 2 visual scanning activities with line bisection test and letter discrimination/location task- mod cueing for attention to L side of page with attempts to use anchoring. Pt now falling asleep and unable to maintain attention to task. Pt was transferred back to bed, mod A stand pivot. He was left supine with all needs met, bed alarm set.   Therapy Documentation Precautions:  Precautions Precautions: Fall Precaution Comments: L hemi, L inattention Restrictions Weight Bearing Restrictions: No  Therapy/Group: Individual Therapy  Curtis Sites 03/19/2020, 6:34 AM

## 2020-03-19 NOTE — Plan of Care (Signed)
Problem: RH Tub/Shower Transfers Goal: LTG Patient will perform tub/shower transfers w/assist (OT) Description: LTG: Patient will perform tub/shower transfers with assist, with/without cues using equipment (OT) Outcome: Not Applicable Flowsheets (Taken 03/19/2020 1547) LTG: Pt will perform tub/shower stall transfers with assistance level of: Moderate Assistance - Patient 50 - 74% LTG: Pt will perform tub/shower transfers from: Walk in shower   Problem: RH Balance Goal: LTG: Patient will maintain dynamic sitting balance (OT) Description: LTG:  Patient will maintain dynamic sitting balance with assistance during activities of daily living (OT) Flowsheets (Taken 03/19/2020 1547) LTG: Pt will maintain dynamic sitting balance during ADLs with: (goal downgraded based on slower progress than expected) Minimal Assistance - Patient > 75% Note: goal downgraded based on slower progress than expected Goal: LTG Patient will maintain dynamic standing with ADLs (OT) Description: LTG:  Patient will maintain dynamic standing balance with assist during activities of daily living (OT)  Flowsheets (Taken 03/19/2020 1547) LTG: Pt will maintain dynamic standing balance during ADLs with: (goal downgraded based on slower progress than expected) Moderate Assistance - Patient 50 - 74% Note: goal downgraded based on slower progress than expected   Problem: RH Bathing Goal: LTG Patient will bathe all body parts with assist levels (OT) Description: LTG: Patient will bathe all body parts with assist levels (OT) Flowsheets (Taken 03/19/2020 1547) LTG: Pt will perform bathing with assistance level/cueing: (goal downgraded based on slower progress than expected) Moderate Assistance - Patient 50 - 74% LTG: Position pt will perform bathing: Sit to Stand Note: goal downgraded based on slower progress than expected   Problem: RH Dressing Goal: LTG Patient will perform upper body dressing (OT) Description: LTG Patient  will perform upper body dressing with assist, with/without cues (OT). Flowsheets (Taken 03/19/2020 1547) LTG: Pt will perform upper body dressing with assistance level of: (goal downgraded based on slower progress than expected) Moderate Assistance - Patient 50 - 74% Note: goal downgraded based on slower progress than expected Goal: LTG Patient will perform lower body dressing w/assist (OT) Description: LTG: Patient will perform lower body dressing with assist, with/without cues in positioning using equipment (OT) Flowsheets (Taken 03/19/2020 1547) LTG: Pt will perform lower body dressing with assistance level of: (goal downgraded based on slower progress than expected) Moderate Assistance - Patient 50 - 74% Note: goal downgraded based on slower progress than expected   Problem: RH Toileting Goal: LTG Patient will perform toileting task (3/3 steps) with assistance level (OT) Description: LTG: Patient will perform toileting task (3/3 steps) with assistance level (OT)  Flowsheets (Taken 03/19/2020 1547) LTG: Pt will perform toileting task (3/3 steps) with assistance level: (goal downgraded based on slower progress than expected) Moderate Assistance - Patient 50 - 74% Note: goal downgraded based on slower progress than expected   Problem: RH Functional Use of Upper Extremity Goal: LTG Patient will use RT/LT upper extremity as a (OT) Description: LTG: Patient will use right/left upper extremity as a stabilizer/gross assist/diminished/nondominant/dominant level with assist, with/without cues during functional activity (OT) Flowsheets (Taken 03/19/2020 1547) LTG: Use of upper extremity in functional activities: LUE as gross assist level LTG: Pt will use upper extremity in functional activity with assistance level of: (goal downgraded based on slower progress than expected) Moderate Assistance - Patient 50 - 74% Note: goal downgraded based on slower progress than expected   Problem: RH Toilet  Transfers Goal: LTG Patient will perform toilet transfers w/assist (OT) Description: LTG: Patient will perform toilet transfers with assist, with/without cues using equipment (OT) Flowsheets (  Taken 03/19/2020 1547) LTG: Pt will perform toilet transfers with assistance level of: (goal downgraded based on slower progress than expected) Moderate Assistance - Patient 50 - 74% Note: goal downgraded based on slower progress than expected   Problem: RH Memory Goal: LTG Patient will demonstrate ability for day to day recall/carry over during activities of daily living with assistance level (OT) Description: LTG:  Patient will demonstrate ability for day to day recall/carry over during activities of daily living with assistance level (OT). Flowsheets (Taken 03/19/2020 1547) LTG:  Patient will demonstrate ability for day to day recall/carry over during activities of daily living with assistance level (OT): (goal downgraded based on slower progress than expected) Moderate Assistance - Patient 50 - 74% Note: goal downgraded based on slower progress than expected   Problem: RH Attention Goal: LTG Patient will demonstrate this level of attention during functional activites (OT) Description: LTG:  Patient will demonstrate this level of attention during functional activites  (OT) Flowsheets (Taken 03/19/2020 1547) Patient will demonstrate this level of attention during functional activites: Sustained LTG: Patient will demonstrate this level of attention during functional activites (OT): (goal downgraded based on slower progress than expected) Moderate Assistance - Patient 50 - 74% Note: goal downgraded based on slower progress than expected

## 2020-03-19 NOTE — Progress Notes (Signed)
[place] PHYSICAL MEDICINE & REHABILITATION PROGRESS NOTE   Subjective/Complaints: He does not arouse to verbal stimulation this morning. Discussed with aide at bedside who states he was quite alert this morning but stated he did not want to be awoken now. Discussed with nursing- no concerns, moving bowels regularly.   ROS: unable to assess due to somnolence.   Objective:   DG Swallowing Func-Speech Pathology  Result Date: 03/18/2020 Objective Swallowing Evaluation: Type of Study: MBS-Modified Barium Swallow Study  Patient Details Name: [name] MRN: [id number] Date of Birth: 07-09-1940 Today's Date: 03/18/2020 Past Medical History: Past Medical History: Diagnosis Date . CHF (congestive heart failure) (HCC)  . Chronic kidney disease  . COPD (chronic obstructive pulmonary disease) (HCC)  . Coronary artery disease   a. 2007 dLAD 100->med rx;  b. 2011 PCI: 80 LAD (Promus 2.75x18); c. 03/2015 MV: predom fixed inf/antapical defefcts w/ ischemia, depressed EF;  c. 04/2015 NSTEMI/PCI: LM nl,LAD 40p ISR, 100d (2.5x20 Promus DES), LCX 21m (3.0x20 Promus DES), RCA 30p, 68m, 70d.  . Diabetes mellitus without complication (HCC)  . Dyslipidemia  . Hx MRSA infection  . Hypertension  . Ischemic cardiomyopathy   a. 04/2015 Echo: EF 25-30%, apical AK, Gr 2 DD, triv AI, mild MR, mildly dil LA, PASP . . Tobacco abuse  Past Surgical History: Past Surgical History: Procedure Laterality Date . CARDIAC CATHETERIZATION N/A 05/16/2015  Procedure: Left Heart Cath and Coronary Angiography;  Surgeon: Tonny Bollman, MD;  Location: [facility] INVASIVE CV LAB;  Service: Cardiovascular;  Laterality: N/A; . CARDIAC CATHETERIZATION N/A 05/16/2015  Procedure: Coronary Stent Intervention;  Surgeon: Tonny Bollman, MD;  Location: [facility] INVASIVE CV LAB;  Service: Cardiovascular;  Laterality: N/A; . TEE WITHOUT CARDIOVERSION N/A 03/10/2020  Procedure: TRANSESOPHAGEAL ECHOCARDIOGRAM (TEE);  Surgeon: Lars Masson, MD;  Location: [facility]  ENDOSCOPY;  Service: Cardiovascular;  Laterality: N/A; . TOTAL SHOULDER REPLACEMENT    Right HPI: Pt is a 79 year old male who presented with L-sided weakness, L facial droop, and confusion after being on the ground at home and appears with abrasion on left side of his face. He did not receive tPA and NIH 14-16. 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 It also showed "Associated petechial hemorrhage at the right basal ganglia without frank hemorrhagic transformation or significant regional mass effect", per MRI report. Medical hx of: CAD, DM, tobaccos use and ischemic cardiomyopathy. He was admitted to [facility] 03/05/20.  Subjective: Pt awake, alert, confused Assessment / Plan / Recommendation CHL IP CLINICAL IMPRESSIONS 03/18/2020 Clinical Impression Pt demonstrated mild primarily oral dysphagia today, with significant improvements in oropharyngeal swallow function in comparison to previous MBSS 03/01/20. Although he tends to exhibit piecemeal swallowing of most textures and mild oral holding of thin barium occurred X1 as he began to fatigue, pt is better able to control liquid boluses, swallow initiation occurs in a timely manner, and laryngeal closure provides adequate airway protection. Several single and serial sips of thin barium were trialed via cup and straw, without any penetration or aspiration. He also consumed a barium pill whole with thin without difficulty. Mastication of Dysphagia 3 (mechanical soft) textures was prolonged and decreased bolus cohesion noted, however no airway intrusion noted. Due to fluctuations in cognition and arousal which influence frequency of oral holding, recommend continue Dys 1 (puree) textures for now with advanced solid trials to occur at bedside with ST, upgrade to thin liquids, medications may be administered  whole in purees. Pt should still have full supervision during PO intake to ensure optimal positioning  and use of compensatory swallow strategies. ST will continue to provide skilled interventions to increase pt's independence with use of safe swallow strategies and work toward solid advancement. SLP Visit Diagnosis Dysphagia, oral phase (R13.11) Attention and concentration deficit following -- Frontal lobe and executive function deficit following -- Impact on safety and function Mild aspiration risk   CHL IP TREATMENT RECOMMENDATION 03/01/2020 Treatment Recommendations Therapy as outlined in treatment plan below   Prognosis 03/01/2020 Prognosis for Safe Diet Advancement Good Barriers to Reach Goals -- Barriers/Prognosis Comment -- CHL IP DIET RECOMMENDATION 03/18/2020 SLP Diet Recommendations Dysphagia 1 (Puree) solids;Thin liquid Liquid Administration via Cup;Straw Medication Administration Whole meds with puree Compensations Slow rate;Small sips/bites;Lingual sweep for clearance of pocketing;Other (Comment) Postural Changes Seated upright at 90 degrees   CHL IP OTHER RECOMMENDATIONS 03/18/2020 Recommended Consults -- Oral Care Recommendations Oral care BID Other Recommendations --   CHL IP FOLLOW UP RECOMMENDATIONS 03/02/2020 Follow up Recommendations Inpatient Rehab   CHL IP FREQUENCY AND DURATION 03/01/2020 Speech Therapy Frequency (ACUTE ONLY) min 2x/week Treatment Duration 2 weeks      CHL IP ORAL PHASE 03/18/2020 Oral Phase Impaired Oral - Pudding Teaspoon -- Oral - Pudding Cup -- Oral - Honey Teaspoon -- Oral - Honey Cup NT Oral - Nectar Teaspoon -- Oral - Nectar Cup NT Oral - Nectar Straw NT Oral - Thin Teaspoon -- Oral - Thin Cup Piecemeal swallowing;Holding of bolus;Lingual/palatal residue Oral - Thin Straw Holding of bolus;Piecemeal swallowing Oral - Puree NT Oral - Mech Soft Piecemeal swallowing;Lingual/palatal residue;Delayed oral transit;Decreased bolus cohesion Oral - Regular -- Oral - Multi-Consistency -- Oral - Pill WFL Oral Phase - Comment --  CHL IP PHARYNGEAL PHASE 03/18/2020 Pharyngeal Phase  Impaired Pharyngeal- Pudding Teaspoon -- Pharyngeal -- Pharyngeal- Pudding Cup -- Pharyngeal -- Pharyngeal- Honey Teaspoon -- Pharyngeal -- Pharyngeal- Honey Cup NT Pharyngeal -- Pharyngeal- Nectar Teaspoon -- Pharyngeal -- Pharyngeal- Nectar Cup NT Pharyngeal -- Pharyngeal- Nectar Straw NT Pharyngeal -- Pharyngeal- Thin Teaspoon -- Pharyngeal -- Pharyngeal- Thin Cup [facility] Pharyngeal Material does not enter airway Pharyngeal- Thin Straw WFL Pharyngeal Material does not enter airway Pharyngeal- Puree NT Pharyngeal -- Pharyngeal- Mechanical Soft WFL Pharyngeal -- Pharyngeal- Regular -- Pharyngeal -- Pharyngeal- Multi-consistency -- Pharyngeal -- Pharyngeal- Pill WFL Pharyngeal -- Pharyngeal Comment --  CHL IP CERVICAL ESOPHAGEAL PHASE 03/18/2020 Cervical Esophageal Phase WFL Pudding Teaspoon -- Pudding Cup -- Honey Teaspoon -- Honey Cup -- Nectar Teaspoon -- Nectar Cup -- Nectar Straw -- Thin Teaspoon -- Thin Cup -- Thin Straw -- Puree -- Mechanical Soft -- Regular -- Multi-consistency -- Pill -- Cervical Esophageal Comment -- [name] 03/18/2020, 12:05 PM              Recent Labs    03/17/20 1820  WBC 7.5  HGB 9.7*  HCT 29.9*  PLT 301   Recent Labs    03/17/20 1708  NA 138  K 4.4  CL 107  CO2 23  GLUCOSE 169*  BUN 36*  CREATININE 1.21  CALCIUM 8.7*    Intake/Output Summary (Last 24 hours) at 03/19/2020 0924 Last data filed at 03/19/2020 0530 Gross per 24 hour  Intake 1366.31 ml  Output 200 ml  Net 1166.31 ml        Physical Exam: Vital Signs Blood pressure (!) 126/98, pulse 82, temperature 98.2 F (36.8 C), resp. rate 18, height 5\' 11"  (1.803 m), weight 66.7  kg, SpO2 100 %. Gen: no distress, normal appearing, very somnolent HEENT: oral mucosa pink and moist, NCAT Cardio: Reg rate Chest: normal effort, normal rate of breathing Abd: soft, non-distended Ext: no edema Skin: intact Neurologic/MSK: Too somnolent to tolerate exam.   Assessment/Plan: 1. Functional deficits  which require 3+ hours per day of interdisciplinary therapy in a comprehensive inpatient rehab setting.  Physiatrist is providing close team supervision and 24 hour management of active medical problems listed below.  Physiatrist and rehab team continue to assess barriers to discharge/monitor patient progress toward functional and medical goals  Care Tool:  Bathing    Body parts bathed by patient: Chest, Abdomen, Left arm, Front perineal area, Buttocks, Face   Body parts bathed by helper: Right arm Body parts n/a: Right upper leg, Left upper leg, Right lower leg, Left lower leg (did not attempt secondary to time)   Bathing assist Assist Level: Minimal Assistance - Patient > 75%     Upper Body Dressing/Undressing Upper body dressing   What is the patient wearing?: Pull over shirt    Upper body assist Assist Level: Maximal Assistance - Patient 25 - 49%    Lower Body Dressing/Undressing Lower body dressing      What is the patient wearing?: Pants     Lower body assist Assist for lower body dressing: Maximal Assistance - Patient 25 - 49%     Toileting Toileting    Toileting assist Assist for toileting: Maximal Assistance - Patient 25 - 49%     Transfers Chair/bed transfer  Transfers assist     Chair/bed transfer assist level: Moderate Assistance - Patient 50 - 74%     Locomotion Ambulation   Ambulation assist      Assist level: Moderate Assistance - Patient 50 - 74% Assistive device: Walker-rolling Max distance: 155ft   Walk 10 feet activity   Assist     Assist level: Moderate Assistance - Patient - 50 - 74% Assistive device: Walker-rolling   Walk 50 feet activity   Assist Walk 50 feet with 2 turns activity did not occur: Safety/medical concerns (due to patient fatigue/weakness)  Assist level: Moderate Assistance - Patient - 50 - 74% Assistive device: Walker-rolling    Walk 150 feet activity   Assist Walk 150 feet activity did not occur:  Safety/medical concerns (due to patient fatigue/weakness)         Walk 10 feet on uneven surface  activity   Assist Walk 10 feet on uneven surfaces activity did not occur: Safety/medical concerns (due to patient fatigue/weakness)         Wheelchair     Assist Will patient use wheelchair at discharge?: Yes Type of Wheelchair: Manual    Wheelchair assist level: Dependent - Patient 0% (TIS w/c) Max wheelchair distance: 150    Wheelchair 50 feet with 2 turns activity    Assist        Assist Level: Dependent - Patient 0%   Wheelchair 150 feet activity     Assist      Assist Level: Dependent - Patient 0%   Blood pressure (!) 126/98, pulse 82, temperature 98.2 F (36.8 C), resp. rate 18, height  (1.803 m), weight 66.7 kg, SpO2 100 %.    Medical Problem List and Plan: 1. 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               -  patient may shower             -ELOS/Goals: 12/2 may need SNF post hospital d/c to complete abx             Continue CIR, PT,  OT, SLP  -Interdisciplinary Team Conference today  2.  Antithrombotics: -DVT/anticoagulation:  Pharmaceutical: Other (comment)--Eliquis.              -antiplatelet therapy: N/A--has been off ASA/Brilinta.  3. Chronic HA/Cervicalgia/Pain Management: Was on flexeril prn PTA. Will monitor N/A. Will order kpad and cervical spine XR given new neck pain and TTP on cervical spine. 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             Monitor with increased exertion  11/22- denies pain specifically when asked this AM- con't regimen 4. Mood: LCSW to follow for evaluation and support.              -antipsychotic agents: Monitor for now.  5. Neuropsych:  This patient is not capable of making decisions on his own behalf. 6. Skin/Wound Care: Routine pressure relief measures. Protein supplement due to evidence of malnutrition.  7. Fluids/Electrolytes/Nutrition: Monitor I/Os.              CMP 11/11 is stable.  8. Gastroenteritis: Had multiple episodes of N/V on 11/09 with 4 incontinent stools and low grade fever. Monitor for GI virus. 9. HTN: Monitor BP tid--educate on importance of compliance. SBP goal 130-150   Vitals:   03/18/20 2057 03/19/20 0539  BP: (!) 121/91 (!) 126/98  Pulse: 76 82  Resp: 15 18  Temp: 98.1 F (36.7 C) 98.2 F (36.8 C)  SpO2: 98% 100%  11/24: diastolic BP is elevated- has been labile, continue to monitor.   10. ICM/Heart failure with persistent apical thrombus: Monitor for signs and symptoms of fluid overload. Daily weights. Continue Eliquis.  11. A fib: Monitor HR tid--continue Lanoxin and Eliquis.              Monitor with increased activity 12. Acute on chronic renal failure: Likely due to gastroenteritis. SCr 1.55-->1.3-->1.53.   Will recheck lytes in am.              Cr trending down on 11/11  11/15- Cr down to 1.1- con't to monitor  11/22- Cr doing overall well, but BUN up to 36 and Cr 1.21- slightly worse than last time- s/p IVFx 1L 13. Electrolytes abnormalities: Resolved post supplementation--recheck magnesium/potassium levels in am.  14. Anemia: Recheck iron levels.                    11/24: Hgb 9.7, down from 10.4, continue to monitor weekly.  15. T2DM: Hgb A1C-6.8.  probable neuropathy trial gabapentin low dose   Will monitor BS ac/hs.Add CM restrictions to diet.              Monitor with increased mobility CBG (last 3)  Recent Labs    03/18/20 1622 03/18/20 2054 03/19/20 0623  GLUCAP 131* 182* 107*   11/24: CBGs elevated to 254 last night. Cr is now stable so will restart metformin 500mg  daily.   16. Aspiration Pneumonitis: no signs of PNA, already on IV Cefazolin, d/c augmentin, neg CXR, nl  WBCs 17. Dysphagia: Continue CIR SLP 18. MSSA bacteremia  TEE showed no valve vegetations, no other sig structural abnl in report cardiology to f/u with recs Per ID will need IV cefazolin q 8h, to D/C on 04/05/20.  Will need midline placed if pt leaves hospital setting prior to this date- daughter would like pt to go to [place], may need SNF placement to complete abx in [place] area, so pt does not miss abx dose in transit 19.  COgnitive deficits from CVA +/- encephalopathy from bacteremia, may need to add methylphenidate will discuss with team   11/22- very sedated- hard to wake up.    LOS: 14 days A FACE TO FACE EVALUATION WAS PERFORMED  Drema Pry Starlyn Droge 03/19/2020, 9:24 AM

## 2020-03-19 NOTE — Progress Notes (Signed)
Occupational Therapy Session Note  Patient Details  Name: [name] MRN: [id number] Date of Birth: 1941/02/09  Today's Date: 03/19/2020 OT Individual Time: 9381-8299 OT Individual Time Calculation (min): 70 min    Short Term Goals: Week 1:  OT Short Term Goal 1 (Week 1): Pt will complete UB dressing with min assist for pullover shirt. OT Short Term Goal 1 - Progress (Week 1): Not met OT Short Term Goal 2 (Week 1): Pt will complete LB bathing with mod assist sit to stand for two consecutive sessions. OT Short Term Goal 2 - Progress (Week 1): Met OT Short Term Goal 3 (Week 1): Pt will complete LB dressing with mod assist sit to stand. OT Short Term Goal 3 - Progress (Week 1): Not met OT Short Term Goal 4 (Week 1): Pt will use the LUE with min assist for washing the RUE. OT Short Term Goal 4 - Progress (Week 1): Not met Week 2:  OT Short Term Goal 1 (Week 2): Pt will complete LB dressing with mod assist sit to stand. OT Short Term Goal 2 (Week 2): Pt will use the LUE with mod assist for washing the RUE. OT Short Term Goal 3 (Week 2): Pt will complete UB dressing with min assist for pullover shirt for two sessions. OT Short Term Goal 4 (Week 2): Pt will complete toilet transfers with min assist using the RW for support. OT Short Term Goal 5 (Week 2): Pt will completed bathing tasks with no more than mod instructional cueing for initiation and completion of bathing tasks.      Skilled Therapeutic Interventions/Progress Updates:    Pt seen this session to focus on functional transfers and LUE NMR.  Pt asleep in bed with a soaking wet brief and bed was soaked.   Pt woke up and completed transfers to wc and then to [facility] over toilet with mod A. Pt perseverated on pulling TP out and eventually needed total A with clean up.  Transferred back to w/c to bathe and dress mod -max A.  Pt stood at sink several times with only CGA to rise to stand.  Held balance with min a. Pt very distracted with poor  attention span and perseverative behaviors. Pt talking constantly and complaining about numerous things demonstrating confusion.   Pt worked on [employer] with a/arom with max cuing focusing on sh, elbow and grasp.  transferred back to bed mod A.  In bed with all needs met and alarm set.   Therapy Documentation Precautions:  Precautions Precautions: Fall Precaution Comments: L hemi, L inattention Restrictions Weight Bearing Restrictions: No Vital Signs: Therapy Vitals Temp: 98.2 F (36.8 C) Pulse Rate: 82 Resp: 18 BP: (!) 126/98 Patient Position (if appropriate): Lying Oxygen Therapy SpO2: 100 % O2 Device: Room Air Pain: C/o neck pain with turning his head, no pain at rest, used massage to relax neck  ADL: ADL Eating: Supervision/safety Where Assessed-Eating: Bed level Grooming: Minimal assistance Where Assessed-Grooming: Bed level Upper Body Bathing: Minimal assistance Where Assessed-Upper Body Bathing: Wheelchair Lower Body Bathing: Dependent Where Assessed-Lower Body Bathing: Wheelchair, Sitting at sink, Standing at sink Upper Body Dressing: Maximal assistance Where Assessed-Upper Body Dressing: Sitting at sink Lower Body Dressing: Dependent Where Assessed-Lower Body Dressing: Sitting at sink, Standing at sink Toileting: Dependent Where Assessed-Toileting: Bedside Commode Toilet Transfer: Maximal assistance Toilet Transfer Method: Stand pivot Toilet Transfer Equipment: Bedside commode Tub/Shower Transfer: Not assessed  Therapy/Group: Individual Therapy  [place] 03/19/2020, 8:47 AM

## 2020-03-19 NOTE — Progress Notes (Signed)
Patient ID: [name], male   DOB: [birth year], 79 y.o.   MRN: [id number]   SW received call from [facility] requesting confirmation that patient would discharge to SNF on 11/29. Patient information being sent to review by MD. Will wait for update.  [place], [place] [phone]

## 2020-03-19 NOTE — Progress Notes (Signed)
Physical Therapy Weekly Progress Note  Patient Details  Name: [name] MRN: [id number] Date of Birth: [birth year]  Beginning of progress report period: March 13, 2020 End of progress report period: March 19, 2020  Patient has met 0 of 4 short term goals.  Patient progressing slowly toward his goals. He remains limited by decreased participation in meaningful therapy, L inattention/neglect, decreased functional strength and impaired sitting/standing balance. Patient has progressed to gait training with RW and ModA x1, but due to L inattention and poor endurance, he does require ModA. He is able to transfer bed<> wc with grossly ModA- increase in assistance needed due to poor motor planning and sequencing.   Patient continues to demonstrate the following deficits muscle weakness, decreased cardiorespiratoy endurance, impaired timing and sequencing, abnormal tone, unbalanced muscle activation, motor apraxia, ataxia, decreased coordination and decreased motor planning, decreased visual perceptual skills, decreased midline orientation, decreased attention to left, left side neglect and decreased motor planning, decreased initiation, decreased attention, decreased awareness, decreased problem solving, decreased safety awareness, decreased memory and delayed processing and decreased sitting balance, decreased standing balance, decreased postural control, hemiplegia and decreased balance strategies and therefore will continue to benefit from skilled PT intervention to increase functional independence with mobility.  Patient progressing toward long term goals..  Continue plan of care.  PT Short Term Goals Week 2:  PT Short Term Goal 1 (Week 2): Pt will perform supine<>sit with min assist PT Short Term Goal 1 - Progress (Week 2): Progressing toward goal PT Short Term Goal 2 (Week 2): Pt will perform sit<>stands using LRAD with min assist PT Short Term Goal 2 - Progress (Week 2): Progressing toward  goal PT Short Term Goal 3 (Week 2): Pt will perform bed<>chair transfers using LRAD with min assist PT Short Term Goal 3 - Progress (Week 2): Progressing toward goal PT Short Term Goal 4 (Week 2): Pt will ambulate at least 65f using LRAD with min assist of 1 PT Short Term Goal 4 - Progress (Week 2): Progressing toward goal Week 3:  PT Short Term Goal 1 (Week 3): STG= LTG based on ELOS   Therapy Documentation Precautions:  Precautions Precautions: Fall Precaution Comments: L hemi, L inattention Restrictions Weight Bearing Restrictions: No    JDebbora Dus11/24/2021, 7:56 AM

## 2020-03-19 NOTE — Progress Notes (Addendum)
Occupational Therapy Session Note  Patient Details  Name: [name] MRN: [id number] Date of Birth: 01/27/1941  Today's Date: 03/19/2020 OT Individual Time: 5176-1607 OT Individual Time Calculation (min): 70 min    Short Term Goals: Week 2:  OT Short Term Goal 1 (Week 2): Pt will complete LB dressing with mod assist sit to stand. OT Short Term Goal 2 (Week 2): Pt will use the LUE with mod assist for washing the RUE. OT Short Term Goal 3 (Week 2): Pt will complete UB dressing with min assist for pullover shirt for two sessions. OT Short Term Goal 4 (Week 2): Pt will complete toilet transfers with min assist using the RW for support. OT Short Term Goal 5 (Week 2): Pt will completed bathing tasks with no more than mod instructional cueing for initiation and completion of bathing tasks.  Skilled Therapeutic Interventions/Progress Updates:  Pt received semi-reclined in bed with bed alarm engaged. Session focused on self-feeding as pt stated he would like to eat lunch. Req mod A to initiate supine to sit EOB and to move legs off EOB. Mod A for stand pivot to TIS w/c for initial lift and to shift weight from RLE to LLE. Seated in TIS w/c, pt self-fed himself 100% of his lunch with RUE with supervision/CGA + max VCs and tactile cues to take smaller bites, avoid L sided pocketing, and to swallow each time per to SLP dysphasia plan. Additionally required visual cue of presenting drink and napkin to his R side to initiate taking a sip of his beverage and when to wipe his mouth. D/t L inattention, pt initially did not initiate wiping food on the left side of his mouth, but progressed to wiping full mouth without additional cuing at end of session. Left pt in TIS w/c with seatbelt alarm engaged, call bell in reach, and all immediate needs met.   Therapy Documentation Precautions:  Precautions Precautions: Fall Precaution Comments: L hemi, L inattention Restrictions Weight Bearing Restrictions:  No  Pain: Pain Assessment Pain Scale: 0-10 Pain Score: 0-No pain   ADL: See Care Tool for more details.  Therapy/Group: Individual Therapy  Pavlos Yon 03/19/2020, 3:07 PM

## 2020-03-19 NOTE — Progress Notes (Signed)
Patient ID: [name], male   DOB: February 07, 1941, 79 y.o.   MRN: [id number] Team Conference Report to Patient/Family  Team Conference discussion was reviewed with the patient and caregiver, including goals, any changes in plan of care and target discharge date.  Patient and caregiver express understanding and are in agreement.  The patient has a target discharge date of 03/24/20 (SNF).  Andria Rhein 03/19/2020, 1:21 PM

## 2020-03-19 NOTE — Patient Care Conference (Signed)
Inpatient RehabilitationTeam Conference and Plan of Care Update Date: 03/19/2020   Time: 10:52 AM    Patient Name: [name]      Medical Record Number: [id number]  Date of Birth: 04-26-1941 Sex: Male         Room/Bed: 4W08C/4W08C-01 Payor Info: Payor: HU[employer] MEDICARE / Plan: [employer] MEDICARE CHOICE PPO / Product Type: *No Product type* /    Admit Date/Time:  03/05/2020  3:24 PM  Primary Diagnosis:  MSSA bacteremia  Hospital Problems: Principal Problem:   MSSA bacteremia Active Problems:   Type II diabetes mellitus (HCC)   Acute right MCA stroke (HCC)   Protein-calorie malnutrition, severe    Expected Discharge Date: Expected Discharge Date: 03/24/20 (SNF)  Team Members Present: Physician leading conference: Dr. Sula Soda Care Coodinator Present: Chana Bode, RN, BSN, CRRN;Christina Florence, BSW Nurse Present: Other (comment) Montel Clock, RN) PT Present: Merry Lofty, PT OT Present: Perrin Maltese, OT SLP Present: Elio Forget, SLP PPS Coordinator present : Fae Pippin, [place], PT     Current Status/Progress Goal Weekly Team Focus  Bowel/Bladder   pt inc of b and b lbm- 03/19/20  decreased amounts of b and b  assess q shift and times toilette   Swallow/Nutrition/ Hydration   Dys 1 textures, oral holding improving some, thin liquids (upgraded after MBS 11/23)  Min A  carryover compensatory swallow strategies to reduce holding and clear pocketing, Dys 2 trials, diet tolerance   ADL's             Mobility   mod assist bed mobility, mod assist sit<>stand and stand pivot transfers, mod assist gait up to 140ft using RW; continues to demo L inattention, impaired safety awareness with impaired awareness of deficits, and impaired midline orientation with L lateral lean  downgraded to min/mod assist  L attention, dynamic sitting balance, dynamic standing balance, transfer training, gait training, pt education, activity tolerance, participation in therapy    Communication   Dysarthria and abnormal respiratatory pattern with speech at times, Mod A intelligibility strategies  Mod A (downgraded)      Safety/Cognition/ Behavioral Observations  Mod-Max  Min-Mod (downgraded)  attention, safety and intellectual awareness, recall, basic familiar problem solving   Pain   pt c/o of headach unable to name leve  decrease pain  Assess q shoft and prn   Skin   pt has two abrasions on bilateral knees, abrasion on left shoulder  healthy healing and prevention of infection  continue dressing changed q 3 days or prn     Discharge Planning:  Discharging to SNF   Team Discussion: Labile blood pressure and elevated CBG levels noted; MD to adjust medications. Progress limited by pushing, impaired awareness of deficits, left lateral lean, decreased orientation and preservation. Requires hand over hand assist for activities and mod-max assist for bathing and dressing. Patient on target to meet rehab goals: no, goals downgraded to mod assist  *See Care Plan and progress notes for long and short-term goals.   Revisions to Treatment Plan:   Teaching Needs: Toileting, tranfers, medications, etc.  Current Barriers to Discharge: Decreased caregiver support  Possible Resolutions to Barriers: Recommend SNF placement at discharge     Medical Summary Current Status: CBG elevated to 254 last night, very somnolent, havinbg regular BM, eating most of his meals, hemoglobin down to 9.7, disatolic BP elevated  Barriers to Discharge: Behavior;Medical stability  Barriers to Discharge Comments: CBG elevated to 254 last night, hemoglobin down to 9.7, somnolent at times, diastolic  BP elevated Possible Resolutions to [employer] Focus: Restart Metformin 500mg , monitor hemoglobin weekly, monitor BP TID   Continued Need for Acute Rehabilitation Level of Care: The patient requires daily medical management by a physician with specialized training in physical medicine and  rehabilitation for the following reasons: Direction of a multidisciplinary physical rehabilitation program to maximize functional independence : Yes Medical management of patient stability for increased activity during participation in an intensive rehabilitation regime.: Yes Analysis of laboratory values and/or radiology reports with any subsequent need for medication adjustment and/or medical intervention. : Yes   I attest that I was present, lead the team conference, and concur with the assessment and plan of the team.   B 03/19/2020, 1:49 PM

## 2020-03-20 LAB — GLUCOSE, CAPILLARY
Glucose-Capillary: 78 mg/dL (ref 70–99)
Glucose-Capillary: 185 mg/dL — ABNORMAL HIGH (ref 70–99)
Glucose-Capillary: 133 mg/dL — ABNORMAL HIGH (ref 70–99)
Glucose-Capillary: 166 mg/dL — ABNORMAL HIGH (ref 70–99)

## 2020-03-20 LAB — DIGOXIN LEVEL: Digoxin Level: 0.7 ng/mL — ABNORMAL LOW (ref 1.0–2.0)

## 2020-03-20 NOTE — Progress Notes (Signed)
[place] PHYSICAL MEDICINE & REHABILITATION PROGRESS NOTE   Subjective/Complaints:  Pt awake, eating/feeding self his breakfast-  Started talking about sending kids to [place]- wasn't real clear.  Did say his bowels are working.  Dnies any issues.  Has D1 thin diet.   NT in room supervising him eating- did cough once while in room while eating too fast.    ROS: unable to assess due to cognition  Objective:   DG Swallowing Func-Speech Pathology  Result Date: 03/18/2020 Objective Swallowing Evaluation: Type of Study: MBS-Modified Barium Swallow Study  Patient Details Name: [name] MRN: [id number] Date of Birth: [birth year] Today's Date: 03/18/2020 Past Medical History: Past Medical History: Diagnosis Date . CHF (congestive heart failure) (HCC)  . Chronic kidney disease  . COPD (chronic obstructive pulmonary disease) (HCC)  . Coronary artery disease   a. 2007 dLAD 100->med rx;  b. 2011 PCI: 80 LAD (Promus 2.75x18); c. 03/2015 MV: predom fixed inf/antapical defefcts w/ ischemia, depressed EF;  c. 04/2015 NSTEMI/PCI: LM nl,LAD 40p ISR, 100d (2.5x20 Promus DES), LCX 36m (3.0x20 Promus DES), RCA 30p, 86m, 70d.  . Diabetes mellitus without complication (HCC)  . Dyslipidemia  . Hx MRSA infection  . Hypertension  . Ischemic cardiomyopathy   a. 04/2015 Echo: EF 25-30%, apical AK, Gr 2 DD, triv AI, mild MR, mildly dil LA, PASP . . Tobacco abuse  Past Surgical History: Past Surgical History: Procedure Laterality Date . CARDIAC CATHETERIZATION N/A 05/16/2015  Procedure: Left Heart Cath and Coronary Angiography;  Surgeon: Tonny Bollman, MD;  Location: [facility] INVASIVE CV LAB;  Service: Cardiovascular;  Laterality: N/A; . CARDIAC CATHETERIZATION N/A 05/16/2015  Procedure: Coronary Stent Intervention;  Surgeon: Tonny Bollman, MD;  Location: [facility] INVASIVE CV LAB;  Service: Cardiovascular;  Laterality: N/A; . TEE WITHOUT CARDIOVERSION N/A 03/10/2020  Procedure: TRANSESOPHAGEAL ECHOCARDIOGRAM (TEE);  Surgeon:  Lars Masson, MD;  Location: [facility] ENDOSCOPY;  Service: Cardiovascular;  Laterality: N/A; . TOTAL SHOULDER REPLACEMENT    Right HPI: Pt is a 79 year old male who presented with L-sided weakness, L facial droop, and confusion after being on the ground at home and appears with abrasion on left side of his face. He did not receive tPA and NIH 14-16. 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 It also showed "Associated petechial hemorrhage at the right basal ganglia without frank hemorrhagic transformation or significant regional mass effect", per MRI report. Medical hx of: CAD, DM, tobaccos use and ischemic cardiomyopathy. He was admitted to [facility] 03/05/20.  Subjective: Pt awake, alert, confused Assessment / Plan / Recommendation CHL IP CLINICAL IMPRESSIONS 03/18/2020 Clinical Impression Pt demonstrated mild primarily oral dysphagia today, with significant improvements in oropharyngeal swallow function in comparison to previous MBSS 03/01/20. Although he tends to exhibit piecemeal swallowing of most textures and mild oral holding of thin barium occurred X1 as he began to fatigue, pt is better able to control liquid boluses, swallow initiation occurs in a timely manner, and laryngeal closure provides adequate airway protection. Several single and serial sips of thin barium were trialed via cup and straw, without any penetration or aspiration. He also consumed a barium pill whole with thin without difficulty. Mastication of Dysphagia 3 (mechanical soft) textures was prolonged and decreased bolus cohesion noted, however no airway intrusion noted. Due to fluctuations in cognition and arousal which influence frequency of oral holding, recommend continue Dys 1 (puree) textures for now with advanced solid trials to occur  at bedside with ST, upgrade to thin liquids, medications may be administered whole in purees. Pt should still have full supervision during PO  intake to ensure optimal positioning and use of compensatory swallow strategies. ST will continue to provide skilled interventions to increase pt's independence with use of safe swallow strategies and work toward solid advancement. SLP Visit Diagnosis Dysphagia, oral phase (R13.11) Attention and concentration deficit following -- Frontal lobe and executive function deficit following -- Impact on safety and function Mild aspiration risk   CHL IP TREATMENT RECOMMENDATION 03/01/2020 Treatment Recommendations Therapy as outlined in treatment plan below   Prognosis 03/01/2020 Prognosis for Safe Diet Advancement Good Barriers to Reach Goals -- Barriers/Prognosis Comment -- CHL IP DIET RECOMMENDATION 03/18/2020 SLP Diet Recommendations Dysphagia 1 (Puree) solids;Thin liquid Liquid Administration via Cup;Straw Medication Administration Whole meds with puree Compensations Slow rate;Small sips/bites;Lingual sweep for clearance of pocketing;Other (Comment) Postural Changes Seated upright at 90 degrees   CHL IP OTHER RECOMMENDATIONS 03/18/2020 Recommended Consults -- Oral Care Recommendations Oral care BID Other Recommendations --   CHL IP FOLLOW UP RECOMMENDATIONS 03/02/2020 Follow up Recommendations Inpatient Rehab   CHL IP FREQUENCY AND DURATION 03/01/2020 Speech Therapy Frequency (ACUTE ONLY) min 2x/week Treatment Duration 2 weeks      CHL IP ORAL PHASE 03/18/2020 Oral Phase Impaired Oral - Pudding Teaspoon -- Oral - Pudding Cup -- Oral - Honey Teaspoon -- Oral - Honey Cup NT Oral - Nectar Teaspoon -- Oral - Nectar Cup NT Oral - Nectar Straw NT Oral - Thin Teaspoon -- Oral - Thin Cup Piecemeal swallowing;Holding of bolus;Lingual/palatal residue Oral - Thin Straw Holding of bolus;Piecemeal swallowing Oral - Puree NT Oral - Mech Soft Piecemeal swallowing;Lingual/palatal residue;Delayed oral transit;Decreased bolus cohesion Oral - Regular -- Oral - Multi-Consistency -- Oral - Pill WFL Oral Phase - Comment --  CHL IP PHARYNGEAL  PHASE 03/18/2020 Pharyngeal Phase Impaired Pharyngeal- Pudding Teaspoon -- Pharyngeal -- Pharyngeal- Pudding Cup -- Pharyngeal -- Pharyngeal- Honey Teaspoon -- Pharyngeal -- Pharyngeal- Honey Cup NT Pharyngeal -- Pharyngeal- Nectar Teaspoon -- Pharyngeal -- Pharyngeal- Nectar Cup NT Pharyngeal -- Pharyngeal- Nectar Straw NT Pharyngeal -- Pharyngeal- Thin Teaspoon -- Pharyngeal -- Pharyngeal- Thin Cup [facility] Pharyngeal Material does not enter airway Pharyngeal- Thin Straw WFL Pharyngeal Material does not enter airway Pharyngeal- Puree NT Pharyngeal -- Pharyngeal- Mechanical Soft WFL Pharyngeal -- Pharyngeal- Regular -- Pharyngeal -- Pharyngeal- Multi-consistency -- Pharyngeal -- Pharyngeal- Pill WFL Pharyngeal -- Pharyngeal Comment --  CHL IP CERVICAL ESOPHAGEAL PHASE 03/18/2020 Cervical Esophageal Phase WFL Pudding Teaspoon -- Pudding Cup -- Honey Teaspoon -- Honey Cup -- Nectar Teaspoon -- Nectar Cup -- Nectar Straw -- Thin Teaspoon -- Thin Cup -- Thin Straw -- Puree -- Mechanical Soft -- Regular -- Multi-consistency -- Pill -- Cervical Esophageal Comment -- [name] 03/18/2020, 12:05 PM              Recent Labs    03/17/20 1820  WBC 7.5  HGB 9.7*  HCT 29.9*  PLT 301   Recent Labs    03/17/20 1708 03/19/20 1513  NA 138 141  K 4.4 4.4  CL 107 107  CO2 23 25  GLUCOSE 169* 152*  BUN 36* 25*  CREATININE 1.21 1.22  CALCIUM 8.7* 8.7*    Intake/Output Summary (Last 24 hours) at 03/20/2020 1015 Last data filed at 03/20/2020 1638 Gross per 24 hour  Intake 540 ml  Output --  Net 540 ml        Physical Exam: Vital Signs  Blood pressure 138/63, pulse (!) 50, temperature 97.7 F (36.5 C), temperature source Oral, resp. rate 18, height 5\' 11"  (1.803 m), weight 69.2 kg, SpO2 100 %. Gen: sitting up in bed- feeding self breakfast, coughed x1- was eating fast- slowed down, NAD HEENT: conjugate gaze Cardio: bradycardic- irregular rhythm Chest:CTA B/L- no W/R/R- good air movement Abd: Soft,  NT, ND, (+)BS  Ext: no edema Skin: intact Neurologic/MSK: awake, but sounded confused- talking about sending kids to [place].   Assessment/Plan: 1. Functional deficits which require 3+ hours per day of interdisciplinary therapy in a comprehensive inpatient rehab setting.  Physiatrist is providing close team supervision and 24 hour management of active medical problems listed below.  Physiatrist and rehab team continue to assess barriers to discharge/monitor patient progress toward functional and medical goals  Care Tool:  Bathing    Body parts bathed by patient: Chest, Abdomen, Left arm, Front perineal area, Face, Right upper leg, Left upper leg   Body parts bathed by helper: Buttocks, Right lower leg, Left lower leg, Right arm Body parts n/a: Right upper leg, Left upper leg, Right lower leg, Left lower leg (did not attempt secondary to time)   Bathing assist Assist Level: Moderate Assistance - Patient 50 - 74%     Upper Body Dressing/Undressing Upper body dressing   What is the patient wearing?: Pull over shirt    Upper body assist Assist Level: Moderate Assistance - Patient 50 - 74%    Lower Body Dressing/Undressing Lower body dressing      What is the patient wearing?: Pants, Incontinence brief     Lower body assist Assist for lower body dressing: Maximal Assistance - Patient 25 - 49%     Toileting Toileting    Toileting assist Assist for toileting: Maximal Assistance - Patient 25 - 49%     Transfers Chair/bed transfer  Transfers assist     Chair/bed transfer assist level: Moderate Assistance - Patient 50 - 74%     Locomotion Ambulation   Ambulation assist      Assist level: Moderate Assistance - Patient 50 - 74% Assistive device: Walker-rolling Max distance: 159ft   Walk 10 feet activity   Assist     Assist level: Moderate Assistance - Patient - 50 - 74% Assistive device: Walker-rolling   Walk 50 feet activity   Assist Walk 50 feet with  2 turns activity did not occur: Safety/medical concerns (due to patient fatigue/weakness)  Assist level: Moderate Assistance - Patient - 50 - 74% Assistive device: Walker-rolling    Walk 150 feet activity   Assist Walk 150 feet activity did not occur: Safety/medical concerns (due to patient fatigue/weakness)         Walk 10 feet on uneven surface  activity   Assist Walk 10 feet on uneven surfaces activity did not occur: Safety/medical concerns (due to patient fatigue/weakness)         Wheelchair     Assist Will patient use wheelchair at discharge?: Yes Type of Wheelchair: Manual    Wheelchair assist level: Dependent - Patient 0% (TIS w/c) Max wheelchair distance: 150    Wheelchair 50 feet with 2 turns activity    Assist        Assist Level: Dependent - Patient 0%   Wheelchair 150 feet activity     Assist      Assist Level: Dependent - Patient 0%   Blood pressure 138/63, pulse (!) 50, temperature 97.7 F (36.5 C), temperature source Oral, resp. rate 18, height   (1.803 m), weight 69.2 kg, SpO2 100 %.    Medical Problem List and Plan: 1. 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               -patient may shower             -ELOS/Goals: 12/2 may need SNF post hospital d/c to complete abx             Continue CIR, PT,  OT, SLP  -Interdisciplinary Team Conference today  2.  Antithrombotics: -DVT/anticoagulation:  Pharmaceutical: Other (comment)--Eliquis.              -antiplatelet therapy: N/A--has been off ASA/Brilinta.  3. Chronic HA/Cervicalgia/Pain Management: Was on flexeril prn PTA. Will monitor N/A. Will order kpad and cervical spine XR given new neck pain and TTP on cervical spine. ADDENDUM: XR  shows diffuse cervical spine degenerative change.             Monitor with increased exertion  11/22- denies pain specifically when asked this AM- con't regimen  11/25- denies pain-c on't regimen 4. Mood: LCSW to follow for evaluation and support.              -antipsychotic agents: Monitor for now.  5. Neuropsych: This patient is not capable of making decisions on his own behalf. 6. Skin/Wound Care: Routine pressure relief measures. Protein supplement due to evidence of malnutrition.  7. Fluids/Electrolytes/Nutrition: Monitor I/Os.              CMP 11/11 is stable.  11/25- Last Cr 1.22- and BUN down to 25 from 26- doing much better  8. Gastroenteritis: Had multiple episodes of N/V on 11/09 with 4 incontinent stools and low grade fever. Monitor for GI virus. 9. HTN: Monitor BP tid--educate on importance of compliance. SBP goal 130-150   Vitals:   03/19/20 1948 03/20/20 0417  BP: (!) 134/57 138/63  Pulse: 71 (!) 50  Resp: 20 18  Temp: 98 F (36.7 C) 97.7 F (36.5 C)  SpO2: 99% 100%  11/24: diastolic BP is elevated- has been labile, continue to monitor.    11/25- BP controlled- DBP low today actually 10. ICM/Heart failure with persistent apical thrombus: Monitor for signs and symptoms of fluid overload. Daily weights. Continue Eliquis.  11. A fib: Monitor HR tid--continue Lanoxin and Eliquis.              Monitor with increased activity 12. Acute on chronic renal failure: Likely due to gastroenteritis. SCr 1.55-->1.3-->1.53.   Will recheck lytes in am.              Cr trending down on 11/11  11/15- Cr down to 1.1- con't to monitor  11/22- Cr doing overall well, but BUN up to 36 and Cr 1.21- slightly worse than last time- s/p IVFx 1L  11/25- Cr 1.22- BUN 25- down from 36- doing better 13. Electrolytes abnormalities: Resolved post supplementation--recheck magnesium/potassium levels in am.  14. Anemia: Recheck iron levels.                    11/24: Hgb 9.7, down from 10.4, continue to  monitor weekly.  15. T2DM: Hgb A1C-6.8.  probable neuropathy trial gabapentin low dose   Will monitor BS ac/hs.Add CM restrictions to diet.  Monitor with increased mobility CBG (last 3)  Recent Labs    03/19/20 1628 03/19/20 2111 03/20/20 0602  GLUCAP 127* 224* 78   11/24: CBGs elevated to 254 last night. Cr is now stable so will restart metformin 500mg  daily.  11/25- BGs better- con't regimen   16. Aspiration Pneumonitis: no signs of PNA, already on IV Cefazolin, d/c augmentin, neg CXR, nl WBCs 17. Dysphagia: Continue CIR SLP 18. MSSA bacteremia  TEE showed no valve vegetations, no other sig structural abnl in report cardiology to f/u with recs Per ID will need IV cefazolin q 8h, to D/C on 04/05/20.  Will need midline placed if pt leaves hospital setting prior to this date- daughter would like pt to go to [place], may need SNF placement to complete abx in [place] area, so pt does not miss abx dose in transit 19.  COgnitive deficits from CVA +/- encephalopathy from bacteremia, may need to add methylphenidate will discuss with team   11/22- very sedated- hard to wake up.   11/25- up eating breakfast this AM   LOS: 15 days A FACE TO FACE EVALUATION WAS PERFORMED  Shyquan Stallbaumer 03/20/2020, 10:15 AM

## 2020-03-21 ENCOUNTER — Inpatient Hospital Stay (HOSPITAL_COMMUNITY): Payer: Medicare PPO | Admitting: Physical Therapy

## 2020-03-21 ENCOUNTER — Encounter (HOSPITAL_COMMUNITY): Payer: Medicare PPO | Admitting: Psychology

## 2020-03-21 ENCOUNTER — Inpatient Hospital Stay (HOSPITAL_COMMUNITY): Payer: Medicare PPO | Admitting: Speech Pathology

## 2020-03-21 ENCOUNTER — Inpatient Hospital Stay (HOSPITAL_COMMUNITY): Payer: Medicare PPO | Admitting: Occupational Therapy

## 2020-03-21 DIAGNOSIS — M542 Cervicalgia: Secondary | ICD-10-CM

## 2020-03-21 DIAGNOSIS — R7881 Bacteremia: Secondary | ICD-10-CM

## 2020-03-21 DIAGNOSIS — I69391 Dysphagia following cerebral infarction: Secondary | ICD-10-CM

## 2020-03-21 LAB — GLUCOSE, CAPILLARY
Glucose-Capillary: 89 mg/dL (ref 70–99)
Glucose-Capillary: 150 mg/dL — ABNORMAL HIGH (ref 70–99)
Glucose-Capillary: 105 mg/dL — ABNORMAL HIGH (ref 70–99)
Glucose-Capillary: 177 mg/dL — ABNORMAL HIGH (ref 70–99)

## 2020-03-21 MED ORDER — MUSCLE RUB 10-15 % EX CREA
TOPICAL_CREAM | CUTANEOUS | Status: DC | PRN
  Administered 2020-03-21: 1 via TOPICAL
  Filled 2020-03-21: qty 85

## 2020-03-21 MED ORDER — CLONAZEPAM 0.25 MG PO TBDP
0.2500 mg | ORAL_TABLET | Freq: Two times a day (BID) | ORAL | Status: DC | PRN
  Administered 2020-03-21 – 2020-04-07 (×25): 0.25 mg via ORAL
  Filled 2020-03-21 (×27): qty 1

## 2020-03-21 NOTE — Progress Notes (Signed)
Physical Therapy Session Note  Patient Details  Name: [name] MRN: [id number] Date of Birth: [birth year]  Today's Date: 03/21/2020 PT Individual Time: 0905-1000 PT Individual Time Calculation (min): 55 min   Short Term Goals: Week 3:  PT Short Term Goal 1 (Week 3): STG= LTG based on ELOS  Skilled Therapeutic Interventions/Progress Updates:   Pt received supine in bed and agreeable to PT. Supine>sit transfer with CGA  From PT for safety as well as min cues for awareness of LUE. {PT assisted pt to don socks EOB with total A for time management. No LOB noted in sitting. Stand pivot transfer to [facility] with min assist on the R side. Pt required cues to prevent sitting on arm rest.   Pt transported to orthogym in [place]. Car transfer training with min assist and RW with support on the L side to facilitated adequate weight shift intermittently to perform side step with the LLE. Min assist for LLE control into car with RUE supported on bar. Min assist with RW and hand hand support to transfer back to [facility] from car.   Gait training with RW 2 x 41f with min assist overall for AD management and min cues for improved awareness of L sided obstacles. Mild L lateral lean, but able to correct with cues.   Pt reports need for BM. [facility] unable to void. Pt returned to room and performed stand pivot transfer to bed with RUE supported on PT with min-mod assist to transfer to bed on the L. Sit>supine completed with supervision assist for safety and awareness of the LUE,  and left supine in bed with call bell in reach and all needs met.       Therapy Documentation Precautions:  Precautions Precautions: Fall Precaution Comments: L hemi, L inattention Restrictions Weight Bearing Restrictions: No    Vital Signs: Therapy Vitals Pulse Rate: (!) 58 Pain: Pain Assessment Pain Scale: Faces Pain Score: 8  Faces Pain Scale: No hurt Pain Type: Acute pain Pain Location:  Back Pain Orientation: Lower Pain Intervention(s): Medication (See eMAR)   Therapy/Group: Individual Therapy  ALorie Phenix11/26/2021, 12:23 PM

## 2020-03-21 NOTE — Progress Notes (Signed)
[place] PHYSICAL MEDICINE & REHABILITATION PROGRESS NOTE   Subjective/Complaints: Patient seen sitting up in bed this morning.  He states he slept well overnight.  No reported issues overnight.  He is irritable with orientation questions.  He asks me to give him a massage.  Later notified by nursing regarding patient's anxiety.  ROS: unable to assess due to cognition  Objective:   No results found. No results for input(s): WBC, HGB, HCT, PLT in the last 72 hours. Recent Labs    03/19/20 1513  NA 141  K 4.4  CL 107  CO2 25  GLUCOSE 152*  BUN 25*  CREATININE 1.22  CALCIUM 8.7*    Intake/Output Summary (Last 24 hours) at 03/21/2020 1153 Last data filed at 03/21/2020 0730 Gross per 24 hour  Intake 540 ml  Output --  Net 540 ml        Physical Exam: Vital Signs Blood pressure 133/69, pulse (!) 58, temperature 97.9 F (36.6 C), temperature source Oral, resp. rate 20, height 5\' 11"  (1.803 m), weight 68.1 kg, SpO2 100 %. Constitutional: No distress . Vital signs reviewed. HENT: Normocephalic.  Atraumatic. Eyes: EOMI. No discharge. Cardiovascular: No JVD.  RRR. Respiratory: Normal effort.  No stridor.  Bilateral clear to auscultation. GI: Non-distended.  BS +. Skin: Warm and dry.  Left shoulder dressing CDI Psych: Confused.  Irritable. Musc: Left shoulder with tenderness, no edema. Neurologic: Alert, agitated by orientation questions Dysphonia Motor:?  Left upper extremity shoulder abduction 2/5, distally 4 -/5 Left lower extremity: 4 -/5 proximal to distal  Assessment/Plan: 1. Functional deficits which require 3+ hours per day of interdisciplinary therapy in a comprehensive inpatient rehab setting.  Physiatrist is providing close team supervision and 24 hour management of active medical problems listed below.  Physiatrist and rehab team continue to assess barriers to discharge/monitor patient progress toward functional and medical goals  Care Tool:  Bathing     Body parts bathed by patient: Chest, Abdomen, Left arm, Front perineal area, Face, Right upper leg, Left upper leg   Body parts bathed by helper: Buttocks, Right lower leg, Left lower leg, Right arm Body parts n/a: Right upper leg, Left upper leg, Right lower leg, Left lower leg (did not attempt secondary to time)   Bathing assist Assist Level: Moderate Assistance - Patient 50 - 74%     Upper Body Dressing/Undressing Upper body dressing   What is the patient wearing?: Pull over shirt    Upper body assist Assist Level: Moderate Assistance - Patient 50 - 74%    Lower Body Dressing/Undressing Lower body dressing      What is the patient wearing?: Pants, Incontinence brief     Lower body assist Assist for lower body dressing: Maximal Assistance - Patient 25 - 49%     Toileting Toileting    Toileting assist Assist for toileting: Maximal Assistance - Patient 25 - 49%     Transfers Chair/bed transfer  Transfers assist     Chair/bed transfer assist level: Moderate Assistance - Patient 50 - 74%     Locomotion Ambulation   Ambulation assist      Assist level: Moderate Assistance - Patient 50 - 74% Assistive device: Walker-rolling Max distance: 153ft   Walk 10 feet activity   Assist     Assist level: Moderate Assistance - Patient - 50 - 74% Assistive device: Walker-rolling   Walk 50 feet activity   Assist Walk 50 feet with 2 turns activity did not occur: Safety/medical concerns (due  to patient fatigue/weakness)  Assist level: Moderate Assistance - Patient - 50 - 74% Assistive device: Walker-rolling    Walk 150 feet activity   Assist Walk 150 feet activity did not occur: Safety/medical concerns (due to patient fatigue/weakness)         Walk 10 feet on uneven surface  activity   Assist Walk 10 feet on uneven surfaces activity did not occur: Safety/medical concerns (due to patient fatigue/weakness)         Wheelchair     Assist Will  patient use wheelchair at discharge?: Yes Type of Wheelchair: Manual    Wheelchair assist level: Dependent - Patient 0% (TIS w/c) Max wheelchair distance: 150    Wheelchair 50 feet with 2 turns activity    Assist        Assist Level: Dependent - Patient 0%   Wheelchair 150 feet activity     Assist      Assist Level: Dependent - Patient 0%    Medical Problem List and Plan: 1. 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    Continue CIR 2.  Antithrombotics: -DVT/anticoagulation:  Pharmaceutical: Other (comment)--Eliquis.              -antiplatelet therapy: N/A--has been off ASA/Brilinta.  3. Chronic HA/Cervicalgia/Pain Management: Was on flexeril prn PTA. Will monitor N/A.   K pad and cervical spine XR given new neck pain and TTP on cervical spine. 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             Monitor with increased exertion  Muscle rub ordered on 11/26  4. Mood: LCSW to follow for evaluation and support.              -antipsychotic agents: Monitor for now.  5. Neuropsych: This patient is not capable of making decisions on his own behalf. 6. Skin/Wound Care: Routine pressure relief measures. Protein supplement due to evidence of malnutrition.  7. Fluids/Electrolytes/Nutrition: Monitor I/Os.   8. Gastroenteritis: Had multiple episodes of N/V on 11/09 with 4 incontinent stools and low grade fever. Monitor for GI virus. 9. HTN: Monitor BP educate on importance of compliance. SBP goal 130-150   Vitals:   03/21/20 0425 03/21/20 0841  BP: 133/69   Pulse: (!) 50 (!) 58  Resp: 20   Temp: 97.9 F (36.6 C)   SpO2: 100%    Relatively controlled on 11/26 10. ICM/Heart failure with persistent apical thrombus:  Monitor for signs and symptoms of fluid overload. Daily weights. Continue Eliquis 11. A fib: Monitor HR -continue Lanoxin and Eliquis.              Monitor with increased activity 12. Acute on chronic renal failure: Likely due to gastroenteritis.   Creatinine 1.22 on 11/24, labs ordered for Monday 13. Electrolytes abnormalities: Resolved post supplementation 14. Anemia: Recheck iron levels.                    Hemoglobin 9.7 on 11/22, labs ordered for Monday 15. T2DM with hyperglycemia: Hgb A1C-6.8.  probable neuropathy trial gabapentin low dose   Will monitor BS ac/hs.Add CM restrictions to diet.              Monitor with increased mobility CBG (last 3)  Recent Labs    03/20/20 2102 03/21/20 0605 03/21/20 1140  GLUCAP 166* 89 150*   Labile on 11/26, monitor trend 16. 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 MSSA bacteremia  TEE showed no valve vegetations, no other sig structural abnl in report cardiology to f/u with recs  IV cefazolin q 8h, to D/C on 04/05/20.   19.  Cognitive deficits from CVA +/- encephalopathy from bacteremia  Trial low dose Klonopin due to restlessness/anxiety LOS: 16 days A FACE TO FACE EVALUATION WAS PERFORMED  [name] 03/21/2020, 11:53 AM
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
## 2020-03-21 NOTE — Progress Notes (Signed)
Patient ID: [name], male   DOB: [birth year], 79 y.o.   MRN: [id number]   SW informed patient daughter of delayed d/c due to being unable to start auth due to holiday closing. sw will begin auth on Monday with anticipated d/c date on Wednesday.  Lavera Guise, BSW

## 2020-03-21 NOTE — Consult Note (Signed)
  03/21/2020: Entering the room today the patient was in a deep sleep and was very difficult to arouse. I spoke with his nurse and she stated that she believes he was up all night due to issues related to pain etc. The patient would fall back to sleep almost initially upon arousal and never became fully alert to allow for any type of significant interaction. The patient acknowledged difficulties coping with pain and other issues but beyond that it was very difficult to get much from him. I will try following up with the patient next week.    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 History taken from chart review due to cognition. He was found to have elevated BP with rhabdomyolysis and hypernatremia. 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  MRA brain revealed severe partially occlusive and/or recanalized thrombus in right M1 segment, moderate left P2 stenosis and MRA neck limited due to inability to tolerate exam. Echocardiogram with ejection fraction of 50% with elongated echodensity inferior apex consistent with mural thrombus with hypokinesis.   Per records has history of noncompliance and follows up with physicians at [facility]. He was started on IV heparin and Dr. Roda Shutters felt that stroke cardioembolic due to noncompliance with Eliquis.  Long term BP goal 130-150 give high grade R-MCA stenosis v/s occlusion. Agitation and confusion improving with use of prn Haldol.  Electrolyte abnormalities resolving  with hydration but he developed GI distress with lethargy and decline overall mobility on 03/04/2020 due to low-grade fever, multiple episodes of N/V as well as multiple stools. He was found to have leukocytosis with WBC 13.1 with elevated procalcitonin levels.  He has had reports of SOB as well as coughing with meals and Augmentin added today due to concerns of aspiration pneumonitis. Hospital course further complicated by post stroke dysphagia, started on dysphagia 1 nectar thick diet. Therapy has been ongoing and CIR was recommended due to functional decline.   Please see preadmission assessment from earlier today as well.

## 2020-03-21 NOTE — Progress Notes (Signed)
Occupational Therapy Weekly Progress Note  Patient Details  Name: [name] MRN: [id number] Date of Birth: [birth year]  Beginning of progress report period: March 14, 2020 End of progress report period: March 21, 2020  Today's Date: 03/21/2020 OT Individual Time: 1335-1415 OT Individual Time Calculation (min): 40 min    Patient has met 0 of 5 short term goals.  Pt is making slower progress than expected at this time.  He currently completes all bathing sit to stand at min assist level.  Both UB and LB dressing are completed at a mod assist level.  He continues to need mod assist for stand pivot transfers with use of the RW for support or without.  He still exhibits increased pushing to the left at times with min assist needed for dynamic sitting balance during dressing without UE support.  He continues to demonstrate moderate LUE weakness, currently at [facility] stage III level in the arm and hand.  He needs max hand over hand assist for integrated use to apply deodorant under the right arm or for washing the right arm.  [name] continues to demonstrate decreased orientation of time as well as decreased memory and awareness of his situation and deficits.  When asked why he is here he can state that he has had a stroke, but cannot state any deficits from the stroke itself.  Overall feel he will need continued CIR level therapy to continue progression toward overall min to mod assist level goals.  He will be discharging to SNF in [place] for further treatment with antibiotics as well as continued therapy later next week.  Recommend continued rehab until discharge with education provided to the daughter as able.      Patient continues to demonstrate the following deficits: muscle weakness and muscle paralysis, impaired timing and sequencing, abnormal tone, unbalanced muscle activation and decreased coordination, decreased attention to left, decreased attention, decreased awareness, decreased  problem solving, decreased safety awareness, decreased memory and delayed processing and decreased sitting balance, decreased standing balance, decreased postural control, hemiplegia and decreased balance strategies and therefore will continue to benefit from skilled OT intervention to enhance overall performance with BADL and Reduce care partner burden.  Patient not progressing toward long term goals.  See goal revision..  Continue plan of care.  OT Short Term Goals Week 3:  OT Short Term Goal 1 (Week 3): Continue working on established LTGs downgraded to mod assist overall.  Skilled Therapeutic Interventions/Progress Updates:    Pt in bed to start session, agreeable to participation in OT treatment.  He was able to transfer to the EOB with min guard assist.  He then completed stand pivot transfer to the wheelchair at mod assist level to the right with use of the RW with hand splint on the left for support.  Bathing and dressing was completed sit to stand at the sink with min assist overall for bathing and mod to max assist for dressing.  He was able to donn a pullover shirt with max assist following hemidressing techniques.  For donning pants, he needed mod assist as well as max assist for donning gripper socks.  Finished session with pt sitting up in the tilt in space wheelchair with the call button and phone in reach and safety belt in place.    Therapy Documentation Precautions:  Precautions Precautions: Fall Precaution Comments: L hemi, L inattention Restrictions Weight Bearing Restrictions: No  Pain:   ADL: See Care Tool Section for some details of mobility and  selfcare  Therapy/Group: Individual Therapy  [name],Genevieve OTR/L 03/21/2020, 6:09 PM

## 2020-03-21 NOTE — Progress Notes (Signed)
Speech Language Pathology Daily Session Note  Patient Details  Name: [name] MRN: [id number] Date of Birth: 19-Nov-1940  Today's Date: 03/21/2020 SLP Individual Time: 1101-1156 SLP Individual Time Calculation (min): 55 min  Short Term Goals: Week 2: SLP Short Term Goal 1 (Week 2): Pt will consume current diet with efficient mastication and oral clearance and minimal overt s/sx aspiration with Min A cueing to reduce oral holding and clear left pocketing. SLP Short Term Goal 2 (Week 2): Pt will consume therapeutic trials of thin H2O with minimal overt s/sx aspiration across 3 sessions to demonstrate readiness for repeat MBSS. SLP Short Term Goal 3 (Week 2): Pt will sustain attention to tasks and functional topics of conversation for 2-3 minute intervals with Mod A cues for redirection. SLP Short Term Goal 4 (Week 2): Pt will demonstrate ability to problem solve functional familiar situations with Mod A verbal/visual cues. SLP Short Term Goal 5 (Week 2): Pt will recall new and/or daily information related to therapies with Mod A cues for use of aids or strategies. SLP Short Term Goal 6 (Week 2): Pt will utilize compensatory strategies for speech intelligibility (increased vocal intensity and overarticulation) at the sentence level with Mod A cues.  Skilled Therapeutic Interventions: Pt was seen for skilled ST targeting dysphagia and cognitve-linguistic goals. SLP facilitated session with upgraded trial of Dys 2 (minced/ground) solid texture snack and thin liquids (recently upgraded after MBSS). He exhibited delayed coughing episodes X2, which is relatively consistent bedside presentation for pt and difficult to determine if linked to PO intake. Min A verbal cues provided for awareness and clearance of left buccal pocketing, as well as to refrain from adding additional POs to mouth when coughing episodes occur. Recommend a trial tray of Dys 2 textures prior to upgrade to assess his performance with  a full size meal. Recommend continue current diet for now, with full supervision. SLP further facilitated session with verbal and functional tasks addressing cognitive goals. He required fluctuating Min-Mod A verbal cueing for redirection to tasks throughout session due to internal distractibility. During a basic money management task, he sorted coins by type with Supervision A verbal and visual cues, but increased Mod A for error awareness, and problem solving. Pt also required Moderate assistance to problem solve use of his remote to turn on TV and find football channel. Pt's vocal intensity was generally greater today in comparison to previous visits, making his speech ~80% intelligible in conversation, but still required Min-mod cueing to implement this strategy to clarify unintelligible utterances. Pt left laying in bed with alarm set and needs within reach. Continue per current plan of care.          Pain Pain Assessment Pain Scale: Faces Faces Pain Scale: No hurt   Therapy/Group: Individual Therapy  [name] 03/21/2020, 12:01 PM

## 2020-03-22 ENCOUNTER — Inpatient Hospital Stay (HOSPITAL_COMMUNITY): Payer: Medicare PPO

## 2020-03-22 DIAGNOSIS — I69319 Unspecified symptoms and signs involving cognitive functions following cerebral infarction: Secondary | ICD-10-CM

## 2020-03-22 DIAGNOSIS — I69391 Dysphagia following cerebral infarction: Secondary | ICD-10-CM

## 2020-03-22 LAB — GLUCOSE, CAPILLARY
Glucose-Capillary: 89 mg/dL (ref 70–99)
Glucose-Capillary: 126 mg/dL — ABNORMAL HIGH (ref 70–99)
Glucose-Capillary: 137 mg/dL — ABNORMAL HIGH (ref 70–99)
Glucose-Capillary: 184 mg/dL — ABNORMAL HIGH (ref 70–99)

## 2020-03-22 NOTE — Progress Notes (Signed)
[place] PHYSICAL MEDICINE & REHABILITATION PROGRESS NOTE   Subjective/Complaints: Patient seen laying in bed this morning.  He is sleepy this morning and disengaged.  No reported issues overnight.  ROS: unable to assess due to cognition/behavior  Objective:   No results found. No results for input(s): WBC, HGB, HCT, PLT in the last 72 hours. Recent Labs    03/19/20 1513  NA 141  K 4.4  CL 107  CO2 25  GLUCOSE 152*  BUN 25*  CREATININE 1.22  CALCIUM 8.7*    Intake/Output Summary (Last 24 hours) at 03/22/2020 1231 Last data filed at 03/22/2020 0825 Gross per 24 hour  Intake 480 ml  Output 400 ml  Net 80 ml        Physical Exam: Vital Signs Blood pressure 133/71, pulse (!) 53, temperature 98 F (36.7 C), resp. rate 18, height 5\' 11"  (1.803 m), weight 68.1 kg, SpO2 100 %. Constitutional: No distress . Vital signs reviewed. HENT: Normocephalic.  Atraumatic. Eyes: EOMI. No discharge. Cardiovascular: No JVD.  RRR. Respiratory: Normal effort.  No stridor.  Bilateral clear to auscultation. GI: Non-distended.  BS +. Skin: Warm and dry.  Intact. Psych: Limited due to somnolence.  Disengaged. Musc: Left shoulder with tenderness.  No edema. Neurologic: Somnolent Dysphonia, unchanged Motor:?  Left upper extremity shoulder abduction 2/5, distally 4 -/5, stable Left lower extremity: 4 -/5 proximal to distal  Assessment/Plan: 1. Functional deficits which require 3+ hours per day of interdisciplinary therapy in a comprehensive inpatient rehab setting.  Physiatrist is providing close team supervision and 24 hour management of active medical problems listed below.  Physiatrist and rehab team continue to assess barriers to discharge/monitor patient progress toward functional and medical goals  Care Tool:  Bathing    Body parts bathed by patient: Chest, Abdomen, Left arm, Front perineal area, Face, Right upper leg, Left upper leg, Right lower leg, Left lower leg,  Buttocks   Body parts bathed by helper: Right arm Body parts n/a: Right upper leg, Left upper leg, Right lower leg, Left lower leg (did not attempt secondary to time)   Bathing assist Assist Level: Minimal Assistance - Patient > 75%     Upper Body Dressing/Undressing Upper body dressing   What is the patient wearing?: Pull over shirt    Upper body assist Assist Level: Maximal Assistance - Patient 25 - 49%    Lower Body Dressing/Undressing Lower body dressing      What is the patient wearing?: Pants     Lower body assist Assist for lower body dressing: Moderate Assistance - Patient 50 - 74%     Toileting Toileting    Toileting assist Assist for toileting: Maximal Assistance - Patient 25 - 49%     Transfers Chair/bed transfer  Transfers assist     Chair/bed transfer assist level: Moderate Assistance - Patient 50 - 74% (stand pivot) Chair/bed transfer assistive device:   Ambulation assist      Assist level: Moderate Assistance - Patient 50 - 74% Assistive device: Walker-rolling Max distance: 37ft   Walk 10 feet activity   Assist     Assist level: Moderate Assistance - Patient - 50 - 74% Assistive device: Walker-rolling   Walk 50 feet activity   Assist Walk 50 feet with 2 turns activity did not occur: Safety/medical concerns (due to patient fatigue/weakness)  Assist level: Moderate Assistance - Patient - 50 - 74% Assistive device: Walker-rolling    Walk 150 feet activity  Assist Walk 150 feet activity did not occur: Safety/medical concerns (due to patient fatigue/weakness)         Walk 10 feet on uneven surface  activity   Assist Walk 10 feet on uneven surfaces activity did not occur: Safety/medical concerns (due to patient fatigue/weakness)         Wheelchair     Assist Will patient use wheelchair at discharge?: Yes Type of Wheelchair: Manual    Wheelchair assist level: Dependent - Patient 0%  (TIS w/c) Max wheelchair distance: 150    Wheelchair 50 feet with 2 turns activity    Assist        Assist Level: Dependent - Patient 0%   Wheelchair 150 feet activity     Assist      Assist Level: Dependent - Patient 0%    Medical Problem List and Plan: 1. 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    Continue CIR 2.  Antithrombotics: -DVT/anticoagulation:  Pharmaceutical: Other (comment)--Eliquis.              -antiplatelet therapy: N/A--has been off ASA/Brilinta.  3. Chronic HA/Cervicalgia/Pain Management: Was on flexeril prn PTA. Will monitor N/A.   K pad and cervical spine XR given new neck pain and TTP on cervical spine. 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             Monitor with increased exertion  Muscle rub ordered on 11/26   Appears controlled with meds on 11/27 4. Mood: LCSW to follow for evaluation and support.              -antipsychotic agents: Monitor for now.  5. Neuropsych: This patient is not capable of making decisions on his own behalf. 6. Skin/Wound Care: Routine pressure relief measures. Protein supplement due to evidence of malnutrition.  7. Fluids/Electrolytes/Nutrition: Monitor I/Os.   8. Gastroenteritis: Had multiple episodes of N/V on 11/09 with 4 incontinent stools and low grade fever. Monitor for GI virus. 9. HTN: Monitor BP educate on importance of compliance. SBP goal 130-150   Vitals:   03/21/20 1830 03/22/20 0603  BP: 133/61 133/71  Pulse: (!) 58 (!) 53  Resp: 16 18  Temp: (!) 97 F (36.1 C) 98 F (36.7 C)  SpO2: 100% 100%   Relatively controlled on 11/27 10. ICM/Heart failure with persistent apical thrombus: Monitor for signs and symptoms of fluid overload. Daily  weights. Continue Eliquis 11. A fib: Monitor HR -continue Lanoxin and Eliquis.              Monitor with increased activity 12. Acute on chronic renal failure: Likely due to gastroenteritis.   Creatinine 1.22 on 11/24, labs ordered for Monday 13. Electrolytes abnormalities: Resolved post supplementation 14. Anemia: Recheck iron levels.                    Hemoglobin 9.7 on 11/22, labs ordered for Monday 15. T2DM with hyperglycemia: Hgb A1C-6.8.  probable neuropathy trial gabapentin low dose   Will monitor BS ac/hs.Add CM restrictions to diet.              Monitor with increased mobility CBG (last 3)  Recent Labs    03/21/20 2100 03/22/20 0616 03/22/20 1223  GLUCAP 177* 89 126*   Labile, but?  Improving on 11/27 16. 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 MSSA bacteremia  TEE showed no valve vegetations, no other sig structural abnl in report cardiology to f/u with recs  IV cefazolin q 8h, to D/C on 04/05/20.   19.  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  Improving LOS: 17 days A FACE TO FACE EVALUATION WAS PERFORMED  Ankit Karis Juba 03/22/2020, 12:31 PM

## 2020-03-22 NOTE — Progress Notes (Signed)
Physical Therapy Session Note  Patient Details  Name: [name] MRN: [id number] Date of Birth: [birth year]  Today's Date: 03/22/2020 PT Individual Time: 1000-1057 PT Individual Time Calculation (min): 57 min   Short Term Goals: Week 3:  PT Short Term Goal 1 (Week 3): STG= LTG based on ELOS  Skilled Therapeutic Interventions/Progress Updates:    Pt received supine in bed, sleeping with lights off, awakens easily to voice. Initially, declines OOB mobility or participation in functional mobility training. With minimal effort, he was redirectable. He c/o generalized back/neck pain, RN notified who provided pain medications. Pt noted to have saturated brief on arrival, pt incontinent of bladder, charted. He required totalA for brief management and he completed pericare with setupA. Donned clean brief with totalA and he required maxA for donning pants in supine with encouragement for participation. Donned shoes with totalA for time management. Supine<>sit with modA with HOB flat and use of bed rail, effortful. Able to sit EOB with close supervision and performed stand<>pivot transfer with modA to TIS w/c with cues for stepping pattern and safety approach. W/c transport for time management to dayroom gym. Performed stand<>pivot with min/modA to mat table from TIS w/c. He ambulated ~60ft with minA, progressing to modA due to L lateral lean, and RW on level surfaces. He will often times leave his LLE outside RW, especially turns, and has trouble correcting this with cues. Performed TUG, x3 trials, with minA and RW, scoring: 53 seconds, 47 seconds, and 55 seconds. Required cues for navigating turn around cone and also reminders for navigating back to starting point (mat table). Completed session with static standing toe taps on 4inch platform with RW support and min/modA guard, focusing on LLE coordination, standing balance, and endurance. He had difficulty clearing L foot at times and shows impaired hamstring  recruitment with sliding foot off rather than stepping it off, despite cues. Stand<>pivot with min/modA from mat table back to TIS w/c and he was returned to his room with safety belt alarm on, needs in reach.  Therapy Documentation Precautions:  Precautions Precautions: Fall Precaution Comments: L hemi, L inattention Restrictions Weight Bearing Restrictions: No  Therapy/Group: Individual Therapy  Christian P Manhard PT 03/22/2020, 10:58 AM

## 2020-03-22 NOTE — Plan of Care (Signed)
Patient is alert, forgetful and a high fall risk. Bed alarm in place. Patient on Eliquis for VTE, HOB elevated during meals/fluid intake for aspiration precautions. C/o headache and tylenol administered, Trazodone administered for sleep. Remains on IV antibiotics. Has a midline to LUE. Dressing is intact. Patient uses the urinal. Problem: Consults Goal: RH STROKE PATIENT EDUCATION Description: See Patient Education module for education specifics  Outcome: Progressing   Problem: RH BOWEL ELIMINATION Goal: RH STG MANAGE BOWEL WITH ASSISTANCE Description: STG Manage Bowel with Assistance. Min Outcome: Progressing   Problem: RH BLADDER ELIMINATION Goal: RH STG MANAGE BLADDER WITH ASSISTANCE Description: STG Manage Bladder With Min Assistance Outcome: Progressing   Problem: RH SKIN INTEGRITY Goal: RH STG SKIN FREE OF INFECTION/BREAKDOWN Description: Remain free of further breakdown with min assist Outcome: Progressing Goal: RH STG ABLE TO PERFORM INCISION/WOUND CARE W/ASSISTANCE Description: STG Able To Perform Incision/Wound Care With Assistance. Min assist Outcome: Progressing   Problem: RH SAFETY Goal: RH STG ADHERE TO SAFETY PRECAUTIONS W/ASSISTANCE/DEVICE Description: STG Adhere to Safety Precautions With Assistance/Device. Min assist Outcome: Progressing   Problem: RH COGNITION-NURSING Goal: RH STG USES MEMORY AIDS/STRATEGIES W/ASSIST TO PROBLEM SOLVE Description: STG Uses Memory Aids/Strategies With Assistance to Problem Solve. Min assist Outcome: Progressing   Problem: RH PAIN MANAGEMENT Goal: RH STG PAIN MANAGED AT OR BELOW PT'S PAIN GOAL Description: Less than 4 Outcome: Progressing   Problem: RH KNOWLEDGE DEFICIT Goal: RH STG INCREASE KNOWLEDGE OF DIABETES Description: Patient/Caregiver will be able to verbalize management of diabetes including medications, diet with cues/handouts Outcome: Progressing Goal: RH STG INCREASE KNOWLEDGE OF HYPERTENSION Description:  Patient/caregiver will be able to verbalize management of hypertension including monitoring blood pressure, medication and diet with cues/handouts Outcome: Progressing Goal: RH STG INCREASE KNOWLEDGE OF DYSPHAGIA/FLUID INTAKE Description: Patient/caregiver will be able to verbalize safe swallowing strategies with cues/handouts Outcome: Progressing

## 2020-03-23 ENCOUNTER — Encounter (HOSPITAL_COMMUNITY): Payer: Medicare PPO | Admitting: Occupational Therapy

## 2020-03-23 ENCOUNTER — Encounter (HOSPITAL_COMMUNITY): Payer: Medicare PPO

## 2020-03-23 ENCOUNTER — Ambulatory Visit (HOSPITAL_COMMUNITY): Payer: Medicare PPO

## 2020-03-23 LAB — GLUCOSE, CAPILLARY
Glucose-Capillary: 84 mg/dL (ref 70–99)
Glucose-Capillary: 132 mg/dL — ABNORMAL HIGH (ref 70–99)
Glucose-Capillary: 128 mg/dL — ABNORMAL HIGH (ref 70–99)
Glucose-Capillary: 132 mg/dL — ABNORMAL HIGH (ref 70–99)

## 2020-03-23 NOTE — Progress Notes (Signed)
[place] PHYSICAL MEDICINE & REHABILITATION PROGRESS NOTE   Subjective/Complaints: Patient seen laying in bed this Am.  He states he slept well overnight.  No reported issues.  He is somnolent.  ROS: unable to assess due to cognition/behavior.  Objective:   No results found. No results for input(s): WBC, HGB, HCT, PLT in the last 72 hours. No results for input(s): NA, K, CL, CO2, GLUCOSE, BUN, CREATININE, CALCIUM in the last 72 hours.  Intake/Output Summary (Last 24 hours) at 03/23/2020 1245 Last data filed at 03/23/2020 0815 Gross per 24 hour  Intake 580 ml  Output --  Net 580 ml        Physical Exam: Vital Signs Blood pressure (!) 133/56, pulse (!) 44, temperature 98 F (36.7 C), resp. rate 16, height 5\' 11"  (1.803 m), weight 66 kg, SpO2 98 %.  Constitutional: No distress . Vital signs reviewed. HENT: Normocephalic.  Atraumatic. Eyes: Keeps eyes closed. No discharge. Cardiovascular: No JVD.  RRR. Respiratory: Normal effort.  No stridor.  Bilateral clear to auscultation. GI: Non-distended.  BS +. Skin: Warm and dry.  Intact. Psych: Limited by somnolence, disengaged.  Musc: Left shoulder with tenderness. No edema. Neurologic: Somnolent Dysphonia, appears unchanged Motor:?  Left upper extremity shoulder abduction 2/5, distally 4 -/5, ?stable  Left lower extremity: 4 -/5 proximal to distal  Assessment/Plan: 1. Functional deficits which require 3+ hours per day of interdisciplinary therapy in a comprehensive inpatient rehab setting.  Physiatrist is providing close team supervision and 24 hour management of active medical problems listed below.  Physiatrist and rehab team continue to assess barriers to discharge/monitor patient progress toward functional and medical goals  Care Tool:  Bathing    Body parts bathed by patient: Chest, Abdomen, Left arm, Front perineal area, Face, Right upper leg, Left upper leg, Right lower leg, Left lower leg, Buttocks   Body parts  bathed by helper: Right arm Body parts n/a: Right upper leg, Left upper leg, Right lower leg, Left lower leg (did not attempt secondary to time)   Bathing assist Assist Level: Minimal Assistance - Patient > 75%     Upper Body Dressing/Undressing Upper body dressing   What is the patient wearing?: Pull over shirt    Upper body assist Assist Level: Maximal Assistance - Patient 25 - 49%    Lower Body Dressing/Undressing Lower body dressing      What is the patient wearing?: Pants     Lower body assist Assist for lower body dressing: Moderate Assistance - Patient 50 - 74%     Toileting Toileting    Toileting assist Assist for toileting: Maximal Assistance - Patient 25 - 49%     Transfers Chair/bed transfer  Transfers assist     Chair/bed transfer assist level: Moderate Assistance - Patient 50 - 74% (stand pivot) Chair/bed transfer assistive device:   Ambulation assist      Assist level: Moderate Assistance - Patient 50 - 74% Assistive device: Walker-rolling Max distance: 5ft   Walk 10 feet activity   Assist     Assist level: Moderate Assistance - Patient - 50 - 74% Assistive device: Walker-rolling   Walk 50 feet activity   Assist Walk 50 feet with 2 turns activity did not occur: Safety/medical concerns (due to patient fatigue/weakness)  Assist level: Moderate Assistance - Patient - 50 - 74% Assistive device: Walker-rolling    Walk 150 feet activity   Assist Walk 150 feet activity did not occur: Safety/medical concerns (  due to patient fatigue/weakness)         Walk 10 feet on uneven surface  activity   Assist Walk 10 feet on uneven surfaces activity did not occur: Safety/medical concerns (due to patient fatigue/weakness)         Wheelchair     Assist Will patient use wheelchair at discharge?: Yes Type of Wheelchair: Manual    Wheelchair assist level: Dependent - Patient 0% (TIS w/c) Max  wheelchair distance: 150    Wheelchair 50 feet with 2 turns activity    Assist        Assist Level: Dependent - Patient 0%   Wheelchair 150 feet activity     Assist      Assist Level: Dependent - Patient 0%    Medical Problem List and Plan: 1. 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    Continue CIR 2.  Antithrombotics: -DVT/anticoagulation:  Pharmaceutical: Other (comment)--Eliquis.              -antiplatelet therapy: N/A--has been off ASA/Brilinta.  3. Chronic HA/Cervicalgia/Pain Management: Was on flexeril prn PTA. Will monitor N/A.   K pad and cervical spine XR given new neck pain and TTP on cervical spine. 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             Monitor with increased exertion  Muscle rub ordered on 11/26   Appears controlled with meds on 11/28 4. Mood: LCSW to follow for evaluation and support.              -antipsychotic agents: Monitor for now.  5. Neuropsych: This patient is not capable of making decisions on his own behalf. 6. Skin/Wound Care: Routine pressure relief measures. Protein supplement due to evidence of malnutrition.  7. Fluids/Electrolytes/Nutrition: Monitor I/Os.   8. Gastroenteritis: Had multiple episodes of N/V on 11/09 with 4 incontinent stools and low grade fever. Monitor for GI virus. 9. HTN: Monitor BP educate on importance of compliance. SBP goal 130-150   Vitals:   03/22/20 2031 03/23/20 0354  BP: (!) 146/58 (!) 133/56  Pulse: (!) 57 (!) 44  Resp: 16 16  Temp: 98.7 F (37.1 C) 98 F (36.7 C)  SpO2: 98% 98%   Relatively controlled on 11/28 10. ICM/Heart failure with persistent apical thrombus: Monitor for signs and symptoms of fluid overload. Daily weights.  Continue Eliquis 11. A fib: Monitor HR -continue Lanoxin and Eliquis.              Monitor with increased activity 12. Acute on chronic renal failure: Likely due to gastroenteritis.   Creatinine 1.22 on 11/24, labs ordered for tomorrow 13. Electrolytes abnormalities: Resolved post supplementation 14. Anemia: Recheck iron levels.                    Hemoglobin 9.7 on 11/22, labs ordered for tomorrow 15. T2DM with hyperglycemia: Hgb A1C-6.8.  probable neuropathy trial gabapentin low dose   Will monitor BS ac/hs.Add CM restrictions to diet.              Monitor with increased mobility CBG (last 3)  Recent Labs    03/22/20 2147 03/23/20 0626 03/23/20 1158  GLUCAP 184* 84 132*   Labile on 11/28, monitor for trend 16. 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 MSSA bacteremia  TEE showed no valve vegetations, no other sig structural abnl in report cardiology to f/u with recs  IV cefazolin q 8h, to D/C on 04/05/20.   19.  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

## 2020-03-23 NOTE — Progress Notes (Signed)
Physical Therapy Session Note  Patient Details  Name: [name] MRN: [id number] Date of Birth: [birth year]  Today's Date: 03/23/2020 PT Individual Time: 1335-1345 PT Individual Time Calculation (min): 10 min   Short Term Goals: Week 3:  PT Short Term Goal 1 (Week 3): STG= LTG based on ELOS  Skilled Therapeutic Interventions/Progress Updates:     Returned after 20 min for second attempt at PT session, following patient refusal. Patient sitting EOB with RN preparing for peri-care with [place] due to patient with bladder incontinence upon PT arrival.   Patient performed sit to/from stand with min A in the [place] x3. Performed standing balance >2 min x2 focused on midline orientation due to mild L lean and use of L upper extremity for support. RN provided peri-care and performed lower body dressing with total A. Patient spontaneously stood while RN was threading his legs into the pants. Patient able to stand in R SLS >10 sec with B upper extremity support with min-mod A to allow RN to place his foot in the pants. Provided cues for lateral hip activation and erect posture in SLS. Placed patient in TIS w/c using the [facility] with cues for hand placement to control descent.   Patient in TIS w/c with breaks locked and handed off to [place], SLP at end of session.   Therapy Documentation Precautions:  Precautions Precautions: Fall Precaution Comments: L hemi, L inattention Restrictions Weight Bearing Restrictions: No General: PT Amount of Missed Time (min): 25 Minutes (after making up 10 min of missed time during second session)    Therapy/Group: Individual Therapy  Daneen Volcy L Tessica Cupo PT, DPT  03/23/2020, 5:02 PM

## 2020-03-23 NOTE — Progress Notes (Signed)
Occupational Therapy Session Note  Patient Details  Name: [name] MRN: [id number] Date of Birth: 1940/09/15  Today's Date: 03/23/2020 OT Individual Time: 1415-1445 OT Individual Time Calculation (min): 30 min  Missed 15 minutes of therapy session due to fatigue and refusal  Short Term Goals: Week 3:  OT Short Term Goal 1 (Week 3): Continue working on established LTGs downgraded to mod assist overall.  Skilled Therapeutic Interventions/Progress Updates:    patient seated in TIS w/c, alert and denies pain.  Daughter not present for planned family education session.  Patient agrees to complete practice with transfers this session with ongoing encouragement and verbal + tactile cues for each attempt to sequence and complete safely.  Overall mod A needed for repetitive SPT with RW transfers to/from w/c, arm chair and bed surfaces, significant lateral lean to the left, impulsive behavior noted at times.  Sit to supine with CS, he remained in bed at close of session, bed alarm set and call bell in hand.    Therapy Documentation Precautions:  Precautions Precautions: Fall Precaution Comments: L hemi, L inattention Restrictions Weight Bearing Restrictions: No   Therapy/Group: Individual Therapy  Barrie Lyme 03/23/2020, 7:46 AM

## 2020-03-23 NOTE — Progress Notes (Signed)
Physical Therapy Session Note  Patient Details  Name: [name] MRN: [id number] Date of Birth: 1940/05/07  Today's Date: 03/23/2020 PT Individual Time: 6468-0321 PT Individual Time Calculation (min): 10 min   Short Term Goals: Week 3:  PT Short Term Goal 1 (Week 3): STG= LTG based on ELOS  Skilled Therapeutic Interventions/Progress Updates:     Patient in bed asleep with B legs over the EOB upon PT arrival. Patient aroused to verbal and tactile stimulation. Patient initially agreed to getting dressed, performed scooting in the bed to bring his legs up on the bed and move away from the edge for safety with min A and cues for technique. Retrieved paper scrubs for patient to wear. Donned 1 sock with total A and patient yelled at PT "go away." PT provided orientation and asked patient about pain. Patient reported severe back pain and headache and declined mobility due to pain during session, RN made aware. PT provided repositioning, rest breaks, and distraction as pain interventions throughout session. PT offered to continue assisting the patient with dressing and noted lunch tray untouched in the room and offered assisting with patient eating EOB, patient declined all offers. Also declined eating in the bed at this time, stated, "I just want to be left alone." Unable to redirect patient at this time, will re-attempt at a later time if able. Patient missed 35 min of skilled PT due to pain/refusal, RN made aware. Will attempt to make-up missed time as able.    Patient in bed at end of session with breaks locked, bed alarm set, and all needs within reach.    Therapy Documentation Precautions:  Precautions Precautions: Fall Precaution Comments: L hemi, L inattention Restrictions Weight Bearing Restrictions: No General: PT Amount of Missed Time (min): 35 Minutes PT Missed Treatment Reason: Patient unwilling to participate   Therapy/Group: Individual Therapy  Mickelle Goupil L Bryce Cheever PT,  DPT  03/23/2020, 1:21 PM

## 2020-03-23 NOTE — Progress Notes (Signed)
Speech Language Pathology Daily Session Note  Patient Details  Name: [name] MRN: [id number] Date of Birth: 02-09-1941  Today's Date: 03/23/2020 SLP Individual Time: 4128-7867 SLP Individual Time Calculation (min): 28 min  Short Term Goals: Week 2: SLP Short Term Goal 1 (Week 2): Pt will consume current diet with efficient mastication and oral clearance and minimal overt s/sx aspiration with Min A cueing to reduce oral holding and clear left pocketing. SLP Short Term Goal 2 (Week 2): Pt will consume therapeutic trials of thin H2O with minimal overt s/sx aspiration across 3 sessions to demonstrate readiness for repeat MBSS. SLP Short Term Goal 3 (Week 2): Pt will sustain attention to tasks and functional topics of conversation for 2-3 minute intervals with Mod A cues for redirection. SLP Short Term Goal 4 (Week 2): Pt will demonstrate ability to problem solve functional familiar situations with Mod A verbal/visual cues. SLP Short Term Goal 5 (Week 2): Pt will recall new and/or daily information related to therapies with Mod A cues for use of aids or strategies. SLP Short Term Goal 6 (Week 2): Pt will utilize compensatory strategies for speech intelligibility (increased vocal intensity and overarticulation) at the sentence level with Mod A cues.  Skilled Therapeutic Interventions: Pt seen for skilled ST targeting dysphagia goals. Pt initially agitated following PT session, but agreeable for SLP to facilitate swallowing during lunch meal. Pt consuming 100% puree meal requiring mod A verbal cues to reduce rate of intake, increase frequency of liquid wash and utilize lingual sweep to check for pocketing in L buccal space. Pt with no s/s aspiration or change in vocal quality across meal. Thin liquid via cup and straw, both single and consecutive sip with no overt s/s aspiration. Due to patient's continued impulsivity and inability to carryover recommendations within tx session, continue to recommend  current diet of Dys 1/thin with upgraded trials with SLP as appropriate. Pt left upright in wheelchair with chair alarm on and call button within reach. Cont ST POC.   Pain Pain Assessment Pain Scale: 0-10 Pain Score: 0-No pain  Therapy/Group: Individual Therapy  Tacey Ruiz 03/23/2020, 2:03 PM

## 2020-03-24 ENCOUNTER — Inpatient Hospital Stay (HOSPITAL_COMMUNITY): Payer: Medicare PPO | Admitting: Speech Pathology

## 2020-03-24 ENCOUNTER — Inpatient Hospital Stay (HOSPITAL_COMMUNITY): Payer: Medicare PPO | Admitting: Occupational Therapy

## 2020-03-24 ENCOUNTER — Inpatient Hospital Stay (HOSPITAL_COMMUNITY): Payer: Medicare PPO

## 2020-03-24 LAB — GLUCOSE, CAPILLARY
Glucose-Capillary: 102 mg/dL — ABNORMAL HIGH (ref 70–99)
Glucose-Capillary: 119 mg/dL — ABNORMAL HIGH (ref 70–99)
Glucose-Capillary: 126 mg/dL — ABNORMAL HIGH (ref 70–99)
Glucose-Capillary: 147 mg/dL — ABNORMAL HIGH (ref 70–99)

## 2020-03-24 LAB — CBC
HCT: 31.9 % — ABNORMAL LOW (ref 39.0–52.0)
Hemoglobin: 10.3 g/dL — ABNORMAL LOW (ref 13.0–17.0)
MCH: 31 pg (ref 26.0–34.0)
MCHC: 32.3 g/dL (ref 30.0–36.0)
MCV: 96.1 fL (ref 80.0–100.0)
Platelets: 257 10*3/uL (ref 150–400)
RBC: 3.32 MIL/uL — ABNORMAL LOW (ref 4.22–5.81)
RDW: 12.3 % (ref 11.5–15.5)
WBC: 5.2 10*3/uL (ref 4.0–10.5)
nRBC: 0 % (ref 0.0–0.2)

## 2020-03-24 LAB — BASIC METABOLIC PANEL
Anion gap: 11 (ref 5–15)
BUN: 20 mg/dL (ref 8–23)
CO2: 25 mmol/L (ref 22–32)
Calcium: 9.1 mg/dL (ref 8.9–10.3)
Chloride: 103 mmol/L (ref 98–111)
Creatinine, Ser: 1.2 mg/dL (ref 0.61–1.24)
GFR, Estimated: >60 mL/min (ref >60)
Glucose, Bld: 129 mg/dL — ABNORMAL HIGH (ref 70–99)
Potassium: 3.9 mmol/L (ref 3.5–5.1)
Sodium: 139 mmol/L (ref 135–145)

## 2020-03-24 NOTE — Progress Notes (Signed)
[facility] PHYSICAL MEDICINE & REHABILITATION PROGRESS NOTE   Subjective/Complaints:  Pt receiving IV Ancef TID though 04/05/20 Pt somnolent but responsive this am, confirmed his home sleep schedule  (mainly sleeps in am)  ROS: unable to assess due to cognition/behavior.  Objective:   No results found. Recent Labs    03/24/20 0805  WBC 5.2  HGB 10.3*  HCT 31.9*  PLT 257   Recent Labs    03/24/20 0805  NA 139  K 3.9  CL 103  CO2 25  GLUCOSE 129*  BUN 20  CREATININE 1.20  CALCIUM 9.1    Intake/Output Summary (Last 24 hours) at 03/24/2020 1004 Last data filed at 03/24/2020 2458 Gross per 24 hour  Intake 397 ml  Output --  Net 397 ml        Physical Exam: Vital Signs Blood pressure 114/60, pulse (!) 56, temperature (!) 97.3 F (36.3 C), temperature source Oral, resp. rate 20, height 5\' 11"  (1.803 m), weight 68.3 kg, SpO2 97 %.   General: No acute distress Mood and affect are appropriate Heart: Regular rate and rhythm no rubs murmurs or extra sounds Lungs: Clear to auscultation, breathing unlabored, no rales or wheezes Abdomen: Positive bowel sounds, soft nontender to palpation, nondistended Extremities: No clubbing, cyanosis, or edema Skin: No evidence of breakdown, no evidence of rash   Dysphonia, appears unchanged Motor:?  Left upper extremity shoulder abduction 2/5, distally 4 -/5, ?stable  Left lower extremity: 4 -/5 proximal to distal  Assessment/Plan: 1. Functional deficits which require 3+ hours per day of interdisciplinary therapy in a comprehensive inpatient rehab setting.  Physiatrist is providing close team supervision and 24 hour management of active medical problems listed below.  Physiatrist and rehab team continue to assess barriers to discharge/monitor patient progress toward functional and medical goals  Care Tool:  Bathing    Body parts bathed by patient: Chest, Abdomen, Left arm, Front perineal area, Face, Right upper leg, Left  upper leg, Right lower leg, Left lower leg, Buttocks   Body parts bathed by helper: Right arm Body parts n/a: Right upper leg, Left upper leg, Right lower leg, Left lower leg (did not attempt secondary to time)   Bathing assist Assist Level: Minimal Assistance - Patient > 75%     Upper Body Dressing/Undressing Upper body dressing   What is the patient wearing?: Pull over shirt    Upper body assist Assist Level: Maximal Assistance - Patient 25 - 49%    Lower Body Dressing/Undressing Lower body dressing      What is the patient wearing?: Pants     Lower body assist Assist for lower body dressing: Moderate Assistance - Patient 50 - 74%     Toileting Toileting    Toileting assist Assist for toileting: Maximal Assistance - Patient 25 - 49%     Transfers Chair/bed transfer  Transfers assist     Chair/bed transfer assist level: Moderate Assistance - Patient 50 - 74% (stand pivot) Chair/bed transfer assistive device:   Ambulation assist      Assist level: Moderate Assistance - Patient 50 - 74% Assistive device: Walker-rolling Max distance: 29ft   Walk 10 feet activity   Assist     Assist level: Moderate Assistance - Patient - 50 - 74% Assistive device: Walker-rolling   Walk 50 feet activity   Assist Walk 50 feet with 2 turns activity did not occur: Safety/medical concerns (due to patient fatigue/weakness)  Assist level: Moderate Assistance -  Patient - 50 - 74% Assistive device: Walker-rolling    Walk 150 feet activity   Assist Walk 150 feet activity did not occur: Safety/medical concerns (due to patient fatigue/weakness)         Walk 10 feet on uneven surface  activity   Assist Walk 10 feet on uneven surfaces activity did not occur: Safety/medical concerns (due to patient fatigue/weakness)         Wheelchair     Assist Will patient use wheelchair at discharge?: Yes Type of Wheelchair: Manual     Wheelchair assist level: Dependent - Patient 0% (TIS w/c) Max wheelchair distance: 150    Wheelchair 50 feet with 2 turns activity    Assist        Assist Level: Dependent - Patient 0%   Wheelchair 150 feet activity     Assist      Assist Level: Dependent - Patient 0%    Medical Problem List and Plan: 1. 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    Continue CIR PT, OT, SLP 2.  Antithrombotics: -DVT/anticoagulation:  Pharmaceutical: Other (comment)--Eliquis.              -antiplatelet therapy: N/A--has been off ASA/Brilinta.  3. Chronic HA/Cervicalgia/Pain Management: Was on flexeril prn PTA. Will monitor N/A.   K pad and cervical spine XR given new neck pain and TTP on cervical spine. 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             Monitor with increased exertion  Muscle rub ordered on 11/26   Appears controlled with meds on 11/29 4. Mood: LCSW to follow for evaluation and support.              -antipsychotic agents: Monitor for now.  5. Neuropsych: This patient is not capable of making decisions on his own behalf. 6. Skin/Wound Care: Routine pressure relief measures. Protein supplement due to evidence of malnutrition.  7. Fluids/Electrolytes/Nutrition: Monitor I/Os.   8. Gastroenteritis: Had multiple episodes of N/V on 11/09 with 4 incontinent stools and low grade fever. Monitor for GI virus. 9. HTN: Monitor BP educate on importance of compliance. SBP goal 130-150   Vitals:   03/23/20 1913 03/24/20 0505  BP: (!) 146/68 114/60  Pulse: 70 (!) 56  Resp: 18 20  Temp: 97.8 F (36.6 C) (!) 97.3 F (36.3 C)  SpO2: 100% 97%   Relatively controlled on 11/29 10. ICM/Heart failure with persistent  apical thrombus: Monitor for signs and symptoms of fluid overload. Daily weights. Continue Eliquis 11. A fib: Monitor HR -continue Lanoxin and Eliquis.              Monitor with increased activity 12. Acute on chronic renal failure: Likely due to gastroenteritis.   Creatinine 1.22 on 11/24, labs ordered for tomorrow 13. Electrolytes abnormalities: Resolved post supplementation 14. Anemia: Recheck iron levels.                    Hemoglobin 9.7 on 11/22, labs ordered for tomorrow 15. T2DM with hyperglycemia: Hgb A1C-6.8.  probable neuropathy trial gabapentin low dose   Will monitor BS ac/hs.Add CM restrictions to diet.              Monitor with increased mobility CBG (last 3)  Recent Labs    03/23/20  1642 03/23/20 2059 03/24/20 0615  GLUCAP 128* 132* 102*   Well controlled  16. 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 MSSA bacteremia  TEE showed no valve vegetations, no other sig structural abnl in report cardiology to f/u with recs  IV cefazolin q 8h, to D/C on 04/05/20.   19.  Cognitive deficits from CVA +/- encephalopathy from bacteremia  Trial low dose Klonopin prn due to reports of restlessness/anxiety  Will consider long acting medication if persistent LOS: 19 days A FACE TO FACE EVALUATION WAS PERFORMED  Erick Colace 03/24/2020, 10:04 AM

## 2020-03-24 NOTE — Discharge Summary (Signed)
Physician Discharge Summary  Patient ID: [name] MRN: [id number] DOB/AGE: 05-24-1940 79 y.o.  Admit date: 03/05/2020 Discharge date 04/24/2020  Discharge Diagnoses:  Principal Problem:   Acute right MCA stroke [facility]) Active Problems:   Type II diabetes mellitus (HCC)   MSSA bacteremia   Protein-calorie malnutrition, severe   Neck pain   Bacteremia   Dysphagia, post-stroke   Cognitive deficit, post-stroke   Discharged Condition: stable   Significant Diagnostic Studies: CT HEAD WO CONTRAST  Result Date: 04/20/2020 CLINICAL DATA:  Head trauma.  Hit his head against the wall. EXAM: CT HEAD WITHOUT CONTRAST TECHNIQUE: Contiguous axial images were obtained from the base of the skull through the vertex without intravenous contrast. COMPARISON:  CT head February 29, 2020. FINDINGS: Brain: Evolving right MCA territory infarcts with decreased mass effect and progressive encephalomalacia. No evidence of acute hemorrhage. No evidence of interval/new acute large vascular territory infarct. Remote lacunar infarcts in the right cerebellum. Additional patchy white matter hypoattenuation, most likely related to chronic microvascular ischemic disease. No hydrocephalus. No mass lesion. No midline shift. Vascular: Calcific atherosclerosis. Skull: No acute fracture. Sinuses/Orbits: Visualized sinuses are clear.  Unremarkable orbits. Other: No mastoid effusions. IMPRESSION: 1. No evidence of acute intracranial abnormality. 2. Evolving right MCA territory infarcts. 3. Remote right cerebellar lacunar infarcts and chronic microvascular ischemic disease. Electronically Signed   By: Feliberto Harts MD   On: 04/20/2020 14:26    Labs:  Basic Metabolic Panel: BMP Latest Ref Rng & Units 04/22/2020 04/21/2020 04/15/2020  Glucose 70 - 99 mg/dL 696(E) 952(W) 413(K)  BUN 8 - 23 mg/dL 44(W) 10(U) 72(Z)  Creatinine 0.61 - 1.24 mg/dL 3.66(Y) 4.03(K) 7.42(V)  Sodium 135 - 145 mmol/L 142 141 141  Potassium 3.5 -  5.1 mmol/L 4.3 4.4 4.2  Chloride 98 - 111 mmol/L 108 107 109  CO2 22 - 32 mmol/L 25 24 22   Calcium 8.9 - 10.3 mg/dL ) 9.2 8.9    CBC: Recent Labs  Lab 04/20/20 0559 04/21/20 0613 04/22/20 0615  WBC 5.2 5.3 4.5  HGB 10.8* 10.5* 10.3*  HCT 31.5* 31.6* 30.5*  MCV 92.6 93.5 92.7  PLT 220 210 202    CBG: Recent Labs  Lab 04/20/20 1643 04/20/20 2059 04/21/20 0602 04/21/20 1209 04/22/20 0615  GLUCAP 244* 189* 112* 138* 92    Brief HPI:   [name] is a 79 y.o. 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  He had not been seen by friends for 3 days and was found on the floor with left hemiparesis, facial droop and multiple abrasions.  He was found to have elevated blood pressure with rhabdomyolysis and hyponatremia.  MRI brain done revealing acute to subacute ischemic right MCA territory infarct involving right basal ganglia with associated petechial hemorrhage and right frontal lobe infarct with chronic small vessel disease.  MRI brain revealed severe partially occlusive and or recanalized thrombus in right M1 segment and moderate left P2 stenosis.  2D echo showed EF of 50% elongated echodensity at the inferior apex consistent with mural thrombus with hypokinesis  Dr. 13/07/2019 felt that stroke was cardioembolic due to noncompliance with Eliquis and long-term BP goal 130-150 given high-grade right MCA stenosis versus occlusion.  Hospital course significant for issues with agitation and confusion requiring Haldol, electrolyte abnormality, GI distress with multiple episodes of N/V with diarrhea as well as low-grade  fevers with leukocytosis.  He was reported to have coughing with meals as well as shortness of breath therefore was started on Augmentin prior to discharge due to concern of aspiration pneumonitis.  He was maintained on dysphagia 1  with nectar liquids.  Therapy was ongoing and CIR was recommended due to functional decline.   Hospital Course: [name] was admitted to rehab 03/05/2020 for inpatient therapies to consist of PT, ST and OT at least three hours five days a week. Past admission physiatrist, therapy team and rehab RN have worked together to provide customized collaborative inpatient rehab.  He was maintained on Eliquis during his stay.  Acute on chronic neck pain treated with local measures and follow-up x-rays done showed diffuse cervical spine degenerative changes.  Muscle rub was scheduled 4 times daily and has help with pain management. Blood pressures were monitored on TID basis and has been stable and ranging in goal range of 130-150 to prevent hypoperfusion.  His diabetes has been monitored with ac/hs CBG checks and SSI was use prn for tighter BS control.  Blood sugars are relatively controlled on diet alone. Blood cultures drawn prior to transfer to CIR showed 3 of 3 bottles growing MSSA and IV cefazolin added for treatment. Respiratory status has been stable and follow-up chest x-ray WNL therefore Augmentin was discontinued.   ID has followed for input and recommended 4 weeks of antibiotic regimen  due to unclear cause of bacteremia as TEE was negative for vegetations and this was completed on 04/05/20. He continues to have bouts of lethargy and further investigation revealed sleep wake disruption.  Seroquel was added at bedtime to help with sleep hygiene. Patient reported to have history of ETOH abuse and bouts of agitation has improved with addition of librium. This was changed to Celexa to help with mood stabilization.  His progress in rehab has been limited due to variable participation as well as bouts of agitation.  He continues to be limited by left hemiplegia with left inattention as well as cognitive linguistic deficits.  His length of stay was extended pending disposition.  Family is unable to provide care  needed and has elected on [facility] to provide care needed. He was discharged on 04/23/20   Rehab course: During patient's stay in rehab weekly team conferences were held to monitor patient's progress, set goals and discuss barriers to discharge. At admission, patient required max assist with mobility and total assist with ADL task.  He exhibited moderately severe oropharyngeal dysphagia with recommendations to consume cues only when fully alert.  He also exhibited moderate cognitive impairment with mild dysarthria, slow speech and was oriented to self only. He has had improvement in activity tolerance, balance, postural control as well as ability to compensate for deficits. He requires mod to max assist with ADL tasks. He requires mod to max assist for transfers and min assist to ambulate >200' with RW. He requires mod assist with basic functional tasks due to improvements in problem solving, attention, STM deficits and poor frustration tolerance with inconsistent participation.    Disposition: Personal care home.    Diet: Dysphagia 3, thin liquids.   Special Instructions: 1. Blood pressure goal 130-150 per neurology to prevent hypoperfusion of brain. 2. K pad to back prn for pain control.  3. Needs intermittent supervision with meals --check for left pocketing.  4. Monitor blood sugars ac/hs and follow up with PCP for input.    Allergies as of 04/22/2020  No Known Allergies     Medication List    STOP taking these medications   amoxicillin-clavulanate 400-57 MG/5ML suspension Commonly known as: AUGMENTIN   atorvastatin 80 MG tablet Commonly known as: LIPITOR   cyclobenzaprine 5 MG tablet Commonly known as: FLEXERIL   insulin aspart 100 UNIT/ML injection Commonly known as: novoLOG     TAKE these medications   acetaminophen 325 MG tablet Commonly known as: TYLENOL Take 1-2 tablets (325-650 mg total) by mouth every 4 (four) hours as needed for mild  pain.   ascorbic acid 250 MG tablet Commonly known as: VITAMIN C Take 1 tablet (250 mg total) by mouth daily. Start taking on: April 23, 2020   citalopram 10 MG tablet Commonly known as: CELEXA Take 1 tablet (10 mg total) by mouth daily. Start taking on: April 23, 2020   digoxin 0.125 MG tablet Commonly known as: LANOXIN Take 1 tablet (0.125 mg total) by mouth daily.   Eliquis 5 MG Tabs tablet Generic drug: apixaban Take 1 tablet (5 mg total) by mouth 2 (two) times daily.   nitroGLYCERIN 0.4 MG SL tablet Commonly known as: NITROSTAT PLACE 1 TABLET UNDER THE TONGUE AS DIRECTED. What changed: See the new instructions.   QUEtiapine 100 MG tablet Commonly known as: SEROQUEL Take 1 tablet (100 mg total) by mouth at bedtime.   traMADol 50 MG tablet Commonly known as: ULTRAM Take 1 tablet (50 mg total) by mouth every 6 (six) hours as needed.       Follow-up Information    Deatra Ryden, MD .   Specialty: Family Medicine Contact information: [address] [name] [address] [phone]               Signed: Jacquelynn Cree 04/22/2020, 4:14 PM

## 2020-03-24 NOTE — Progress Notes (Signed)
Speech Language Pathology Weekly Progress and Session Note  Patient Details  Name: [name] MRN: [id number] Date of Birth: January 15, 1941  Beginning of progress report period: March 13, 2020 End of progress report period: March 24, 2020  Today's Date: 03/24/2020 SLP Individual Time: 1300-1400 SLP Individual Time Calculation (min): 60 min  Short Term Goals: Week 2: SLP Short Term Goal 1 (Week 2): Pt will consume current diet with efficient mastication and oral clearance and minimal overt s/sx aspiration with Min A cueing to reduce oral holding and clear left pocketing. SLP Short Term Goal 1 - Progress (Week 2): Met SLP Short Term Goal 2 (Week 2): Pt will consume therapeutic trials of thin H2O with minimal overt s/sx aspiration across 3 sessions to demonstrate readiness for repeat MBSS. SLP Short Term Goal 2 - Progress (Week 2): Met SLP Short Term Goal 3 (Week 2): Pt will sustain attention to tasks and functional topics of conversation for 2-3 minute intervals with Mod A cues for redirection. SLP Short Term Goal 3 - Progress (Week 2): Met SLP Short Term Goal 4 (Week 2): Pt will demonstrate ability to problem solve functional familiar situations with Mod A verbal/visual cues. SLP Short Term Goal 4 - Progress (Week 2): Met SLP Short Term Goal 5 (Week 2): Pt will recall new and/or daily information related to therapies with Mod A cues for use of aids or strategies. SLP Short Term Goal 5 - Progress (Week 2): Progressing toward goal SLP Short Term Goal 6 (Week 2): Pt will utilize compensatory strategies for speech intelligibility (increased vocal intensity and overarticulation) at the sentence level with Mod A cues. SLP Short Term Goal 6 - Progress (Week 2): Progressing toward goal    New Short Term Goals: Week 3: SLP Short Term Goal 1 (Week 3): STG=LTG due to remaining length of stay  Weekly Progress Updates: Pt is making slow gains, and goals were downgraded this week, as a result of  plateau in progress as well as plan to d/c to SNF earliuer than his originally anticipated d/c date. Pt currently requires Moderate assist for basic familiar tasks due to cognitive impairments impacting his attention, problem solving, intellectual and safety awareness, and short term memory. As a result, he will require strict 24/7 supervision and hands on care at discharge. Pt is consuming a puree (dys 1) texture diet with thin liquids and uses strategies for oral clearance with min a verbal cues from clinician. His speech is ~80% intelligible in conversation mainly due to low vocal intensity and mild articulatory imprecision; Min-Mod A cueing required for implementation of strategies to compensate for dysarthria. Pt has demonstrated improved sustained attention, oropharyngeal swallow function, and orientation. However, given significant cognitive-linguistic and oral phrase swallow deficits still present, recommend pt continue to receive skilled ST services upon discharge. SNF is recommended by ST, given severity of his deficits and likely longer course of neurocognitive rehabilitation and oral dysphagia interventions indicated. Although therapy has expressed need for family education regarding current level of functioning, no family has been present for education to date. Would recommend extensive education regarding cognitive-linguistic and swallow function prior to any d/c home from SNF.      Intensity: Minumum of 1-2 x/day, 30 to 90 minutes Frequency: 3 to 5 out of 7 days Duration/Length of Stay: 03/26/20 Treatment/Interventions: Cognitive remediation/compensation;Cueing hierarchy;Functional tasks;Patient/family education;Therapeutic Activities;Speech/Language facilitation;Dysphagia/aspiration precaution training;Internal/external aids   Daily Session  Skilled Therapeutic Interventions: Pt was seen for skilled ST targeting dysphagia and cognitive goals. Pt requesting to  each lunch upon arrival,  therefore set up and Supervision A verbal cues provided for compensatory swallow strategies for oral clearance during intake of puree (dys 1) textures and thin liquids from lunch tray. One instance of delayed coughing noted during intake. Recommend continue current diet. Mod increasing to Max A multimodal cues required for sustained attention to structured and functional tasks. He sequenced numbers on a calendar during a basic calendar creation task with Supervision A verbal cues for problem solving. Pt became increasingly restless during session, attempting to swat at therapist and stand from edge of bed with Max A verbal cueing required to reduce impulsivity and wait for additional assistance to arrive. Mod A required for sequencing, problem solving, recall, and awareness during transfer from bed to stedy and stedy to toilet, Max A for redirection to task. Pt handed off to NT X2 to finish with toileting. Continue per current plan of care.            Pain Pain Assessment Pain Scale: Faces Pain Score: 9  Faces Pain Scale: Hurts whole lot Pain Type: Acute pain Pain Location: Head Pain Orientation: Right;Left Pain Descriptors / Indicators: Headache Pain Frequency: Intermittent Pain Onset: On-going Pain Intervention(s): Other (Comment) (Pt medicated prior to session per RN)  Therapy/Group: Individual Therapy  Arbutus Leas 03/24/2020, 3:04 PM

## 2020-03-24 NOTE — Progress Notes (Signed)
Occupational Therapy Session Note  Patient Details  Name: [name] MRN: [id number] Date of Birth: 08/10/1940  Today's Date: 03/24/2020 OT Individual Time: 2919-1660 OT Individual Time Calculation (min): 56 min   Short Term Goals: Week 3:  OT Short Term Goal 1 (Week 3): Continue working on established LTGs downgraded to mod assist overall.  Skilled Therapeutic Interventions/Progress Updates:     Pt received side-lying in bed with all four bed rails up, requesting to get to TIS w/c. Session focused on toileting and neuro re-ed in prep for improved ADL/IADL and func mobility performance. Completed sup<>sit EOB with mod A to move BLE off bed and to lift trunk. Performed stand-pivot t/f 3x throughout session with mod A to shift weight from RLE to LLE and for L foot placement. Pt verbalized need to use restroom, req mod A for toileting (to pull pants/briefs up and down), pt is close supervision with posterior peri-area care but cont to perseverate on retrieving excess toilet paper. Transported to gym in [place] 2/2 energy conservation and time constraints. At table, pt completed sit<>stand x1 with mod A for initial lift and for L foot placement. Maintained static standing balance for ~3 min. Attempted table-top tasks in standing to target LUE NMR, however, pt attempted to eat therapy materials demonstrating increased confusion. Seated in TIS, pt able to complete ~10 forward towel pushes targeting L shoulder flexion and L elbow extension using BUE req mod A to facilitate shoulder flexion. Pt cont to be agitated during afternoon session requesting to be returned to bed throughout therapy. Pt performed hand hygeine with alcohol gel using min A for thoroughness of task on bilat hands. Stand-pivot with mod A to return to bed. At bed level completed ~20 reps of shoulder flexion/extension + elbow flexion/extension in gravity eliminated plain with max A to lift LUE. Demonstrates increased motor activity in LUE in  antigravity position vs against gravity. Pt left side-lying in bed with bed alarm engaged, call bell in reach, and all immediate needs met.   Therapy Documentation Precautions:  Precautions Precautions: Fall Precaution Comments: L hemi, L inattention Restrictions Weight Bearing Restrictions: No Pain: Pain Assessment Pain Scale: Faces Faces Pain Scale: Hurts even more Pain Type: Acute pain Pain Location: Head Pain Descriptors / Indicators: Aching Pain Frequency: Intermittent Pain Onset: On-going Pain Intervention(s): Rest   Therapy/Group: Individual Therapy  Valma Cava 03/24/2020, 9:17 PM

## 2020-03-24 NOTE — Progress Notes (Signed)
Occupational Therapy Session Note  Patient Details  Name: [name] MRN: [id number] Date of Birth: 1940-07-22  Today's Date: 03/24/2020 OT Individual Time: 9371-6967 OT Individual Time Calculation (min): 55 min   Short Term Goals: Week 3:  OT Short Term Goal 1 (Week 3): Continue working on established LTGs downgraded to mod assist overall.  Skilled Therapeutic Interventions/Progress Updates:    Pt received semi-reclined in bed, bed alarm engaged, requesting to use bathroom. Completed sup<>sit EOB with mod A to lift trunk and place BLE off bed. 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 Completed BSC t/f 2x during sesison. Stand-pivot t/f from EOB to [facility] with max A d/t L pusher's syndrome + placement of L foot in prep for standing. Pt pulls down pants/brief with min A in standing. Pt did not void/have BM while seated on BSC, req max A to pull pants/brief over hips in standing. Returned to bed via stand-pivot with max A. While semi-reclined in bed, pt completed 10 reps of L shoulder protraction/retraction and 10 reps of L shoulder flexion in prep for improved ADL performance. Pt demonstrated increased agitation this session, c/o of severe headache. Per RN, pt received pain meds prior to session. Attempted use of aromatherapy (lavender on pillow) + use of pt preferred music Merry Proud) to decrease agitation. Pt repeatedly requesting to be left alone. Donned pants at bed level with mod A to pull pants over hips. At bed level, additionally used L hand to squeeze lotion into R hand with max A to place tube and apply pressure. Pt then applied lotion to BLE with supervision using R hand. Donned socks with max A to thread bilat feet. Pt stated that he enjoys reading and was provided with newspaper for occupational enrichment. Pt left semi-reclined in bed with bed alarm engaged, call bell in reach, and all immediate needs met.   Therapy  Documentation Precautions:  Precautions Precautions: Fall Precaution Comments: L hemi, L inattention Restrictions Weight Bearing Restrictions: No  Pain: Pain Assessment Pain Scale: Faces Pain Score: 9  Faces Pain Scale: Hurts whole lot Pain Type: Acute pain Pain Location: Head Pain Orientation: Right;Left Pain Descriptors / Indicators: Headache Pain Frequency: Intermittent Pain Onset: On-going Pain Intervention(s): Other (Comment) (Pt medicated prior to session per RN)   ADL: See care tool for more details.  Therapy/Group: Individual Therapy  Lulabelle Desta A Dayzee Trower, OTR/L 03/24/2020, 12:19 PM

## 2020-03-24 NOTE — Progress Notes (Signed)
Patient ID: [name], male   DOB: 1941/02/05, 79 y.o.   MRN: [id number]   Patient authorization started. Case number:[id number]. Clinicals faxed to [employer] at [phone].  [place], [place] [phone]

## 2020-03-24 NOTE — Progress Notes (Signed)
Physical Therapy Session Note  Patient Details  Name: [name] MRN: [id number] Date of Birth: April 16, 1941  Today's Date: 03/24/2020 PT Individual Time: 1115-1155 PT Individual Time Calculation (min): 40 min   Short Term Goals: Week 3:  PT Short Term Goal 1 (Week 3): STG= LTG based on ELOS  Skilled Therapeutic Interventions/Progress Updates:    Patient received transferring to [facility] with NTs present using Stedy. Once seated on commode, patient requesting to transfer into bathroom to use commode there. Stedy used for this transfer for ease of transition. Patient maintaining slight L lateral lean when seated on Stedy requiring CGA for safety during transfer. Patient unable to void or have a bowel movement at this time. He was able to stand in [place] with CGA using R UE on Stedy bar. Minimal engagement for L UE noted despite encouragement to do so. Patient transferring back to bed per patient request. He began to perseverate on ice cream. PT retrieving ice cream and providing full supervision while patient sat edge of bed to eat ice cream. Persistent L lateral lean noted. Patient able to use L UE to hold ice cream cup with MaxA from PT place cup there. Patient demonstrating decreased impulsivity with intake, swallowing ice cream in his mouth before taking another bite. Consistent MinA provided for dynamic sitting balance edge of bed. Patient requiring MinA to return supine. Bed alarm on, call light within reach.   Therapy Documentation Precautions:  Precautions Precautions: Fall Precaution Comments: L hemi, L inattention Restrictions Weight Bearing Restrictions: No    Therapy/Group: Individual Therapy  Elizebeth Koller, PT, DPT, CBIS  03/24/2020, 7:54 AM

## 2020-03-24 NOTE — Progress Notes (Deleted)
Occupational Therapy Session Note  Patient Details  Name: [name] MRN: [id number] Date of Birth: 1940-07-18  Today's Date: 03/24/2020 OT Individual Time: 7322-0254 OT Individual Time Calculation (min): 55 min    Short Term Goals: Week 3:  OT Short Term Goal 1 (Week 3): Continue working on established LTGs downgraded to mod assist overall.  Skilled Therapeutic Interventions/Progress Updates:    Pt received side-lying in bed with all four bed rails up, requesting to get to TIS w/c. Session focused on toileting and neuro re-ed in prep for improved ADL/IADL and func mobility performance. Completed sup<>sit EOB with mod A to move BLE off bed and to lift trunk. Performed stand-pivot t/f 3x throughout session with mod A to shift weight from RLE to LLE and for L foot placement. Pt verbalized need to use restroom, req mod A for toileting (to pull pants/briefs up and down), pt is close supervision with posterior peri-area care but cont to perseverate on retrieving excess toilet paper. Transported to gym in [place] 2/2 energy conservation and time constraints. At table, pt completed sit<>stand x1 with mod A for initial lift and for L foot placement. Maintained static standing balance for ~3 min. Attempted table-top tasks in standing to target LUE NMR, however, pt attempted to eat therapy materials demonstrating increased confusion. Seated in TIS, pt able to complete ~10 forward towel pushes targeting L shoulder flexion and L elbow extension using BUE req mod A to facilitate shoulder flexion. Pt cont to be agitated during afternoon session requesting to be returned to bed throughout therapy. Pt performed hand hygeine with alcohol gel using min A for thoroughness of task on bilat hands. Stand-pivot with mod A to return to bed. At bed level completed ~20 reps of shoulder flexion/extension + elbow flexion/extension in gravity eliminated plain with max A to lift LUE. Demonstrates increased motor activity in LUE in  antigravity position vs against gravity. Pt left side-lying in bed with bed alarm engaged, call bell in reach, and all immediate needs met.   Therapy Documentation Precautions:  Precautions Precautions: Fall Precaution Comments: L hemi, L inattention Restrictions Weight Bearing Restrictions: No   Pain: Pain Assessment Pain Scale: Faces Faces Pain Scale: Hurts worst Pain Type: Acute pain Pain Location: Head Pain Descriptors / Indicators: Headache Pain Onset: On-going   ADL: See Care Tool for more details.   Therapy/Group: Individual Therapy  Volanda Napoleon MS, OTR/L  03/24/2020, 3:36 PM

## 2020-03-25 ENCOUNTER — Inpatient Hospital Stay (HOSPITAL_COMMUNITY): Payer: Medicare PPO | Admitting: Speech Pathology

## 2020-03-25 ENCOUNTER — Inpatient Hospital Stay (HOSPITAL_COMMUNITY): Payer: Medicare PPO

## 2020-03-25 ENCOUNTER — Inpatient Hospital Stay (HOSPITAL_COMMUNITY): Payer: Medicare PPO | Admitting: Occupational Therapy

## 2020-03-25 LAB — GLUCOSE, CAPILLARY
Glucose-Capillary: 95 mg/dL (ref 70–99)
Glucose-Capillary: 176 mg/dL — ABNORMAL HIGH (ref 70–99)
Glucose-Capillary: 276 mg/dL — ABNORMAL HIGH (ref 70–99)
Glucose-Capillary: 136 mg/dL — ABNORMAL HIGH (ref 70–99)

## 2020-03-25 NOTE — Progress Notes (Signed)
Occupational Therapy Note  Patient Details  Name: [name] MRN: [id number] Date of Birth: 04-04-1941  Today's Date: 03/25/2020 OT Missed Time: 49 Minutes Missed Time Reason: Patient fatigue  Patient asleep in bed upon OT arrival. OT attempted to wake patient with tactile stimulation and environmental changes. Pt able to wake, but shook his head and said no to participating in therapy, then closed his eyes again. OT provided max encouragement and multiple options for participation, but pt continued to decline. Pt left semi-reclined in bed with needs met and bed alarm on. Will follow up per plan of care.   [name] 03/25/2020, 3:25 PM

## 2020-03-25 NOTE — Progress Notes (Signed)
Speech Language Pathology Daily Session Note  Patient Details  Name: KORD MONETTE MRN: [id number] Date of Birth: [birth year]  Today's Date: 03/25/2020 SLP Individual Time: 1130-1155 SLP Individual Time Calculation (min): 25 min  Short Term Goals: Week 3: SLP Short Term Goal 1 (Week 3): STG=LTG due to remaining length of stay  Skilled Therapeutic Interventions: Pt was seen for skilled ST targeting cognitive-linguistic goals. Pt has become more difficult to redirect to functional tasks over last 2-3 ST sessions with this therapist. Today he required Max A multimodal cues to sustain his attention to functional targeted tasks and topics of conversation due to perseveration on getting out of bed, verbal agitation, and general disinterest in participating in therapy. Pt required Mod A verbal and visual cueing for problem solving a basic 3-step action card sequencing task when he was able to briefly attend to activity. He also required Moderate cueing for use of increased vocal intensity to increase his speech intelligibility to ~75-80% at the phrase level during session. Pt was left laying in bed with alarm set and needs within reach, and NT made aware of pt's current behavior. Continue per current plan of care.          Pain Pain Assessment Pain Scale: Faces Faces Pain Scale: No hurt Pain Location: Head Pain Radiating Towards: neck Pain Descriptors / Indicators: Headache Pain Intervention(s): Medication (See eMAR)  Therapy/Group: Individual Therapy  [name] 03/25/2020, 7:26 AM

## 2020-03-25 NOTE — Progress Notes (Signed)
Physical Therapy Session Note  Patient Details  Name: [name] MRN: [id number] Date of Birth: 01/31/1941  Today's Date: 03/25/2020 PT Individual Time: 1330-1430 PT Individual Time Calculation (min): 60 min   Short Term Goals: Week 1:  PT Short Term Goal 1 (Week 1): Patient will be able to achieve supine > sit with CGA consistently PT Short Term Goal 1 - Progress (Week 1): Progressing toward goal PT Short Term Goal 2 (Week 1): Patient will transfer bed<> wc with MinA x1 at least 50% of the time PT Short Term Goal 2 - Progress (Week 1): Progressing toward goal PT Short Term Goal 3 (Week 1): Patient will ambulate >12f with MinA x2 with LRAD PT Short Term Goal 3 - Progress (Week 1): Progressing toward goal PT Short Term Goal 4 (Week 1): Patient will complete sit <> stand with no more than ModA x1 and LRAD PT Short Term Goal 4 - Progress (Week 1): Met Week 2:  PT Short Term Goal 1 (Week 2): Pt will perform supine<>sit with min assist PT Short Term Goal 1 - Progress (Week 2): Progressing toward goal PT Short Term Goal 2 (Week 2): Pt will perform sit<>stands using LRAD with min assist PT Short Term Goal 2 - Progress (Week 2): Progressing toward goal PT Short Term Goal 3 (Week 2): Pt will perform bed<>chair transfers using LRAD with min assist PT Short Term Goal 3 - Progress (Week 2): Progressing toward goal PT Short Term Goal 4 (Week 2): Pt will ambulate at least 58fusing LRAD with min assist of 1 PT Short Term Goal 4 - Progress (Week 2): Progressing toward goal  Skilled Therapeutic Interventions/Progress Updates:    PAIN Pt initially agitated and confused shouting "Get me out of here!" Supine to sit w/min assist. stand pivot transfer bed to wc w/mod assist to R/attempts to sit before turning completey, very poor safety awareness, L inattention.  Pt transported to hall for gait training.  Pt wrestless and impatient repeated "come on, lets go!" Gait 1503f/RW, mod assist, leans L  which increased w/fatigue. Turn/sit to chair in hallway w/mod assist/cues for safety/sequencing. Pt then shouts "come on, I have to urinate, push me there".  Explained pt was not in wc, but in armchair.  Pt yells "get my chair then, now!".   Sit to stand w/min assist, gait 120f46fmod assist, gait pattern deteriorates w/fatigue, turn sit to commode w/mod assist, max cues for safety, max assist for clothing managmeent.  Pt tears copious amounts of toilet paper and squats w/min assist to clean perineum, max assist to raise pants/brief.  Gait 15ft80fbed, stand pivot transfer w/RW to bed w/mod assist.    Sit to supine w/min assist.  Pt rested in bed w/alarm set while therapist retrieved wc from hallway. Upon reentry, pt yelling "get me out of here". Supine to sit w/min assist.  stand pivot transfer to wc w/mod assist.  Once in wc pt shouting "lets go, get me out of here". Pt Sit to stand from wc and gait 10ft 9fommode w/RW.  Repeated commode transfer as above.  Pt then proceeded w/BM, handed off to NT due to session timing out.  Therapy Documentation Precautions:  Precautions Precautions: Fall Precaution Comments: L hemi, L inattention Restrictions Weight Bearing Restrictions: No    Therapy/Group: Individual Therapy  [place]ie Fielding [place]/2021, 5:11 PM

## 2020-03-25 NOTE — Progress Notes (Signed)
Occupational Therapy Session Note  Patient Details  Name: [name] MRN: [id number] Date of Birth: Dec 12, 1940  Today's Date: 03/25/2020 OT Individual Time: 1030-1100 OT Individual Time Calculation (min): 30 min    Short Term Goals: Week 3:  OT Short Term Goal 1 (Week 3): Continue working on established LTGs downgraded to mod assist overall.  Skilled Therapeutic Interventions/Progress Updates:   Pt received semi-reclined in bed, requesting to drink his coffee. Session focus on toileting, functional mobility, and LUE NMR. Pt completes side rolling L to R with min A to bend BLE, sup<>sit EOB with mod A to lift trunk and move LLE off bed, and stand-pivot to the w/c from EOB with mod A to weight shift onto LLE and L foot placement. Completed toilet transfer from w/c via stand pivot transfer with mod A. Pt req max A for clothing management primarily on L side. Did not void/have BM this session and did not exhibit perseverating behaviors to retrieve toilet paper. Seated in TIS, pt self-fed coffee using RUE, with min VCs to take small sips per SLP dysphagia plan. Pt completed ~8x of L shoulder flex/ext + L elbow ext/flex with max A from therapist to maintain LUE against gravity and to facilitate movement. Pt cont to frequently demand to return to bed and that he has a headache during sessions. Squat pivot to bed from TIS with mod A + max A to boost up in bed. Pt left semi-reclined in bed with call bell in reach and bed alarm engaged.   Therapy Documentation Precautions:  Precautions Precautions: Fall Precaution Comments: L hemi, L inattention Restrictions Weight Bearing Restrictions: No  Pain: Pain Assessment Pain Scale: Faces Faces Pain Scale: Hurts a little more  ADL: See Care Tool for more details.  Therapy/Group: Individual Therapy  Crissie Reese MS, OTR/L  03/25/2020, 12:30 PM

## 2020-03-25 NOTE — Progress Notes (Signed)
Physical Therapy Session Note  Patient Details  Name: [name] MRN: [id number] Date of Birth: [birth year]  Today's Date: 03/25/2020 PT Individual Time: 0908-0953 PT Individual Time Calculation (min): 45 min   and  Today's Date: 03/25/2020 PT Missed Time: 15 Minutes Missed Time Reason: Patient unwilling to participate  Short Term Goals: Week 3:  PT Short Term Goal 1 (Week 3): STG= LTG based on ELOS  Skilled Therapeutic Interventions/Progress Updates:    Pt supine in bed and upon therapist arrival, pt stating "get over here and help me, I need to use the bathroom." Pt continues to appear confused with increased agitation/restlessness. Supine>sitting L EOB, HOB flat, with min assist for trunk upright as pt demos increased initiation of this task today. Attempted to encourage pt to ambulate to/from bathroom but he declined stating "bring that chair over here." R stand pivot EOB>TIS w/c, no AD, with mod assist for balance and pivoting hips due to strong L lean - continues to require manual facilitation/assist to reposition L LE prior to initiating transfers due to excessive hip external rotation. Transported in/out bathroom in TIS w/c. R stand pivot TIS w/c>BSC over toilet using grab bar with min/mod assist for balance - total assist LB clothing management and peri-care, continent of bowels. Therapist assisted pt with donning clean clothes max assist as pt frequently stating "come on now, hurry up" with very poor patience/tolerance for cuing to engage in tasks. L stand pivot BSC over toilet>w/c with mod assist for balance. With gentle redirection and encouragement pt agreeable to continue therapy session.  Transported to/from gym in w/c for time management and energy conservation. Sit>stands w/c>RW with mod assist for lifting and balance - facilitation provided at L hip/knee for increased extension and WBing to improve midline posture in standing - max/total assist for L hand placement on/off RW  orthotic. Gait training ~138ft using RW with min progressed to mod assist of 1 for balance due to L lean and +2 w/c follow - demos lack of adequate R weight shift during stance resulting in poor L LE foot clearance and swing phase advancement, cuing for improvement throughout. Encouraged pt to engage in gait training task with goal of ambulating to nurses station to find the newspaper. After, pt states "come on now, take me back so I can get in the bed" requiring gentle, max cuing/redirection and encouragement with pt agreeable to continue session via playing horseshoes targeting standing tolerance/balance. Standing with min assist for balance completed horseshoe tossing task reaching R laterally to promote R weight shift and decreased L lateral lean - x3 rounds. Pt no longer able to be redirected and becoming increasingly more agitated and requesting to return to his room. Transported back in w/c. L stand pivot w/c>EOB with heavier mod assist for lifting/pivoting hips. Sit>supine with min assist. Pt left supported in the chair position of the bed to promote increased upright posture with needs in reach, bed alarm on, and RN present. Missed 15 minutes of skilled physical therapy.  Therapy Documentation Precautions:  Precautions Precautions: Fall Precaution Comments: L hemi, L inattention Restrictions Weight Bearing Restrictions: No  Pain: Reports neck pain during session - RN notified and present at end of session for medication administration.  Therapy/Group: Individual Therapy  Ginny Forth , PT, DPT, CSRS  03/25/2020, 7:59 AM

## 2020-03-25 NOTE — Progress Notes (Signed)
Patient ID: [name], male   DOB: 08-09-1940, 79 y.o.   MRN: [id number]   SW checked status of patient [name], currently still under review of clinicals. [name] #[id number] Reference #[id number]  Lavera Gu[place], [place] [phone]

## 2020-03-25 NOTE — Progress Notes (Signed)
[name] PHYSICAL MEDICINE & REHABILITATION PROGRESS NOTE   Subjective/Complaints:  No issues overnite, asking about leaving, no agitation, calm and relaxed, appropriate   ROS: unable to assess due to cognition/behavior.  Objective:   No results found. Recent Labs    03/24/20 0805  WBC 5.2  HGB 10.3*  HCT 31.9*  PLT 257   Recent Labs    03/24/20 0805  NA 139  K 3.9  CL 103  CO2 25  GLUCOSE 129*  BUN 20  CREATININE 1.20  CALCIUM 9.1    Intake/Output Summary (Last 24 hours) at 03/25/2020 0806 Last data filed at 03/25/2020 0749 Gross per 24 hour  Intake 420 ml  Output 225 ml  Net 195 ml        Physical Exam: Vital Signs Blood pressure 129/65, pulse (!) 52, temperature 98.2 F (36.8 C), temperature source Oral, resp. rate 18, height 5\' 11"  (1.803 m), weight 65 kg, SpO2 99 %.    General: No acute distress Mood and affect are appropriate Heart: Regular rate and rhythm no rubs murmurs or extra sounds Lungs: Clear to auscultation, breathing unlabored, no rales or wheezes Abdomen: Positive bowel sounds, soft nontender to palpation, nondistended Extremities: No clubbing, cyanosis, or edema Skin: No evidence of breakdown, no evidence of rash, midline site looks ok  Dysphonia, appears unchanged Motor:?  Left upper extremity shoulder abduction 2/5, distally 4 -/5, ?stable  Left lower extremity: 4 -/5 proximal to distal  Assessment/Plan: 1. Functional deficits which require 3+ hours per day of interdisciplinary therapy in a comprehensive inpatient rehab setting.  Physiatrist is providing close team supervision and 24 hour management of active medical problems listed below.  Physiatrist and rehab team continue to assess barriers to discharge/monitor patient progress toward functional and medical goals  Care Tool:  Bathing    Body parts bathed by patient: Chest, Abdomen, Left arm, Front perineal area, Face, Right upper leg, Left upper leg, Right lower leg,  Left lower leg, Buttocks   Body parts bathed by helper: Right arm Body parts n/a: Right upper leg, Left upper leg, Right lower leg, Left lower leg (did not attempt secondary to time)   Bathing assist Assist Level: Minimal Assistance - Patient > 75%     Upper Body Dressing/Undressing Upper body dressing   What is the patient wearing?: Pull over shirt    Upper body assist Assist Level: Maximal Assistance - Patient 25 - 49%    Lower Body Dressing/Undressing Lower body dressing      What is the patient wearing?: Pants     Lower body assist Assist for lower body dressing: Maximal Assistance - Patient 25 - 49%     Toileting Toileting    Toileting assist Assist for toileting: Moderate Assistance - Patient 50 - 74%     Transfers Chair/bed transfer  Transfers assist     Chair/bed transfer assist level: Moderate Assistance - Patient 50 - 74% (stand pivot) Chair/bed transfer assistive device:   Ambulation assist      Assist level: Moderate Assistance - Patient 50 - 74% Assistive device: Walker-rolling Max distance: 88ft   Walk 10 feet activity   Assist     Assist level: Moderate Assistance - Patient - 50 - 74% Assistive device: Walker-rolling   Walk 50 feet activity   Assist Walk 50 feet with 2 turns activity did not occur: Safety/medical concerns (due to patient fatigue/weakness)  Assist level: Moderate Assistance - Patient - 50 - 74% Assistive  device: Walker-rolling    Walk 150 feet activity   Assist Walk 150 feet activity did not occur: Safety/medical concerns (due to patient fatigue/weakness)         Walk 10 feet on uneven surface  activity   Assist Walk 10 feet on uneven surfaces activity did not occur: Safety/medical concerns (due to patient fatigue/weakness)         Wheelchair     Assist Will patient use wheelchair at discharge?: Yes Type of Wheelchair: Manual    Wheelchair assist level:  Dependent - Patient 0% (TIS w/c) Max wheelchair distance: 150    Wheelchair 50 feet with 2 turns activity    Assist        Assist Level: Dependent - Patient 0%   Wheelchair 150 feet activity     Assist      Assist Level: Dependent - Patient 0%    Medical Problem List and Plan: 1. 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    Continue CIR PT, OT, SLP 2.  Antithrombotics: -DVT/anticoagulation:  Pharmaceutical: Other (comment)--Eliquis.              -antiplatelet therapy: N/A--has been off ASA/Brilinta.  3. Chronic HA/Cervicalgia/Pain Management: Was on flexeril prn PTA. Will monitor N/A.   K pad and cervical spine XR given new neck pain and TTP on cervical spine. 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             Monitor with increased exertion  Muscle rub ordered on 11/26   Appears controlled with meds on 11/30 4. Mood: LCSW to follow for evaluation and support.              -antipsychotic agents: Monitor for now.  5. Neuropsych: This patient is not capable of making decisions on his own behalf. 6. Skin/Wound Care: Routine pressure relief measures. Protein supplement due to evidence of malnutrition.  7. Fluids/Electrolytes/Nutrition: Monitor I/Os.   8. Gastroenteritis: Had multiple episodes of N/V on 11/09 with 4 incontinent stools and low grade fever. Monitor for GI virus. 9. HTN: Monitor BP educate on importance of compliance. SBP goal 130-150   Vitals:   03/24/20 1529 03/25/20 0501  BP: 107/69 129/65  Pulse: 65 (!) 52  Resp: 17 18  Temp: 98.7 F (37.1 C) 98.2 F (36.8 C)  SpO2: 99% 99%    controlled on 11/30 10. ICM/Heart failure with persistent apical thrombus: Monitor for signs and symptoms of  fluid overload. Daily weights. Continue Eliquis 11. A fib: Monitor HR -continue Lanoxin and Eliquis.              Monitor with increased activity 12. Acute on chronic renal failure: Likely due to gastroenteritis.   Creatinine 1.22 on 11/24, labs ordered for tomorrow 13. Electrolytes abnormalities: Resolved post supplementation 14. Anemia: Recheck iron levels.                    Hemoglobin 9.7 on 11/22, labs ordered for tomorrow 15. T2DM with hyperglycemia: Hgb A1C-6.8.  probable neuropathy trial gabapentin low dose   Will monitor BS ac/hs.Add CM restrictions to diet.              Monitor with increased mobility CBG (last 3)  Recent Labs    03/24/20 1620 03/24/20 2103 03/25/20 0553  GLUCAP 126*  147* 95   Well controlled 11/30 16. 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 MSSA bacteremia  TEE showed no valve vegetations, no other sig structural abnl in report cardiology to f/u with recs  IV cefazolin q 8h, to D/C on 04/05/20.   19.  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

## 2020-03-25 NOTE — Progress Notes (Signed)
Patient ID: [name], male   DOB: 06/12/1940, 79 y.o.   MRN: [id number]  SW followed up with daughter to see if family would available for family education before discharge. Daughter prefers that she attends session. Her attending depends on patient insurance approval. If patient is approved today for discharge tomorrow, daughter will begin traveling to [facility] tonight and attend session in AM before discharge. If patient is not approved until tomorrow, daughter will begin her traveling tomorrow night and arrive to [facility] Thursday AM for education before d/c. SW will tentatively put daughter down for education tomorrow (03/26/2020, 9-10 AM). Will follow up if adjustments are needed.   Daughter will bring clothing for patient to travel in. Daughter prefers patient use urinal in car. Will follow up at education.  [place], [place] [phone]

## 2020-03-25 NOTE — Progress Notes (Signed)
Patient ID: [name], male   DOB: [birth year], 79 y.o.   MRN: [id number]   SW called SNF to inform facility of pending authorization. Left VM for AD. Anticipating authorization between today and tomorrow. With a d/c tomorrow or Thursday.   [place], [place] [phone]

## 2020-03-25 NOTE — Progress Notes (Signed)
Nutrition Follow-up  DOCUMENTATION CODES:   Severe malnutrition in context of chronic illness  INTERVENTION:   -Continue Ensure Enlive po TID, each supplement provides 350 kcal and 20 grams of protein  - Continue ProSource Plus 30 ml po BID, each supplement provides 100 kcal and 15 grams of protein  - Continue MagicCup BID with meals, each supplement provides 290 kcal and 9 grams of protein  - Continue Juven BID to aid in wound healing  NUTRITION DIAGNOSIS:   Severe Malnutrition related to chronic illness (COPD, CVA) as evidenced by severe muscle depletion, severe fat depletion.  Ongoing  GOAL:   Patient will meet greater than or equal to 90% of their needs  Progressing  MONITOR:   PO intake, Supplement acceptance, Diet advancement, Labs, Weight trends, Skin  REASON FOR ASSESSMENT:   Consult Diet education  ASSESSMENT:   79 year old male with PMH of T2DM, HTN, CAD s/p CABG in 2020, ICM, cardioembolic CVA, COPD who was admitted on 02/28/20 after being found on the floor with left hemiparesis, facial droop, and multiple abrasions. 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 Hospital course further complicated by post-stroke dysphagia. Pt started on a dysphagia 1 diet with nectar-thick liquids. Admitted to CIR on 11/10.  11/23 - MBS, diet advanced to dysphagia 1 with thin liquids  Spoke with pt at bedside. Pt reports that he has a good appetite and is eating well. Pt reports that he is drinking supplements. Noted Juven and Ensure at bedside. Pt states that he is drinking them slowly.  CIR admit weight: 65.4 kg Current weight: 65 kg  Meal Completion: 70-100%  Medications reviewed and include: ProSource Plus BID, Ensure Enlive TID, SSI, metformin, Juven,  vitamin C, IV abx  Labs reviewed. CBG's: 95-176 x 24 hours  Diet Order:   Diet Order            DIET - DYS 1 Room service appropriate? Yes; Fluid consistency: Thin  Diet effective now                 EDUCATION NEEDS:   Not appropriate for education at this time  Skin:  Skin Assessment: Skin Integrity Issues: Incisions: face Other: non-pressure wounds to bilateral knees, non-pressure wound to left shoulder  Last BM:  03/25/20  Height:   Ht Readings from Last 1 Encounters:  03/10/20 5\' 11"  (1.803 m)    Weight:   Wt Readings from Last 1 Encounters:  03/25/20 65 kg    BMI:  Body mass index is 20 kg/m.  Estimated Nutritional Needs:   Kcal:  1800-2000  Protein:  85-100 grams  Fluid:  1.8-2.0 L    03/27/20, MS, RD, LDN Inpatient Clinical Dietitian Please see [facility] for contact information.

## 2020-03-25 NOTE — Progress Notes (Signed)
Patient ID: [name], male   DOB: Dec 03, 1940, 79 y.o.   MRN: [id number]   Peer to peer information provided to attending physician.  [phone] (option 5) auth #[id number]  Lavera Guise, BSW [phone]

## 2020-03-26 ENCOUNTER — Inpatient Hospital Stay (HOSPITAL_COMMUNITY): Payer: Medicare PPO | Admitting: Occupational Therapy

## 2020-03-26 ENCOUNTER — Inpatient Hospital Stay (HOSPITAL_COMMUNITY): Payer: Medicare PPO

## 2020-03-26 ENCOUNTER — Inpatient Hospital Stay (HOSPITAL_COMMUNITY): Payer: Medicare PPO | Admitting: Speech Pathology

## 2020-03-26 ENCOUNTER — Ambulatory Visit (HOSPITAL_COMMUNITY): Payer: Medicare PPO

## 2020-03-26 LAB — GLUCOSE, CAPILLARY
Glucose-Capillary: 132 mg/dL — ABNORMAL HIGH (ref 70–99)
Glucose-Capillary: 150 mg/dL — ABNORMAL HIGH (ref 70–99)
Glucose-Capillary: 83 mg/dL (ref 70–99)
Glucose-Capillary: 150 mg/dL — ABNORMAL HIGH (ref 70–99)

## 2020-03-26 MED ORDER — CITALOPRAM HYDROBROMIDE 10 MG PO TABS
10.0000 mg | ORAL_TABLET | Freq: Every day | ORAL | Status: DC
  Administered 2020-03-26 – 2020-04-14 (×20): 10 mg via ORAL
  Filled 2020-03-26 (×20): qty 1

## 2020-03-26 NOTE — Progress Notes (Signed)
Physical Therapy Session Note  Patient Details  Name: [name] MRN: [id number] Date of Birth: 02-02-1941  Today's Date: 03/26/2020 PT Individual Time: 0915-0935 PT Individual Time Calculation (min): 20 min  and Today's Date: 03/26/2020 PT Missed Time: 55 Minutes Missed Time Reason: Increased agitation;Patient unwilling to participate  Short Term Goals: Week 3:  PT Short Term Goal 1 (Week 3): STG= LTG based on ELOS  Skilled Therapeutic Interventions/Progress Updates:    Patient received supine in bed, asleep, difficult to rouse. Dtr not present for family ed at this time. He was initially agreeable to dressing to get ready for dtr. MaxA to don pants supine in bed. He was able to roll with supervision and use of bed rails. Verbal cues to initiate using L UE/LE. Once patient had pants on, patient began saying "I need you to leave me alone- leave me alone!" PT attempting to redirect patient to getting ready for dtr's arrival. Patient began to use expletives toward PT requesting to be left alone. Patient remaining in bed, bed alarm on, call light within reach.   PT making multiple attempts to return to patients room for patient to participate in therapy. Dtr not present throughout remainder of therapy scheduled time, patient continuing to refuse to participate.  Therapy Documentation Precautions:  Precautions Precautions: Fall Precaution Comments: L hemi, L inattention Restrictions Weight Bearing Restrictions: No    Therapy/Group: Individual Therapy  Elizebeth Koller, PT, DPT, CBIS  03/26/2020, 8:55 AM

## 2020-03-26 NOTE — Progress Notes (Addendum)
[place] PHYSICAL MEDICINE & REHABILITATION PROGRESS NOTE   Subjective/Complaints:  Pt ate 100% breakfast, alert today   ROS: unable to assess due to cognition/behavior.  Objective:   No results found. Recent Labs    03/24/20 0805  WBC 5.2  HGB 10.3*  HCT 31.9*  PLT 257   Recent Labs    03/24/20 0805  NA 139  K 3.9  CL 103  CO2 25  GLUCOSE 129*  BUN 20  CREATININE 1.20  CALCIUM 9.1    Intake/Output Summary (Last 24 hours) at 03/26/2020 0816 Last data filed at 03/25/2020 1753 Gross per 24 hour  Intake 150 ml  Output --  Net 150 ml        Physical Exam: Vital Signs Blood pressure (!) 123/58, pulse 60, temperature 98.6 F (37 C), resp. rate 18, height _0  (1.803 m), weight 65.9 kg, SpO2 100 %.    General: No acute distress Mood and affect are appropriate Heart: Regular rate and rhythm no rubs murmurs or extra sounds Lungs: Clear to auscultation, breathing unlabored, no rales or wheezes Abdomen: Positive bowel sounds, soft nontender to palpation, nondistended Extremities: No clubbing, cyanosis, or edema Skin: No evidence of breakdown, no evidence of rash   Dysphonia, appears unchanged Motor:?  Left upper extremity shoulder abduction 3-/5, distally 4 -/5, ?stable  Left lower extremity: 4 -/5 proximal to distal  Assessment/Plan: 1. Functional deficits which require 3+ hours per day of interdisciplinary therapy in a comprehensive inpatient rehab setting.  Physiatrist is providing close team supervision and 24 hour management of active medical problems listed below.  Physiatrist and rehab team continue to assess barriers to discharge/monitor patient progress toward functional and medical goals  Care Tool:  Bathing    Body parts bathed by patient: Chest, Abdomen, Left arm, Front perineal area, Face, Right upper leg, Left upper leg, Right lower leg, Left lower leg, Buttocks   Body parts bathed by helper: Right arm Body parts n/a: Right upper leg,  Left upper leg, Right lower leg, Left lower leg (did not attempt secondary to time)   Bathing assist Assist Level: Minimal Assistance - Patient > 75%     Upper Body Dressing/Undressing Upper body dressing   What is the patient wearing?: Pull over shirt    Upper body assist Assist Level: Maximal Assistance - Patient 25 - 49%    Lower Body Dressing/Undressing Lower body dressing      What is the patient wearing?: Pants     Lower body assist Assist for lower body dressing: Maximal Assistance - Patient 25 - 49%     Toileting Toileting    Toileting assist Assist for toileting: Moderate Assistance - Patient 50 - 74%     Transfers Chair/bed transfer  Transfers assist     Chair/bed transfer assist level: Moderate Assistance - Patient 50 - 74% Chair/bed transfer assistive device: Programmer, multimedia   Ambulation assist      Assist level: Moderate Assistance - Patient 50 - 74% Assistive device: Walker-rolling Max distance: 154f   Walk 10 feet activity   Assist     Assist level: Minimal Assistance - Patient > 75% Assistive device: Walker-rolling   Walk 50 feet activity   Assist Walk 50 feet with 2 turns activity did not occur: Safety/medical concerns (due to patient fatigue/weakness)  Assist level: Moderate Assistance - Patient - 50 - 74% Assistive device: Walker-rolling    Walk 150 feet activity   Assist Walk 150 feet activity did  not occur: Safety/medical concerns (due to patient fatigue/weakness)  Assist level: Moderate Assistance - Patient - 50 - 74% Assistive device: Walker-rolling    Walk 10 feet on uneven surface  activity   Assist Walk 10 feet on uneven surfaces activity did not occur: Safety/medical concerns (due to patient fatigue/weakness)         Wheelchair     Assist Will patient use wheelchair at discharge?: Yes Type of Wheelchair: Manual    Wheelchair assist level: Dependent - Patient 0% (TIS w/c) Max  wheelchair distance: 150    Wheelchair 50 feet with 2 turns activity    Assist        Assist Level: Dependent - Patient 0%   Wheelchair 150 feet activity     Assist      Assist Level: Dependent - Patient 0%    Medical Problem List and Plan: 1. 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    Continue CIR PT, OT, SLP Team conference today please see physician documentation under team conference tab, met with team  to discuss problems,progress, and goals. Formulized individual treatment plan based on medical history, underlying problem and comorbidities. Pt with intermittent refusal of therapy, tolerates lower intensity , will write for 15/7 , would do well at SNF level to complete IV abx 2.  Antithrombotics: -DVT/anticoagulation:  Pharmaceutical: Other (comment)--Eliquis.              -antiplatelet therapy: N/A--has been off ASA/Brilinta.  3. Chronic HA/Cervicalgia/Pain Management: Was on flexeril prn PTA. Will monitor N/A.   K pad and cervical spine XR given new neck pain and TTP on cervical spine. 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             Monitor with increased exertion  Muscle rub ordered on 11/26   Appears controlled with meds on 11/30 4. Mood: LCSW to follow for evaluation and support.              -antipsychotic agents: Monitor for now.  5. Neuropsych: This patient is not capable of making decisions on his own behalf. 6. Skin/Wound Care: Routine pressure relief measures. Protein supplement due to evidence of malnutrition.  7. Fluids/Electrolytes/Nutrition: Monitor I/Os.    9. HTN: Monitor BP educate on importance of compliance. SBP goal 130-150   Vitals:   03/25/20 1427 03/25/20 2022  BP: 121/60  (!) 123/58  Pulse: (!) 56 60  Resp: 15 18  Temp: 98.3 F (36.8 C) 98.6 F (37 C)  SpO2: 100% 100%    controlled on 12/1 10. ICM/Heart failure with persistent apical thrombus: Monitor for signs and symptoms of fluid overload. Daily weights. Continue Eliquis 11. A fib: Monitor HR -continue Lanoxin and Eliquis.              Monitor with increased activity 12. Acute on chronic renal failure: Likely due to gastroenteritis.   Creatinine 1.22 on 11/24, labs ordered for tomorrow 13. Electrolytes abnormalities: Resolved post supplementation 14. Anemia: Recheck iron levels.                    Hemoglobin 9.7 on 11/22, labs ordered for tomorrow 15. T2DM with hyperglycemia: Hgb A1C-6.8.  probable neuropathy trial gabapentin low dose   Will monitor BS ac/hs.Add CM restrictions to diet.  Monitor with increased mobility CBG (last 3)  Recent Labs    03/25/20 1634 03/25/20 2050 03/26/20 0538  GLUCAP 276* 136* 132*   Well controlled12/1 16. 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 MSSA bacteremia  TEE showed no valve vegetations, no other sig structural abnl in report cardiology to f/u with recs  IV cefazolin q 8h, to D/C on 04/05/20.   19.  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

## 2020-03-26 NOTE — Progress Notes (Signed)
Occupational Therapy Session Note  Patient Details  Name: [name] MRN: [id number] Date of Birth: 08-15-1940  Today's Date: 03/26/2020 OT Individual Time: 9381-8299 OT Individual Time Calculation (min): 24 min    Short Term Goals: Week 3:  OT Short Term Goal 1 (Week 3): Continue working on established LTGs downgraded to mod assist overall.   Skilled Therapeutic Interventions/Progress Updates:    Pt greeted at time of session supine in bed resting and agreeable to additional OT session that was not scheduled. Pt stating he had mild discomfort in LUE and OT offered to perform ROM and NMR. Supine to sit EOB Mod A with encouragement to use bed rail. AAROM for LUE shoulder flexion/extension, shoulder circles, elbow flexion/extension, forearm supination/pronation with varying level of engagement requiring multimodal cues to attend. NMR weight bearing through LUE pushing away therapist hand to engage shoulder muscles and promote functional movement pattern. Attempted soft sponge squeezes for L hand, pt engaged at first but required increasing hand over hand for 2x10. Pt returned self to side lying when fatigued, assisted with midline positioning and scooting up in bed Mod A, encouraged to push through feet and bridge. LUE elevated, alarm on call bell in reach. Note pt perseverated on shoes throughout session, redirected to task.   Therapy Documentation Precautions:  Precautions Precautions: Fall Precaution Comments: L hemi, L inattention Restrictions Weight Bearing Restrictions: No     Therapy/Group: Individual Therapy  Erasmo Score 03/26/2020, 4:47 PM

## 2020-03-26 NOTE — Progress Notes (Signed)
Patient ID: HUIE GHUMAN, male   DOB: Mar 21, 1941, 79 y.o.   MRN: [id number]   SW received update from physician on peer to peer. Pt did not receive insurance authorization to SNF. Insurance would like patient to continue his stay with CIR until IV ABX are complete. Daughter still visiting pt today, has some questions for physician. Sw addressed recommendations and level of care with daughter. Daughter still plans to have patient move in with her (short-term)  at discharge until able to transition patients insurance plan to be placed into LTC. Daughter plans on scheduling and attending family education before discharge home.  Sw will follow up with facility to provide updates. Sw will follow up with therapy team for any further recommendations.  [place], [place] [phone]

## 2020-03-26 NOTE — Progress Notes (Signed)
Patient ID: [name], male   DOB: 1940-05-23, 79 y.o.   MRN: [id number]  SW attempted to contact SNF to provide updates on auth, no answer. Will continue to attempt.  [place], [place] [phone]

## 2020-03-26 NOTE — Progress Notes (Addendum)
Occupational Therapy Session Note  Patient Details  Name: [name] MRN: [id number] Date of Birth: July 22, 1940  Today's Date: 03/26/2020 OT Individual Time: 8403-7543 OT Individual Time Calculation (min): 55 min    Short Term Goals: Week 3:  OT Short Term Goal 1 (Week 3): Continue working on established LTGs downgraded to mod assist overall.  Skilled Therapeutic Interventions/Progress Updates:    Pt received side-lying in bed asleep. Required max multimodal cues to awaken. Pt found soiled in incont brief. Session focused on full body bathing, dressing, func mobility, and LUE weight bearing. Completed side roll to L with min A to bend BLE. Sup <> sit EOB with mod A to lift trunk. Sit to stand initially max assist secondary to not being successful on first attempt and then needing a second attempt to complete.  Stand-pivot transfer from EOB to TIS w/c with max A to weight shift on to LLE. Req max A to doff pants and incont brief in standing. Max A to doff paper scrub top (to pull BUE and head out). Bathed in TIS at sink, req max A for thoroughness of task and to bathe RUE using LUE. Donned brief/pants in standing with max A to pull up. Donned long sleeved shirt with max A to thread LUE and head through shirt. Transported pt to Day Room 2/2 time constraints and energy conservation. Sit <> stand at table with mod A to power up + L foot placement. Targeted BLE weight shifting and LUE weight bearing by retrieving cup on L side and placing to R side 3x. Pt able to maintain standing for total of ~4 min. Pt left in TIS w/c with lap belt alarm engaged, call bell in reach, and all immediate needs met.   Therapy Documentation Precautions:  Precautions Precautions: Fall Precaution Comments: L hemi, L inattention Restrictions Weight Bearing Restrictions: No  Pain: C/o of L knee pain upon sit<>stand. Resolved with returning to w/c.   ADL: See Care Tool for more details.   Therapy/Group: Individual  Therapy  Volanda Napoleon MS, OTR/L Clyda Greener OTR/L   03/26/2020, 7:32 AM

## 2020-03-26 NOTE — Progress Notes (Signed)
Patient ID: [name], male   DOB: 03/08/1941, 79 y.o.   MRN: [id number] Team Conference Report to Patient/Family  Team Conference discussion was reviewed with the patient and caregiver, including goals, any changes in plan of care and target discharge date.  Patient and caregiver express understanding and are in agreement.  The patient has a target discharge date of 04/05/20.  Andria Rhein 03/26/2020, 3:47 PM

## 2020-03-26 NOTE — Progress Notes (Signed)
Physical Therapy Session Note  Patient Details  Name: [name] MRN: [id number] Date of Birth: 07-Dec-1940  Today's Date: 03/26/2020 PT Individual Time: 1302-1335 PT Individual Time Calculation (min): 33 min  and Today's Date: 03/26/2020 PT Missed Time: 12 Minutes Missed Time Reason: Increased agitation;Patient unwilling to participate  Short Term Goals: Week 3:  PT Short Term Goal 1 (Week 3): STG= LTG based on ELOS  Skilled Therapeutic Interventions/Progress Updates:    Patient received supine in bed, asleep, difficult to rouse. PT providing gentle sternal rub, other tactile and auditory cues to awaken patient. He was agreeable to getting out of bed to play horseshoes. He required ModA to come to sit edge of bed. Manual facilitation and verbal cues for correct hand placement to allow patient to assist more with bed mobility, however, once PT began assisting patient, he discontinued his effort. He continues to demonstrate L lateral lean in sitting and no protective responses from L UE to prevent falling to the L. Patient transferring to wc via stand pivot with ModA. PT propelling patient in wc to therapy gym for time management and energy conservation. He was able to complete sit <> stand with HHA and ModA. Persistent L lateral lean with increased L knee flexion. When PT attempted to facilitate increased L knee extension, patient stated that his knee hurt. Horseshoes placed on R side to encourage weight shift and at least midline trunk position to retrieve horseshoes. MinA/ModA needed to maintain dynamic standing balance. Patient able to complete 3 rounds of horseshoes before requesting to go back to bed. PT needing to clean up horseshoes/therapy space and patient became increasingly frustrated/agitated requesting to return to bed immediately. Patient transferring back to bed via stand pivot with ModA. MinA to return to supine. Bed alarm on, call light within reach.   Therapy  Documentation Precautions:  Precautions Precautions: Fall Precaution Comments: L hemi, L inattention Restrictions Weight Bearing Restrictions: No    Therapy/Group: Individual Therapy  Elizebeth Koller, PT, DPT, CBIS  03/26/2020, 1:44 PM

## 2020-03-26 NOTE — Progress Notes (Signed)
Occupational Therapy Note  Patient Details  Name: [name] MRN: [id number] Date of Birth: January 18, 1941  Pt's therapy reduced to 15/7 based on inconsistent participation, decreased endurance, and slower progress than originally expected.  Pt's discharge date extended as well based on need for IV antibiotics.  Feel he will tolerate less therapy daily over a longer period of time.    Orville Mena,Garyn OTR/L 03/26/2020, 11:35 AM

## 2020-03-26 NOTE — Progress Notes (Signed)
Speech Language Pathology Daily Session Note  Patient Details  Name: [name] MRN: [id number] Date of Birth: March 23, 1941  Today's Date: 03/26/2020 SLP Individual Time: 1430-1445 SLP Individual Time Calculation (min): 15 min  Short Term Goals: Week 3: SLP Short Term Goal 1 (Week 3): STG=LTG due to remaining length of stay  Skilled Therapeutic Interventions: Pt was seen for skilled ST targeting cognitive goals. Upon arrival, pt reluctantly engaged in simple social exchange with therapist, although Mod A verbal cueing required for him to increase vocal intensity for functional intelligibility. He required Max A for use of memory notebook to recall activities from earlier therapy sessions and quickly became frustrated with therapist. He raised his voice and asked therapist to "just stop talking". SLP attempted to shift activities to better engage pt with no success and eventually he refused to participate and ordered therapist to leave. Therefore, he missed remaining 15 minutes of skilled ST this afternoon. Pt left laying in bed with alarm set and needs within reach. Continue per current plan of care.          Pain Pain Assessment Pain Scale: Faces Faces Pain Scale: No hurt  Therapy/Group: Individual Therapy  [name] 03/26/2020, 7:21 AM

## 2020-03-26 NOTE — Patient Care Conference (Signed)
Inpatient RehabilitationTeam Conference and Plan of Care Update Date: 03/26/2020   Time: 10:51 AM    Patient Name: [name]      Medical Record Number: [id number]  Date of Birth: Jul 25, 1940 Sex: Male         Room/Bed: 4W08C/4W08C-01 Payor Info: Payor: [employer] MEDICARE / Plan: [employer] MEDICARE CHOICE PPO / Product Type: *No Product type* /    Admit Date/Time:  03/05/2020  3:24 PM  Primary Diagnosis:  MSSA bacteremia  Hospital Problems: Principal Problem:   MSSA bacteremia Active Problems:   Type II diabetes mellitus (HCC)   Acute right MCA stroke (HCC)   Protein-calorie malnutrition, severe   Neck pain   Bacteremia   Dysphagia, post-stroke   Cognitive deficit, post-stroke    Expected Discharge Date: Expected Discharge Date: 04/05/20  Team Members Present: Physician leading conference: Dr. Claudette Laws Care Coodinator Present: Chana Bode, RN, BSN, CRRN;Christina Canovanillas, BSW Nurse Present: Adam Phenix, LPN PT Present: Merry Lofty, PT OT Present: Perrin Maltese, OT PPS Coordinator present : Fae Pippin, [place], PT     Current Status/Progress Goal Weekly Team Focus  Bowel/Bladder   incont of bowel and bladder lbm 11/30  decreased amounts of b and b  assess toileting needs Q2 hours and PRN   Swallow/Nutrition/ Hydration   dys 1 textures, thin liquids, some oral holding and pocketing, Min A for use of compensatory strategies  Min A  carryover swallow stategies, efficiency of intake and Dys 2 trials, education with family   ADL's   min to mod A for UB bathing and dressing, mod A for LB bathing, toileting, toilet transfers, max A for LB dressing. LUE Brunnstrum stage III in the arm and hand. Cont to demonstrate increased confusion and agitation, perseverative behaviors during ADLs.  mod A overall  functional cognition, transfer training, balance retraining, family edu, NMR, DME   Mobility   up to ModA bed mobility, ModA STS/transfers, ModA gait up to  100 ft using RW, increasing agitation/confusion  downgraded to min/mod assist  participation in therapy, activity tolerance, family/pt ed, transfer tx, sitting/standing balance, L attention   Communication   Dysarthria, Mod A strategies (mostly increased vocal intensity and breath support/coordination with speaking)  Mod A (downgraded)  increased vocal intensity, education with family   Safety/Cognition/ Behavioral Observations  Mod-Max A, has become increasingly difficult to redirect and facilitated his particiation in functional tasks  Min-Mod (downgraded)  attention, safety and intellectual awareness, recall, basic familiar problem solving, education with family   Pain   pt complains of headach 6/10 well controlled with tylenol but has episodes of 8/10 pain  decrease pain  assess q4 hrs and PRn   Skin   pt has abrasions to knees and left shoulder current treatment with bactroban helping with wound  healthy healing and prevention of infection  continue dressing changed q3 days to knees and shoulder     Discharge Planning:  Goal to discharge to SNF tomorrow or Thursday   Team Discussion: IV abx through 04/04/20. MD completed peer to peer for SNF; denial. Patient remains incontinent of bowel and bladder and  perseverates on toileting, Patient requires hand over hand assist for functional tasks due to increased confusion, agitation.  Patient on target to meet rehab goals: no, mod assist for upper body care and max assist for lower body care. Little progress noted over the past week; functional plateau level.  *See Care Plan and progress notes for long and short-term goals.   Revisions  to Treatment Plan:  Downgraded goals to mod assist for PT, OT and SLP Change to 15/7 therapy schedule  Teaching Needs: Transfers, toileting, medications, etc.  Current Barriers to Discharge: Decreased caregiver support, Incontinence and Insurance for SNF coverage  Possible Resolutions to Barriers: Family  education with daughter     Medical Summary Current Status: CBGs stabilizing, still requiring IV abx  Barriers to Discharge: Incontinence;IV antibiotics   Possible Resolutions to [employer] Focus: cont IV ancef TID, untill 04/05/20, 15/7 schedule   Continued Need for Acute Rehabilitation Level of Care: The patient requires daily medical management by a physician with specialized training in physical medicine and rehabilitation for the following reasons: Direction of a multidisciplinary physical rehabilitation program to maximize functional independence : Yes Medical management of patient stability for increased activity during participation in an intensive rehabilitation regime.: Yes Analysis of laboratory values and/or radiology reports with any subsequent need for medication adjustment and/or medical intervention. : Yes   I attest that I was present, lead the team conference, and concur with the assessment and plan of the team.   Chana Bode B 03/26/2020, 3:04 PM

## 2020-03-27 ENCOUNTER — Encounter (HOSPITAL_COMMUNITY): Payer: Medicare PPO | Admitting: Speech Pathology

## 2020-03-27 ENCOUNTER — Ambulatory Visit (HOSPITAL_COMMUNITY): Payer: Medicare PPO | Admitting: Physical Therapy

## 2020-03-27 ENCOUNTER — Encounter (HOSPITAL_COMMUNITY): Payer: Medicare PPO | Admitting: Occupational Therapy

## 2020-03-27 LAB — GLUCOSE, CAPILLARY
Glucose-Capillary: 99 mg/dL (ref 70–99)
Glucose-Capillary: 225 mg/dL — ABNORMAL HIGH (ref 70–99)
Glucose-Capillary: 75 mg/dL (ref 70–99)
Glucose-Capillary: 129 mg/dL — ABNORMAL HIGH (ref 70–99)

## 2020-03-27 NOTE — Progress Notes (Signed)
Patient ID: [name], male   DOB: June 03, 1940, 79 y.o.   MRN: [id number]  Patient set up with PCP in [place] at:   [facility] [phone] [address] [name] [address] [place], [place] [address]  First appointment scheduled on April 17, 2020 at 11:00 AM with Lowell Bouton MD. Provided information to daughter.  [place], [place] [phone]

## 2020-03-27 NOTE — Progress Notes (Signed)
[place] PHYSICAL MEDICINE & REHABILITATION PROGRESS NOTE   Subjective/Complaints:  No issues overnite, discussed D/C date and need for IV abx with pt , he is in agreement   ROS: unable to assess due to cognition/behavior.  Objective:   No results found. No results for input(s): WBC, HGB, HCT, PLT in the last 72 hours. No results for input(s): NA, K, CL, CO2, GLUCOSE, BUN, CREATININE, CALCIUM in the last 72 hours.  Intake/Output Summary (Last 24 hours) at 03/27/2020 0830 Last data filed at 03/27/2020 0827 Gross per 24 hour  Intake 510 ml  Output --  Net 510 ml        Physical Exam: Vital Signs Blood pressure (!) 145/54, pulse (!) 52, temperature 98.3 F (36.8 C), temperature source Oral, resp. rate 18, height 5\' 11"  (1.803 m), weight 65.9 kg, SpO2 100 %.    General: No acute distress Mood and affect are appropriate Heart: Regular rate and rhythm no rubs murmurs or extra sounds Lungs: Clear to auscultation, breathing unlabored, no rales or wheezes Abdomen: Positive bowel sounds, soft nontender to palpation, nondistended Extremities: No clubbing, cyanosis, or edema Skin: No evidence of breakdown, no evidence of rash   Dysphonia, appears unchanged Motor:?  Left upper extremity shoulder abduction 3-/5, distally 4 -/5, ?stable  Left lower extremity: 4 -/5 proximal to distal  Assessment/Plan: 1. Functional deficits which require 3+ hours per day of interdisciplinary therapy in a comprehensive inpatient rehab setting.  Physiatrist is providing close team supervision and 24 hour management of active medical problems listed below.  Physiatrist and rehab team continue to assess barriers to discharge/monitor patient progress toward functional and medical goals  Care Tool:  Bathing    Body parts bathed by patient: Chest, Abdomen, Front perineal area, Right upper leg, Left upper leg, Right lower leg, Left lower leg, Left arm, Face, Right arm   Body parts bathed by helper:  Buttocks Body parts n/a: Right upper leg, Left upper leg, Right lower leg, Left lower leg (did not attempt secondary to time)   Bathing assist Assist Level: Moderate Assistance - Patient 50 - 74%     Upper Body Dressing/Undressing Upper body dressing   What is the patient wearing?: Pull over shirt    Upper body assist Assist Level: Maximal Assistance - Patient 25 - 49%    Lower Body Dressing/Undressing Lower body dressing      What is the patient wearing?: Pants, Incontinence brief     Lower body assist Assist for lower body dressing: Maximal Assistance - Patient 25 - 49%     Toileting Toileting    Toileting assist Assist for toileting: Moderate Assistance - Patient 50 - 74%     Transfers Chair/bed transfer  Transfers assist     Chair/bed transfer assist level: Maximal Assistance - Patient 25 - 49% Chair/bed transfer assistive device:   Ambulation assist      Assist level: Moderate Assistance - Patient 50 - 74% Assistive device: Walker-rolling Max distance: 122ft   Walk 10 feet activity   Assist     Assist level: Minimal Assistance - Patient > 75% Assistive device: Walker-rolling   Walk 50 feet activity   Assist Walk 50 feet with 2 turns activity did not occur: Safety/medical concerns (due to patient fatigue/weakness)  Assist level: Moderate Assistance - Patient - 50 - 74% Assistive device: Walker-rolling    Walk 150 feet activity   Assist Walk 150 feet activity did not occur: Safety/medical concerns (due  to patient fatigue/weakness)  Assist level: Moderate Assistance - Patient - 50 - 74% Assistive device: Walker-rolling    Walk 10 feet on uneven surface  activity   Assist Walk 10 feet on uneven surfaces activity did not occur: Safety/medical concerns (due to patient fatigue/weakness)         Wheelchair     Assist Will patient use wheelchair at discharge?: Yes Type of Wheelchair: Manual     Wheelchair assist level: Dependent - Patient 0% (TIS w/c) Max wheelchair distance: 150    Wheelchair 50 feet with 2 turns activity    Assist        Assist Level: Dependent - Patient 0%   Wheelchair 150 feet activity     Assist      Assist Level: Dependent - Patient 0%    Medical Problem List and Plan: 1. 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    Continue CIR PT, OT, SLP  Pt with intermittent refusal of therapy, tolerates lower intensity , will write for 15/7 , would do well at SNF level to complete IV abx, insurance med director indicates funding for CIR not SNF 2.  Antithrombotics: -DVT/anticoagulation:  Pharmaceutical: Other (comment)--Eliquis.              -antiplatelet therapy: N/A--has been off ASA/Brilinta.  3. Chronic HA/Cervicalgia/Pain Management: Was on flexeril prn PTA. Will monitor N/A.   K pad and cervical spine XR given new neck pain and TTP on cervical spine. 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             Monitor with increased exertion  Muscle rub ordered on 11/26   Appears controlled with meds on 11/30 4. Mood: LCSW to follow for evaluation and support.              -antipsychotic agents: Monitor for now.  5. Neuropsych: This patient is not capable of making decisions on his own behalf. 6. Skin/Wound Care: Routine pressure relief measures. Protein supplement due to evidence of malnutrition.  7. Fluids/Electrolytes/Nutrition: Monitor I/Os.    9. HTN: Monitor BP educate on importance of compliance. SBP goal 130-150   Vitals:   03/26/20 1949 03/27/20 0518  BP: (!) 144/77 (!) 145/54  Pulse: (!) 57 (!) 52  Resp: 18   Temp: 98.3 F (36.8 C)   SpO2: 100%     controlled on  12/2 10. ICM/Heart failure with persistent apical thrombus: Monitor for signs and symptoms of fluid overload. Daily weights. Continue Eliquis 11. A fib: Monitor HR -continue Lanoxin and Eliquis.              Monitor with increased activity 12. Acute on chronic renal failure: Likely due to gastroenteritis.   Resolved  13. Electrolytes abnormalities: Resolved post supplementation 14. Anemia: Recheck iron levels.                    Hemoglobin 9.7 on 11/22, labs ordered for tomorrow 15. T2DM with hyperglycemia: Hgb A1C-6.8.  probable neuropathy trial gabapentin low dose   Will monitor BS ac/hs.Add CM restrictions to diet.              Monitor with increased mobility CBG (last 3)  Recent Labs    03/26/20 1628 03/26/20 2137 03/27/20 0612  GLUCAP 83 150* 99  Well controlled12/1 16. 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 MSSA bacteremia  TEE showed no valve vegetations, no other sig structural abnl in report cardiology to f/u with recs  IV cefazolin q 8h, to D/C on 04/05/20.   19.  Cognitive deficits from CVA +/- encephalopathy from bacteremia  Trial low dose Klonopin prn, used only intermittently    LOS: 22 days A FACE TO FACE EVALUATION WAS PERFORMED  Erick Colace 03/27/2020, 8:30 AM

## 2020-03-27 NOTE — Progress Notes (Signed)
Speech Language Pathology Daily Session Note  Patient Details  Name: [name] MRN: [id number] Date of Birth: Mar 18, 1941  Today's Date: 03/27/2020 SLP Individual Time: 1000-1055 SLP Individual Time Calculation (min): 55 min  Short Term Goals: Week 3: SLP Short Term Goal 1 (Week 3): STG=LTG due to remaining length of stay  Skilled Therapeutic Interventions: Pt was seen for skilled ST targeting dysphagia and cognitive goals. Although education with pt's daughter was scheduled and attempted today, no family was present. Pt received sitting upright in tilt in space wheelchair and agreeable to try an upgraded trials of Dys 2 (minced/ground) snack with coke. Although extra time was required for mastication, leading to prolonged oral transit, and trace left anterior loss noted, he cleared oral cavity with 1 verbal cue from SLP to check left buccal cavity. One instance of delayed coughing noted at end of intake. Recommend continue current diet and larger trial of Dys 2 prior to advancement. SLP further facilitated session with basic card tasks, primarily targeting participation and attention to task. Overall Mod A required for pt to sustain attention to tasks in 2-4 minute intervals. Max A required for recall within card tasks, but he identified higher value cards from field of 2 with 100% accuracy. Pt required Total A for recall of previous OT session activities, and Max A for any functional use of memory notebook as compensatory strategy. He was slightly disoriented to date, but able to re-orient with Min A verbal and visual cues. He was left sitting slightly reclined in tilt-in-space chair with seatbelt alarm set and call bell in lap. Continue per current plan of care.        Pain Pain Assessment Pain Scale: Faces Faces Pain Scale: No hurt  Therapy/Group: Individual Therapy  [name] 03/27/2020, 7:23 AM

## 2020-03-27 NOTE — Progress Notes (Signed)
Physical Therapy Weekly Progress Note  Patient Details  Name: [name] MRN: [id number] Date of Birth: 06-26-1940  Beginning of progress report period: March 19, 2020 End of progress report period: March 27, 2020  Today's Date: 03/27/2020 PT Individual Time: 8413-2440 PT Individual Time Calculation (min): 39 min   and  Today's Date: 03/27/2020 PT Missed Time: 21 Minutes Missed Time Reason: Patient unwilling to participate  Patient has met 0 of 1 short term goals due to no STGs being set based on ELOS at last progress note. [name] is making slow progress with therapy due to fluctuating levels of participation due to confusion and increased irritability/agitation. He is performing bed mobility with min/mod assist, sit<>stands with min/mod assist due to L lateral lean, and stand pivot transfers with min assist towards the R and mod assist towards the L whether he is using a RW or no AD. He is ambulating up to 18f using RW with mod assist due to L lean and poor LE foot clearance with +2 assist for w/c follow as pt becomes fatigued and insists on sitting quickly with inability to redirect him to ambulate to nearest seat.   Patient continues to demonstrate the following deficits muscle weakness, muscle joint tightness and muscle paralysis, decreased cardiorespiratoy endurance, impaired timing and sequencing, abnormal tone, unbalanced muscle activation, motor apraxia, decreased coordination and decreased motor planning, decreased attention to left, decreased initiation, decreased attention, decreased awareness, decreased problem solving, decreased safety awareness, decreased memory and delayed processing and decreased sitting balance, decreased standing balance, decreased postural control, hemiplegia and decreased balance strategies and therefore will continue to benefit from skilled PT intervention to increase functional independence with mobility.  Patient progressing toward long term  goals.  Plan of care revisions: changed to 15/7 due to inability to tolerate 3 hours of therapy..  PT Short Term Goals Week 3:  PT Short Term Goal 1 (Week 3): STG= LTG based on ELOS PT Short Term Goal 1 - Progress (Week 3): Progressing toward goal Week 4:  PT Short Term Goal 1 (Week 4): STGs = to LTGs based on ELOS  Skilled Therapeutic Interventions/Progress Updates:  Ambulation/gait training;Cognitive remediation/compensation;DME/adaptive equipment instruction;Discharge planning;Functional mobility training;Pain management;Psychosocial support;Splinting/orthotics;Therapeutic Activities;UE/LE Strength taining/ROM;Visual/perceptual remediation/compensation;Balance/vestibular training;Community reintegration;Disease management/prevention;Functional electrical stimulation;Neuromuscular re-education;Patient/family education;Skin care/wound management;Stair training;Therapeutic Exercise;UE/LE Coordination activities;Wheelchair propulsion/positioning   Pt received sitting tilted back in TIS w/c with his hips sliding forward towards edge of seat. Pt stating "come on, take me in there to get cleaned up." Upon questioning pt requesting to use restroom and agreeable to therapy session. Transported in/out of bathroom in TIS w/c - throughout session pt repeatedly stating "come on, hurry up" with poor awareness of therapist having to manage IV pole, w/c leg rests, tilt feature on w/c, etc. in order to set-up environment safely. R stand pivot w/c>BSC over toilet using R UE support on grab bar with heavy min assist for balance and assist with L LE stepping/foot positioning. Standing with min assist for balance performed LB clothing management with mod assist. Pt attempted to void but was unable to despite increased time - pt reports feeling constipated, ACaryl Pina RN made aware. Standing with min assist for balance required max assist to pull up clothing. L stand pivot to w/c with min/mod assist for balance and manual  facilitation for R weight shift to allow L LE stepping. Transported out of bathroom in w/c and pt stating "put me back to bed" - therapist attempted to redirect and encourage patient to continue  participating in therapy session; however, pt repeatedly stating that phrase becoming louder each time. L stand pivot w/c>EOB using bedrail support with mod assist for balance. Sit>supine with min assist. Pt left supine in bed with needs in reach, L UE therapeutically positioned on pillows, and bed alarm on. Discussed with Ivan Pina, RN regarding nursing staff charting pt's sleep hours at night to ensure he is not flipping his sleep/wake cycle as well as ordering him a fidget blanket to have during the day as pt noted to be shredding the newspaper and unlacing his shoes due to restlessness.  Missed 21 minutes of skilled physical therapy.  Spoke with [name] about discharge plan and recommended therapist discuss pt's CLOF with pt's Rivas, [name]. Therapist called [name], and educated her on pt's 80 of min/mod assist transfers with need for hospital bed, wheelchair, and RW if pt is to go home. Pt's Rivas confirms she has 3STE house with B HRs and has a 2 story home with 1/2 bath on main level. Pt's Rivas reports that after hearing pt's CLOF and need for 24hr assistance she is stating her plan is to set pt up with long term placement - she will discuss this further with [name].  Therapy Documentation Precautions:  Precautions Precautions: Fall Precaution Comments: L hemi, L inattention Restrictions Weight Bearing Restrictions: No  Pain:   Continues to report some cervical neck pain - unable to have pt elaborate further due to poor tolerance of therapist's follow-up questions regarding his pain.   Therapy/Group: Individual Therapy  Tawana Scale , PT, DPT, CSRS  03/27/2020, 7:59 AM

## 2020-03-27 NOTE — Progress Notes (Addendum)
Patient ID: [name], male   DOB: 03-12-1941, 79 y.o.   MRN: [id number]   Provided daughter will appeals information to [place] [phone], ref # [id number]. Still planning for d/c  Lavera Guise, [place] [phone]

## 2020-03-27 NOTE — Progress Notes (Signed)
Occupational Therapy Session Note  Patient Details  Name: CLEOPHUS MENDONSA MRN: [id number] Date of Birth: Jul 22, 1940  Today's Date: 03/27/2020 OT Individual Time: 1191-4782 OT Individual Time Calculation (min): 55 min    Short Term Goals: Week 3:  OT Short Term Goal 1 (Week 3): Continue working on established LTGs downgraded to mod assist overall.  Skilled Therapeutic Interventions/Progress Updates:    Pt completed supine to sit EOB with mod assist and mod demonstrational cueing for initiation of task.  He then completed mod assist stand pivot transfer to the wheelchair in order to move over to the sink for wash peri area secondary to incontinence episode.  He was able to complete sit to stand at the sink with min assist, requiring mod assist for standing balance while completing the washing of the front and back peri area.  He needed mod assist for donning brief and pants sit to stand with mod demonstrational cueing for starting with the LLE first.  He also needed mod assist with mod demonstrational cueing for donning pullover shirt secondary to decreased sequencing as well as decreased orientation of clothing.  Throughout session when he was given questioning cueing to determine what he needed to do next, he was able to state put on his shirt, but then was unable to state put on pants without max prompting from therapist.  Once pants were donned, therapist provided total assist for TEDs and then he was able to donn his shoes.  Therapist assisted with tying them secondary to pt not being able to use the LUE functionally for task.  Next, had him complete toilet transfer with mod assist and use of the RW for support.  Mod demonstrational cueing was needed for aligning himself up with the toilet and then reaching back to sit.  Max assist was needed for positioning and removal of the LUE off of the walker splint.  Mod assist was needed for clothing management and toilet hygiene with pt not being successful at  [facility] attempt.  Finished session with repeated practice with transfer on and off of the toilet with and without the walker.  Pt is able to complete squat/stand pivot to the right using the grab bar for support with light mod assist, however with squat pivot to the left, he needed mod assist.  He continues to demonstrate some pushing to the left with stand pivots without an assistive device which result in him needing mod to max at times.  Finished session with pt sitting up in the tilt in space wheelchair with the call button and phone in reach and safety belt in place.    Therapy Documentation Precautions:  Precautions Precautions: Fall Precaution Comments: L hemi, L inattention Restrictions Weight Bearing Restrictions: No  Pain: Pain Assessment Pain Scale: Faces Pain Score: 4  Faces Pain Scale: No hurt Pain Type: Acute pain Pain Location: Neck Pain Orientation: Posterior Pain Descriptors / Indicators: Discomfort Pain Onset: With Activity Pain Intervention(s): Repositioned;Emotional support ADL: See Care Tool Section for some details of mobility and selfcare     Therapy/Group: Individual Therapy  Whitney Bingaman,Cord OTR/L  03/27/2020, 12:34 PM

## 2020-03-28 ENCOUNTER — Inpatient Hospital Stay (HOSPITAL_COMMUNITY): Payer: Medicare PPO | Admitting: Speech Pathology

## 2020-03-28 ENCOUNTER — Inpatient Hospital Stay (HOSPITAL_COMMUNITY): Payer: Medicare PPO | Admitting: Physical Therapy

## 2020-03-28 ENCOUNTER — Inpatient Hospital Stay (HOSPITAL_COMMUNITY): Payer: Medicare PPO | Admitting: Occupational Therapy

## 2020-03-28 LAB — GLUCOSE, CAPILLARY
Glucose-Capillary: 103 mg/dL — ABNORMAL HIGH (ref 70–99)
Glucose-Capillary: 194 mg/dL — ABNORMAL HIGH (ref 70–99)
Glucose-Capillary: 126 mg/dL — ABNORMAL HIGH (ref 70–99)
Glucose-Capillary: 209 mg/dL — ABNORMAL HIGH (ref 70–99)

## 2020-03-28 NOTE — Progress Notes (Signed)
Speech Language Pathology Daily Session Note  Patient Details  Name: [name] MRN: [id number] Date of Birth: 08-10-1940  Today's Date: 03/28/2020 SLP Individual Time: 1401-1444 SLP Individual Time Calculation (min): 43 min  Short Term Goals: Week 3: SLP Short Term Goal 1 (Week 3): STG=LTG due to remaining length of stay  Skilled Therapeutic Interventions: Pt was seen for skilled ST targeting dysphagia and cognitive goals. Pt declined opportunity for Dys 2 trial, but requested coffee. He required overall Supervision A verbal cues for use of compensatory safe swallowing strategies during consumption of thin coffee. No overt s/sx aspiration observed. Pt required Mod A verbal and visual cues for basic problem solving to answer questions about a weekly weather forecast. Overall Max A verbal cues required for recall regarding safety precautions and reason for safety seatbelt to remain in tact throughout session. Moderate cues required for redirection to tasks and topics of conversation throughout session, due to pt's internal distractions. Pt left sitting in tilt in space chair with seatbelt alarm sill in place and on, call bell in lap, NT aware of pt being left upright in chair after therapy. Continue per current plan of care.          Pain Pain Assessment Pain Scale: Faces Faces Pain Scale: No hurt  Therapy/Group: Individual Therapy  [name] 03/28/2020, 7:26 AM

## 2020-03-28 NOTE — Progress Notes (Signed)
Physical Therapy Session Note  Patient Details  Name: [name] MRN: [id number] Date of Birth: 1941/02/01  Today's Date: 03/28/2020 PT Individual Time: 1007-1045 PT Individual Time Calculation (min): 38 min   Short Term Goals: Week 4:  PT Short Term Goal 1 (Week 4): STGs = to LTGs based on ELOS  Skilled Therapeutic Interventions/Progress Updates:    Pt received supine in bed, appearing tired with frequent yawning but with minimal encouragement agreeable to therapy session. Pt appears to be less confused and less agitated today talking about the events that brought him into the hospital, saying that he was found on the ground out in the yard at his house. Pt isn't able to recall that he had a CVA but is now showing some emerging awareness of his deficits saying that he cannot move around independently but shows lack of awareness on how to improve this stating he wishes someone could just give him a shot to make him better and later in session asks to return to bed requiring education on importance of mobility to increase his independence. Pt appears sad about his current situation stating "I'd rather be dead then be like this." Therapist provided emotional support and reoriented pt to the impairments from his CVA and encourages increasing therapy participation to improve his mobility level - appears pt has impaired comprehension of this education with lack of recall later in session.   Supine>sitting L EOB, HOB partially elevated, with min/mod assist for B LE management and trunk upright. Sitting EOB with intermittent min/mod assist for repeated L lateral trunk lean while therapist provided max assist for LB clothing management. Sit>stand EOB>RW with mod assist for lifting and balance due to L lean - continued assist for L LE positioning prior to standing. Encouraged pt to ambulate with +2 assist available for w/c follow but pt starts performing R stand pivot transfer to w/c requesting to sit down -  mod assist for balance and cuing for stepping L LE back towards chair prior to sitting, total assist for bringing L hand off RW orthotic due to pt quickly wanting to sit. With mod encouragement pt agreeable to continue with therapy session.  Transported to/from gym in w/c for time management and energy conservation. Gait training ~123ft using RW with min progressing to mod assist for balance due to progressively stronger L lateral lean, +2 w/c follow - continues to lack R weight shift during stance resulting in poor L LE foot clearance and decreased step length, cuing for improvement and when attending to cue pt able to clear foot and take reciprocal steps. Transported back to room and with encouragement pt agreeable to remain sitting up in w/c - left tilted back in TIS w/c with needs in reach, L UE therapeutically positioned on pillows, seat belt alarm on, and needs in reach.  Noticed pt did not have fidget blanket in room - asked secretary to order one to allow pt a task to perform during the day.   Therapy Documentation Precautions:  Precautions Precautions: Fall Precaution Comments: L hemi, L inattention Restrictions Weight Bearing Restrictions: No  Pain:   No reports of pain throughout session.   Therapy/Group: Individual Therapy  Ginny Forth , PT, DPT, CSRS  03/28/2020, 7:52 AM

## 2020-03-28 NOTE — Progress Notes (Signed)
Patient ID: [name], male   DOB: 12-20-1940, 79 y.o.   MRN: [id number]   Diagnosis codes: R78.81, B95.61, E11.9, I63.511, E43, M54.2, R78,81, [id number], [id number]  Height: 5'11                Weight: 130lbs            Patient suffers from MSSA bacteremia   which impairs their ability to perform daily activities like bathing, dressing, move independently, toilet, etc in the home.  A rolling walker will not resolve issue with performing activities of daily living.  A wheelchair will allow patient to safely perform daily activities.  Patient is not able to propel themselves in the home using a standard weight wheelchair due to limited mobility, muscle weakness, poor balance and high fall risk .  Patient can self propel in the lightweight wheelchair.

## 2020-03-28 NOTE — Progress Notes (Signed)
[place] PHYSICAL MEDICINE & REHABILITATION PROGRESS NOTE   Subjective/Complaints:  Pt is asking about d/c (same conversation as yeasterday)  Reiterated need to complete IV abx prior to D/C  ROS: unable to assess due to cognition/behavior.  Objective:   No results found. No results for input(s): WBC, HGB, HCT, PLT in the last 72 hours. No results for input(s): NA, K, CL, CO2, GLUCOSE, BUN, CREATININE, CALCIUM in the last 72 hours.  Intake/Output Summary (Last 24 hours) at 03/28/2020 0828 Last data filed at 03/27/2020 1801 Gross per 24 hour  Intake 100 ml  Output --  Net 100 ml        Physical Exam: Vital Signs Blood pressure (!) 120/51, pulse (!) 50, temperature 98.9 F (37.2 C), resp. rate 18, height 5\' 11"  (1.803 m), weight 65.9 kg, SpO2 98 %.   General: No acute distress Mood and affect are appropriate Heart: Regular rate and rhythm no rubs murmurs or extra sounds Lungs: Clear to auscultation, breathing unlabored, no rales or wheezes Abdomen: Positive bowel sounds, soft nontender to palpation, nondistended Extremities: No clubbing, cyanosis, or edema Skin: No evidence of breakdown, no evidence of rash  Dysphonia, appears unchanged Motor:?  Left upper extremity shoulder abduction 3-/5, distally 4 -/5, ?stable  Left lower extremity: 4 -/5 proximal to distal  Assessment/Plan: 1. Functional deficits which require 3+ hours per day of interdisciplinary therapy in a comprehensive inpatient rehab setting.  Physiatrist is providing close team supervision and 24 hour management of active medical problems listed below.  Physiatrist and rehab team continue to assess barriers to discharge/monitor patient progress toward functional and medical goals  Care Tool:  Bathing    Body parts bathed by patient: Face, Front perineal area, Buttocks   Body parts bathed by helper: Buttocks Body parts n/a: Right arm, Left arm, Chest, Abdomen, Right upper leg, Left upper leg, Right  lower leg, Left lower leg (did not attempt this session)   Bathing assist Assist Level: Moderate Assistance - Patient 50 - 74%     Upper Body Dressing/Undressing Upper body dressing   What is the patient wearing?: Pull over shirt    Upper body assist Assist Level: Moderate Assistance - Patient 50 - 74%    Lower Body Dressing/Undressing Lower body dressing      What is the patient wearing?: Pants, Incontinence brief     Lower body assist Assist for lower body dressing: Moderate Assistance - Patient 50 - 74%     Toileting Toileting    Toileting assist Assist for toileting: Maximal Assistance - Patient 25 - 49%     Transfers Chair/bed transfer  Transfers assist     Chair/bed transfer assist level: Moderate Assistance - Patient 50 - 74% Chair/bed transfer assistive device:   Ambulation assist      Assist level: Moderate Assistance - Patient 50 - 74% Assistive device: Walker-rolling Max distance: 123ft   Walk 10 feet activity   Assist     Assist level: Minimal Assistance - Patient > 75% Assistive device: Walker-rolling   Walk 50 feet activity   Assist Walk 50 feet with 2 turns activity did not occur: Safety/medical concerns (due to patient fatigue/weakness)  Assist level: Moderate Assistance - Patient - 50 - 74% Assistive device: Walker-rolling    Walk 150 feet activity   Assist Walk 150 feet activity did not occur: Safety/medical concerns (due to patient fatigue/weakness)  Assist level: Moderate Assistance - Patient - 50 - 74% Assistive device: Walker-rolling  Walk 10 feet on uneven surface  activity   Assist Walk 10 feet on uneven surfaces activity did not occur: Safety/medical concerns (due to patient fatigue/weakness)         Wheelchair     Assist Will patient use wheelchair at discharge?: Yes Type of Wheelchair: Manual    Wheelchair assist level: Dependent - Patient 0% (TIS w/c) Max  wheelchair distance: 150    Wheelchair 50 feet with 2 turns activity    Assist        Assist Level: Dependent - Patient 0%   Wheelchair 150 feet activity     Assist      Assist Level: Dependent - Patient 0%    Medical Problem List and Plan: 1. 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    Continue CIR PT, OT, SLP  Pt with intermittent refusal of therapy, tolerates lower intensity , will write for 15/7 , would do well at SNF level to complete IV abx, insurance med director indicates funding for CIR not SNF 2.  Antithrombotics: -DVT/anticoagulation:  Pharmaceutical: Other (comment)--Eliquis.              -antiplatelet therapy: N/A--has been off ASA/Brilinta.  3. Chronic HA/Cervicalgia/Pain Management: Was on flexeril prn PTA. Will monitor N/A.   K pad and cervical spine XR given new neck pain and TTP on cervical spine. 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             Monitor with increased exertion  Muscle rub ordered on 11/26   Appears controlled with meds on 11/30 4. Mood: LCSW to follow for evaluation and support.              -antipsychotic agents: Monitor for now.  5. Neuropsych: This patient is not capable of making decisions on his own behalf. 6. Skin/Wound Care: Routine pressure relief measures. Protein supplement due to evidence of malnutrition.  7. Fluids/Electrolytes/Nutrition: Monitor I/Os.    9. HTN: Monitor BP educate on importance of compliance. SBP goal 130-150   Vitals:   03/27/20 2004 03/28/20 0500  BP: 137/64 (!) 120/51  Pulse: (!) 53 (!) 50  Resp: 18 18  Temp: 97.9 F (36.6 C) 98.9 F (37.2 C)  SpO2: 100% 98%    controlled on 12/3 10. ICM/Heart failure with persistent apical  thrombus: Monitor for signs and symptoms of fluid overload. Daily weights. Continue Eliquis 11. A fib: Monitor HR -continue Lanoxin and Eliquis.              Monitor with increased activity 12. Acute on chronic renal failure: Likely due to gastroenteritis.   Resolved  13. Electrolytes abnormalities: Resolved post supplementation 14. Anemia: Recheck iron levels.                    Hemoglobin 9.7 on 11/22, labs ordered for tomorrow 15. T2DM with hyperglycemia: Hgb A1C-6.8.  probable neuropathy trial gabapentin low dose   Will monitor BS ac/hs.Add CM restrictions to diet.              Monitor with increased mobility CBG (last 3)  Recent Labs    03/27/20 1644 03/27/20 2044 03/28/20 0501  GLUCAP 75 129* 103*   Well controlled12/3 16. Aspiration Pneumonitis: no signs of PNA, already on IV Cefazolin, d/c augmentin, neg CXR, nl  WBCs 17.  Post stroke dysphagia:   D1 thins  Advance diet as tolerated 18. MSSA bacteremia  TEE showed no valve vegetations, no other sig structural abnl in report cardiology to f/u with recs  IV cefazolin q 8h, to D/C on 04/05/20.   19.  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

## 2020-03-28 NOTE — Progress Notes (Signed)
Patient ID: [name], male   DOB: 10/07/1940, 79 y.o.   MRN: [id number]  Diagnosis code: R78.81, B95.61, E11.9, I63.511, E43, M54.2, R78,81, [id number], [id number]  Height: 5'11  Weight: 130 lbs   Patient has MSSA bacteremia which requires to be positioned in ways not feasible with a normal bed.  Head must be elevated at least 30 or 45 degrees.     Requires frequent and immediate changes in body position which cannot be achieved with a normal bed.

## 2020-03-28 NOTE — Progress Notes (Addendum)
Occupational Therapy Weekly Progress Note  Patient Details  Name: [name] MRN: [id number] Date of Birth: 1940-09-04  Beginning of progress report period: March 21, 2020 End of progress report period: March 28, 2020  Today's Date: 03/28/2020 OT Individual Time: 7373-0816 OT Individual Time Calculation (min): 45 min    Patient has met 0 of 1 short term goals.  Pt level of agitation/confusion cont to vary per session. Currently completing sit<>stands with RW + L hand orthotic with min A for L hand placement + facilitate L knee extension. Stand-pivots to toilet with mod A for BLE weight shift. Cont to demonstrate min L left lean during static/dynamic sitting/standing balance without an AD. Self-feeds and performs grooming/bathing tasks with RUE, does not attempt to incorporate LUE functionally without A from therapist. Patient continues to demonstrate the following deficits: muscle weakness, unbalanced muscle activation, decreased coordination and decreased motor planning and decreased initiation, decreased attention, decreased awareness, decreased problem solving, decreased safety awareness, decreased memory and delayed processing and therefore will continue to benefit from skilled OT intervention to enhance overall performance with BADL and Reduce care partner burden.  Patient progressing toward long term goals..  Continue plan of care.  OT Short Term Goals Week 4:  OT Short Term Goal 1 (Week 4): Continue working on established LTGs downgraded to mod assist overall.  Skilled Therapeutic Interventions/Progress Updates:    Pt received in TIS eating lunch with  NT present.   Self-Feeding: Min A to hold ice cream to assist with initial LUE grasp on cup.   LB Dressing: Seated in w/c, donned bilat shoes with min A to adjust L shoe over heel. Pt unable to tie shoes 2/2 to min functional use of LUE req total A.   NMR: Played 4 rounds of horseshoes. Sit<>stand with RW + min A for balance  and LUE placement on L Orthotic grip. Pt progressed to strapping in and removing L hand from orthotic throughout session with mod VCs for initiation of task. Maintains dynamic standing balance with mod A to facilitate L knee extension + reorient to midline. Pt retrieved horse shoes from upper right quadrant of visual field, req min A to facilitate L knee extension during weight shift from LLE to RLE.   Pt left in TIS with safety belt alarm engaged, fidget vest in lap, call bell in reach.   Therapy Documentation Precautions:  Precautions Precautions: Fall Precaution Comments: L hemi, L inattention Restrictions Weight Bearing Restrictions: No   ADL: See Care Tool for more details.  Therapy/Group: Individual Therapy  Volanda Napoleon MS, OTR/L Clyda Greener OTR/L   03/28/2020, 2:59 PM

## 2020-03-29 ENCOUNTER — Inpatient Hospital Stay (HOSPITAL_COMMUNITY): Payer: Medicare PPO | Admitting: Physical Therapy

## 2020-03-29 ENCOUNTER — Inpatient Hospital Stay (HOSPITAL_COMMUNITY): Payer: Medicare PPO

## 2020-03-29 DIAGNOSIS — M542 Cervicalgia: Secondary | ICD-10-CM

## 2020-03-29 LAB — GLUCOSE, CAPILLARY
Glucose-Capillary: 82 mg/dL (ref 70–99)
Glucose-Capillary: 155 mg/dL — ABNORMAL HIGH (ref 70–99)
Glucose-Capillary: 108 mg/dL — ABNORMAL HIGH (ref 70–99)
Glucose-Capillary: 182 mg/dL — ABNORMAL HIGH (ref 70–99)
Glucose-Capillary: 205 mg/dL — ABNORMAL HIGH (ref 70–99)

## 2020-03-29 NOTE — Progress Notes (Signed)
Physical Therapy Session Note  Patient Details  Name: [name] MRN: [id number] Date of Birth: 1940/04/28  Today's Date: 03/29/2020 PT Individual Time: 1300-1342 PT Individual Time Calculation (min): 42 min   Short Term Goals: Week 4:  PT Short Term Goal 1 (Week 4): STGs = to LTGs based on ELOS  Skilled Therapeutic Interventions/Progress Updates:    Pt greeted semi-reclined in bed with RN at bedside, pt finishing up his lunch tray. He was agreeable to PT session with minimal effort and reported no pain. Initially, he pleasantly refused OOB mobility but was easily distractible. Donned pants while supine in bed, requiring maxA for threading and modA for pulling over hips. He was able to bridge and clear hips and use his RUE to pull pants up. Supine<>sit with minA with HOB elevated and use of bed features. Donned long sleeve shirt with maxA for threading and modA for pulling over his head, encouraged incorporating LUE for functional dressing. Preformed stand<>pivot transfer with modA and no AD from EOB to TIS w/c with cues for general sequencing, safety approach, and technique. W/c transport for time management to main therapy gym. He ambulated ~141ft, initially with minA, regressing to modA due to L lateral lean, and RW on level surfaces. Cues for increasing B step length, maintaining proximity to RW, and L foot clearance. Pt requesting to return to his room but was redirected into participating in dynamic standing balance with ball toss with +2 assist - therapist providing modA guard while therapy tech tossed beach ball. He was able to incorporate his RUE for this but with frustration would abruptly sit back into his w/c. He was then returned to his room in his TIS w/c and remained seated (reclined) in TIS w/c with safety belt alarm on and call bell alarm in reach.   Therapy Documentation Precautions:  Precautions Precautions: Fall Precaution Comments: L hemi, L inattention Restrictions Weight  Bearing Restrictions: No  Therapy/Group: Individual Therapy  Makayela Secrest P Meha Vidrine PT 03/29/2020, 7:57 AM

## 2020-03-29 NOTE — Progress Notes (Signed)
Speech Language Pathology Daily Session Note  Patient Details  Name: [name] MRN: [id number] Date of Birth: May 03, 1940  Today's Date: 03/29/2020 SLP Individual Time: 9563-8756 SLP Individual Time Calculation (min): 40 min  Short Term Goals: Week 3: SLP Short Term Goal 1 (Week 3): STG=LTG due to remaining length of stay:    Skilled Therapeutic Interventions:   Skilled therapeutic intervention focused on dysphagia. Pt kept eyes closed for most of session but remained alert enough for po trials with dys 2 solids. Rn repored pt shoveled food in mouth for breakfast therefore she had to pull tray away from patient and feed him. Pt fed this session with dys 2 snack. Mastication was slow and he required extended time to chew and swallow each bite. Improved oral clearance with small sips of thin to clear oral cavity. Pt tolerated dys 2 snack and thin liquids via straw with no overt s/sx of aspiration or penetration. Pt eventually stopped responding to SLPs instructions during snack  fell asleep. Po trials ended. Cont with therapy per plan of care. Supervision and assistance required for all meals.   Pain Pain Assessment Pain Scale: Faces Pain Score: 7  Faces Pain Scale: No hurt Pain Location: Neck Pain Descriptors / Indicators: Discomfort Pain Intervention(s): Medication (See eMAR)  Therapy/Group: Individual Therapy  Amil Amen A Garlin Batdorf 03/29/2020, 9:06 AM

## 2020-03-29 NOTE — Progress Notes (Signed)
[name] PHYSICAL MEDICINE & REHABILITATION PROGRESS NOTE   Subjective/Complaints:  C/o back pain, but tylenol and heat seem to take care of it  ROS: Patient denies fever, rash, sore throat, blurred vision, nausea, vomiting, diarrhea, cough, shortness of breath or chest pain,   headache, or mood change.   Objective:   No results found. No results for input(s): WBC, HGB, HCT, PLT in the last 72 hours. No results for input(s): NA, K, CL, CO2, GLUCOSE, BUN, CREATININE, CALCIUM in the last 72 hours.  Intake/Output Summary (Last 24 hours) at 03/29/2020 0945 Last data filed at 03/28/2020 1812 Gross per 24 hour  Intake 270 ml  Output --  Net 270 ml        Physical Exam: Vital Signs Blood pressure 123/65, pulse (!) 52, temperature 98.1 F (36.7 C), resp. rate 18, height 5\' 11"  (1.803 m), weight 65.9 kg, SpO2 98 %.   Constitutional: No distress . Vital signs reviewed. HEENT: EOMI, oral membranes moist Neck: supple Cardiovascular: RRR without murmur. No JVD    Respiratory/Chest: CTA Bilaterally without wheezes or rales. Normal effort    GI/Abdomen: BS +, non-tender, non-distended Ext: no clubbing, cyanosis, or edema Psych: pleasant and cooperative Skin: No evidence of breakdown, no evidence of rash  Dysphonia, appears unchanged Motor: Left upper extremity shoulder abduction 3-/5, distally 4/5 Left lower extremity: 4 -to 4/5 proximal to distal  Assessment/Plan: 1. Functional deficits which require 3+ hours per day of interdisciplinary therapy in a comprehensive inpatient rehab setting.  Physiatrist is providing close team supervision and 24 hour management of active medical problems listed below.  Physiatrist and rehab team continue to assess barriers to discharge/monitor patient progress toward functional and medical goals  Care Tool:  Bathing    Body parts bathed by patient: Face, Front perineal area, Buttocks   Body parts bathed by helper: Buttocks Body parts n/a:  Right arm, Left arm, Chest, Abdomen, Right upper leg, Left upper leg, Right lower leg, Left lower leg (did not attempt this session)   Bathing assist Assist Level: Moderate Assistance - Patient 50 - 74%     Upper Body Dressing/Undressing Upper body dressing   What is the patient wearing?: Pull over shirt    Upper body assist Assist Level: Moderate Assistance - Patient 50 - 74%    Lower Body Dressing/Undressing Lower body dressing      What is the patient wearing?: Pants, Incontinence brief     Lower body assist Assist for lower body dressing: Moderate Assistance - Patient 50 - 74%     Toileting Toileting    Toileting assist Assist for toileting: Maximal Assistance - Patient 25 - 49%     Transfers Chair/bed transfer  Transfers assist     Chair/bed transfer assist level: Moderate Assistance - Patient 50 - 74% Chair/bed transfer assistive device:   Ambulation assist      Assist level: Moderate Assistance - Patient 50 - 74% Assistive device: Walker-rolling Max distance: 144ft   Walk 10 feet activity   Assist     Assist level: Moderate Assistance - Patient - 50 - 74% Assistive device: Walker-rolling   Walk 50 feet activity   Assist Walk 50 feet with 2 turns activity did not occur: Safety/medical concerns (due to patient fatigue/weakness)  Assist level: Moderate Assistance - Patient - 50 - 74% Assistive device: Walker-rolling    Walk 150 feet activity   Assist Walk 150 feet activity did not occur: Safety/medical concerns (due to  patient fatigue/weakness)  Assist level: Moderate Assistance - Patient - 50 - 74% Assistive device: Walker-rolling    Walk 10 feet on uneven surface  activity   Assist Walk 10 feet on uneven surfaces activity did not occur: Safety/medical concerns (due to patient fatigue/weakness)         Wheelchair     Assist Will patient use wheelchair at discharge?: Yes Type of Wheelchair:  Manual    Wheelchair assist level: Dependent - Patient 0% (TIS w/c) Max wheelchair distance: 150    Wheelchair 50 feet with 2 turns activity    Assist        Assist Level: Dependent - Patient 0%   Wheelchair 150 feet activity     Assist      Assist Level: Dependent - Patient 0%    Medical Problem List and Plan: 1. 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    Continue CIR PT, OT, SLP  15/7 therapy d/t tolerance/participation , would do well at SNF level to complete IV abx, insurance med director indicates funding for CIR not SNF 2.  Antithrombotics: -DVT/anticoagulation:  Pharmaceutical: Other (comment)--Eliquis.              -antiplatelet therapy: N/A--has been off ASA/Brilinta.  3. Chronic HA/Cervicalgia/Pain Management: Was on flexeril prn PTA. Will monitor N/A.   K pad and cervical spine XR given new neck pain and TTP on cervical spine. 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             Monitor with increased exertion  Muscle rub ordered on 11/26   Appears controlled 12/4 4. Mood: LCSW to follow for evaluation and support.              -antipsychotic agents: Monitor for now.  5. Neuropsych: This patient is not capable of making decisions on his own behalf. 6. Skin/Wound Care: Routine pressure relief measures. Protein supplement due to evidence of malnutrition.  7. Fluids/Electrolytes/Nutrition: Monitor I/Os.    9. HTN: Monitor BP educate on importance of compliance. SBP goal 130-150   Vitals:   03/29/20 0547 03/29/20 0805  BP: 123/65   Pulse: (!) 57 (!) 52  Resp: 18   Temp: 98.1 F (36.7 C)   SpO2: 98%     controlled on 12/4 10. ICM/Heart failure with persistent apical thrombus: Monitor for  signs and symptoms of fluid overload. Daily weights. Continue Eliquis 11. A fib: Monitor HR -continue Lanoxin and Eliquis.              Monitor with increased activity 12. Acute on chronic renal failure: Likely due to gastroenteritis.   Resolved  13. Electrolytes abnormalities: Resolved post supplementation 14. Anemia: Recheck iron levels.                    Hemoglobin 9.7 on 11/22  15. T2DM with hyperglycemia: Hgb A1C-6.8.  probable neuropathy trial gabapentin low dose   Will monitor BS ac/hs.Add CM restrictions to diet.              Monitor with increased mobility CBG (last 3)  Recent Labs    03/28/20 1701 03/28/20 2058 03/29/20 0548  GLUCAP 126* 209* 82   Well controlled12/4 16. Aspiration Pneumonitis: no signs of PNA, already on IV Cefazolin, d/c augmentin, neg CXR, nl WBCs  17.  Post stroke dysphagia:   D1 thins  Advance diet as tolerated 18. MSSA bacteremia  TEE showed no valve vegetations, no other sig structural abnl in report cardiology to f/u with recs  IV cefazolin q 8h, to D/C on 04/05/20.   19.  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

## 2020-03-29 NOTE — Progress Notes (Signed)
Physical Therapy Session Note  Patient Details  Name: [name] MRN: [id number] Date of Birth: 09/30/1940  Today's Date: 03/29/2020 PT Individual Time: 1115-1130 PT Individual Time Calculation (min): 15 min   Short Term Goals: Week 4:  PT Short Term Goal 1 (Week 4): STGs = to LTGs based on ELOS  Skilled Therapeutic Interventions/Progress Updates:   Pt received supine in bed pt requesting to go home. When redirected to situation and location pt became verbally agitated, demanding that he be sent home and his IV be removed. PT attempted to educate pt on need to keep IV in until antibiotics treatment is complete. Pt showed no improved awareness to situation and becoming increasingly agitated. Pt assisted to scoot up in bed for improved position in bed for decreased back and neck pain.    Pt left supine in bed with call bell in bed and all needs met.       Therapy Documentation Precautions:  Precautions Precautions: Fall Precaution Comments: L hemi, L inattention Restrictions Weight Bearing Restrictions: No General:   Vital Signs: Therapy Vitals Temp: 98 F (36.7 C) Temp Source: Oral Pulse Rate: (!) 56 Resp: 16 BP: (!) 114/56 Patient Position (if appropriate): Lying Oxygen Therapy SpO2: 98 % O2 Device: Room Air Pain: Faces: hurts little.  Low Back. Pt repositioned.    Therapy/Group: Individual Therapy  Lorie Phenix 03/29/2020, 4:37 PM

## 2020-03-30 ENCOUNTER — Inpatient Hospital Stay (HOSPITAL_COMMUNITY): Payer: Medicare PPO | Admitting: Speech Pathology

## 2020-03-30 ENCOUNTER — Inpatient Hospital Stay (HOSPITAL_COMMUNITY): Payer: Medicare PPO | Admitting: Physical Therapy

## 2020-03-30 ENCOUNTER — Inpatient Hospital Stay (HOSPITAL_COMMUNITY): Payer: Medicare PPO

## 2020-03-30 LAB — URINALYSIS, ROUTINE W REFLEX MICROSCOPIC
Bilirubin Urine: NEGATIVE
Glucose, UA: NEGATIVE mg/dL
Hgb urine dipstick: NEGATIVE
Ketones, ur: NEGATIVE mg/dL
Leukocytes,Ua: NEGATIVE
Nitrite: NEGATIVE
Protein, ur: NEGATIVE mg/dL
Specific Gravity, Urine: 1.025 (ref 1.005–1.030)
pH: 5 (ref 5.0–8.0)

## 2020-03-30 LAB — GLUCOSE, CAPILLARY
Glucose-Capillary: 93 mg/dL (ref 70–99)
Glucose-Capillary: 129 mg/dL — ABNORMAL HIGH (ref 70–99)
Glucose-Capillary: 157 mg/dL — ABNORMAL HIGH (ref 70–99)
Glucose-Capillary: 135 mg/dL — ABNORMAL HIGH (ref 70–99)

## 2020-03-30 MED ORDER — TRAMADOL HCL 50 MG PO TABS
25.0000 mg | ORAL_TABLET | Freq: Three times a day (TID) | ORAL | Status: DC | PRN
  Administered 2020-03-30 – 2020-04-13 (×17): 25 mg via ORAL
  Filled 2020-03-30 (×19): qty 1

## 2020-03-30 NOTE — Progress Notes (Signed)
Occupational Therapy Session Note  Patient Details  Name: [name] MRN: [id number] Date of Birth: March 17, 1941  Today's Date: 03/30/2020 OT Individual Time: 5686-1683 OT Individual Time Calculation (min): 30 min    Short Term Goals: Week 4:  OT Short Term Goal 1 (Week 4): Continue working on established LTGs downgraded to mod assist overall.  Skilled Therapeutic Interventions/Progress Updates:    Pt received supine with no c/o pain, requesting to get up to chair. Pt required mod directional cueing for reduced impulsivity and following commands to safely perform sit > stand and stand pivot with the RW to the TIS w/c. Min A overall. Pt was wheeled into the bathroom where he completed a stand pivot to the toilet with min A, requiring mod A for clothing management. He voided small BM and urine. Pt required mod A for peri hygiene. Pt returned to his w/c and then to bed (despite encouragement to stay up from OT). Pt requested a cup of coffee and one was provided to him. Full supervision provided, and then passed off to nurse in room as pt was left supine. Bed alarm set.   Therapy Documentation Precautions:  Precautions Precautions: Fall Precaution Comments: L hemi, L inattention Restrictions Weight Bearing Restrictions: No   Therapy/Group: Individual Therapy  Crissie Reese 03/30/2020, 12:38 PM

## 2020-03-30 NOTE — Progress Notes (Signed)
Physical Therapy Session Note  Patient Details  Name: [name] MRN: [id number] Date of Birth: 29-Apr-1940  Today's Date: 03/30/2020 PT Individual Time: 1400-1425 PT Individual Time Calculation (min): 25 min  PT Amount of Missed Time (min): 20 Minutes PT Missed Treatment Reason: Patient unwilling to participate  Short Term Goals: Week 4:  PT Short Term Goal 1 (Week 4): STGs = to LTGs based on ELOS  Skilled Therapeutic Interventions/Progress Updates:    Pt received seated almost to EOB attempting to get out of bed due to urgency with needing to use the bathroom. Pt shows no awareness of safety precautions and need to call for assist when attempting to get up. Assisted pt with stand pivot transfer to TIS with mod A and assist from a 2nd person for safety due to patient urgency. Stand pivot transfer TIS to/from toilet with mod A. Pt able to continently void once seated on toilet (see Flowsheet for details). While in standing pt able to perform some pericare with min to mod A for standing balance. Attempt to assist pt with thoroughness of pericare and pt becomes verbally agitated and asks staff member to stop assisting him. Education with patient about importance of cleaning his bottom to prevent skin breakdown. Pt remains adamant that he is clean. Attempt to assist pt with donning a new brief while in standing, pt adamant that he returns to bed. Stand pivot transfer TIS to bed with mod A. Attempt to have pt stand up from bed in order to don brief, pt lies down at Supervision level. Pt refuses to get back up to side of bed. Rolling L/R with min A in order to complete donning of brief. Pt declines any further participation in therapy session despite encouragement. Pt missed 20 min of scheduled therapy session due to refusal to further participate in session.  Therapy Documentation Precautions:  Precautions Precautions: Fall Precaution Comments: L hemi, L inattention Restrictions Weight Bearing  Restrictions: No General: PT Amount of Missed Time (min): 20 Minutes PT Missed Treatment Reason: Patient unwilling to participate    Therapy/Group: Individual Therapy   Peter Congo, PT, DPT  03/30/2020, 2:37 PM

## 2020-03-30 NOTE — Progress Notes (Signed)
Speech Language Pathology Daily Session Note  Patient Details  Name: [name] MRN: [id number] Date of Birth: 12-11-1940  Today's Date: 03/30/2020 SLP Individual Time: 5449-2010 SLP Individual Time Calculation (min): 43 min  Short Term Goals: Week 3: SLP Short Term Goal 1 (Week 3): STG=LTG due to remaining length of stay  Skilled Therapeutic Interventions:  Pt was seen for skilled ST targeting cognitive goals.  Upon arrival, pt was laying on his side, awake, and alert.  When asked how his day was, pt stated he wished he was "at a party."  When questioned further pt reported that he wanted to listen to music.  He was able to list specific artists and songs and sing along with the songs when played on Youtube.  Throughout the session, pt was intermittently perseverative on wanting to go to the bathroom and would become agitated with attempts to get him to remain in the bed (RN informed SLP that pt had already attempted to void and encouraged pt to wait until the end of scheduled therapy session).  Listening to music appeared to momentarily de-escalate pt's agitation and redirected pt's attention to something more calming and positive; however, after 4 songs pt became insistent that he be allowed to use the restroom.  Nurse tech was called in and was agreeable to transferring pt to commode where he was unsuccessful at voiding.  Throughout transfers with [facility] lift pt needed mod assist multimodal cues for safety due to impulsivity and decreased task sequencing.  Pt was returned to bed with bed alarm set and call bell within reach.  Music left playing in the attempts of keeping pt calmly engaged and stimulated.  Continue per current plan of care.    Pain Pain Assessment Pain Scale: 0-10 Pain Score: 0-No pain  Therapy/Group: Individual Therapy  Kimiyah Blick, Melanee Spry 03/30/2020, 4:15 PM

## 2020-03-30 NOTE — Progress Notes (Signed)
[place] PHYSICAL MEDICINE & REHABILITATION PROGRESS NOTE   Subjective/Complaints:  C/o neck,back pain. Tylenol helps. Not using kpad however even though it's sitting right next to him  ROS: Patient denies fever, rash, sore throat, blurred vision, nausea, vomiting, diarrhea, cough, shortness of breath or chest pain,  headache, or mood change.    Objective:   No results found. No results for input(s): WBC, HGB, HCT, PLT in the last 72 hours. No results for input(s): NA, K, CL, CO2, GLUCOSE, BUN, CREATININE, CALCIUM in the last 72 hours.  Intake/Output Summary (Last 24 hours) at 03/30/2020 1002 Last data filed at 03/29/2020 2015 Gross per 24 hour  Intake 120 ml  Output --  Net 120 ml        Physical Exam: Vital Signs Blood pressure (!) 156/72, pulse (!) 101, temperature 97.9 F (36.6 C), resp. rate 16, height 5\' 11"  (1.803 m), weight 65.7 kg, SpO2 99 %.   Constitutional: No distress . Vital signs reviewed. HEENT: EOMI, oral membranes moist Neck: supple Cardiovascular: RRR without murmur. No JVD    Respiratory/Chest: CTA Bilaterally without wheezes or rales. Normal effort    GI/Abdomen: BS +, non-tender, non-distended Ext: no clubbing, cyanosis, or edema Psych: pleasant and cooperative Skin: No evidence of breakdown, no evidence of rash Neuro: dysphonic, fair insight and awareness Motor: Left upper extremity shoulder abduction 3-/5, distally 4/5 Left lower extremity: 4 -to 4/5 proximal to distal  Assessment/Plan: 1. Functional deficits which require 3+ hours per day of interdisciplinary therapy in a comprehensive inpatient rehab setting.  Physiatrist is providing close team supervision and 24 hour management of active medical problems listed below.  Physiatrist and rehab team continue to assess barriers to discharge/monitor patient progress toward functional and medical goals  Care Tool:  Bathing    Body parts bathed by patient: Face, Front perineal area, Buttocks    Body parts bathed by helper: Buttocks Body parts n/a: Right arm, Left arm, Chest, Abdomen, Right upper leg, Left upper leg, Right lower leg, Left lower leg (did not attempt this session)   Bathing assist Assist Level: Moderate Assistance - Patient 50 - 74%     Upper Body Dressing/Undressing Upper body dressing   What is the patient wearing?: Pull over shirt    Upper body assist Assist Level: Moderate Assistance - Patient 50 - 74%    Lower Body Dressing/Undressing Lower body dressing      What is the patient wearing?: Pants, Incontinence brief     Lower body assist Assist for lower body dressing: Moderate Assistance - Patient 50 - 74%     Toileting Toileting    Toileting assist Assist for toileting: Maximal Assistance - Patient 25 - 49%     Transfers Chair/bed transfer  Transfers assist     Chair/bed transfer assist level: Moderate Assistance - Patient 50 - 74% Chair/bed transfer assistive device:   Ambulation assist      Assist level: Moderate Assistance - Patient 50 - 74% Assistive device: Walker-rolling Max distance: 133ft   Walk 10 feet activity   Assist     Assist level: Moderate Assistance - Patient - 50 - 74% Assistive device: Walker-rolling   Walk 50 feet activity   Assist Walk 50 feet with 2 turns activity did not occur: Safety/medical concerns (due to patient fatigue/weakness)  Assist level: Moderate Assistance - Patient - 50 - 74% Assistive device: Walker-rolling    Walk 150 feet activity   Assist Walk 150 feet activity  did not occur: Safety/medical concerns (due to patient fatigue/weakness)  Assist level: Moderate Assistance - Patient - 50 - 74% Assistive device: Walker-rolling    Walk 10 feet on uneven surface  activity   Assist Walk 10 feet on uneven surfaces activity did not occur: Safety/medical concerns (due to patient fatigue/weakness)         Wheelchair     Assist Will  patient use wheelchair at discharge?: Yes Type of Wheelchair: Manual    Wheelchair assist level: Dependent - Patient 0% (TIS w/c) Max wheelchair distance: 150    Wheelchair 50 feet with 2 turns activity    Assist        Assist Level: Dependent - Patient 0%   Wheelchair 150 feet activity     Assist      Assist Level: Dependent - Patient 0%    Medical Problem List and Plan: 1. 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    Continue CIR PT, OT, SLP  15/7 therapy d/t tolerance/participation , would do well at SNF level to complete IV abx, insurance med director indicates funding for CIR not SNF 2.  Antithrombotics: -DVT/anticoagulation:  Pharmaceutical: Other (comment)--Eliquis.              -antiplatelet therapy: N/A--has been off ASA/Brilinta.  3. Chronic HA/Cervicalgia/Pain Management: Was on flexeril prn PTA. Will monitor N/A.   K pad and cervical spine XR given new neck pain and TTP on cervical spine.  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             Monitor with increased exertion  Muscle rub ordered on 11/26   12/5 encouraged him to use kpad! 4. Mood: LCSW to follow for evaluation and support.              -antipsychotic agents: Monitor for now.  5. Neuropsych: This patient is not capable of making decisions on his own behalf. 6. Skin/Wound Care: Routine pressure relief measures. Protein supplement due to evidence of malnutrition.  7. Fluids/Electrolytes/Nutrition: Monitor I/Os.    9. HTN: Monitor BP educate on importance of compliance. SBP goal 130-150   Vitals:   03/29/20 1953 03/30/20 0627  BP: (!) 146/72 (!) 156/72  Pulse: (!) 54 (!) 101  Resp: 17 16  Temp: 97.7 F (36.5 C) 97.9 F (36.6  C)  SpO2: 91% 99%    controlled on 12/5 10. ICM/Heart failure with persistent apical thrombus: Monitor for signs and symptoms of fluid overload. Daily weights. Continue Eliquis 11. A fib: Monitor HR -continue Lanoxin and Eliquis.              Monitor with increased activity 12. Acute on chronic renal failure: Likely due to gastroenteritis.   Resolved  13. Electrolytes abnormalities: Resolved post supplementation 14. Anemia: Recheck iron levels.                    Hemoglobin 9.7 on 11/22  15. T2DM with hyperglycemia: Hgb A1C-6.8.  probable neuropathy trial gabapentin low dose   Will monitor BS ac/hs.Add CM restrictions to diet.              Monitor with increased mobility CBG (last 3)  Recent Labs    03/29/20 2020 03/29/20 2109 03/30/20 0600  GLUCAP 182* 205* 93   12/5 has typically  been well controlled. Continue current plan 16. 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 MSSA bacteremia  TEE showed no valve vegetations, no other sig structural abnl in report cardiology to f/u with recs  IV cefazolin q 8h, to D/C on 04/05/20.   19.  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

## 2020-03-30 NOTE — Progress Notes (Signed)
   03/30/20 1615  What Happened  Was fall witnessed? No  Was patient injured? No  Patient found other (Comment) (kneeling on left knee on floor mat)  Found by Staff-comment (Dauda RN and Aaliyah NT)  Stated prior activity other (comment) (Pt reports "want to go to [employer]")  Follow Up  MD notified MD Riley Kill  Time MD notified [phone]  Family notified Yes - comment  Time family notified 1630  Additional tests No  Progress note created (see row info) Yes  Adult Fall Risk Assessment  Risk Factor Category (scoring not indicated) Fall has occurred during this admission (document High fall risk)  Patient Fall Risk Level High fall risk  Adult Fall Risk Interventions  Required Bundle Interventions *See Row Information* High fall risk - low, moderate, and high requirements implemented  Additional Interventions Use of appropriate toileting equipment (bedpan, BSC, etc.)  Screening for Fall Injury Risk (To be completed on HIGH fall risk patients) - Assessing Need for Low Bed  Risk For Fall Injury- Low Bed Criteria Previous fall this admission  Will Implement Low Bed and Floor Mats Low bed contraindicated, floor mats in place  Screening for Fall Injury Risk (To be completed on HIGH fall risk patients who do not meet crieteria for Low Bed) - Assessing Need for Floor Mats Only  Risk For Fall Injury- Criteria for Floor Mats Bleeding risk-anticoagulation (not prophylaxis);Confusion/dementia (+NuDESC, CIWA, TBI, etc.);Noncompliant with safety precautions  Will Implement Floor Mats Yes  Pain Assessment  Pain Scale 0-10  Pain Score 0  PCA/Epidural/Spinal Assessment  Respiratory Pattern Regular  Neurological  Neuro (WDL) X  Level of Consciousness Alert  Orientation Level Oriented to person  Cognition Follows commands;Poor attention/concentration;Poor judgement;Impulsive;Poor safety awareness;Memory impairment  Speech Clear  L Hand Grip Weak ;Present  L Foot Dorsiflexion Present;Weak  L Foot Plantar  Flexion Present;Weak  RUE Motor Response Purposeful movement  RUE Motor Strength 4  LUE Motor Response Purposeful movement  LUE Motor Strength 3  RLE Motor Response Purposeful movement  RLE Motor Strength 4  LLE Motor Response Purposeful movement  Musculoskeletal  Musculoskeletal (WDL) X  Assistive Device Stedy  Generalized Weakness Yes  Weight Bearing Restrictions No  Integumentary  Integumentary (WDL) X  Skin Integrity Abrasion;Ecchymosis  Abrasion Location Knee  Abrasion Location Orientation Bilateral  Abrasion Intervention Foam (to rt)  Ecchymosis Location Face  Ecchymosis Location Orientation Left  Ecchymosis Intervention Other (Comment) (assessed)

## 2020-03-30 NOTE — Progress Notes (Signed)
Staff found pt kneeled beside bed on floor mat. No injuries noted, vitals obtained, assessment complete per [facility]. Bed alarm placed on second setting. Call light in reach. MD Riley Kill notified. No new orders. Daughter [name] notified. No further questions. Mylo Red, LPN

## 2020-03-30 NOTE — Progress Notes (Signed)
MD Riley Kill phoned regarding pt pain. Will continue with PRN tylenol, and Kpad use. Also mentioned pts increased frequency of urination. New orders received. See orders.  Mylo Red, LPN

## 2020-03-31 ENCOUNTER — Inpatient Hospital Stay (HOSPITAL_COMMUNITY): Payer: Medicare PPO | Admitting: Occupational Therapy

## 2020-03-31 ENCOUNTER — Inpatient Hospital Stay (HOSPITAL_COMMUNITY): Payer: Medicare PPO

## 2020-03-31 ENCOUNTER — Inpatient Hospital Stay (HOSPITAL_COMMUNITY): Payer: Medicare PPO | Admitting: Speech Pathology

## 2020-03-31 LAB — CBC
HCT: 28.7 % — ABNORMAL LOW (ref 39.0–52.0)
Hemoglobin: 9.3 g/dL — ABNORMAL LOW (ref 13.0–17.0)
MCH: 30.7 pg (ref 26.0–34.0)
MCHC: 32.4 g/dL (ref 30.0–36.0)
MCV: 94.7 fL (ref 80.0–100.0)
Platelets: 208 10*3/uL (ref 150–400)
RBC: 3.03 MIL/uL — ABNORMAL LOW (ref 4.22–5.81)
RDW: 12.2 % (ref 11.5–15.5)
WBC: 4.5 10*3/uL (ref 4.0–10.5)
nRBC: 0 % (ref 0.0–0.2)

## 2020-03-31 LAB — BASIC METABOLIC PANEL
Anion gap: 10 (ref 5–15)
BUN: 23 mg/dL (ref 8–23)
CO2: 23 mmol/L (ref 22–32)
Calcium: 8.5 mg/dL — ABNORMAL LOW (ref 8.9–10.3)
Chloride: 107 mmol/L (ref 98–111)
Creatinine, Ser: 1.19 mg/dL (ref 0.61–1.24)
GFR, Estimated: >60 mL/min (ref >60)
Glucose, Bld: 95 mg/dL (ref 70–99)
Potassium: 3.9 mmol/L (ref 3.5–5.1)
Sodium: 140 mmol/L (ref 135–145)

## 2020-03-31 LAB — GLUCOSE, CAPILLARY
Glucose-Capillary: 95 mg/dL (ref 70–99)
Glucose-Capillary: 108 mg/dL — ABNORMAL HIGH (ref 70–99)
Glucose-Capillary: 129 mg/dL — ABNORMAL HIGH (ref 70–99)
Glucose-Capillary: 148 mg/dL — ABNORMAL HIGH (ref 70–99)

## 2020-03-31 NOTE — Progress Notes (Signed)
Occupational Therapy Session Note  Patient Details  Name: [name] MRN: [id number] Date of Birth: August 01, 1940  Today's Date: 03/31/2020 OT Individual Time: 1010-1038 OT Individual Time Calculation (min): 28 min    Short Term Goals: Week 4:  OT Short Term Goal 1 (Week 4): Continue working on established LTGs downgraded to mod assist overall.  Skilled Therapeutic Interventions/Progress Updates:    Patient in bed, alert, states that he would like to get dressed.  Supine to sitting edge of bed with CGA,min cues.  Utilized stedy for transfer to toilet due to limited time of session.  Bed and linens wet with urine.  Sit to stand from bed and toilet surface to stedy with CGA.  Continent of bowel.   Max A for clothing management and hygiene.  Completed dressing seated in w/c - mod A for OH shirt (cues due to inattention of left side), mod A pants.  SPT w/c to bed with min A, cues for midline.  Sit to supine CS.  He remained in bed at close of session, bed alarm set and call bell/tray table in reach.    Therapy Documentation Precautions:  Precautions Precautions: Fall Precaution Comments: L hemi, L inattention Restrictions Weight Bearing Restrictions: No  Therapy/Group: Individual Therapy  Barrie Lyme 03/31/2020, 7:38 AM

## 2020-03-31 NOTE — Progress Notes (Signed)
Nutrition Follow-up  DOCUMENTATION CODES:   Severe malnutrition in context of chronic illness  INTERVENTION:   -Continue Ensure Enlive poTID, each supplement provides 350 kcal and 20 grams of protein  - Continue ProSource Plus 30 ml po BID, each supplement provides 100 kcal and 15 grams of protein  - Continue MagicCupBID with meals, each supplement provides 290 kcal and 9 grams of protein  - Continue Juven BID to aid in wound healing  - Continue to encourage adequate PO intake and provide feeding assistance as needed  NUTRITION DIAGNOSIS:   Severe Malnutrition related to chronic illness (COPD, CVA) as evidenced by severe muscle depletion, severe fat depletion.  Ongoing  GOAL:   Patient will meet greater than or equal to 90% of their needs  Progressing  MONITOR:   PO intake, Supplement acceptance, Diet advancement, Labs, Weight trends, Skin  REASON FOR ASSESSMENT:   Consult Diet education  ASSESSMENT:   79 year old male with PMH of T2DM, HTN, CAD s/p CABG in 2020, ICM, cardioembolic CVA, COPD who was admitted on 02/28/20 after being found on the floor with left hemiparesis, facial droop, and multiple abrasions. 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 Hospital course further complicated by post-stroke dysphagia. Pt started on a dysphagia 1 diet with nectar-thick liquids. Admitted to CIR on 11/10.  11/23 - MBS, diet advanced to dysphagia 1 with thin liquids  Noted target d/c date is now 04/05/20 due to IV abx.  Pt continues to eat well and accept ~75% of supplements per [facility] documentation. Pt unavailable at time of RD visit. Will continue with current regimen.  CIR admit weight: 65.4 kg Current weight: 65.7 kg  Meal Completion: 80-100%  Medications  reviewed and include: ProSource Plus BID, Ensure Enlive TID, SSI, metformin, Juven, vitamin C, IV abx  Labs reviewed: hemoglobin 9.3 CBG's: 95-157 x 24 hours  Diet Order:   Diet Order            DIET - DYS 1 Room service appropriate? Yes; Fluid consistency: Thin  Diet effective now                 EDUCATION NEEDS:   Not appropriate for education at this time  Skin:  Skin Assessment: Skin Integrity Issues: Incisions: face Other: non-pressure wounds to bilateral knees, non-pressure wound to left shoulder  Last BM:  03/31/20 medium type 4  Height:   Ht Readings from Last 1 Encounters:  03/10/20 5\' 11"  (1.803 m)    Weight:   Wt Readings from Last 1 Encounters:  03/30/20 65.7 kg    BMI:  Body mass index is 20.2 kg/m.  Estimated Nutritional Needs:   Kcal:  1800-2000  Protein:  85-100 grams  Fluid:  1.8-2.0 L    14/05/21, MS, RD, LDN Inpatient Clinical Dietitian Please see [facility] for contact information.

## 2020-03-31 NOTE — Progress Notes (Signed)
Patient ID: [name], male   DOB: January 17, 1941, 79 y.o.   MRN: [id number]  Daughter requesting daughter to follow up with [facility] bed availability. Facility currently does not have any male beds for LTC.   [place], [place] [phone]

## 2020-03-31 NOTE — Progress Notes (Addendum)
Occupational Therapy Session Note  Patient Details  Name: [name] MRN: [id number] Date of Birth: 22-May-1940  Today's Date: 03/31/2020 OT Individual Time: 1341-1434 OT Individual Time Calculation (min): 53 min    Short Term Goals: Week 4:  OT Short Term Goal 1 (Week 4): Continue working on established LTGs downgraded to mod assist overall.  Skilled Therapeutic Interventions/Progress Updates:    Pt received sup in bed with bed alarm engaged. Agreeable to eat lunch.  Functional Mobility/Transfers: Sup > sit EOB with mod A to lift trunk and bring BLE off bed.   Self Feeding: Sitting EOB, ate lunch with intermittent mod A to bring back to midline 2/2 L lean. Facilitated pt to WB through LUE to support upright position with max A to maintain consistent WB. Self-feeds with RUE. Req mod A to initiate L grasp on ice cream cup/salt packets while he opens with his R hand. Req max VCs and tactile cues to follow dysphasia precautions per SLP plan, pt is impulsive with taking big bites before swallowing previous bite. Had one episode of coughing during meal.  Transitioned to TIS to finish meal 2/2 increasing L lean via stand-pivot with mod A for LLE block + to facilitate weight shift from RLE to LLE. Returned to bed via stand-pivot from [place] with mod A. Sit EOB > side-lying with min A to help BLE on bed.   Cognition: Pt orientated to situation and type of building (states he had a stroke and is in a hospital) but is not orientated to location or hospital name. Stated he was previously in the [facility] but that he wasn't in [place]. Stated the day was Saturday and not Monday.   Pt cont to be primarily limited by frequent agitation, impaired cognition, decreased functional strength/balance, and decreased functional use of LUE and will cont to benefit from skilled CIR OT to maximize functional mobility, ADL/IADL performance, and to reduce caregiver burden.   Pt left with HOB at 30 degrees,  sidelying in bed with bed alarm engaged, call bell in reach, and all needs met.  Therapy Documentation Precautions:  Precautions Precautions: Fall Precaution Comments: L hemi, L inattention Restrictions Weight Bearing Restrictions: No  Pain:  Pain Assessment Pain Scale: Faces Faces Pain Scale: Hurts a little bit Pain Type: Acute pain Pain Location: Neck Pain Intervention(s): Repositioned ADL: See Care Tool for more details.  Therapy/Group: Individual Therapy  Volanda Napoleon MS, OTR/L Clyda Greener OTR/L  03/31/2020, 3:42 PM

## 2020-03-31 NOTE — Progress Notes (Addendum)
Patient ID: [name], male   DOB: [birth year], 79 y.o.   MRN: [id number]  Daughter requesting daughter to follow up with [facility] LTC bed availability. LTC beds are current available. Patient referral information sent. Daughter aware.   [place], [place] [phone]

## 2020-03-31 NOTE — Progress Notes (Signed)
[facility] PHYSICAL MEDICINE & REHABILITATION PROGRESS NOTE   Subjective/Complaints:  Still with reduced safety awareness, tries to get out of bed, reading paper, discussed football, [employer] game  ROS: Patient denies CP, SOB, N/V/D   Objective:   No results found. Recent Labs    03/31/20 0618  WBC 4.5  HGB 9.3*  HCT 28.7*  PLT 208   Recent Labs    03/31/20 0618  NA 140  K 3.9  CL 107  CO2 23  GLUCOSE 95  BUN 23  CREATININE 1.19  CALCIUM 8.5*    Intake/Output Summary (Last 24 hours) at 03/31/2020 1017 Last data filed at 03/31/2020 0830 Gross per 24 hour  Intake 710 ml  Output 760 ml  Net -50 ml        Physical Exam: Vital Signs Blood pressure (!) 150/69, pulse (!) 51, temperature 98 F (36.7 C), resp. rate 17, height 5\' 11"  (1.803 m), weight 65.7 kg, SpO2 99 %.    General: No acute distress Mood and affect are appropriate Heart: Regular rate and rhythm no rubs murmurs or extra sounds Lungs: Clear to auscultation, breathing unlabored, no rales or wheezes Abdomen: Positive bowel sounds, soft nontender to palpation, nondistended Extremities: No clubbing, cyanosis, or edema Skin: No evidence of breakdown, no evidence of rash   Skin: No evidence of breakdown, no evidence of rash Neuro: dysphonic, fair insight and awareness Motor: Left upper extremity shoulder abduction 3-/5, distally 4/5 Left lower extremity: 4 -to 4/5 proximal to distal  Assessment/Plan: 1. Functional deficits which require 3+ hours per day of interdisciplinary therapy in a comprehensive inpatient rehab setting.  Physiatrist is providing close team supervision and 24 hour management of active medical problems listed below.  Physiatrist and rehab team continue to assess barriers to discharge/monitor patient progress toward functional and medical goals  Care Tool:  Bathing    Body parts bathed by patient: Face, Front perineal area, Buttocks   Body parts bathed by helper:  Buttocks Body parts n/a: Right arm, Left arm, Chest, Abdomen, Right upper leg, Left upper leg, Right lower leg, Left lower leg (did not attempt this session)   Bathing assist Assist Level: Moderate Assistance - Patient 50 - 74%     Upper Body Dressing/Undressing Upper body dressing   What is the patient wearing?: Pull over shirt    Upper body assist Assist Level: Moderate Assistance - Patient 50 - 74%    Lower Body Dressing/Undressing Lower body dressing      What is the patient wearing?: Pants, Incontinence brief     Lower body assist Assist for lower body dressing: Moderate Assistance - Patient 50 - 74%     Toileting Toileting    Toileting assist Assist for toileting: Maximal Assistance - Patient 25 - 49%     Transfers Chair/bed transfer  Transfers assist     Chair/bed transfer assist level: Moderate Assistance - Patient 50 - 74% Chair/bed transfer assistive device:   Ambulation assist      Assist level: Moderate Assistance - Patient 50 - 74% Assistive device: Walker-rolling Max distance: 159ft   Walk 10 feet activity   Assist     Assist level: Moderate Assistance - Patient - 50 - 74% Assistive device: Walker-rolling   Walk 50 feet activity   Assist Walk 50 feet with 2 turns activity did not occur: Safety/medical concerns (due to patient fatigue/weakness)  Assist level: Moderate Assistance - Patient - 50 - 74% Assistive device: Walker-rolling  Walk 150 feet activity   Assist Walk 150 feet activity did not occur: Safety/medical concerns (due to patient fatigue/weakness)  Assist level: Moderate Assistance - Patient - 50 - 74% Assistive device: Walker-rolling    Walk 10 feet on uneven surface  activity   Assist Walk 10 feet on uneven surfaces activity did not occur: Safety/medical concerns (due to patient fatigue/weakness)         Wheelchair     Assist Will patient use wheelchair at discharge?:  Yes Type of Wheelchair: Manual    Wheelchair assist level: Dependent - Patient 0% (TIS w/c) Max wheelchair distance: 150    Wheelchair 50 feet with 2 turns activity    Assist        Assist Level: Dependent - Patient 0%   Wheelchair 150 feet activity     Assist      Assist Level: Dependent - Patient 0%    Medical Problem List and Plan: 1. 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    Continue CIR PT, OT, SLP  15/7 therapy d/t tolerance/participation , would do well at SNF level to complete IV abx, insurance med director indicates funding for CIR not SNF Plan D/C 12/11                                                                                                          2.  Antithrombotics: -DVT/anticoagulation:  Pharmaceutical: Other (comment)--Eliquis.              -antiplatelet therapy: N/A--has been off ASA/Brilinta.  3. Chronic HA/Cervicalgia/Pain Management: Was on flexeril prn PTA. Will monitor N/A.   K pad and cervical spine XR given new neck pain and TTP on cervical spine.  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             Monitor with increased exertion  Muscle rub ordered on 11/26   12/5 encouraged him to use kpad! 4. Mood: LCSW to follow for evaluation and support.              -antipsychotic agents: Monitor for now.  5. Neuropsych: This patient is not capable of making decisions on his own behalf. 6. Skin/Wound Care: Routine pressure relief measures. Protein supplement due to evidence of malnutrition.  7. Fluids/Electrolytes/Nutrition: Monitor I/Os.    9. HTN: Monitor BP educate on importance of compliance. SBP goal 130-150   Vitals:   03/30/20 2000 03/31/20 0410  BP: (!) 152/64 (!)  150/69  Pulse: (!) 56 (!) 51  Resp: 15 17  Temp: 97.7 F (36.5 C) 98 F (36.7 C)  SpO2: 100% 99%    controlled on 12/6 in desired range 10. ICM/Heart failure with persistent apical thrombus: Monitor for signs and symptoms of fluid overload. Daily weights. Continue Eliquis 11. A fib: Monitor HR -continue Lanoxin and Eliquis.  Monitor with increased activity 12. Acute on chronic renal failure: Likely due to gastroenteritis.   Resolved  13. Electrolytes abnormalities: Resolved post supplementation 14. Anemia: Recheck iron levels.                    Hemoglobin 9.7 on 11/22  15. T2DM with hyperglycemia: Hgb A1C-6.8.  probable neuropathy trial gabapentin low dose   Will monitor BS ac/hs.Add CM restrictions to diet.              Monitor with increased mobility CBG (last 3)  Recent Labs    03/30/20 1620 03/30/20 2108 03/31/20 0604  GLUCAP 157* 135* 95   Controlled 12/6 16. 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 MSSA bacteremia  TEE showed no valve vegetations, no other sig structural abnl in report cardiology to f/u with recs  IV cefazolin q 8h, to D/C on 04/05/20.   19.  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

## 2020-03-31 NOTE — Progress Notes (Signed)
Physical Therapy Session Note  Patient Details  Name: [name] MRN: [id number] Date of Birth: [birth year]  Today's Date: 03/31/2020 PT Individual Time: 1115-1200 PT Individual Time Calculation (min): 45 min   Short Term Goals: Week 1:  PT Short Term Goal 1 (Week 1): Patient will be able to achieve supine > sit with CGA consistently PT Short Term Goal 1 - Progress (Week 1): Progressing toward goal PT Short Term Goal 2 (Week 1): Patient will transfer bed<> wc with MinA x1 at least 50% of the time PT Short Term Goal 2 - Progress (Week 1): Progressing toward goal PT Short Term Goal 3 (Week 1): Patient will ambulate >3f with MinA x2 with LRAD PT Short Term Goal 3 - Progress (Week 1): Progressing toward goal PT Short Term Goal 4 (Week 1): Patient will complete sit <> stand with no more than ModA x1 and LRAD PT Short Term Goal 4 - Progress (Week 1): Met Week 2:  PT Short Term Goal 1 (Week 2): Pt will perform supine<>sit with min assist PT Short Term Goal 1 - Progress (Week 2): Progressing toward goal PT Short Term Goal 2 (Week 2): Pt will perform sit<>stands using LRAD with min assist PT Short Term Goal 2 - Progress (Week 2): Progressing toward goal PT Short Term Goal 3 (Week 2): Pt will perform bed<>chair transfers using LRAD with min assist PT Short Term Goal 3 - Progress (Week 2): Progressing toward goal PT Short Term Goal 4 (Week 2): Pt will ambulate at least 53fusing LRAD with min assist of 1 PT Short Term Goal 4 - Progress (Week 2): Progressing toward goal Week 3:  PT Short Term Goal 1 (Week 3): STG= LTG based on ELOS PT Short Term Goal 1 - Progress (Week 3): Progressing toward goal  Skilled Therapeutic Interventions/Progress Updates:    PAIN denies pain.  Pt initially sleeping soundly, easily awakens, tells therapist "leave me alone", swats towards therapist but easily redirected.  Oriented pt to time, discussed getting up for lunch.  Pt continues to exclaim "leave me alone' but  progresses to sitting w/min taclile cues, min assist.  Sit to stand w/rw and spt w/rw w/cues for safety, min assist. Pt transported to gym Gait trials as follows: [id number] L from mildly to moderately, poor safety awareness w/turning/backing to wc, falls asleep briefly between trials.  Awakens and exclaims "lets go!"  Stairs: Ascended/descended w/mod assist of 1, tactile cues to facilitate advancement of RUE on rail, wt shifting.  Ascends step over step, descend step to.  Uses single rail w/LUE, leans L  Pt transported back to room.  Sit to stand w/rw w/min assist, L tendency, turn/sidestep L/sit w/rw w/heavy mod due to increasing L lean w/sidestepping.  Sit to supine w/cues.   Pt left supine w/rails up x 4, alarm set, bed in lowest position, and needs in reach. Therapy Documentation Precautions:  Precautions Precautions: Fall Precaution Comments: L hemi, L inattention Restrictions Weight Bearing Restrictions: No   Therapy/Group: Individual Therapy  [name]  [place]/2021, 12:04 PM

## 2020-03-31 NOTE — Progress Notes (Signed)
Patient ID: [name], male   DOB: [birth year], 79 y.o.   MRN: [id number]   Patient referral sent to [facility] [place], [place] [phone]

## 2020-03-31 NOTE — Progress Notes (Signed)
Speech Language Pathology Weekly Progress and Session Note  Patient Details  Name: [name] MRN: [id number] Date of Birth: 07/04/1940  Beginning of progress report period: March 24, 2020 End of progress report period: March 31, 2020  Today's Date: 03/31/2020 SLP Individual Time: 3570-1779 SLP Individual Time Calculation (min): 28 min  Short Term Goals: Week 3: SLP Short Term Goal 1 (Week 3): STG=LTG due to remaining length of stay SLP Short Term Goal 1 - Progress (Week 3): Progressing toward goal    New Short Term Goals: Week 4: SLP Short Term Goal 1 (Week 4): STG=LTG due to remaining length of stay  Weekly Progress Updates: Pt has made functional gains and is slowly progressing toward his long term goals, which were downgraded over the last reporting period. Pt is currently ~Mod assist for basic familiar tasks due to moderately severe behavioral and cognitive deficits impacting his short term memory, attention, safety and intellectual awareness, and problem solving. His participation has been limited during times of increased verbal agitation in comparison to the beginning of his admission to CIR. He also exhibits moderate dysarthria, and due to difficulty self monitoring due to cognitive impairments, requires Mod-Max cueing to implement strategies to compensate (primarily increased vocal intensity). Pt is consuming a dysphagia 1 (puree) diet with thin liquids. Oral holding has decreased since admission, although mastication is still prolonged, he is participating in dys 2 (minced/ground) texture trials with SLP at bedside when he will participate. Pt education is ongoing; no family has been present for ST sessions to date, however family education is scheduled for 04/04/20 with pt's daughter. Pt would continue to benefit from skilled ST while inpatient in order to maximize functional independence and reduce burden of care prior to discharge. Anticipate that pt will need strict 24/7  supervision at discharge in addition to [place] follow up at next level of care - SNF would ultimately be recommended by ST, given severity of pt's cognitive impairments and risk to his safety and functioning at home.      Intensity: Minumum of 1-2 x/day, 30 to 90 minutes Frequency: 3 to 5 out of 7 days Duration/Length of Stay: 04/05/20 Treatment/Interventions: Cognitive remediation/compensation;Cueing hierarchy;Functional tasks;Patient/family education;Therapeutic Activities;Speech/Language facilitation;Dysphagia/aspiration precaution training;Internal/external aids   Daily Session  Skilled Therapeutic Interventions: Pt was seen for skilled ST targeting dysphagia and speech goals. Upon arrival pt was eating his breakfast with NT; SLP took over full supervision and skilled observation of pt consuming purees and thins from tray. Pt exhibited 1 coughing episode when he mixed thins with a bit of puree, creating a dual consistency and suspect more difficulty with bolus cohesion due to oral phase deficits. Education and Min A verbal cues provided regarding avoiding mixed consistencies throughout rest of meal. Recommend continue current diet with full supervision. During speech tasks, pt described personally relevant stories with ~85% intelligible at the sentence level and Moderate verbal cues for intentional use of increased vocal intensity and pacing to better coordinate his respiratory pattern with speech. Pt left sitting upright in bed with call bell in hand, needs met to his satisfaction, and bed alarm set. Continue per current plan of care.        Pain Pain Assessment Pain Scale: 0-10 Pain Score: 0-No pain  Therapy/Group: Individual Therapy  Arbutus Leas 03/31/2020, 7:25 AM

## 2020-03-31 NOTE — Progress Notes (Signed)
Patient ID: [name], male   DOB: June 12, 1940, 79 y.o.   MRN: [id number]  [facility] closed for admission currently.  [place], [place] [phone]

## 2020-03-31 NOTE — Progress Notes (Signed)
Patient ID: [name], male   DOB: 1940-12-22, 79 y.o.   MRN: [id number]   Daughter requesting daughter to follow up with [facility] LTC bed availability. Facility currently does not have any male beds for LTC.   [place], [place] [phone]

## 2020-03-31 NOTE — Progress Notes (Addendum)
Patient ID: [name], male   DOB: 07/13/1940, 79 y.o.   MRN: [id number]  [facility] won't [facility] a bed available for about two weeks. Daughter aware  [place], [place] [phone]

## 2020-04-01 ENCOUNTER — Inpatient Hospital Stay (HOSPITAL_COMMUNITY): Payer: Medicare PPO | Admitting: Occupational Therapy

## 2020-04-01 ENCOUNTER — Inpatient Hospital Stay (HOSPITAL_COMMUNITY): Payer: Medicare PPO | Admitting: Speech Pathology

## 2020-04-01 ENCOUNTER — Inpatient Hospital Stay (HOSPITAL_COMMUNITY): Payer: Medicare PPO | Admitting: Physical Therapy

## 2020-04-01 LAB — GLUCOSE, CAPILLARY
Glucose-Capillary: 80 mg/dL (ref 70–99)
Glucose-Capillary: 124 mg/dL — ABNORMAL HIGH (ref 70–99)
Glucose-Capillary: 105 mg/dL — ABNORMAL HIGH (ref 70–99)
Glucose-Capillary: 222 mg/dL — ABNORMAL HIGH (ref 70–99)

## 2020-04-01 NOTE — Progress Notes (Signed)
Speech Language Pathology Daily Session Note  Patient Details  Name: [name] MRN: [id number] Date of Birth: 1940-10-29  Today's Date: 04/01/2020 SLP Missed Time: 30 Minutes Missed Time Reason: Patient fatigue  Short Term Goals: Week 4: SLP Short Term Goal 1 (Week 4): STG=LTG due to remaining length of stay  Skilled Therapeutic Interventions: Attempted to arouse pt for scheduled therapy session, however he was unable to arouse appropriately for participation despite Max A attempts and interventions. Will continue to attempt to see pt as schedule allows. Pt missed 30 mins skilled ST.       Pain Pain Assessment Pain Scale: 0-10 Pain Score: Asleep  Therapy/Group: Individual Therapy  [name] 04/01/2020, 7:19 AM

## 2020-04-01 NOTE — Progress Notes (Addendum)
Occupational Therapy Session Note  Patient Details  Name: [name] MRN: [id number] Date of Birth: 11-07-1940  Today's Date: 04/01/2020 OT Individual Time: 0900-1000 OT Individual Time Calculation (min): 60 min    Short Term Goals: Week 4:  OT Short Term Goal 1 (Week 4): Continue working on established LTGs downgraded to mod assist overall.  Skilled Therapeutic Interventions/Progress Updates:  Pt received sup in bed with bed alarm engaged in soiled brief. Side roll >L with min A to bend RLE and for R hand placement on bed rail. Mod A to lift trunk + move BLE off bed in side lying > sitting EOB. Sit <> stand with mod A for LLE block + for initial lift + to facilitate trunk flexion. Doffed brief in standing with max A to push down over bilat hips. Stand-pivot to TIS w/c with mod A to facilitate weight shift to LLE + LLE block. Stand-pivot from TIS to toilet with mod A. Did not void but performed anterior peri-care with close supervision in sitting secondary to L lean + impaired dynamic sitting balance.   In TIS at sink, pt bathed anterior peri-area with close supervision + max VCs for task initiation and thoroughness. Sit <> stand with mod A for LLE block during posterior peri-care. Donned brief with overall max A to tighten brief in standing + to pull over hips. Req mod A for the sit<>stand. Pt noted confusion by stating " I see Reyne Dumas peeping in the door" when no one was present. Donned shirt seated in TIS with mod A to thread LUE + pull down. Donned pants with mod A to thread LLE. Mod A for sit <>stand to pull over hips.   Pt completed 10 reps of AAROM of left shoulder flexion, 10 reps of AAROM of bilat wrist flexion utilizing his RUE to grasp his LUE for NMR of LUE. Returned to bed via stand-pivot from [place] with mod A. Min A from sit EOB to lying to readjust legs in center of bed.  Pt left semi-reclined in bed with bed alarm engaged, call bell in reach, all immediate needs met.      Therapy  Documentation Precautions:  Precautions Precautions: Fall Precaution Comments: L hemi, L inattention Restrictions Weight Bearing Restrictions: No  Pain: c/o of generalized pain, per RN medicated this morning prior to session   ADL: See Care Tool for more details.   Therapy/Group: Individual Therapy  Volanda Napoleon MS, OTR/L Clyda Greener OTR/L  04/01/2020, 6:45 AM

## 2020-04-01 NOTE — Progress Notes (Signed)
Patient ID: [name], male   DOB: 1940-05-24, 79 y.o.   MRN: [id number]   Patient received LTC bed offer at [facility]. Daughter aware, feels like facility is too costly.   [place], [place] [phone]

## 2020-04-01 NOTE — Progress Notes (Signed)
Patient ID: [name], male   DOB: 09-01-1940, 79 y.o.   MRN: [id number]   SW received call from [facility] expedited appeals line. Spoke with Charisse March at [phone] requesting clinicals. Sw will fax to [phone].  [place], [place] [phone]

## 2020-04-01 NOTE — Progress Notes (Signed)
Physical Therapy Session Note  Patient Details  Name: [name] MRN: [id number] Date of Birth: [birth year]  Today's Date: 04/01/2020 PT Individual Time: 1355-1503 PT Individual Time Calculation (min): 68 min   Short Term Goals: Week 4:  PT Short Term Goal 1 (Week 4): STGs = to LTGs based on ELOS  Skilled Therapeutic Interventions/Progress Updates:    Pt received in the bathroom with NT assisting him to restroom in the stedy - upon seeing therapist, pt states "come on now, get out of here" with the RN clarifying that pt doesn't want a male assisting him with toileting at this time. Therapist returned as soon as pt completed and he was agreeable to session with encouragement and redirection. Stedy transfer to TIS w/c. Transported to therapy gym in w/c.  Sit>stands w/c>RW with primarily heavy min assist for lifting via facilitation at L LE for hip/knee extension and 2x mod assist due to fatigue towards end of session - continuing to facilitate improved L LE positioning prior to initiating transfer due to excessive hip external rotation - continued max cuing and assist for L UE placement on/off RW orthotic. Gait training ~127ft using RW with progressively heavier min assist due to progressive worsening L lateral lean - therapist motivating pt to continue ambulating by having him search for newspaper at nurses station - cuing for increased L LE foot clearance and step length along with facilitation for R weight shift - +2 w/c follow due to difficulty redirecting patient to ambulate to nearest seat and him requesting to sit quickly. Pt demonstrates improvements in gait requiring decreased assistance along with ability to ambulate further distance with less assistance. Pt reports need to use bathroom, clarified question due to pt toileting at beginning of session with pt confirming. Transported back to room in w/c and then pt grinning, stating "put me in the bed" with therapist reorienting pt that he had  requested to go to the bathroom then him stating he said he wanted to go to the "bedroom" with redirection and encouragement pt agreeable to return to therapy gym. Standing with L UE support on RW and min assist for balance performed dual-cognitive-tasks using BITS system to perform the following challenges: complex array/sequencing, rotator sequence, and memory - once given instructions of task pt able to complete without cuing given slight increased time. Gait training ~157ft using RW as described above with continued cuing for increased L LE foot clearance and step length along with decreased L trunk lean and improved midline posture. Pt starts frequently stating "come on now, let's go" demonstrating some increased agitation but with gentle encouragement and redirection able to continue participating in therapy session. Participated in dynamic standing balance task of shooting basketball without UE support - heavy min assist for balance using only R hand to shoot ball due to L UE paresis. Pt again reports need to use bathroom and transported to bathroom with him again asking for assistance back to bed - continued redirection and relocation into dayroom for change of scenery to promote continued participation in therapy. Gait training ~133ft using RW with min assist due to L lean and impaired L LE foot clearance with cuing as described above, +2 w/c follow. Pt declines further standing at this time therefore performed sitting balance task via playing cornhole to promote increased anterior trunk flexion in sitting - supervision for sitting balance - engaged pt in dual-cognitive task of calculating total number of points scored. Transported back to room and pt requesting for assistance back  to bed. A few step transfer from w/c>EOB including 3x L lateral steping with R HHA and +2 min assist for balance. Sit>supine with CGA for safety and with increased time able to get LEs into bed. Once in supine pt states "come on  now, let's go" "get me out of here" demonstrating continued confusion - therapist provided calming, emotional support. Pt left supine in bed with needs in reach, bed alarm on, and fidget blanket in lap.   Therapy Documentation Precautions:  Precautions Precautions: Fall Precaution Comments: L hemi, L inattention Restrictions Weight Bearing Restrictions: No  Pain: Complains of some generalized pain at end of session but unable to elaborate further - assisted with repositioning for pain management.   Therapy/Group: Individual Therapy  Ginny Forth , PT, DPT, CSRS  04/01/2020, 1:01 PM

## 2020-04-01 NOTE — Progress Notes (Signed)
[facility] PHYSICAL MEDICINE & REHABILITATION PROGRESS NOTE   Subjective/Complaints:  Ate fair for breakfast , discussed D/C date  Daughter looking for LTC  ROS: Patient denies CP, SOB, N/V/D   Objective:   No results found. Recent Labs    03/31/20 0618  WBC 4.5  HGB 9.3*  HCT 28.7*  PLT 208   Recent Labs    03/31/20 0618  NA 140  K 3.9  CL 107  CO2 23  GLUCOSE 95  BUN 23  CREATININE 1.19  CALCIUM 8.5*    Intake/Output Summary (Last 24 hours) at 04/01/2020 0843 Last data filed at 04/01/2020 0826 Gross per 24 hour  Intake 438 ml  Output --  Net 438 ml        Physical Exam: Vital Signs Blood pressure 130/68, pulse (!) 46, temperature 98.2 F (36.8 C), resp. rate 18, height 5\' 11"  (1.803 m), weight 65 kg, SpO2 100 %.    General: No acute distress Mood and affect are appropriate Heart: Regular rate and rhythm no rubs murmurs or extra sounds Lungs: Clear to auscultation, breathing unlabored, no rales or wheezes Abdomen: Positive bowel sounds, soft nontender to palpation, nondistended Extremities: No clubbing, cyanosis, or edema  Skin: No evidence of breakdown, no evidence of rash Neuro: dysphonic, fair insight and awareness Motor: Left upper extremity shoulder abduction 3-/5, distally 4/5 Left lower extremity: 4 -to 4/5 proximal to distal  Assessment/Plan: 1. Functional deficits which require 3+ hours per day of interdisciplinary therapy in a comprehensive inpatient rehab setting.  Physiatrist is providing close team supervision and 24 hour management of active medical problems listed below.  Physiatrist and rehab team continue to assess barriers to discharge/monitor patient progress toward functional and medical goals  Care Tool:  Bathing    Body parts bathed by patient: Face, Front perineal area, Buttocks   Body parts bathed by helper: Buttocks Body parts n/a: Right arm, Left arm, Chest, Abdomen, Right upper leg, Left upper leg, Right lower leg,  Left lower leg (did not attempt this session)   Bathing assist Assist Level: Moderate Assistance - Patient 50 - 74%     Upper Body Dressing/Undressing Upper body dressing   What is the patient wearing?: Pull over shirt    Upper body assist Assist Level: Moderate Assistance - Patient 50 - 74%    Lower Body Dressing/Undressing Lower body dressing      What is the patient wearing?: Pants, Incontinence brief     Lower body assist Assist for lower body dressing: Moderate Assistance - Patient 50 - 74%     Toileting Toileting    Toileting assist Assist for toileting: Maximal Assistance - Patient 25 - 49%     Transfers Chair/bed transfer  Transfers assist     Chair/bed transfer assist level: Moderate Assistance - Patient 50 - 74% Chair/bed transfer assistive device:   Ambulation assist      Assist level: Moderate Assistance - Patient 50 - 74% Assistive device: Walker-rolling Max distance: 125   Walk 10 feet activity   Assist     Assist level: Minimal Assistance - Patient > 75% Assistive device: Walker-rolling   Walk 50 feet activity   Assist Walk 50 feet with 2 turns activity did not occur: Safety/medical concerns (due to patient fatigue/weakness)  Assist level: Moderate Assistance - Patient - 50 - 74% Assistive device: Walker-rolling    Walk 150 feet activity   Assist Walk 150 feet activity did not occur: Safety/medical concerns (  due to patient fatigue/weakness)  Assist level: Moderate Assistance - Patient - 50 - 74% Assistive device: Walker-rolling    Walk 10 feet on uneven surface  activity   Assist Walk 10 feet on uneven surfaces activity did not occur: Safety/medical concerns (due to patient fatigue/weakness)         Wheelchair     Assist Will patient use wheelchair at discharge?: Yes Type of Wheelchair: Manual    Wheelchair assist level: Dependent - Patient 0% (TIS w/c) Max wheelchair  distance: 150    Wheelchair 50 feet with 2 turns activity    Assist        Assist Level: Dependent - Patient 0%   Wheelchair 150 feet activity     Assist      Assist Level: Dependent - Patient 0%    Medical Problem List and Plan: 1. 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    Continue CIR PT, OT, SLP  15/7 therapy d/t tolerance/participation , would do well at SNF level to complete IV abx, insurance med director indicates funding for CIR not SNF Plan D/C 12/11                                                                                                          2.  Antithrombotics: -DVT/anticoagulation:  Pharmaceutical: Other (comment)--Eliquis.              -antiplatelet therapy: N/A--has been off ASA/Brilinta.  3. Chronic HA/Cervicalgia/Pain Management: Was on flexeril prn PTA. Will monitor N/A.   K pad and cervical spine XR given new neck pain and TTP on cervical spine.  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             Monitor with increased exertion  Muscle rub ordered on 11/26   12/5 encouraged him to use kpad! 4. Mood: LCSW to follow for evaluation and support.              -antipsychotic agents: Monitor for now.  5. Neuropsych: This patient is not capable of making decisions on his own behalf. 6. Skin/Wound Care: Routine pressure relief measures. Protein supplement due to evidence of malnutrition.  7. Fluids/Electrolytes/Nutrition: Monitor I/Os.    9. HTN: Monitor BP educate on importance of compliance. SBP goal 130-150   Vitals:   03/31/20 1937 04/01/20 0505  BP: 124/65 130/68  Pulse: 61 (!) 46  Resp: 18 18  Temp: 98.6 F (37 C) 98.2 F (36.8 C)  SpO2: 99% 100%    controlled on 12/7 in  desired range 10. ICM/Heart failure with persistent apical thrombus: Monitor for signs and symptoms of fluid overload. Daily weights. Continue Eliquis 11. A fib: Monitor HR -continue Lanoxin and Eliquis.              Monitor with increased activity 12. Acute on chronic renal failure: Likely due to gastroenteritis.  Resolved  13. Electrolytes abnormalities: Resolved post supplementation 14. Anemia: Recheck iron levels.                    Hemoglobin 9.7 on 11/22  15. T2DM with hyperglycemia: Hgb A1C-6.8.  probable neuropathy trial gabapentin low dose   Will monitor BS ac/hs.Add CM restrictions to diet.              Monitor with increased mobility CBG (last 3)  Recent Labs    03/31/20 1625 03/31/20 2048 04/01/20 0547  GLUCAP 129* 148* 80   Controlled 12/7 16. 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 MSSA bacteremia  TEE showed no valve vegetations, no other sig structural abnl in report cardiology to f/u with recs  IV cefazolin q 8h, to D/C on 04/05/20.   19.  Cognitive deficits from CVA +/- encephalopathy from bacteremia  Trial low dose Klonopin prn, used only intermittently  20 incontinence - mainly due to cognition , cont timed toileting     LOS: 27 days A FACE TO FACE EVALUATION WAS PERFORMED  Erick Colace 04/01/2020, 8:43 AM

## 2020-04-02 ENCOUNTER — Inpatient Hospital Stay (HOSPITAL_COMMUNITY): Payer: Medicare PPO | Admitting: Occupational Therapy

## 2020-04-02 ENCOUNTER — Inpatient Hospital Stay (HOSPITAL_COMMUNITY): Payer: Medicare PPO

## 2020-04-02 ENCOUNTER — Inpatient Hospital Stay (HOSPITAL_COMMUNITY): Payer: Medicare PPO | Admitting: Speech Pathology

## 2020-04-02 LAB — GLUCOSE, CAPILLARY
Glucose-Capillary: 94 mg/dL (ref 70–99)
Glucose-Capillary: 218 mg/dL — ABNORMAL HIGH (ref 70–99)
Glucose-Capillary: 92 mg/dL (ref 70–99)
Glucose-Capillary: 147 mg/dL — ABNORMAL HIGH (ref 70–99)

## 2020-04-02 LAB — DIGOXIN LEVEL: Digoxin Level: 0.7 ng/mL — ABNORMAL LOW (ref 0.8–2.0)

## 2020-04-02 NOTE — Progress Notes (Signed)
Patient ID: [name], male   DOB: 12-11-1940, 79 y.o.   MRN: [id number]   Patient referral sent to [facility] in [place].  [place], [place] [phone]

## 2020-04-02 NOTE — Patient Care Conference (Signed)
Inpatient RehabilitationTeam Conference and Plan of Care Update Date: 04/02/2020   Time: 10:31 AM    Patient Name: [name]      Medical Record Number: [id number]  Date of Birth: 1940/09/12 Sex: Male         Room/Bed: 4W08C/4W08C-01 Payor Info: Payor: [employer] MEDICARE / Plan: [employer] MEDICARE CHOICE PPO / Product Type: *No Product type* /    Admit Date/Time:  03/05/2020  3:24 PM  Primary Diagnosis:  Acute right MCA stroke [facility])  Hospital Problems: Principal Problem:   Acute right MCA stroke (HCC) Active Problems:   Type II diabetes mellitus (HCC)   MSSA bacteremia   Protein-calorie malnutrition, severe   Neck pain   Bacteremia   Dysphagia, post-stroke   Cognitive deficit, post-stroke    Expected Discharge Date: Expected Discharge Date: 04/05/20 (LTC pending)  Team Members Present: Physician leading conference: Dr. Claudette Laws Care Coodinator Present: Chana Bode, RN, BSN, CRRN;Christina Vita Barley, BSW Nurse Present: Other (comment) Freddrick March, RN) PT Present: Merry Lofty, PT OT Present: Perrin Maltese, OT SLP Present: Suzzette Righter, CF-SLP PPS Coordinator present : Fae Pippin, [place], PT     Current Status/Progress Goal Weekly Team Focus  Bowel/Bladder   Cont/incont; last BM 12/7  Become continent x2  Assess every shift and as needed   Swallow/Nutrition/ Hydration   Dys 1 textures, thin liquids, oral holding and clearance improving some, Min A  Min A  carryover swallow stategies, upgrading to Dys 2 textures, education with daughter   ADL's   close sup for pericare when seated, min to mod A for UB bathing/dressing, mod A for toilet t/fs/ func t/fs, max A for LB dressing/clothing management. LUE Brunnstrum stage III in arm/hand. Cont to dem confusion, agitation, perseveration, will vary sesison to session.  mod A overall  functional cognition, transfer training, balance retraining, family edu, NMR, DME   Mobility   min/mod assist bed  mobility, min/mod assist sit<>stands and stand pivot transfers using RW, gait up to 191ft using RW with min assist and +2 w/c follow. increased agitation/confusion  min/mod assist overall  activity tolerance, bed mobility, transfer training, gait training, dynamic sitting and standing balance, L attention, pt/family education, discharge planning   Communication   Dysarthria, Mod A  Mod A (downgraded)  increased vocal intensity and breath support/coordination when speaking, overarticulation, family education   Safety/Cognition/ Behavioral Observations  Mod-Max A, some improvement in particiaptio this week  Min-Mod (downgraded)  attention, safety awareness, recall, basic familiar problem solving, education with family   Pain   Pain reported 3/10 during shift  (headache)  Pain <3/10  Assess every shift and as needed   Skin   Abrasions to L shoulder, and b/l knees  Prevent further breakdown and continue wound care per orders  Assess every shift and as needed     Discharge Planning:  Goal to discharge to SNF for LTC (awaiting daughter facility decision)   Team Discussion: IV ABX for bacteremia through 04/04/20. Note agitation, complaints of a headache, neck pain, perseveration and limited participation. Progress limited by pushing, required hand over hand assistance for care, frequent cues to redirect, re-orient and encourage participation with therapy. Patient is continent of bowel and bladder. Discussed discharge transportation; recommended ambulance due to limited practice with transfers, no car transfers or steps with 3 step to entry of home. Patient on target to meet rehab goals: no, currently min-mod assist for upper body care and mod for lower body care. Min-mod assist for transfers  *  See Care Plan and progress notes for long and short-term goals.   Revisions to Treatment Plan:  Goals downgraded to mod assist Change to q day therapy schedule  Teaching Needs: Transfers,  toileting,medications, etc.  Current Barriers to Discharge: Decreased caregiver support, Home enviroment access/layout and Behavior  Possible Resolutions to Barriers: Family education with daughter SNF recommended; Daughter's preferred facility does not have ready bed, continue to pursue SNF in [place]. Staff recommend ambulance transport for discharge     Medical Summary Current Status: BPs ok , finishing, abrasion on knees improving  Barriers to Discharge: Medical stability   Possible Resolutions to Barriers/Weekly Focus: d/c IV abx on 12/11, medicate for HA /neck pain using acetaminophen   Continued Need for Acute Rehabilitation Level of Care: The patient requires daily medical management by a physician with specialized training in physical medicine and rehabilitation for the following reasons: Direction of a multidisciplinary physical rehabilitation program to maximize functional independence : Yes Medical management of patient stability for increased activity during participation in an intensive rehabilitation regime.: Yes Analysis of laboratory values and/or radiology reports with any subsequent need for medication adjustment and/or medical intervention. : Yes   I attest that I was present, lead the team conference, and concur with the assessment and plan of the team.   Chana Bode B 04/02/2020, 1:21 PM

## 2020-04-02 NOTE — Progress Notes (Signed)
Patient ID: [name], male   DOB: August 11, 1940, 79 y.o.   MRN: [id number]   Patient referral sent to [facility] in [facility] [place], [place]

## 2020-04-02 NOTE — Progress Notes (Signed)
[place] PHYSICAL MEDICINE & REHABILITATION PROGRESS NOTE   Subjective/Complaints:  Discussed d/c date again, pt was reading paper and accurately discussed current events   ROS: Patient denies CP, SOB, N/V/D   Objective:   No results found. Recent Labs    03/31/20 0618  WBC 4.5  HGB 9.3*  HCT 28.7*  PLT 208   Recent Labs    03/31/20 0618  NA 140  K 3.9  CL 107  CO2 23  GLUCOSE 95  BUN 23  CREATININE 1.19  CALCIUM 8.5*    Intake/Output Summary (Last 24 hours) at 04/02/2020 0924 Last data filed at 04/02/2020 0800 Gross per 24 hour  Intake 404 ml  Output --  Net 404 ml        Physical Exam: Vital Signs Blood pressure (!) 130/52, pulse (!) 50, temperature 97.9 F (36.6 C), resp. rate 18, height '5\' 11"'  (1.803 m), weight 65.8 kg, SpO2 96 %.   General: No acute distress Mood and affect are appropriate Heart: Regular rate and rhythm no rubs murmurs or extra sounds Lungs: Clear to auscultation, breathing unlabored, no rales or wheezes Abdomen: Positive bowel sounds, soft nontender to palpation, nondistended Extremities: No clubbing, cyanosis, or edema Skin: No evidence of breakdown, no evidence of rash   Skin: No evidence of breakdown, no evidence of rash Neuro: dysphonic, fair insight and awareness Motor: Left upper extremity shoulder abduction 3-/5, distally 4/5- unchanged  Left lower extremity: 4 -to 4/5 proximal to distal  Assessment/Plan: 1. Functional deficits which require 3+ hours per day of interdisciplinary therapy in a comprehensive inpatient rehab setting.  Physiatrist is providing close team supervision and 24 hour management of active medical problems listed below.  Physiatrist and rehab team continue to assess barriers to discharge/monitor patient progress toward functional and medical goals  Care Tool:  Bathing    Body parts bathed by patient: Front perineal area, Buttocks   Body parts bathed by helper: Buttocks Body parts n/a: Right  arm, Left arm, Chest, Abdomen, Right upper leg, Left upper leg, Right lower leg, Left lower leg, Face (pt declined this session)   Bathing assist Assist Level: Moderate Assistance - Patient 50 - 74% (sit<>stand)     Upper Body Dressing/Undressing Upper body dressing   What is the patient wearing?: Pull over shirt    Upper body assist Assist Level: Moderate Assistance - Patient 50 - 74%    Lower Body Dressing/Undressing Lower body dressing      What is the patient wearing?: Pants, Incontinence brief     Lower body assist Assist for lower body dressing: Moderate Assistance - Patient 50 - 74%     Toileting Toileting    Toileting assist Assist for toileting: Supervision/Verbal cueing (for anterior peri care in sitting)     Transfers Chair/bed transfer  Transfers assist     Chair/bed transfer assist level: Minimal Assistance - Patient > 75% Chair/bed transfer assistive device: Programmer, multimedia   Ambulation assist      Assist level: Minimal Assistance - Patient > 75% Assistive device: Walker-rolling Max distance: 122f   Walk 10 feet activity   Assist     Assist level: Minimal Assistance - Patient > 75% Assistive device: Walker-rolling   Walk 50 feet activity   Assist Walk 50 feet with 2 turns activity did not occur: Safety/medical concerns (due to patient fatigue/weakness)  Assist level: Minimal Assistance - Patient > 75% Assistive device: Walker-rolling    Walk 150 feet activity  Assist Walk 150 feet activity did not occur: Safety/medical concerns (due to patient fatigue/weakness)  Assist level: Moderate Assistance - Patient - 50 - 74% Assistive device: Walker-rolling    Walk 10 feet on uneven surface  activity   Assist Walk 10 feet on uneven surfaces activity did not occur: Safety/medical concerns (due to patient fatigue/weakness)         Wheelchair     Assist Will patient use wheelchair at discharge?: Yes Type of  Wheelchair: Manual    Wheelchair assist level: Dependent - Patient 0% (TIS w/c) Max wheelchair distance: 150    Wheelchair 50 feet with 2 turns activity    Assist        Assist Level: Dependent - Patient 0%   Wheelchair 150 feet activity     Assist      Assist Level: Dependent - Patient 0%    Medical Problem List and Plan: 1. 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    Continue CIR PT, OT, SLP  15/7 therapy d/t tolerance/participation , would do well at SNF level to complete IV abx, insurance med Mudlogger indicates funding for CIR not SNF Team conference today please see physician documentation under team conference tab, met with team  to discuss problems,progress, and goals. Formulized individual treatment plan based on medical history, underlying problem and comorbidities.  Plan D/C 12/11                                                                                                          2.  Antithrombotics: -DVT/anticoagulation:  Pharmaceutical: Other (comment)--Eliquis.              -antiplatelet therapy: N/A--has been off ASA/Brilinta.  3. Chronic HA/Cervicalgia/Pain Management: Was on flexeril prn PTA. Will monitor N/A.   K pad and cervical spine XR given new neck pain and TTP on cervical spine.  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             Monitor with increased exertion  Muscle rub ordered on 11/26   12/5 encouraged him to use kpad! 4. Mood: LCSW to follow for evaluation and support.              -antipsychotic agents: Monitor for now.  5. Neuropsych: This patient is not capable of making decisions on his own behalf. 6. Skin/Wound Care: Routine pressure relief measures. Protein supplement  due to evidence of malnutrition.  7. Fluids/Electrolytes/Nutrition: Monitor I/Os.    9. HTN: Monitor BP educate on importance of compliance. SBP goal 130-150   Vitals:   04/02/20 0354 04/02/20 0901  BP: (!) 130/52   Pulse: (!) 52 (!) 50  Resp: 18   Temp: 97.9 F (36.6 C)   SpO2: 96%     controlled on 12/8 in desired range 10. ICM/Heart failure with persistent apical thrombus: Monitor for signs and symptoms of fluid  overload. Daily weights. Continue Eliquis 11. A fib: Monitor HR -continue Lanoxin and Eliquis.              Monitor with increased activity 12. Acute on chronic renal failure: Likely due to gastroenteritis.   Resolved  13. Electrolytes abnormalities: Resolved post supplementation 14. Anemia: Recheck iron levels.                    Hemoglobin 9.7 on 11/22  15. T2DM with hyperglycemia: Hgb A1C-6.8.  probable neuropathy trial gabapentin low dose   Will monitor BS ac/hs.Add CM restrictions to diet.              Monitor with increased mobility CBG (last 3)  Recent Labs    04/01/20 1705 04/01/20 2014 04/02/20 0625  GLUCAP 105* 222* 94   Controlled 12/8 16. 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 MSSA bacteremia  TEE showed no valve vegetations, no other sig structural abnl in report cardiology to f/u with recs  IV cefazolin q 8h, to D/C on 04/05/20.   19.  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 Urinary incontinence - mainly due to cognition , cont timed toileting     LOS: 28 days A FACE TO [place] E Dangelo Guzzetta 04/02/2020, 9:24 AM

## 2020-04-02 NOTE — Progress Notes (Signed)
Occupational Therapy Session Note  Patient Details  Name: NEWMAN WAREN MRN: [id number] Date of Birth: [birth year]  Today's Date: 04/02/2020 OT Individual Time: 5638-7564 OT Individual Time Calculation (min): 42 min    Short Term Goals: Week 4:  OT Short Term Goal 1 (Week 4): Continue working on established LTGs downgraded to mod assist overall.  Skilled Therapeutic Interventions/Progress Updates:    Pt up on the toilet with nursing to start session.  He was able to complete toilet hygiene and clothing management sit to stand with mod assist.  He needed mod instructional cueing to complete pulling up pants instead of trying to pick up and put on his glasses while having his pants only up half way.  He was then able to ambulate out to the wheelchair at the sink with mod assist and no device.  He then worked on washing his hands at the sink with mod assist and completed oral hygiene with setup and mod instructional cueing for thoroughness.  Once grooming tasks were completed, he was transported down to the ortho gym for working on strengthening with the LUE.  Had him work on using the BUEs with the ergonometer and with ace bandage used to help him maintain grip in the left hand secondary to decreased AROM and strength.  He was able to complete 3 mins on level 1 resistance with mod instructional cueing for maintaining sustained attention, as throughout, he would perseverate on "Let's go back to the room so I can lay down." He maintained his speed at an average of 20-25 RPMs.  He completed another set of two mins using the isolated LUE and overall mod to max assist.  Finished working on strengthening with another 2 mins using BUEs averaging 20-22 RPMs.  Returned to the room via wheelchair with pt being able to recall his room number and directions to turn when telling therapist.  He completed transfer back to the bed with mod assist from the wheelchair with use of the RW for support.  He transitioned to  supine with supervision and call button and phone in reach with safety alarm in place.     Therapy Documentation Precautions:  Precautions Precautions: Fall Precaution Comments: L hemi, L inattention Restrictions Weight Bearing Restrictions: No  Pain: Pain Assessment Pain Scale: Faces Pain Score: 0-No pain Pain Location: Head Pain Descriptors / Indicators: Aching ADL: See Care Tool Section for some details of mobility and selfcare  Therapy/Group: Individual Therapy  Brettney Ficken,[place] OTR/L 04/02/2020, 10:33 AM

## 2020-04-02 NOTE — Progress Notes (Signed)
Team Conference Report to Patient/Family  Team Conference discussion was reviewed with the patient and caregiver, including goals, any changes in plan of care and target discharge date.  Patient and caregiver express understanding and are in agreement.  The patient has a target discharge date of 04/05/20 (LTC pending).  Andria Rhein 04/02/2020, 11:22 AM

## 2020-04-02 NOTE — Progress Notes (Signed)
Speech Language Pathology Daily Session Note  Patient Details  Name: [name] MRN: [id number] Date of Birth: 26-Jan-1941  Today's Date: 04/02/2020 SLP Individual Time: 1230-1315 SLP Individual Time Calculation (min): 45 min  Short Term Goals: Week 4: SLP Short Term Goal 1 (Week 4): STG=LTG due to remaining length of stay  Skilled Therapeutic Interventions: Pt was seen for skilled ST targeting dysphagia and cognitive-linguistic goals. SLP facilitated session with an upgraded trial of dys 2 (minced/ground) solid texture lunch tray, with thin liquids. Pt did demonstrate left buccal pocketing of Dys 2 meat, requiring moderate verbal and visual cues for use of strategies to clear it. Sips of thin as liquid washes and bites of purees were most effective strategies for oral clearance of pocketing. No overt s/sx aspiration observed throughout intake. Recommend pt upgrade to Dys 2 (minced/ground) textures, continue thins and meds whole in applesauce. Pt will still require full supervision during meals to ensure use of compensatory swallow strategies. SLP also facilitated session with Mod A cueing for sustained attention to functional tasks, including self feeding and topic maintenance of conversation. Pt frequently distracted and initiating conversation regarding abstract unfamiliar topics, as is characteristic for pt in states of increased confusion. He required Moderate assistance in order to use calendar in order to orient to time and use aids to recall d/c date and problem solve time left in CIR. Despite cueing, pt still persisted that he is "leaving tomorrow." Pt left laying in bed with alarm set and needs within reach. Continue per current plan of care.        Pain Pain Assessment Pain Score: 0-No pain Faces Pain Scale: Hurts a Ivan bit Pain Type: Acute pain Pain Location: Head Pain Descriptors / Indicators: Headache Pain Onset: On-going Pain Intervention(s): RN made aware Multiple Pain  Sites: No  Therapy/Group: Individual Therapy  [name] 04/02/2020, 1:17 PM

## 2020-04-02 NOTE — Progress Notes (Signed)
Patient ID: [name], male   DOB: 11/25/1940, 79 y.o.   MRN: [id number]   Patient referral sent to [facility]  in [place], [place]

## 2020-04-02 NOTE — Progress Notes (Signed)
Physical Therapy Session Note  Patient Details  Name: [name] MRN: [id number] Date of Birth: 1941-02-14  Today's Date: 04/02/2020 PT Individual Time: 1500-1530 PT Individual Time Calculation (min): 30 min   Short Term Goals: Week 4:  PT Short Term Goal 1 (Week 4): STGs = to LTGs based on ELOS  Skilled Therapeutic Interventions/Progress Updates:    Patient received supine in bed, attempting to exit bed by himself. Agreeable to assist from PT to bathroom. He denies pain. Patient able to come to stand in [place] with MinA and B UE support on [place] handle. Stedy used to transfer into bathroom for safety. Patient attempting to have bowel movement, but unable to do so at this time. He appeared to be much more alert today speaking with PT about previous career as a Armed forces logistics/support/administrative officer and the difference between being a Electronics engineer.  Patient able to complete peri-hygiene in standing with CGA for postural support and clothing management in standing with MinA. Patient returning to bed via Stedy with MinA to return to supine. Bed alarm on, call light within reach.   Therapy Documentation Precautions:  Precautions Precautions: Fall Precaution Comments: L hemi, L inattention Restrictions Weight Bearing Restrictions: No    Therapy/Group: Individual Therapy  Elizebeth Koller, PT, DPT, CBIS  04/02/2020, 7:48 AM

## 2020-04-03 ENCOUNTER — Inpatient Hospital Stay (HOSPITAL_COMMUNITY): Payer: Medicare PPO | Admitting: Physical Therapy

## 2020-04-03 ENCOUNTER — Inpatient Hospital Stay (HOSPITAL_COMMUNITY): Payer: Medicare PPO | Admitting: Occupational Therapy

## 2020-04-03 ENCOUNTER — Inpatient Hospital Stay (HOSPITAL_COMMUNITY): Payer: Medicare PPO | Admitting: Speech Pathology

## 2020-04-03 LAB — GLUCOSE, CAPILLARY
Glucose-Capillary: 99 mg/dL (ref 70–99)
Glucose-Capillary: 124 mg/dL — ABNORMAL HIGH (ref 70–99)
Glucose-Capillary: 150 mg/dL — ABNORMAL HIGH (ref 70–99)
Glucose-Capillary: 125 mg/dL — ABNORMAL HIGH (ref 70–99)

## 2020-04-03 NOTE — Progress Notes (Signed)
Patient ID: [name], male   DOB: Jan 02, 1941, 79 y.o.   MRN: [id number]   Patient referral emailed to [facility] for review  Lavera Guise, [place] [phone]

## 2020-04-03 NOTE — Progress Notes (Signed)
Patient ID: [name], male   DOB: [birth year], 79 y.o.   MRN: [id number]   Patient referral sent to [facility] in [place], [place] [address]  Lavera Guise, [place] [phone]

## 2020-04-03 NOTE — Progress Notes (Signed)
Patient ID: [name], male   DOB: 1940/06/17, 79 y.o.   MRN: [id number]   Sw received update that patient decision of SNF auth was upheld.  [place], [place] [phone]

## 2020-04-03 NOTE — Progress Notes (Addendum)
[facility] PHYSICAL MEDICINE & REHABILITATION PROGRESS NOTE   Subjective/Complaints:  Eating well, discussed d/c with team   ROS: Patient denies CP, SOB, N/V/D   Objective:   No results found. No results for input(s): WBC, HGB, HCT, PLT in the last 72 hours. No results for input(s): NA, K, CL, CO2, GLUCOSE, BUN, CREATININE, CALCIUM in the last 72 hours.  Intake/Output Summary (Last 24 hours) at 04/03/2020 0838 Last data filed at 04/02/2020 2045 Gross per 24 hour  Intake 240 ml  Output --  Net 240 ml        Physical Exam: Vital Signs Blood pressure (!) 149/74, pulse (!) 46, temperature 97.6 F (36.4 C), temperature source Oral, resp. rate 16, height 5\' 11"  (1.803 m), weight 66.8 kg, SpO2 94 %.   General: No acute distress Mood and affect are appropriate Heart: Regular rate and rhythm no rubs murmurs or extra sounds Lungs: Clear to auscultation, breathing unlabored, no rales or wheezes Abdomen: Positive bowel sounds, soft nontender to palpation, nondistended Extremities: No clubbing, cyanosis, or edema Skin: No evidence of breakdown, no evidence of rash   Skin: No evidence of breakdown, no evidence of rash Neuro: dysphonic, fair insight and awareness Motor: Left upper extremity shoulder abduction 3-/5, distally 4/5- unchanged  Left lower extremity: 4 -to 4/5 proximal to distal  Assessment/Plan: 1. Functional deficits which require 3+ hours per day of interdisciplinary therapy in a comprehensive inpatient rehab setting.  Physiatrist is providing close team supervision and 24 hour management of active medical problems listed below.  Physiatrist and rehab team continue to assess barriers to discharge/monitor patient progress toward functional and medical goals  Care Tool:  Bathing    Body parts bathed by patient: Front perineal area,Buttocks   Body parts bathed by helper: Buttocks Body parts n/a: Right arm,Left arm,Chest,Abdomen,Right upper leg,Left upper leg,Right  lower leg,Left lower leg,Face (pt declined this session)   Bathing assist Assist Level: Moderate Assistance - Patient 50 - 74% (sit<>stand)     Upper Body Dressing/Undressing Upper body dressing   What is the patient wearing?: Pull over shirt    Upper body assist Assist Level: Moderate Assistance - Patient 50 - 74%    Lower Body Dressing/Undressing Lower body dressing      What is the patient wearing?: Pants,Incontinence brief     Lower body assist Assist for lower body dressing: Moderate Assistance - Patient 50 - 74%     Toileting Toileting    Toileting assist Assist for toileting: Moderate Assistance - Patient 50 - 74% (sit to stand)     Transfers Chair/bed transfer  Transfers assist     Chair/bed transfer assist level: Minimal Assistance - Patient > 75% Chair/bed transfer assistive device:   Ambulation assist      Assist level: Minimal Assistance - Patient > 75% Assistive device: Walker-rolling Max distance: 12ft   Walk 10 feet activity   Assist     Assist level: Minimal Assistance - Patient > 75% Assistive device: Walker-rolling   Walk 50 feet activity   Assist Walk 50 feet with 2 turns activity did not occur: Safety/medical concerns (due to patient fatigue/weakness)  Assist level: Minimal Assistance - Patient > 75% Assistive device: Walker-rolling    Walk 150 feet activity   Assist Walk 150 feet activity did not occur: Safety/medical concerns (due to patient fatigue/weakness)  Assist level: Moderate Assistance - Patient - 50 - 74% Assistive device: Walker-rolling    Walk 10 feet on uneven  surface  activity   Assist Walk 10 feet on uneven surfaces activity did not occur: Safety/medical concerns (due to patient fatigue/weakness)         Wheelchair     Assist Will patient use wheelchair at discharge?: Yes Type of Wheelchair: Manual    Wheelchair assist level: Dependent - Patient 0% (TIS  w/c) Max wheelchair distance: 150    Wheelchair 50 feet with 2 turns activity    Assist        Assist Level: Dependent - Patient 0%   Wheelchair 150 feet activity     Assist      Assist Level: Dependent - Patient 0%    Medical Problem List and Plan: 1. 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    Continue CIR PT, OT, SLP  15/7 therapy d/t tolerance/participation , would do well at SNF level to complete IV abx, insurance med director indicates funding for CIR not SNF   Plan D/C 12/11  , daughter is not able to care for him by herself, no other family, has LTC bed available in 2 wks , daughter is looking at selling pt's car to fund Short term SNF bed. Pt is unable to manage finances or return to driving due to stroke                                                                                                         2.  Antithrombotics: -DVT/anticoagulation:  Pharmaceutical: Other (comment)--Eliquis.              -antiplatelet therapy: N/A--has been off ASA/Brilinta.  3. Chronic HA/Cervicalgia/Pain Management: Was on flexeril prn PTA. Will monitor N/A.   K pad and cervical spine XR given new neck pain and TTP on cervical spine.  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             Monitor with increased exertion  Muscle rub ordered on 11/26   12/5 encouraged him to use kpad! 4. Mood: LCSW to follow for evaluation and support.              -antipsychotic agents: Monitor for now.  5. Neuropsych: This patient is not capable of making decisions on his own behalf. 6. Skin/Wound Care: Routine pressure relief measures. Protein supplement due to evidence of malnutrition.  7. Fluids/Electrolytes/Nutrition: Monitor  I/Os.    9. HTN: Monitor BP educate on importance of compliance. SBP goal 130-150   Vitals:   04/02/20 1925 04/03/20 0420  BP: (!) 141/79 (!) 149/74  Pulse: (!) 53 (!) 46  Resp: 20 16  Temp: (!) 97.5 F (36.4 C) 97.6 F (36.4 C)  SpO2: 100% 94%    controlled on 12/9 in desired range 10. ICM/Heart failure with persistent apical thrombus: Monitor for signs and symptoms of fluid overload. Daily weights. Continue Eliquis 11. A fib: Monitor HR -continue Lanoxin and Eliquis.  Monitor with increased activity 12. Acute on chronic renal failure: Likely due to gastroenteritis.   Resolved  13. Electrolytes abnormalities: Resolved post supplementation 14. Anemia: Recheck iron levels.                    Hemoglobin 9.7 on 11/22 , 9.3 on 12/6 15. T2DM with hyperglycemia: Hgb A1C-6.8.  probable neuropathy trial gabapentin low dose   Will monitor BS ac/hs.Add CM restrictions to diet.              Monitor with increased mobility CBG (last 3)  Recent Labs    04/02/20 1707 04/02/20 2123 04/03/20 0620  GLUCAP 92 147* 99   Controlled 12/8 16. 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 MSSA bacteremia  TEE showed no valve vegetations, no other sig structural abnl in report cardiology to f/u with recs  IV cefazolin q 8h, to D/C on 04/05/20.   19.  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 Urinary incontinence - mainly due to cognition , cont timed toileting     LOS: 29 days A FACE TO FACE EVALUATION WAS PERFORMED  Erick Colace 04/03/2020, 8:38 AM

## 2020-04-03 NOTE — Progress Notes (Signed)
Physical Therapy Session Note  Patient Details  Name: [name] MRN: [id number] Date of Birth: 12-28-1940  Today's Date: 04/03/2020 PT Individual Time: 1040-1105 PT Individual Time Calculation (min): 25 min   Short Term Goals: Week 4:  PT Short Term Goal 1 (Week 4): STGs = to LTGs based on ELOS  Skilled Therapeutic Interventions/Progress Updates:    Pt received supine in bed with NT present reporting pt requested for assistance to bathroom. Therapist assumed care of pt, but upon questioning pt denying need to use bathroom. With significant encouragement and redirection pt agreeable to transfer to sitting EOB and don clothing. Supine>sitting L EOB, HOB partially elevated, with min assist for B LE management and trunk upright - max assist via bed pads to scoot hips towards EOB. Sitting EOB with supervision for sitting balance due to L posterior lean while donning shirt total assist - pt states "come on, help me get it on" reluctant to encouragement to increase participation. Pt becomes agitated and states "let me lay down" spontaneously returning himself to supine with CGA for safety. Redirected pt with encouragement to don pants with pt stating "well put them on" and pt requesting to don them in supine but with max gentle, encouragement and education on importance of OOB mobility pt agreeable to transition back to sitting EOB - donned pants total assist. Sit>stand EOB>RW with min assist for lifting and balance due to L lean while pulling pants over hips total assist. With continued significant encouragement and redirection pt agreeable to gait training using RW, ~115ft, with min progressing to mod assist for balance due to worsening L lateral trunk lean and +2 assist for IV pole management and for safety - cuing throughout for increased R weight shift and L LE step length with inconsistent improvement as pt starts perseverating on returning to his room and getting back in the bed. Sit>supine with CGA  for safety. Pt positioned with HOB elevated and he hears the IV line running and pt states "that is a snake trying to get in here" and then when he looks at the TV it is the blue screen showing "no signal" to which pt states "that's a good movie" (believe that pt was being genuine and not sarcastic but unable to be certain). Pt left supine in bed with needs in reach and bed alarm on.  Therapy Documentation Precautions:  Precautions Precautions: Fall Precaution Comments: L hemi, L inattention Restrictions Weight Bearing Restrictions: No  Pain:   Continues to report some neck pain while repositioning supine in the bed - pillows adjusted for increased comfort.   Therapy/Group: Individual Therapy  Ginny Forth , PT, DPT, CSRS  04/03/2020, 9:23 PM

## 2020-04-03 NOTE — Progress Notes (Signed)
Patient ID: [name], male   DOB: [birth year], 79 y.o.   MRN: [id number]   SW informed daughter of received bed offer from [facility] in [place], [place]. Facility has availability. Daughter informed base rate for private room is $3,500 and base rate for semi is $3,000. Daughter provided with Thomasena Edis (AD) contact information if interested in touring. Patient information emailed to: [email or web address] Odea: [phone] Address: [address] [place], [place] [address]  Lavera Guise, BSW [phone]

## 2020-04-03 NOTE — Progress Notes (Signed)
Speech Language Pathology Daily Session Note  Patient Details  Name: [name] MRN: [id number] Date of Birth: 1940-12-11  Today's Date: 04/03/2020 SLP Individual Time: 2836-6294 SLP Individual Time Calculation (min): 30 min  Short Term Goals: Week 4: SLP Short Term Goal 1 (Week 4): STG=LTG due to remaining length of stay  Skilled Therapeutic Interventions: Pt was seen for skilled ST targeting cognitive skills. Upon arrival, pt was attempted to get out of bed, legs already hanging out, requesting to get to restroom. Max A multimodal cueing for recall and reducing impulsivity while waiting for stedy to arrive for safe transfer. During transfers pt required anywhere from Min-Max A verbal and visual cues for sequencing and safety awareness, as well as for sustained attention to task due to internal distractions. He did not void and brief was already soaked with urine. Clean brief donned and returned to bed via stedy. Pt did appear to be in a state of increased confusion this morning, stating that he saw people looking in his window last night (although he is on 4th floor), etc. Despite Max A verbal cues and attempts to reorient and redirect pt to functional tasks targeting attention, safety, and problem solving, he started to become verbally agitated with therapist and stated, "I don't give a damn, you don't know how to do anything" and refused to participate. SLP attempted to reorient again and left room as he requested, missing last 15 minutes of skilled ST session. Pt did communicate neck pain, which SLP made RN aware of. Pt left laying in bed with alarm set and needs within reach. Continue per current plan of care.      Pain Pain Assessment Pain Scale: Faces Faces Pain Scale: Hurts a little bit Pain Type: Acute pain Pain Location: Neck Pain Descriptors / Indicators: Discomfort Pain Onset: Gradual Pain Intervention(s): RN made aware Multiple Pain Sites: No  Therapy/Group: Individual  Therapy  [name] 04/03/2020, 7:19 AM

## 2020-04-03 NOTE — Progress Notes (Signed)
Patient ID: [name], male   DOB: 28-Feb-1941, 79 y.o.   MRN: [id number]   Patient referral sent to: [facility], [address] [facility], [employer] care, [facility] and [place].  Daughter informed that [facility] has LTC availability:   Private Room: $325 per day plus ancillaries Semi-private: $280 per day plus ancillaries 3 person room: $260 per day plus ancillaries  Documents faxed to facility.  [place], [place] [phone]

## 2020-04-03 NOTE — Progress Notes (Signed)
Occupational Therapy Session Note  Patient Details  Name: ROXANNE ORNER MRN: [id number] Date of Birth: [birth year]  Today's Date: 04/03/2020 OT Individual Time: 1500-1531 OT Individual Time Calculation (min): 31 min    Short Term Goals: Week 4:  OT Short Term Goal 1 (Week 4): Continue working on established LTGs downgraded to mod assist overall.  Skilled Therapeutic Interventions/Progress Updates:    Pt completed supine to sit EOB with min assist to start session and then transferred stand pivot with mod assist to the tilt in space wheelchair.  He was then taken down to the dayroom where he worked at the high/low table on standing balance and LUE activation.  Had him stand for brief periods of time while using the LUE to push a washcloth forward on the table to simulate waxing his car.  He needed overall mod assist to push the cloth to target with overall min guard assist for sustained standing balance.  He needed min facilitation to maintain left knee extension, as it would stay in a slight flexed position. Next, had him transition to sitting where he worked on grasping a cup with the left hand and worked on bringing it up to his mouth for simulated drinking.  He needed mod facilitation to hold the cup once positioned with mod assist to bring up to his chin.  Finished session with return to the hallway outside of his room via wheelchair with pt ambulating to his room with mod assist and no device.  He transferred to supine with min guard and was left with the call button and phone in reach and bed alarm in place.     Therapy Documentation Precautions:  Precautions Precautions: Fall Precaution Comments: L hemi, L inattention Restrictions Weight Bearing Restrictions: No  Pain: Pain Assessment Pain Scale: 0-10 Pain Score: 8  Faces Pain Scale: Hurts a little bit Pain Type: Acute pain Pain Location: Neck Pain Orientation: Posterior Pain Descriptors / Indicators: Aching Pain Frequency:  Constant Pain Onset: On-going Patients Stated Pain Goal: 4 Pain Intervention(s): Medication (See eMAR) ADL: See Care Tool Section for some details of mobility and selfcare  Therapy/Group: Individual Therapy  Jumana Paccione,Cynthia OTR/L 04/03/2020, 5:09 PM

## 2020-04-04 ENCOUNTER — Encounter (HOSPITAL_COMMUNITY): Payer: Medicare PPO | Admitting: Occupational Therapy

## 2020-04-04 ENCOUNTER — Inpatient Hospital Stay (HOSPITAL_COMMUNITY): Payer: Medicare PPO

## 2020-04-04 ENCOUNTER — Encounter (HOSPITAL_COMMUNITY): Payer: Medicare PPO | Admitting: Speech Pathology

## 2020-04-04 ENCOUNTER — Ambulatory Visit (HOSPITAL_COMMUNITY): Payer: Medicare PPO | Admitting: Physical Therapy

## 2020-04-04 LAB — GLUCOSE, CAPILLARY
Glucose-Capillary: 82 mg/dL (ref 70–99)
Glucose-Capillary: 128 mg/dL — ABNORMAL HIGH (ref 70–99)
Glucose-Capillary: 113 mg/dL — ABNORMAL HIGH (ref 70–99)
Glucose-Capillary: 153 mg/dL — ABNORMAL HIGH (ref 70–99)

## 2020-04-04 MED ORDER — METFORMIN HCL 500 MG PO TABS
250.0000 mg | ORAL_TABLET | Freq: Every day | ORAL | Status: DC
  Administered 2020-04-05 – 2020-04-08 (×4): 250 mg via ORAL
  Filled 2020-04-04 (×4): qty 1

## 2020-04-04 NOTE — Progress Notes (Signed)
[facility] PHYSICAL MEDICINE & REHABILITATION PROGRESS NOTE   Subjective/Complaints:  Appreciate SW note  ROS: Patient denies CP, SOB, N/V/D   Objective:   No results found. No results for input(s): WBC, HGB, HCT, PLT in the last 72 hours. No results for input(s): NA, K, CL, CO2, GLUCOSE, BUN, CREATININE, CALCIUM in the last 72 hours.  Intake/Output Summary (Last 24 hours) at 04/04/2020 0839 Last data filed at 04/04/2020 0254 Gross per 24 hour  Intake 580 ml  Output --  Net 580 ml        Physical Exam: Vital Signs Blood pressure (!) 148/68, pulse (!) 50, temperature (!) 97.4 F (36.3 C), resp. rate 17, height 5\' 11"  (1.803 m), weight 69.7 kg, SpO2 100 %.   General: No acute distress Mood and affect are appropriate Heart: Regular rate and rhythm no rubs murmurs or extra sounds Lungs: Clear to auscultation, breathing unlabored, no rales or wheezes Abdomen: Positive bowel sounds, soft nontender to palpation, nondistended Extremities: No clubbing, cyanosis, or edema Skin: No evidence of breakdown, no evidence of rash   Skin: No evidence of breakdown, no evidence of rash Neuro: dysphonic, fair insight and awareness Motor: Left upper extremity shoulder abduction 3-/5, distally 4/5- unchanged  Left lower extremity: 4 -to 4/5 proximal to distal  Assessment/Plan: 1. Functional deficits which require 3+ hours per day of interdisciplinary therapy in a comprehensive inpatient rehab setting.  Physiatrist is providing close team supervision and 24 hour management of active medical problems listed below.  Physiatrist and rehab team continue to assess barriers to discharge/monitor patient progress toward functional and medical goals  Care Tool:  Bathing    Body parts bathed by patient: Front perineal area,Buttocks   Body parts bathed by helper: Buttocks Body parts n/a: Right arm,Left arm,Chest,Abdomen,Right upper leg,Left upper leg,Right lower leg,Left lower leg,Face (pt  declined this session)   Bathing assist Assist Level: Moderate Assistance - Patient 50 - 74% (sit<>stand)     Upper Body Dressing/Undressing Upper body dressing   What is the patient wearing?: Pull over shirt    Upper body assist Assist Level: Moderate Assistance - Patient 50 - 74%    Lower Body Dressing/Undressing Lower body dressing      What is the patient wearing?: Pants,Incontinence brief     Lower body assist Assist for lower body dressing: Moderate Assistance - Patient 50 - 74%     Toileting Toileting    Toileting assist Assist for toileting: Moderate Assistance - Patient 50 - 74% (sit to stand)     Transfers Chair/bed transfer  Transfers assist     Chair/bed transfer assist level: Moderate Assistance - Patient 50 - 74% Chair/bed transfer assistive device:   Ambulation assist      Assist level: Moderate Assistance - Patient 50 - 74% Assistive device: Walker-rolling Max distance: 118ft   Walk 10 feet activity   Assist     Assist level: Moderate Assistance - Patient - 50 - 74% Assistive device: Walker-rolling   Walk 50 feet activity   Assist Walk 50 feet with 2 turns activity did not occur: Safety/medical concerns (due to patient fatigue/weakness)  Assist level: Moderate Assistance - Patient - 50 - 74% Assistive device: Walker-rolling    Walk 150 feet activity   Assist Walk 150 feet activity did not occur: Safety/medical concerns (due to patient fatigue/weakness)  Assist level: Moderate Assistance - Patient - 50 - 74% Assistive device: Walker-rolling    Walk 10 feet on uneven  surface  activity   Assist Walk 10 feet on uneven surfaces activity did not occur: Safety/medical concerns (due to patient fatigue/weakness)         Wheelchair     Assist Will patient use wheelchair at discharge?: Yes Type of Wheelchair: Manual    Wheelchair assist level: Dependent - Patient 0% (TIS w/c) Max  wheelchair distance: 150    Wheelchair 50 feet with 2 turns activity    Assist        Assist Level: Dependent - Patient 0%   Wheelchair 150 feet activity     Assist      Assist Level: Dependent - Patient 0%    Medical Problem List and Plan: 1. 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    Continue CIR PT, OT, SLP  15/7 therapy d/t tolerance/participation , would do well at SNF level to complete IV abx, insurance med director indicates funding for CIR not SNF   Plan D/C 12/11  , daughter is not able to care for him by herself, no other family, has LTC bed available in 2 wks , daughter is looking  Short term SNF bed.This may not not happen prior to tomorrow Pt is unable to manage finances or return to driving due to stroke                                                                                                         2.  Antithrombotics: -DVT/anticoagulation:  Pharmaceutical: Other (comment)--Eliquis.              -antiplatelet therapy: N/A--has been off ASA/Brilinta.  3. Chronic HA/Cervicalgia/Pain Management: Was on flexeril prn PTA. Will monitor N/A.   K pad and cervical spine XR given new neck pain and TTP on cervical spine.  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             Monitor with increased exertion  Muscle rub ordered on 11/26   12/5 encouraged him to use kpad! 4. Mood: LCSW to follow for evaluation and support.              -antipsychotic agents: Monitor for now.  5. Neuropsych: This patient is not capable of making decisions on his own behalf. 6. Skin/Wound Care: Routine pressure relief measures. Protein supplement due to evidence of malnutrition.  7. Fluids/Electrolytes/Nutrition: Monitor  I/Os.    9. HTN: Monitor BP educate on importance of compliance. SBP goal 130-150   Vitals:   04/03/20 2013 04/04/20 0400  BP: (!) 143/69 (!) 148/68  Pulse: (!) 52 (!) 50  Resp:    Temp: (!) 97.4 F (36.3 C) (!) 97.4 F (36.3 C)  SpO2: 99% 100%    controlled on 12/10 in desired range 10. ICM/Heart failure with persistent apical thrombus: Monitor for signs and symptoms of fluid overload. Daily weights. Continue Eliquis 11. A fib: Monitor HR -continue  Lanoxin and Eliquis.              Monitor with increased activity 12. Acute on chronic renal failure: Likely due to gastroenteritis.   Resolved  13. Electrolytes abnormalities: Resolved post supplementation 14. Anemia: Recheck iron levels.                    Hemoglobin 9.7 on 11/22 , 9.3 on 12/6 15. T2DM with hyperglycemia: Hgb A1C-6.8.  probable neuropathy trial gabapentin low dose   Will monitor BS ac/hs.Add CM restrictions to diet.              Monitor with increased mobility CBG (last 3)  Recent Labs    04/03/20 1649 04/03/20 2050 04/04/20 0612  GLUCAP 150* 125* 82   Controlled 12/10 but am CBG low will reduce glucophage to 250mg  qam, used only 2U SSI on 12/9 16. 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 MSSA bacteremia  TEE showed no valve vegetations, no other sig structural abnl in report cardiology to f/u with recs  IV cefazolin q 8h, to D/C on 04/05/20.   19.  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 Urinary incontinence - mainly due to cognition , cont timed toileting     LOS: 30 days A FACE TO FACE EVALUATION WAS PERFORMED  14/11/21 04/04/2020, 8:39 AM

## 2020-04-04 NOTE — Progress Notes (Signed)
Physical Therapy Session Note  Patient Details  Name: [name] MRN: [id number] Date of Birth: 12-Jul-1940  Today's Date: 04/04/2020 PT Missed Time: 30 Minutes Missed Time Reason: Patient unwilling to participate   Pt received L sidelying in bed, asleep with glasses on his face and newspaper lying by his head. No family present. Awakens to gentle verbal and tactile stimulus and upon encouragement to participate in OOB mobility pt says "no" adamantly and then with continued gentle encouragement pt states "no, and don't ask me those questions again." Despite attempts at encouraging pt to participate in therapy session pt continues to decline. Missed 30 minutes of skilled physical therapy.   Ginny Forth , PT, DPT, CSRS  04/04/2020, 1:21 PM

## 2020-04-04 NOTE — Progress Notes (Signed)
Occupational Therapy Weekly Progress Note  Patient Details  Name: [name] MRN: [id number] Date of Birth: 1940-10-16  Beginning of progress report period: March 28, 2020 End of progress report period: April 04, 2020  Today's Date: 04/04/2020 OT Individual Time: 3832-9191 OT Individual Time Calculation (min): 25 min    Patient has met 0 of 1 short term goals. Pt assist levels vary 2/2 pt agitation, cont to perseverate on laying back in bed or going to the bathroom. Cont to be consistently disorientated to day of the week and demonstrating confusion (e.g., reporting seeing people in the room that aren't there.) More consistently is min A for sit<>stand transfer with or without RW, stand-pivot transfer from TIS to toilet with mod to max A with occasional mod L lean. Bathes at sink with mod A overall for thoroughness + to bathe RUE. UB/LB dressing mod to max A depending on agitation. L arm cont to be Brunnstrom level II - III, L hand at level III.   Patient continues to demonstrate the following deficits: abnormal tone, decreased coordination and decreased motor planning and decreased initiation, decreased attention, decreased awareness, decreased problem solving, decreased safety awareness and decreased memory and therefore will continue to benefit from skilled OT intervention to enhance overall performance with BADL and Reduce care partner burden.  Patient not progressing toward long term goals.  See goal revision..  Plan of care revisions: Pt POC changed to QD 2/2 slower progress than anticipated and pt agitation and confusion.  OT Short Term Goals Week 5:  OT Short Term Goal 1 (Week 5): Continue working on established LTGs downgraded to mod assist overall.  Skilled Therapeutic Interventions/Progress Updates:    Pt received semi-reclined in bed with bed alarm engaged. Pt noted confusion by stating he had called 911 x2 last night and they hung up on him. Additionally stated he soiled  himself when he did not and stated the day of the week incorrectly as Monday. Agreeable to get dressed. Side roll towards L with min A to bend LLE. Max A to lift trunk in sup > sit EOB. Doffed hospital gown with min A for initiation. Donned long-sleeve shirt with max A to thread LLE + pull over head. Pt cont to be agitated, demanding to lay back down. Pt completed sit EOB > sup with min A to reposition in bed. Donned pants at bed level 2/2 pt agitation with mod A to thread over bilat feet. Sit <> stand with RW + min A to place L hand on RW. Max A to pull pants over bilat hips. Sit > sup with min A to push up in bed. Donned Teds + bilat gripper socks with total A while sup in bed. Pt assisted with pulling up bilat socks. Semi-reclined in bed, completed 15x of L elbow flex/exten to increase LUE NMR. Pt c/o of pain "everywhere" and in LUE, nurse made aware and administered pain medication. Pt left semi-reclined in bed with nurse present, call bell in reach, bed alarm engaged.   Therapy Documentation Precautions:  Precautions Precautions: Fall Precaution Comments: L hemi, L inattention Restrictions Weight Bearing Restrictions: No Pain: Pain Assessment Pain Scale: Faces Faces Pain Scale: Hurts even more Pain Type: Chronic pain ADL: See Care Tool for more details.  Therapy/Group: Individual Therapy  Volanda Napoleon See Care Tool for more details.  04/04/2020, 3:56 PM

## 2020-04-04 NOTE — Progress Notes (Signed)
Speech Language Pathology Daily Session Note  Patient Details  Name: [name] MRN: [id number] Date of Birth: [birth year]  Today's Date: 04/04/2020 SLP Individual Time: 1345-1410 SLP Individual Time Calculation (min): 25 min  Short Term Goals: Week 4: SLP Short Term Goal 1 (Week 4): STG=LTG due to remaining length of stay  Skilled Therapeutic Interventions: Pt was seen for skilled ST targeting cognitive goals. Pt reluctantly agreed to participate in a limited money management task involving cash only. He required fluctuating Min-Mod A verbal and visual cues for error awareness and self-monitoring non-productive behaviors/verbal agitation throughout session, and Max A verbal cues for recall within session. He was originally disoriented to time (by about 2 weeks), however with Min A verbal and visual cues he used calendar to orient to correct date. Memory notebook update prior to end of session. Pt left laying in bed with alarm set and needs within reach.        Pain Pain Assessment Pain Scale: Faces Faces Pain Scale: No hurt  Therapy/Group: Individual Therapy  [name] 04/04/2020, 7:25 AM

## 2020-04-05 LAB — GLUCOSE, CAPILLARY
Glucose-Capillary: 100 mg/dL — ABNORMAL HIGH (ref 70–99)
Glucose-Capillary: 138 mg/dL — ABNORMAL HIGH (ref 70–99)
Glucose-Capillary: 134 mg/dL — ABNORMAL HIGH (ref 70–99)
Glucose-Capillary: 173 mg/dL — ABNORMAL HIGH (ref 70–99)

## 2020-04-05 NOTE — Plan of Care (Signed)
  Problem: RH SKIN INTEGRITY Goal: RH STG SKIN FREE OF INFECTION/BREAKDOWN Description: Remain free of further breakdown with min assist Outcome: Progressing Goal: RH STG ABLE TO PERFORM INCISION/WOUND CARE W/ASSISTANCE Description: STG Able To Perform Incision/Wound Care With Assistance. Min assist Outcome: Progressing   Problem: RH BOWEL ELIMINATION Goal: RH STG MANAGE BOWEL WITH ASSISTANCE Description: STG Manage Bowel with Assistance. Min Outcome: Progressing

## 2020-04-05 NOTE — Progress Notes (Addendum)
[facility] PHYSICAL MEDICINE & REHABILITATION PROGRESS NOTE   Subjective/Complaints: Agitated about wanting to get up to use bathroom No specific complaints  ROS: Patient denies CP, SOB, N/V/D   Objective:   No results found. No results for input(s): WBC, HGB, HCT, PLT in the last 72 hours. No results for input(s): NA, K, CL, CO2, GLUCOSE, BUN, CREATININE, CALCIUM in the last 72 hours.  Intake/Output Summary (Last 24 hours) at 04/05/2020 1308 Last data filed at 04/05/2020 0800 Gross per 24 hour  Intake 652 ml  Output --  Net 652 ml    Physical Exam: Vital Signs Blood pressure 138/69, pulse 72, temperature (!) 97.3 F (36.3 C), resp. rate 16, height 5\' 11"  (1.803 m), weight 69.7 kg, SpO2 100 %.  Gen: no distress, normal appearing HEENT: oral mucosa pink and moist, NCAT Cardio: Reg rate Chest: normal effort, normal rate of breathing Abd: soft, non-distended Ext: no edema Skin: No evidence of breakdown, no evidence of rash Neuro: dysphonic, fair insight and awareness Motor: Left upper extremity shoulder abduction 3-/5, distally 4/5- unchanged  Left lower extremity: 4 -to 4/5 proximal to distal  Assessment/Plan: 1. Functional deficits which require 3+ hours per day of interdisciplinary therapy in a comprehensive inpatient rehab setting.  Physiatrist is providing close team supervision and 24 hour management of active medical problems listed below.  Physiatrist and rehab team continue to assess barriers to discharge/monitor patient progress toward functional and medical goals  Care Tool:  Bathing    Body parts bathed by patient: Front perineal area,Buttocks   Body parts bathed by helper: Buttocks Body parts n/a: Right arm,Left arm,Chest,Abdomen,Right upper leg,Left upper leg,Right lower leg,Left lower leg,Face (pt declined this session)   Bathing assist Assist Level: Moderate Assistance - Patient 50 - 74% (sit<>stand)     Upper Body Dressing/Undressing Upper body  dressing   What is the patient wearing?: Pull over shirt    Upper body assist Assist Level: Maximal Assistance - Patient 25 - 49%    Lower Body Dressing/Undressing Lower body dressing      What is the patient wearing?: Pants     Lower body assist Assist for lower body dressing: Moderate Assistance - Patient 50 - 74%     Toileting Toileting    Toileting assist Assist for toileting: Moderate Assistance - Patient 50 - 74% (sit to stand)     Transfers Chair/bed transfer  Transfers assist     Chair/bed transfer assist level: Moderate Assistance - Patient 50 - 74% Chair/bed transfer assistive device:   Ambulation assist      Assist level: Moderate Assistance - Patient 50 - 74% Assistive device: Walker-rolling Max distance: 115ft   Walk 10 feet activity   Assist     Assist level: Moderate Assistance - Patient - 50 - 74% Assistive device: Walker-rolling   Walk 50 feet activity   Assist Walk 50 feet with 2 turns activity did not occur: Safety/medical concerns (due to patient fatigue/weakness)  Assist level: Moderate Assistance - Patient - 50 - 74% Assistive device: Walker-rolling    Walk 150 feet activity   Assist Walk 150 feet activity did not occur: Safety/medical concerns (due to patient fatigue/weakness)  Assist level: Moderate Assistance - Patient - 50 - 74% Assistive device: Walker-rolling    Walk 10 feet on uneven surface  activity   Assist Walk 10 feet on uneven surfaces activity did not occur: Safety/medical concerns (due to patient fatigue/weakness)  Wheelchair     Assist Will patient use wheelchair at discharge?: Yes Type of Wheelchair: Manual    Wheelchair assist level: Dependent - Patient 0% (TIS w/c) Max wheelchair distance: 150    Wheelchair 50 feet with 2 turns activity    Assist        Assist Level: Dependent - Patient 0%   Wheelchair 150 feet activity      Assist      Assist Level: Dependent - Patient 0%    Medical Problem List and Plan: 1. 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    Continue CIR PT, OT, SLP  15/7 therapy d/t tolerance/participation , would do well at SNF level to complete IV abx, insurance med director indicates funding for CIR not SNF Plan D/C 12/11  , daughter is not able to care for him by herself, no other family, has LTC bed available in 2 wks , daughter is looking  Short term SNF bed.This may not not happen prior to tomorrow Pt is unable to manage finances or return to driving due to stroke                               2.  Antithrombotics: -DVT/anticoagulation:  Pharmaceutical: Other (comment)--Eliquis.              -antiplatelet therapy: N/A--has been off ASA/Brilinta.  3. Chronic HA/Cervicalgia/Pain Management: Was on flexeril prn PTA. Will monitor N/A.   K pad and cervical spine XR given new neck pain and TTP on cervical spine.  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             Monitor with increased exertion  Muscle rub ordered on 11/26   12/5 encouraged him to use kpad! 4. Mood: LCSW to follow for evaluation and support.              -antipsychotic agents: Monitor for now.  5. Neuropsych: This patient is not capable of making decisions on his own behalf. 6. Skin/Wound Care: Routine pressure relief measures. Protein supplement due to evidence of malnutrition.  7. Fluids/Electrolytes/Nutrition: Monitor I/Os.    9. HTN: Monitor BP educate on importance of compliance. SBP goal 130-150   Vitals:   04/05/20 0316 04/05/20 0809  BP: 138/69   Pulse: (!) 46 72  Resp:    Temp: (!) 97.3 F (36.3 C)   SpO2: 100%     controlled on 12/11-  continue to monitor TID 10. ICM/Heart failure with persistent apical thrombus: Monitor for signs and symptoms of fluid overload. Daily weights. Continue Eliquis 11. A fib: Monitor HR -continue Lanoxin and Eliquis.   12/11: bradycardic to 46: consider cardiology consult outpatient regarding whether digoxin should be stopped.              Monitor with increased activity 12. Acute on chronic renal failure: Likely due to gastroenteritis.   Resolved  13. Electrolytes abnormalities: Resolved post supplementation 14. Anemia: Recheck iron levels.                    Hemoglobin 9.7 on 11/22 , 9.3 on 12/6 15. T2DM with hyperglycemia: Hgb A1C-6.8.  probable neuropathy trial gabapentin low dose   Will monitor BS ac/hs.Add CM restrictions to diet.  Monitor with increased mobility CBG (last 3)  Recent Labs    04/04/20 2112 04/05/20 0546 04/05/20 1137  GLUCAP 153* 100* 138*   Controlled 12/10 but am CBG low will reduce glucophage to 250mg  qam, used only 2U SSI on 12/9  12/11: CBGs range from 100-153: continue to monitor AC/HS.  16. 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 MSSA bacteremia  TEE showed no valve vegetations, no other sig structural abnl in report cardiology to f/u with recs  IV cefazolin q 8h, to D/C on 04/05/20.   19.  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 Urinary incontinence - mainly due to cognition , cont timed toileting     LOS: 31 days A FACE TO FACE EVALUATION WAS PERFORMED  Jeran Hiltz P Deretha Ertle 04/05/2020, 1:08 PM

## 2020-04-06 LAB — GLUCOSE, CAPILLARY
Glucose-Capillary: 92 mg/dL (ref 70–99)
Glucose-Capillary: 111 mg/dL — ABNORMAL HIGH (ref 70–99)
Glucose-Capillary: 106 mg/dL — ABNORMAL HIGH (ref 70–99)
Glucose-Capillary: 145 mg/dL — ABNORMAL HIGH (ref 70–99)

## 2020-04-06 NOTE — Progress Notes (Signed)
[place] PHYSICAL MEDICINE & REHABILITATION PROGRESS NOTE   Subjective/Complaints: No complaints Agitated Diastolic soft Bradycardic  ROS: Patient denies CP, SOB, N/V/D   Objective:   No results found. No results for input(s): WBC, HGB, HCT, PLT in the last 72 hours. No results for input(s): NA, K, CL, CO2, GLUCOSE, BUN, CREATININE, CALCIUM in the last 72 hours.  Intake/Output Summary (Last 24 hours) at 04/06/2020 1551 Last data filed at 04/06/2020 1300 Gross per 24 hour  Intake 320 ml  Output --  Net 320 ml    Physical Exam: Vital Signs Blood pressure (!) 124/55, pulse (!) 50, temperature 97.8 F (36.6 C), resp. rate 17, height 5\' 11"  (1.803 m), weight 69.7 kg, SpO2 99 %.  Gen: no distress, normal appearing HEENT: oral mucosa pink and moist, NCAT Cardio: Reg rate Chest: normal effort, normal rate of breathing Abd: soft, non-distended Ext: no edema Skin: No evidence of breakdown, no evidence of rash Neuro: dysphonic, fair insight and awareness Motor: Left upper extremity shoulder abduction 3-/5, distally 4/5- unchanged  Left lower extremity: 4 -to 4/5 proximal to distal  Assessment/Plan: 1. Functional deficits which require 3+ hours per day of interdisciplinary therapy in a comprehensive inpatient rehab setting.  Physiatrist is providing close team supervision and 24 hour management of active medical problems listed below.  Physiatrist and rehab team continue to assess barriers to discharge/monitor patient progress toward functional and medical goals  Care Tool:  Bathing    Body parts bathed by patient: Front perineal area,Buttocks   Body parts bathed by helper: Buttocks Body parts n/a: Right arm,Left arm,Chest,Abdomen,Right upper leg,Left upper leg,Right lower leg,Left lower leg,Face (pt declined this session)   Bathing assist Assist Level: Moderate Assistance - Patient 50 - 74% (sit<>stand)     Upper Body Dressing/Undressing Upper body dressing   What  is the patient wearing?: Pull over shirt    Upper body assist Assist Level: Maximal Assistance - Patient 25 - 49%    Lower Body Dressing/Undressing Lower body dressing      What is the patient wearing?: Pants     Lower body assist Assist for lower body dressing: Moderate Assistance - Patient 50 - 74%     Toileting Toileting    Toileting assist Assist for toileting: Moderate Assistance - Patient 50 - 74% (sit to stand)     Transfers Chair/bed transfer  Transfers assist     Chair/bed transfer assist level: Moderate Assistance - Patient 50 - 74% Chair/bed transfer assistive device:   Ambulation assist      Assist level: Moderate Assistance - Patient 50 - 74% Assistive device: Walker-rolling Max distance: 126ft   Walk 10 feet activity   Assist     Assist level: Moderate Assistance - Patient - 50 - 74% Assistive device: Walker-rolling   Walk 50 feet activity   Assist Walk 50 feet with 2 turns activity did not occur: Safety/medical concerns (due to patient fatigue/weakness)  Assist level: Moderate Assistance - Patient - 50 - 74% Assistive device: Walker-rolling    Walk 150 feet activity   Assist Walk 150 feet activity did not occur: Safety/medical concerns (due to patient fatigue/weakness)  Assist level: Moderate Assistance - Patient - 50 - 74% Assistive device: Walker-rolling    Walk 10 feet on uneven surface  activity   Assist Walk 10 feet on uneven surfaces activity did not occur: Safety/medical concerns (due to patient fatigue/weakness)         Wheelchair  Assist Will patient use wheelchair at discharge?: Yes Type of Wheelchair: Manual    Wheelchair assist level: Dependent - Patient 0% (TIS w/c) Max wheelchair distance: 150    Wheelchair 50 feet with 2 turns activity    Assist        Assist Level: Dependent - Patient 0%   Wheelchair 150 feet activity     Assist      Assist  Level: Dependent - Patient 0%    Medical Problem List and Plan: 1. 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    Continue CIR PT, OT, SLP  15/7 therapy d/t tolerance/participation , would do well at SNF level to complete IV abx, insurance med director indicates funding for CIR not SNF Plan D/C 12/11  , daughter is not able to care for him by herself, no other family, has LTC bed available in 2 wks , daughter is looking  Short term SNF bed.This may not not happen prior to tomorrow Pt is unable to manage finances or return to driving due to stroke                               2.  Antithrombotics: -DVT/anticoagulation:  Pharmaceutical: Other (comment)--Eliquis.              -antiplatelet therapy: N/A--has been off ASA/Brilinta.  3. Chronic HA/Cervicalgia/Pain Management: Was on flexeril prn PTA. Will monitor N/A.   K pad and cervical spine XR given new neck pain and TTP on cervical spine.  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             Monitor with increased exertion  Muscle rub ordered on 11/26   12/5 encouraged him to use kpad! 4. Mood: LCSW to follow for evaluation and support.              -antipsychotic agents: Monitor for now.  5. Neuropsych: This patient is not capable of making decisions on his own behalf. 6. Skin/Wound Care: Routine pressure relief measures. Protein supplement due to evidence of malnutrition.  7. Fluids/Electrolytes/Nutrition: Monitor I/Os.    9. HTN: Monitor BP educate on importance of compliance. SBP goal 130-150   Vitals:   04/06/20 0925 04/06/20 1541  BP:  (!) 124/55  Pulse: (!) 54 (!) 50  Resp:  17  Temp:  97.8 F (36.6 C)  SpO2: 100% 99%   12/12: BP soft off antihypertensives. See #11.   10. ICM/Heart failure with persistent apical thrombus: Monitor for signs and symptoms of fluid overload. Daily weights. Continue Eliquis 11. A fib: Monitor HR -continue Lanoxin and Eliquis.   12/11: bradycardic to 46: consider cardiology consult outpatient regarding whether digoxin should be stopped.   12/12: continues to be bradycardic but not as low, 50-54, continue to monitor.              Monitor with increased activity 12. Acute on chronic renal failure: Likely due to gastroenteritis.   Resolved  13. Electrolytes abnormalities: Resolved post supplementation 14. Anemia: Recheck iron levels.                    Hemoglobin 9.7 on 11/22 , 9.3 on 12/6 15. T2DM with hyperglycemia: Hgb A1C-6.8.  probable neuropathy trial gabapentin low dose  Will monitor BS ac/hs.Add CM restrictions to diet.              Monitor with increased mobility CBG (last 3)  Recent Labs    04/05/20 2112 04/06/20 0600 04/06/20 1201  GLUCAP 173* 92 111*   Controlled 12/10 but am CBG low will reduce glucophage to 250mg  qam, used only 2U SSI on 12/9  12/12: CBGs range from 92-173: continue to monitor AC/HS.  16. 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 MSSA bacteremia  TEE showed no valve vegetations, no other sig structural abnl in report cardiology to f/u with recs  IV cefazolin q 8h, to D/C on 04/05/20.   19.  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 Urinary incontinence - mainly due to cognition , cont timed toileting     LOS: 32 days A FACE TO FACE EVALUATION WAS PERFORMED  14/11/21 [name] 04/06/2020, 3:51 PM

## 2020-04-07 ENCOUNTER — Inpatient Hospital Stay (HOSPITAL_COMMUNITY): Payer: Medicare PPO | Admitting: Speech Pathology

## 2020-04-07 ENCOUNTER — Inpatient Hospital Stay (HOSPITAL_COMMUNITY): Payer: Medicare PPO

## 2020-04-07 ENCOUNTER — Inpatient Hospital Stay (HOSPITAL_COMMUNITY): Payer: Medicare PPO | Admitting: Occupational Therapy

## 2020-04-07 LAB — CBC
HCT: 30.3 % — ABNORMAL LOW (ref 39.0–52.0)
Hemoglobin: 10.7 g/dL — ABNORMAL LOW (ref 13.0–17.0)
MCH: 32.6 pg (ref 26.0–34.0)
MCHC: 35.3 g/dL (ref 30.0–36.0)
MCV: 92.4 fL (ref 80.0–100.0)
Platelets: 233 10*3/uL (ref 150–400)
RBC: 3.28 MIL/uL — ABNORMAL LOW (ref 4.22–5.81)
RDW: 12.2 % (ref 11.5–15.5)
WBC: 5.3 10*3/uL (ref 4.0–10.5)
nRBC: 0 % (ref 0.0–0.2)

## 2020-04-07 LAB — BASIC METABOLIC PANEL
Anion gap: 11 (ref 5–15)
BUN: 33 mg/dL — ABNORMAL HIGH (ref 8–23)
CO2: 23 mmol/L (ref 22–32)
Calcium: 9 mg/dL (ref 8.9–10.3)
Chloride: 105 mmol/L (ref 98–111)
Creatinine, Ser: 1.25 mg/dL — ABNORMAL HIGH (ref 0.61–1.24)
GFR, Estimated: 59 mL/min — ABNORMAL LOW (ref >60)
Glucose, Bld: 97 mg/dL (ref 70–99)
Potassium: 4.4 mmol/L (ref 3.5–5.1)
Sodium: 139 mmol/L (ref 135–145)

## 2020-04-07 LAB — GLUCOSE, CAPILLARY
Glucose-Capillary: 88 mg/dL (ref 70–99)
Glucose-Capillary: 124 mg/dL — ABNORMAL HIGH (ref 70–99)
Glucose-Capillary: 152 mg/dL — ABNORMAL HIGH (ref 70–99)
Glucose-Capillary: 136 mg/dL — ABNORMAL HIGH (ref 70–99)

## 2020-04-07 NOTE — Progress Notes (Signed)
Patient ID: [name], male   DOB: [birth year], 79 y.o.   MRN: [id number]   Patient referral sent to [facility] for review

## 2020-04-07 NOTE — Progress Notes (Signed)
Speech Language Pathology Weekly Progress and Session Note  Patient Details  Name: [name] MRN: [id number] Date of Birth: 1940-12-08  Beginning of progress report period: March 31, 2020 End of progress report period: April 07, 2020  Today's Date: 04/07/2020 SLP Individual Time: 1300-1340 SLP Individual Time Calculation (min): 40 min  Short Term Goals: Week 4: SLP Short Term Goal 1 (Week 4): STG=LTG due to remaining length of stay SLP Short Term Goal 1 - Progress (Week 4): Progressing toward goal    New Short Term Goals: Week 5: SLP Short Term Goal 1 (Week 5): STG=LTG due to remaining length of stay; pt currently pending LTC placement  Weekly Progress Updates: Pt is still making very slow and inconsistent progress toward his long term goals; minimal progress/change in his speech or cognition has been made since last reporting period. Due to intense level of physical and cognitive assistance needed by pt for his greatest safety, thearpy recommendations to his daughter (caregiver) have been SNF vs LTC placement. She has decided to purse LTC for pt, and he is currently pending placement. Pt is currently ~Mod assist for basic familiar tasks due to cognitive impairments impacting his basic problem solving, attention, frustration tolerance, short term memory, and awareness. He has also demonstrated fluctuations in verbal agitation and willingness to participate in therapies over the least 1-2 weeks, further limiting his potential for progress. Pt was recently advanced to a dysphagia 2 (minced/ground) texture diet with thin liquids, and Min-Mod A verbal cues is required for clearance of left buccal pocketing of solids. Pt also has mild dysarthria and requires Min-Mod A verbal cues for use of overarticulation and increased vocal intensity to increase his speech intelligibility at the phrase/sentence level. Pt education has been ongoing, however no family has been present for ST sessions to date.  Pt would continue to benefit from skilled ST while inpatient in order to maximize functional independence and reduce burden of care prior to discharge. Pt will need strict 24/7 supervision and hands on care for his greatest safety and functioning at discharge, in addition to ST follow up at next level of care.      Intensity: Minumum of 1-2 x/day, 30 to 90 minutes Frequency: 3 to 5 out of 7 days Duration/Length of Stay: Pending LTC placement Treatment/Interventions: Cognitive remediation/compensation;Cueing hierarchy;Functional tasks;Patient/family education;Therapeutic Activities;Speech/Language facilitation;Dysphagia/aspiration precaution training;Internal/external aids   Daily Session  Skilled Therapeutic Interventions: Pt was seen for skilled ST targeting dysphagia and cognitive goals. Pt was finishing his lunch with NT upon therapist's arrival. SLP took over full supervision and Min A verbal cues provided for use of compensatory strategy to clear left buccal pocketing of Dys 2 vegetables from him tray.  No overt s/sx aspiration observed with Dys 1 solids, Dys 2 solids, or thin liquids. Recommend continue current diet with full supervision. Min A verbal and tactile cues were required for functional problem solving given upper extremity limitations during self feeding. Pt required min A verbal and visual cues to reorient to correct date today. Overall Max A required for recall of daily activities and use of memory notebook as compensatory strategy. Pt requested to use restroom, therefore, SLP assisted pt with transfer to restroom via stedy. He required mod A verbal and visual cueing for sequencing, reducing impulsivity, and safety awareness during transfer. He did not void. Once back to bed, he requested assistance putting on shoes. Therefore, Min A cues provided for problem solving how to donn his shoes correctly. When asked why he wanted  shoes on in bed, he stated, "so I can get up in awhile". SLP  reinforced that pt must have staff present to ambulate in room for his safety, which pt acknowledged. He requested to use restroom again (5 minutes after returning to bed), therefore Total A for recall of attempt to toilet 5 minutes prior provided. Pt left sitting in bed with alarm set, NT and RN present and made aware pt is still requesting to go to restroom again. Continue per current plan of care.        Pain Pain Assessment Pain Scale: Faces  Faces Pain Scale: No hurt   Therapy/Group: Individual Therapy  [name] 04/07/2020, 12:27 PM

## 2020-04-07 NOTE — Progress Notes (Signed)
Physical Therapy Weekly Progress Note  Patient Details  Name: [name] MRN: [id number] Date of Birth: 12-Nov-1940  Beginning of progress report period: March 27, 2020 End of progress report period: April 07, 2020  Patient with anticipated D/C over this past weekend, but was unable due to no LTC bed available at facility, resulting in longer than expected LOS, hence longer than expected reporting period.   Patient has met 0 of 1 short term goals as no STGs were set based on prior ELOS and discharge plan. Patient continues to demonstrate fluctuating levels of participation in therapy sessions, perseverating on returning to bed, resulting in slow progress. He continues to demonstrate confusion and impaired orientation. He is performing supine<>sit with CGA/min assist, sit<>stand and stand pivot transfers using RW with min/mod assist, and is ambulating up to 125f using RW with min/mod assist. Pt continues to demonstrate impaired midline orientation with L lateral lean when standing and ambulating resulting in poor L LE foot clearance and step length during gait training.   Patient continues to demonstrate the following deficits muscle weakness, muscle joint tightness and muscle paralysis, decreased cardiorespiratoy endurance, impaired timing and sequencing, abnormal tone, unbalanced muscle activation, motor apraxia, decreased coordination and decreased motor planning, decreased midline orientation and decreased attention to left, decreased initiation, decreased attention, decreased awareness, decreased problem solving, decreased safety awareness, decreased memory and delayed processing and decreased sitting balance, decreased standing balance, decreased postural control, hemiplegia and decreased balance strategies and therefore will continue to benefit from skilled PT intervention to increase functional independence with mobility and decrease burden of care.  Patient not progressing toward long  term goals.  See goal revision..  Plan of care revisions: Pt POC changed to QD due to slower than anticipated progress and inconsistent pt participation in therapy..  PT Short Term Goals Week 4:  PT Short Term Goal 1 (Week 4): STGs = to LTGs based on ELOS PT Short Term Goal 1 - Progress (Week 4): Progressing toward goal Week 5:  PT Short Term Goal 1 (Week 5): STG= LTG based on ELOS  Skilled Therapeutic Interventions/Progress Updates:  Ambulation/gait training;Cognitive remediation/compensation;DME/adaptive equipment instruction;Discharge planning;Functional mobility training;Pain management;Psychosocial support;Splinting/orthotics;Therapeutic Activities;UE/LE Strength taining/ROM;Visual/perceptual remediation/compensation;Balance/vestibular training;Community reintegration;Disease management/prevention;Functional electrical stimulation;Neuromuscular re-education;Patient/family education;Skin care/wound management;Stair training;Therapeutic Exercise;UE/LE Coordination activities;Wheelchair propulsion/positioning   Therapy Documentation Precautions:  Precautions Precautions: Fall Precaution Comments: L hemi, L inattention Restrictions Weight Bearing Restrictions: No  JNicole Cella PT, DPT, CSRS 04/07/2020, 7:50 AM

## 2020-04-07 NOTE — Progress Notes (Signed)
Occupational Therapy Session Note  Patient Details  Name: [name] MRN: [id number] Date of Birth: 02/10/1941  Today's Date: 04/07/2020 OT Individual Time: 1033-1100 OT Individual Time Calculation (min): 27 min    Short Term Goals: Week 5:  OT Short Term Goal 1 (Week 5): Continue working on established LTGs downgraded to mod assist overall.  Skilled Therapeutic Interventions/Progress Updates:    Pt received semi-reclined in bed. Pt disorientated to situation and place, stated he was in "[place]" and that he "didn't care" to know why he was in the hospital. Consistently agitated throughout session, requesting to go back to bed, but agreeable to get dressed and go to the bathroom. Side roll > L with min A, max A for sup > sit EOB to lift trunk and place BLE off bed.  Req overall mod A to max A to maintain midline orientation during dressing 2/2 significant L lean. Doffed hospital gown with min A to remove LUE. Donned long-sleeved shirt with mod A to fully thread BUE . Donned pants with max A to thread BLE and to pull over hips. Max A for sit<>stand transfers 2/2 L lean and pt agitation. Amb to bathroom without RW with mod A.  Did not void, req max A for LB clothing management. Toilet transfer out of the bathroom using the RW with mod A for L hand placement on RW orthotic. Amb to sink to wash hands with RW + mod A for balance.  Pt pushing the walker away from him instead of staying inside of it when washing hands.  Washed hands with min A for balance + VCs for BLE placement. Returned to bed with supervision from sit EOB > sup. C/o of headache, RN made aware and provided pain medication during session. Left pt semi-reclined in bed with LUE propped up by pillow, call bell in reach, bed alarm engaged.   Therapy Documentation Precautions:  Precautions Precautions: Fall Precaution Comments: L hemi, L inattention Restrictions Weight Bearing Restrictions: No  Pain: C/o of head pain, did not quanitfy,  RN made aware and provided meds in session.  ADL: See Care Tool for more details.   Therapy/Group: Individual Therapy  Ivan Revering, MS, OTR/L Perrin Maltese OTR/L  04/07/2020, 12:24 PM

## 2020-04-07 NOTE — Progress Notes (Addendum)
Patient ID: LAMONTAE RICARDO, male   DOB: 1940/12/05, 79 y.o.   MRN: [id number]   SW received follow up from [facility]. Automotive engineer following up with two of their buildings in [place] to check LTC availability.    [phone]: Ellamae Sia   Lavera Guise, [place] [phone]

## 2020-04-07 NOTE — Progress Notes (Signed)
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  Ongoing  GOAL:   Patient will meet greater than or equal to 90% of their needs  Progressing  MONITOR:   PO intake,Supplement acceptance,Diet advancement,Labs,Weight trends,Skin  REASON FOR ASSESSMENT:   Consult Diet education  ASSESSMENT:   79 year old male with PMH of T2DM, HTN, CAD s/p CABG in 2020, ICM, cardioembolic CVA, COPD who was admitted on 02/28/20 after being found on the floor with left hemiparesis, facial droop, and multiple abrasions. 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 Hospital course further complicated by post-stroke dysphagia. Pt started on a dysphagia 1 diet with nectar-thick liquids. Admitted to CIR on 11/10.  Pt currently on a dysphagia 2 diet with thin liquids. Meal completions have been 80-100% (averaging 96%)  Spoke with pt at bedside. He confirms that he is eating well. He is requesting to use the bathroom. NT notified. Will continue with current oral nutrition supplement regimen at this time.  CIR admit weight: 65.4 kg Current weight: 69.9 kg  Medications  reviewed and include: ProSource Plus BID, Ensure Enlive TID, SSI, metformin, Juven, vitamin C 250 mg daily  Labs reviewed: BUN 33, creatinine 1.25 CBG's: 88-145 x 24 hours  Diet Order:   Diet Order            DIET DYS 2 Room service appropriate? Yes; Fluid consistency: Thin  Diet effective now                 EDUCATION NEEDS:   Not appropriate for education at this time  Skin:  Skin Assessment: Skin Integrity Issues: Other: non-pressure wound to left shoulder, right knee  Last BM:  04/06/20  Height:   Ht Readings from Last 1 Encounters:  03/10/20 5\' 11"  (1.803 m)    Weight:   Wt Readings from Last 1 Encounters:  04/07/20 69.9 kg    BMI:  Body mass index is 21.49 kg/m.  Estimated Nutritional Needs:   Kcal:  1800-2000  Protein:  85-100 grams  Fluid:  1.8-2.0 L    04/09/20, MS, RD, LDN Inpatient Clinical Dietitian Please see AMiON for contact information.

## 2020-04-07 NOTE — Progress Notes (Signed)
Physical Therapy Session Note  Patient Details  Name: [name] MRN: [id number] Date of Birth: 12/30/1940  Today's Date: 04/07/2020 PT Individual Time: 9476-5465 PT Individual Time Calculation (min): 42 min   Short Term Goals: Week 4:  PT Short Term Goal 1 (Week 4): STGs = to LTGs based on ELOS  Skilled Therapeutic Interventions/Progress Updates:   Patient received supine in bed, agreeable to PT. He reports pain in his head and throat, he was unable to provide numerical rating, but requested pain rx. PT asked RN if pain rx was available and rn stated that patient had just received rx. PT able to redirect patient from reports of pain to participate in therapy. He was able to come to sit edge of bed with MinA and transfer to wc via stand pivot with ModA. PT propelling patient in wc to therapy gym for time management and energy conservation. He was able to ambulate x61ft, x67ft with MinA and RW with modified handgrip for L UE. Patient impulsively attempting to sit throughout gait tx, but was redirectable. Minimal L lateral lean noted, which is an improvement. Patient transferring to toilet with MinA and use of grab bars. Clothing management completed in standing with MinA. He was unable to void or have a bowel movement. Patient transferring back to bed via stand pivot with ModA. Bed alarm on, call light within reach.    Therapy Documentation Precautions:  Precautions Precautions: Fall Precaution Comments: L hemi, L inattention Restrictions Weight Bearing Restrictions: No    Therapy/Group: Individual Therapy  Elizebeth Koller, PT, DPT, CBIS  04/07/2020, 7:47 AM

## 2020-04-07 NOTE — Progress Notes (Signed)
[place] PHYSICAL MEDICINE & REHABILITATION PROGRESS NOTE   Subjective/Complaints: Discussed football,but pt did not recall [employer] game yesterday Sleepy this am , no agitation   ROS: Patient denies CP, SOB, N/V/D   Objective:   No results found. Recent Labs    04/07/20 0549  WBC 5.3  HGB 10.7*  HCT 30.3*  PLT 233   Recent Labs    04/07/20 0549  NA 139  K 4.4  CL 105  CO2 23  GLUCOSE 97  BUN 33*  CREATININE 1.25*  CALCIUM 9.0    Intake/Output Summary (Last 24 hours) at 04/07/2020 0824 Last data filed at 04/06/2020 1839 Gross per 24 hour  Intake 460 ml  Output --  Net 460 ml    Physical Exam: Vital Signs Blood pressure (!) 153/66, pulse (!) 56, temperature 97.9 F (36.6 C), temperature source Oral, resp. rate 16, height 5\' 11"  (1.803 m), weight 69.9 kg, SpO2 100 %.   General: No acute distress Mood and affect are appropriate Heart: Regular rate and rhythm no rubs murmurs or extra sounds Lungs: Clear to auscultation, breathing unlabored, no rales or wheezes Abdomen: Positive bowel sounds, soft nontender to palpation, nondistended Extremities: No clubbing, cyanosis, or edema Skin: No evidence of breakdown, no evidence of rash  Motor: Left upper extremity shoulder abduction 3-/5, distally 4/5- unchanged  Left lower extremity: 4 -to 4/5 proximal to distal  Assessment/Plan: 1. Functional deficits which require 3+ hours per day of interdisciplinary therapy in a comprehensive inpatient rehab setting.  Physiatrist is providing close team supervision and 24 hour management of active medical problems listed below.  Physiatrist and rehab team continue to assess barriers to discharge/monitor patient progress toward functional and medical goals  Care Tool:  Bathing    Body parts bathed by patient: Front perineal area,Buttocks   Body parts bathed by helper: Buttocks Body parts n/a: Right arm,Left arm,Chest,Abdomen,Right upper leg,Left upper leg,Right lower  leg,Left lower leg,Face (pt declined this session)   Bathing assist Assist Level: Moderate Assistance - Patient 50 - 74% (sit<>stand)     Upper Body Dressing/Undressing Upper body dressing   What is the patient wearing?: Pull over shirt    Upper body assist Assist Level: Maximal Assistance - Patient 25 - 49%    Lower Body Dressing/Undressing Lower body dressing      What is the patient wearing?: Pants     Lower body assist Assist for lower body dressing: Moderate Assistance - Patient 50 - 74%     Toileting Toileting    Toileting assist Assist for toileting: Moderate Assistance - Patient 50 - 74% (sit to stand)     Transfers Chair/bed transfer  Transfers assist     Chair/bed transfer assist level: Moderate Assistance - Patient 50 - 74% Chair/bed transfer assistive device:   Ambulation assist      Assist level: Moderate Assistance - Patient 50 - 74% Assistive device: Walker-rolling Max distance: 1100ft   Walk 10 feet activity   Assist     Assist level: Moderate Assistance - Patient - 50 - 74% Assistive device: Walker-rolling   Walk 50 feet activity   Assist Walk 50 feet with 2 turns activity did not occur: Safety/medical concerns (due to patient fatigue/weakness)  Assist level: Moderate Assistance - Patient - 50 - 74% Assistive device: Walker-rolling    Walk 150 feet activity   Assist Walk 150 feet activity did not occur: Safety/medical concerns (due to patient fatigue/weakness)  Assist level: Moderate Assistance -  Patient - 50 - 74% Assistive device: Walker-rolling    Walk 10 feet on uneven surface  activity   Assist Walk 10 feet on uneven surfaces activity did not occur: Safety/medical concerns (due to patient fatigue/weakness)         Wheelchair     Assist Will patient use wheelchair at discharge?: Yes Type of Wheelchair: Manual    Wheelchair assist level: Dependent - Patient 0% (TIS  w/c) Max wheelchair distance: 150    Wheelchair 50 feet with 2 turns activity    Assist        Assist Level: Dependent - Patient 0%   Wheelchair 150 feet activity     Assist      Assist Level: Dependent - Patient 0%    Medical Problem List and Plan: 1. 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    Continue CIR PT, OT, SLP  15/7 therapy d/t tolerance/participation , would do well at SNF level to complete IV abx, insurance med director indicates funding for CIR not SNF Plan D/C 12/11  , daughter is not able to care for him by herself, no other family, has LTC bed available in 2 wks , daughter is looking  Short term SNF bed.This may not not happen prior to tomorrow Pt is unable to manage finances or return to driving due to stroke                               2.  Antithrombotics: -DVT/anticoagulation:  Pharmaceutical: Other (comment)--Eliquis.              -antiplatelet therapy: N/A--has been off ASA/Brilinta.  3. Chronic HA/Cervicalgia/Pain Management: Was on flexeril prn PTA. Will monitor N/A.   K pad and cervical spine XR given new neck pain and TTP on cervical spine.  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             Monitor with increased exertion  Muscle rub ordered on 11/26   12/5 encouraged him to use kpad! 4. Mood: LCSW to follow for evaluation and support.              -antipsychotic agents: Monitor for now.  5. Neuropsych: This patient is not capable of making decisions on his own behalf. 6. Skin/Wound Care: Routine pressure relief measures. Protein supplement due to evidence of malnutrition.  7. Fluids/Electrolytes/Nutrition: Monitor I/Os.    9. HTN: Monitor BP educate on importance of compliance.  SBP goal 130-150   Vitals:   04/06/20 1934 04/07/20 0240  BP: 104/67 (!) 153/66  Pulse: 99 (!) 56  Resp: 16 16  Temp: 98.3 F (36.8 C) 97.9 F (36.6 C)  SpO2: 100% 100%  some lability but generally in desired range  10. ICM/Heart failure with persistent apical thrombus: Monitor for signs and symptoms of fluid overload. Daily weights. Continue Eliquis 11. A fib: Monitor HR -continue Lanoxin and Eliquis.   HR 56-99 bpm 12. Acute on chronic renal failure: Likely due to gastroenteritis.   Resolved  13. Electrolytes abnormalities: Resolved post supplementation 14. Anemia: Recheck iron levels.                    Hemoglobin 9.7 on 11/22 , 9.3 on 12/6 15. T2DM with hyperglycemia:  Hgb A1C-6.8.  probable neuropathy trial gabapentin low dose   Will monitor BS ac/hs.Add CM restrictions to diet.              Monitor with increased mobility CBG (last 3)  Recent Labs    04/06/20 1652 04/06/20 2044 04/07/20 0623  GLUCAP 106* 145* 88   Controlled 12/10 but am CBG low will reduce glucophage to 250mg  qam, used only 2U SSI on 12/9 Good control 12/13 monitor for am dip    16. 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 MSSA bacteremia  TEE showed no valve vegetations, no other sig structural abnl in report cardiology to f/u with recs  afeb off IV cefazolin x 48H , WBCs normal  19.  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 Urinary incontinence - mainly due to cognition , cont timed toileting     LOS: 33 days A FACE TO FACE EVALUATION WAS PERFORMED  14/11 04/07/2020, 8:24 AM

## 2020-04-08 ENCOUNTER — Inpatient Hospital Stay (HOSPITAL_COMMUNITY): Payer: Medicare PPO | Admitting: Occupational Therapy

## 2020-04-08 ENCOUNTER — Inpatient Hospital Stay (HOSPITAL_COMMUNITY): Payer: Medicare PPO | Admitting: Physical Therapy

## 2020-04-08 ENCOUNTER — Inpatient Hospital Stay (HOSPITAL_COMMUNITY): Payer: Medicare PPO | Admitting: Speech Pathology

## 2020-04-08 LAB — GLUCOSE, CAPILLARY
Glucose-Capillary: 98 mg/dL (ref 70–99)
Glucose-Capillary: 118 mg/dL — ABNORMAL HIGH (ref 70–99)
Glucose-Capillary: 135 mg/dL — ABNORMAL HIGH (ref 70–99)
Glucose-Capillary: 86 mg/dL (ref 70–99)

## 2020-04-08 MED ORDER — CLONAZEPAM 0.25 MG PO TBDP
0.2500 mg | ORAL_TABLET | Freq: Every day | ORAL | Status: DC | PRN
  Administered 2020-04-08 – 2020-04-10 (×3): 0.25 mg via ORAL
  Filled 2020-04-08 (×3): qty 1

## 2020-04-08 NOTE — Progress Notes (Signed)
Occupational Therapy Session Note  Patient Details  Name: [name] MRN: [id number] Date of Birth: 1940-08-07  Today's Date: 04/08/2020 OT Individual Time: 9323-5573 OT Individual Time Calculation (min): 32 min    Short Term Goals: Week 5:  OT Short Term Goal 1 (Week 5): Continue working on established LTGs downgraded to mod assist overall.  Skilled Therapeutic Interventions/Progress Updates:     Patient in bed, alert.  He is quite demanding and impatient today stating "put my pants on!"  "get me out of here!", poor reasoning but not physically aggressive.  Brief was soaked and he needed encouragement to take it off, wash and place clean brief at bed level with min A and cues for rolling and dependent to place brief.  Max A for donning pants, dependent for teds and sneakers.  Supine to sitting edge of bed with min A.  SPT to w/c mod A.  He requested to use the bathroom, mod A SPT to toilet where he had small continent BM.  He requested stedy for hygiene and return to w/c - he was able to stand from toilet in stedy with min A, he was able to wipe but needed max A for clothing management.  SPT w/c to bed with mod A.  Sit to supine min A.  He remained in bed at close of session, bed alarm set and call bell in hand.    Therapy Documentation Precautions:  Precautions Precautions: Fall Precaution Comments: L hemi, L inattention Restrictions Weight Bearing Restrictions: No  Therapy/Group: Individual Therapy  Barrie Lyme 04/08/2020, 9:05 AM

## 2020-04-08 NOTE — Progress Notes (Signed)
[place] PHYSICAL MEDICINE & REHABILITATION PROGRESS NOTE   Subjective/Complaints: Very sleepy this AM as well. Arousable and makes eye contact SLP Denny Peon trying to arouse him more.  IV in place  ROS: Patient denies CP, SOB, N/V/D   Objective:   No results found. Recent Labs    04/07/20 0549  WBC 5.3  HGB 10.7*  HCT 30.3*  PLT 233   Recent Labs    04/07/20 0549  NA 139  K 4.4  CL 105  CO2 23  GLUCOSE 97  BUN 33*  CREATININE 1.25*  CALCIUM 9.0    Intake/Output Summary (Last 24 hours) at 04/08/2020 0923 Last data filed at 04/08/2020 0700 Gross per 24 hour  Intake 420 ml  Output --  Net 420 ml    Physical Exam: Vital Signs Blood pressure 123/60, pulse (!) 55, temperature (!) 97.4 F (36.3 C), resp. rate 16, height 5\' 11"  (1.803 m), weight 69.9 kg, SpO2 100 %.   Gen: no distress, normal appearing HEENT: oral mucosa pink and moist, NCAT Cardio: Bradycardic Chest: normal effort, normal rate of breathing Abd: soft, non-distended Ext: no edema Skin: intact  Motor: Left upper extremity shoulder abduction 3-/5, distally 4/5- unchanged  Left lower extremity: 4 -to 4/5 proximal to distal  Assessment/Plan: 1. Functional deficits which require 3+ hours per day of interdisciplinary therapy in a comprehensive inpatient rehab setting.  Physiatrist is providing close team supervision and 24 hour management of active medical problems listed below.  Physiatrist and rehab team continue to assess barriers to discharge/monitor patient progress toward functional and medical goals  Care Tool:  Bathing    Body parts bathed by patient: Front perineal area,Buttocks   Body parts bathed by helper: Buttocks Body parts n/a: Right arm,Left arm,Chest,Abdomen,Right upper leg,Left upper leg,Right lower leg,Left lower leg,Face (pt declined this session)   Bathing assist Assist Level: Moderate Assistance - Patient 50 - 74% (sit<>stand)     Upper Body Dressing/Undressing Upper  body dressing   What is the patient wearing?: Pull over shirt    Upper body assist Assist Level: Moderate Assistance - Patient 50 - 74%    Lower Body Dressing/Undressing Lower body dressing      What is the patient wearing?: Pants     Lower body assist Assist for lower body dressing: Maximal Assistance - Patient 25 - 49%     Toileting Toileting    Toileting assist Assist for toileting: Maximal Assistance - Patient 25 - 49% (sit<>stand, clothing management)     Transfers Chair/bed transfer  Transfers assist     Chair/bed transfer assist level: Moderate Assistance - Patient 50 - 74% Chair/bed transfer assistive device: assist      Assist level: Minimal Assistance - Patient > 75% Assistive device: Walker-rolling Max distance: 25   Walk 10 feet activity   Assist     Assist level: Minimal Assistance - Patient > 75% Assistive device: Walker-rolling   Walk 50 feet activity   Assist Walk 50 feet with 2 turns activity did not occur: Safety/medical concerns (due to patient fatigue/weakness)  Assist level: Moderate Assistance - Patient - 50 - 74% Assistive device: Walker-rolling    Walk 150 feet activity   Assist Walk 150 feet activity did not occur: Safety/medical concerns (due to patient fatigue/weakness)  Assist level: Moderate Assistance - Patient - 50 - 74% Assistive device: Walker-rolling    Walk 10 feet on uneven surface  activity   Assist Walk 10  feet on uneven surfaces activity did not occur: Safety/medical concerns (due to patient fatigue/weakness)         Wheelchair     Assist Will patient use wheelchair at discharge?: Yes Type of Wheelchair: Manual    Wheelchair assist level: Dependent - Patient 0% (TIS w/c) Max wheelchair distance: 150    Wheelchair 50 feet with 2 turns activity    Assist        Assist Level: Dependent - Patient 0%   Wheelchair 150 feet activity      Assist      Assist Level: Dependent - Patient 0%    Medical Problem List and Plan: 1. 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    Continue CIR PT, OT, SLP  15/7 therapy d/t tolerance/participation , would do well at SNF level to complete IV abx, insurance med director indicates funding for CIR not SNF Plan D/C 12/11  , daughter is not able to care for him by herself, no other family, has LTC bed available in 2 wks , daughter is looking  Short term SNF bed.This may not not happen prior to tomorrow Pt is unable to manage finances or return to driving due to stroke                               2.  Antithrombotics: -DVT/anticoagulation:  Pharmaceutical: Other (comment)--Eliquis.              -antiplatelet therapy: N/A--has been off ASA/Brilinta.  3. Chronic HA/Cervicalgia/Pain Management: Was on flexeril prn PTA. Will monitor N/A.   K pad and cervical spine XR given new neck pain and TTP on cervical spine.  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             Monitor with increased exertion  Muscle rub ordered on 11/26   12/5 encouraged him to use kpad! 4. Mood: LCSW to follow for evaluation and support.              -antipsychotic agents: Monitor for now.   -very somnolent this morning- medications reviewed. Received klonopin and trazodone last night. Will decrease klonopin to 0.25mg  daily prn to avoid oversedation.  5. Neuropsych: This patient is not capable of making decisions on his own behalf. 6. Skin/Wound Care: Routine pressure relief measures. Protein supplement due to evidence of malnutrition.  7. Fluids/Electrolytes/Nutrition: Monitor I/Os.   9. HTN: Monitor BP educate on importance of compliance. SBP goal  130-150   Vitals:   04/08/20 0622 04/08/20 0914  BP: 123/60   Pulse: (!) 53 (!) 55  Resp: 16   Temp: (!) 97.4 F (36.3 C)   SpO2: 100%   BP below goal and bradycardic- not on any anti-hypertensive agents.  10. ICM/Heart failure with persistent apical thrombus: Monitor for signs and symptoms of fluid overload. Daily weights. Continue Eliquis 11. A fib: Monitor HR -continue Lanoxin and Eliquis.   HR 50s on 12/14- continue to monitor.  12. Acute on chronic renal failure: Likely due to gastroenteritis.   Resolved  13. Electrolytes abnormalities: Resolved post supplementation 14. Anemia: Recheck iron levels.                    Hemoglobin 9.7 on 11/22 , 9.3 on  12/6 15. T2DM with hyperglycemia: Hgb A1C-6.8.  probable neuropathy trial gabapentin low dose   Will monitor BS ac/hs.Add CM restrictions to diet.              Monitor with increased mobility CBG (last 3)  Recent Labs    04/07/20 1625 04/07/20 2111 04/08/20 0616  GLUCAP 152* 136* 98   Controlled 12/10 but am CBG low will reduce glucophage to 250mg  qam, used only 2U SSI on 12/9 Good control 12/14 monitor for am dip    16. 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 MSSA bacteremia  TEE showed no valve vegetations, no other sig structural abnl in report cardiology to f/u with recs  afeb off IV cefazolin x 48H , WBCs normal  19.  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 Urinary incontinence - mainly due to cognition , cont timed toileting     LOS: 34 days A FACE TO FACE EVALUATION WAS PERFORMED  14/11 P Aralyn Nowak 04/08/2020, 9:23 AM

## 2020-04-08 NOTE — Progress Notes (Signed)
Patient ID: [name], male   DOB: Apr 17, 1941, 79 y.o.   MRN: [id number]   Daughter updated SW that she would have potential transfer date for patient by end of day tomorrow.   [place], [place] [phone]

## 2020-04-08 NOTE — Progress Notes (Signed)
Speech Language Pathology Daily Session Note  Patient Details  Name: [name] MRN: [id number] Date of Birth: 08-22-1940  Today's Date: 04/08/2020 SLP Individual Time: 3358-2518 SLP Individual Time Calculation (min): 15 min  Short Term Goals: Week 5: SLP Short Term Goal 1 (Week 5): STG=LTG due to remaining length of stay; pt currently pending LTC placement  Skilled Therapeutic Interventions: Pt was seen for skilled ST targeting arousal and speech intelligibility. Pt was very lethargic upon arrival, requiring Moderate multimodal stimulation and cueing to arouse to assist SLP with repositioning him in bed. SLP facilitated functional communication exchanges with MD and therapist with Max faded to Mod A verbal cues for use of increased vocal intensity to increase speech intelligibility at the sentence level to 80-85%. He discussed current events from the news with therapist in a general sense, but accurately. Pt eventually ceased conversation and unable to keep eyes open for remainder of session, therefore he missed last 15 mins skilled ST session. Pt left laying in bed with alarm set and needs within reach. Continue per current plan of care.      Pain Pain Assessment Pain Scale: 0-10 Pain Score: 0-No pain   Therapy/Group: Individual Therapy  [name] 04/08/2020, 7:23 AM

## 2020-04-08 NOTE — Progress Notes (Signed)
Physical Therapy Session Note  Patient Details  Name: [name] MRN: [id number] Date of Birth: 20-Apr-1941  Today's Date: 04/08/2020 PT Individual Time: 1350-1420 PT Individual Time Calculation (min): 30 min   and  Today's Date: 04/08/2020 PT Missed Time: 15 Minutes Missed Time Reason: Patient fatigue;Patient unwilling to participate  Short Term Goals: Week 5:  PT Short Term Goal 1 (Week 5): STG= LTG based on ELOS  Skilled Therapeutic Interventions/Progress Updates:    Pt received L sidelying in bed, lying on his L arm without being aware of it - therapist directed pt's attention to this and assisted with repositioning L UE. Pt states "help me up" and appears agreeable to therapy session. Sidelying>sitting L EOB, HOB slightly elevated, with min assist for trunk upright and cuing for sequencing. Pt reports need to use bathroom. Sit>stand EOB>RW with assist for L LE repositioning due to excessive external rotation, min assist for lifting into stand via L hip/knee extension facilitation, and initially min/mod assist for balance due to L lean - requires total assist for L hand placement on/off RW orthotic during transfer. With encouragement, pt agreeable to ambulate into bathroom - gait training ~62ft using RW with varying min assist for balance due to L lean and decreased L LE step length. Standing with L UE support on RW and CGA/min assist for balance performed LB clothing management without assist. Sit<>stand to/from toilet using RW and R UE support on grab bar with CGA/min assist - pt attempted to void bowls but unable - seated peri-care without assist. Gait ~32ft back to room using RW, min assist continued due to L lean - with significant encouragement pt agreeable to wash hands demonstrating significant safety awareness impairments with pt impulsively pushing RW off to side with L hand still attached while attempting to walk up to sink - therapist unable to safely place AD back in front of pt  therefore doffed L hand from RW orthotic and provided mod assist to finish stepping towards sink. Standing with min assist for balance due to L lean completed hand hygiene mod assist for L UE incorporation into task. Stepped backwards ~48ft to sit in TIS w/c mod assist due to L posterior lean. With encouragement, pt agreeable to leave room but then becomes perseverative on finding newspaper. Gait training ~30t using RW with varying levels of min assist due to L lateral trunk lean resulting in decreased L LE foot clearance in order to get to nurses station and grab newspaper, cuing for improved posture and increased L LE step length - pt requesting to return to room and return to bed, with significant encouragement and redirection agreeable to ambulate ~156ft back to room, continued assist, impairments, and cuing as just stated. Sit>supine with CGA for safety. Pt declines getting OOB to continue participating in session reporting "my legs are too tired." Pt with decreased agitation today though still with decreased participation and understanding of importance of therapy. Pt left supine in bed with needs in reach and bed alarm on. Missed 15 minutes of skilled physical therapy.  Therapy Documentation Precautions:  Precautions Precautions: Fall Precaution Comments: L hemi, L inattention Restrictions Weight Bearing Restrictions: No  Pain: No reports of pain throughout session.   Therapy/Group: Individual Therapy  Ginny Forth , PT, DPT, CSRS  04/08/2020, 1:02 PM

## 2020-04-09 ENCOUNTER — Inpatient Hospital Stay (HOSPITAL_COMMUNITY): Payer: Medicare PPO

## 2020-04-09 ENCOUNTER — Inpatient Hospital Stay (HOSPITAL_COMMUNITY): Payer: Medicare PPO | Admitting: Speech Pathology

## 2020-04-09 LAB — GLUCOSE, CAPILLARY
Glucose-Capillary: 125 mg/dL — ABNORMAL HIGH (ref 70–99)
Glucose-Capillary: 162 mg/dL — ABNORMAL HIGH (ref 70–99)
Glucose-Capillary: 139 mg/dL — ABNORMAL HIGH (ref 70–99)

## 2020-04-09 NOTE — Progress Notes (Signed)
Physical Therapy Session Note  Patient Details  Name: [name] MRN: [id number] Date of Birth: 10/04/1940  Today's Date: 04/09/2020 PT Individual Time: 1300-1315 PT Individual Time Calculation (min): 15 min  and Today's Date: 04/09/2020 PT Missed Time: 15 Minutes Missed Time Reason: Patient unwilling to participate  Short Term Goals: Week 5:  PT Short Term Goal 1 (Week 5): STG= LTG based on ELOS  Skilled Therapeutic Interventions/Progress Updates:    Patient received supine in bed, visitor at bedside. He denies pain. PT attempting to encourage patient to participate in therapy to practice car transfer for dc planning. Patient refusing to participate in therapy at this time. PT attempting to reorient patient and explain purpose of car transfer practice as patient was repeatedly stating "I've been getting in and out of cars by myself for 80 years, I can do it myself, I don't need to practice." Patient then explaining to PT what to do if one is driving and a policecar comes up behind him. PT attempting to explain to patient that he will not be driving at dc, but it is good to know that info. Patient then explaining to PT how to change a car tire. He reports that he will be driving to [facility] when he dcs from CIR. PT unable to fully reorient patient to time, place, dc disposition and purpose of participating in therapy. Patient remaining in bed, bed alarm on, call light within reach.   Therapy Documentation Precautions:  Precautions Precautions: Fall Precaution Comments: L hemi, L inattention Restrictions Weight Bearing Restrictions: No    Therapy/Group: Individual Therapy  Elizebeth Koller, PT, DPT, CBIS  04/09/2020, 7:54 AM

## 2020-04-09 NOTE — Progress Notes (Signed)
[facility] PHYSICAL MEDICINE & REHABILITATION PROGRESS NOTE   Subjective/Complaints:   Pt awakens to voice, discussed removing IV and dc metformin.  Remains dysarthric with decreased level of arousal   ROS: Patient denies CP, SOB, N/V/D   Objective:   No results found. Recent Labs    04/07/20 0549  WBC 5.3  HGB 10.7*  HCT 30.3*  PLT 233   Recent Labs    04/07/20 0549  NA 139  K 4.4  CL 105  CO2 23  GLUCOSE 97  BUN 33*  CREATININE 1.25*  CALCIUM 9.0    Intake/Output Summary (Last 24 hours) at 04/09/2020 0854 Last data filed at 04/08/2020 1251 Gross per 24 hour  Intake 90 ml  Output -  Net 90 ml    Physical Exam: Vital Signs Blood pressure (!) 132/58, pulse (!) 54, temperature 98.2 F (36.8 C), temperature source Oral, resp. rate 18, height '5\' 11"'  (1.803 m), weight 69.9 kg, SpO2 100 %.    General: No acute distress Mood and affect are appropriate Heart: Regular rate and rhythm no rubs murmurs or extra sounds Lungs: Clear to auscultation, breathing unlabored, no rales or wheezes Abdomen: Positive bowel sounds, soft nontender to palpation, nondistended Extremities: No clubbing, cyanosis, or edema Skin: No evidence of breakdown, no evidence of rash    Motor: Left upper extremity shoulder abduction 3-/5, distally 4/5- unchanged  Left lower extremity: 4 -to 4/5 proximal to distal  Assessment/Plan: 1. Functional deficits which require 3+ hours per day of interdisciplinary therapy in a comprehensive inpatient rehab setting.  Physiatrist is providing close team supervision and 24 hour management of active medical problems listed below.  Physiatrist and rehab team continue to assess barriers to discharge/monitor patient progress toward functional and medical goals  Care Tool:  Bathing    Body parts bathed by patient: Front perineal area,Buttocks   Body parts bathed by helper: Buttocks Body parts n/a: Right arm,Left arm,Chest,Abdomen,Right upper leg,Left  upper leg,Right lower leg,Left lower leg,Face (pt declined this session)   Bathing assist Assist Level: Moderate Assistance - Patient 50 - 74% (sit<>stand)     Upper Body Dressing/Undressing Upper body dressing   What is the patient wearing?: Pull over shirt    Upper body assist Assist Level: Moderate Assistance - Patient 50 - 74%    Lower Body Dressing/Undressing Lower body dressing      What is the patient wearing?: Pants     Lower body assist Assist for lower body dressing: Maximal Assistance - Patient 25 - 49%     Toileting Toileting    Toileting assist Assist for toileting: Maximal Assistance - Patient 25 - 49% (sit<>stand, clothing management)     Transfers Chair/bed transfer  Transfers assist     Chair/bed transfer assist level: Moderate Assistance - Patient 50 - 74% Chair/bed transfer assistive device: Museum/gallery exhibitions officer assist      Assist level: Minimal Assistance - Patient > 75% Assistive device: Walker-rolling Max distance: 130f   Walk 10 feet activity   Assist     Assist level: Minimal Assistance - Patient > 75% Assistive device: Walker-rolling   Walk 50 feet activity   Assist Walk 50 feet with 2 turns activity did not occur: Safety/medical concerns (due to patient fatigue/weakness)  Assist level: Minimal Assistance - Patient > 75% Assistive device: Walker-rolling    Walk 150 feet activity   Assist Walk 150 feet activity did not occur: Safety/medical concerns (due to patient fatigue/weakness)  Assist  level: Moderate Assistance - Patient - 50 - 74% Assistive device: Walker-rolling    Walk 10 feet on uneven surface  activity   Assist Walk 10 feet on uneven surfaces activity did not occur: Safety/medical concerns (due to patient fatigue/weakness)         Wheelchair     Assist Will patient use wheelchair at discharge?: Yes Type of Wheelchair: Manual    Wheelchair assist level: Dependent -  Patient 0% (TIS w/c) Max wheelchair distance: 150    Wheelchair 50 feet with 2 turns activity    Assist        Assist Level: Dependent - Patient 0%   Wheelchair 150 feet activity     Assist      Assist Level: Dependent - Patient 0%    Medical Problem List and Plan: 1. 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    Continue CIR PT, OT, SLP Team conference today please see physician documentation under team conference tab, met with team  to discuss problems,progress, and goals. Formulized individual treatment plan based on medical history, underlying problem and comorbidities.   Qd therapy d/t tolerance/participation , would do well at SNF level to complete IV abx, Tax inspector indicates funding for CIR not SNF  , daughter is not able to care for him by herself, no other family, has LTC bed available in 2 wks , daughter is looking  Short term SNF bed.This may not not happen prior to tomorrow Pt is unable to manage finances or return to driving due to stroke                               2.  Antithrombotics: -DVT/anticoagulation:  Pharmaceutical: Other (comment)--Eliquis.              -antiplatelet therapy: N/A--has been off ASA/Brilinta.  3. Chronic HA/Cervicalgia/Pain Management: Was on flexeril prn PTA. Will monitor N/A.   K pad and cervical spine XR given new neck pain and TTP on cervical spine.  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             Monitor with increased exertion  Muscle rub ordered on 11/26   12/5 encouraged him to use kpad! 4. Mood: LCSW to follow for evaluation and support.              -antipsychotic agents: Monitor for now.   -very somnolent this morning- medications reviewed.  Received klonopin and trazodone last night. Will decrease klonopin to 0.2m daily prn to avoid oversedation.  5. Neuropsych: This patient is not capable of making decisions on his own behalf. 6. Skin/Wound Care: Routine pressure relief measures. Protein supplement due to evidence of malnutrition.  7. Fluids/Electrolytes/Nutrition: Monitor I/Os.   9. HTN: Monitor BP educate on importance of compliance. SBP goal 130-150   Vitals:   04/08/20 1442 04/08/20 2005  BP: (!) 117/48 (!) 132/58  Pulse: (!) 52 (!) 54  Resp: 17 18  Temp: 98.1 F (36.7 C) 98.2 F (36.8 C)  SpO2: 97% 100%  BP below goal and bradycardic- not on any anti-hypertensive agents.  10. ICM/Heart failure with persistent apical thrombus: Monitor for signs and symptoms of fluid overload. Daily weights. Continue Eliquis 11. A fib: Monitor HR -continue Lanoxin and  Eliquis.   HR 50s on dig  12. Acute on chronic renal failure: Likely due to gastroenteritis.   Resolved - DC IV, creat should improve off metformin 13. Electrolytes abnormalities: Resolved post supplementation 14. Anemia: Recheck iron levels.                    Hemoglobin 9.7 on 11/22 , 9.3 on 12/6 15. T2DM with hyperglycemia: Hgb A1C-6.8.  probable neuropathy trial gabapentin low dose   Will monitor BS ac/hs.Add CM restrictions to diet.              Monitor with increased mobility CBG (last 3)  Recent Labs    04/08/20 1110 04/08/20 1614 04/08/20 2119  GLUCAP 118* 135* 86  may d/c metformin 16. 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 MSSA bacteremia  TEE showed no valve vegetations, no other sig structural abnl in report cardiology to f/u with recs  afeb off IV cefazolin  , WBCs normal  19.  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 Urinary incontinence - mainly  due to cognition , cont timed toileting     LOS: 35 days A FACE TO [facility] 04/09/2020, 8:54 AM

## 2020-04-09 NOTE — Patient Care Conference (Addendum)
Inpatient RehabilitationTeam Conference and Plan of Care Update Date: 04/09/2020   Time: 10:48 AM    Patient Name: [name]      Medical Record Number: [id number]  Date of Birth: July 29, 1940 Sex: Male         Room/Bed: 4W08C/4W08C-01 Payor Info: Payor: [employer] MEDICARE / Plan: [employer] MEDICARE CHOICE PPO / Product Type: *No Product type* /    Admit Date/Time:  03/05/2020  3:24 PM  Primary Diagnosis:  Acute right MCA stroke [facility])  Hospital Problems: Principal Problem:   Acute right MCA stroke (HCC) Active Problems:   Type II diabetes mellitus (HCC)   MSSA bacteremia   Protein-calorie malnutrition, severe   Neck pain   Bacteremia   Dysphagia, post-stroke   Cognitive deficit, post-stroke    Expected Discharge Date: Expected Discharge Date:  (SNF pending)  Team Members Present: Physician leading conference: Dr. Claudette Laws Care Coodinator Present: Chana Bode, RN, BSN, CRRN;Christina Vita Barley, BSW Nurse Present: Magdalen Spatz) [place], LPN PT Present: Merry Lofty, PT OT Present: Perrin Maltese, OT SLP Present: Suzzette Righter, CF-SLP PPS Coordinator present : Fae Pippin, [place], PT     Current Status/Progress Goal Weekly Team Focus  Bowel/Bladder             Swallow/Nutrition/ Hydration   Dys 2 textures (upgraded), thin liquids, left buccal pocketing Min-Mod A  Min A  carryover swallow strategies, tolerance upgraded diet   ADL's   Min assist for UB bathing with mod to max for UB dressing.  Mod assist for LB bathing with mod to max for dressing. Inconsistent active participation so level vary with his ability to actively participate.  Mod assist to complete toilet transfers with mod to max for clothing and hygiene sit to stand.  Limited participation at this time, continues to want to be in bed and demands to go back to bed before sessions are over.  Still Brunnstrum stage III in the left arm and hand.         Mobility   pt continues to present with  varying levels of participation in therapy resulting in varying levels of assistance to complete mobility tasks; continues to perseverate on returning to bed; CGA/min assist bed mobility, min/mod assist sit<>stand and stand pivot transfers using RW, min/mod assist gait up to 153ft using RW  min/mod assist overall  activity tolerance, bed mobility, transfer training, gait training, dynamic sitting and standing balance, pt/family education, discharge planning   Communication   Dysarthria, Mod A  Mod A  increased vocal intensity and breath support/coordination when speaking, overarticulation   Safety/Cognition/ Behavioral Observations  Mod A  Min-Mod (downgraded)  attention, safety awareness, recall, basic familiar problem solving   Pain             Skin               Discharge Planning:  Discharging to SNF (waiting on bed offer for LTC)   Team Discussion: Fluctuation in participation and confusion, agitation, poor motivation, poor awareness of deficits, etc have contributed to little progress.  Patient on target to meet rehab goals: no, currently mod-max assist for transfers and mod-max assist for SLP with cues for safety and min-mod goals set.  *See Care Plan and progress notes for long and short-term goals.   Revisions to Treatment Plan:  Encourage participation/out of bed activities Practice car transfers Teaching Needs: Transfers, toileting, medications, etc.  Current Barriers to Discharge: Decreased caregiver support, Insurance for SNF coverage and Behavior ;  Possible Resolutions to Barriers: SNF recommended Family education    Medical Summary Current Status: remains afebrile off IVF, BPs stable  Barriers to Discharge: Insurance for SNF coverage   Possible Resolutions to [employer] Focus: pt is [place] and is recieving reduced therapies   Continued Need for Acute Rehabilitation Level of Care: The patient requires daily medical management by a  physician with specialized training in physical medicine and rehabilitation for the following reasons: Direction of a multidisciplinary physical rehabilitation program to maximize functional independence : Yes Medical management of patient stability for increased activity during participation in an intensive rehabilitation regime.: Yes Analysis of laboratory values and/or radiology reports with any subsequent need for medication adjustment and/or medical intervention. : Yes   I attest that I was present, lead the team conference, and concur with the assessment and plan of the team.   Chana Bode B 04/09/2020, 1:25 PM

## 2020-04-09 NOTE — Progress Notes (Signed)
Speech Language Pathology Daily Session Note  Patient Details  Name: [name] MRN: [id number] Date of Birth: 1940/08/06  Today's Date: 04/09/2020 SLP Individual Time: 7282-0601 SLP Individual Time Calculation (min): 27 min  Short Term Goals: Week 5: SLP Short Term Goal 1 (Week 5): STG=LTG due to remaining length of stay; pt currently pending LTC placement  Skilled Therapeutic Interventions: Pt was seen for skilled ST targeting cognitive-linguistic goals. Pt was pleasantly confused today (no agitation noted). Interventions focused mainly on attention, orientation, memory, and intellectual awareness via verbal tasks. Pt noted to confabulate during session, suspect due to confusion (ex: talked about someone coming in and giving him a map so he could drive to [employer], [place] tomorrow, etc.). SLP provided feedback and reorientation cues. Max A required for recall of daily information with a 5 minute delay. He was able to use calendar to reorient to time with Min A verbal/visual cueing. Mod A verbal cues required to maintain attention and functional topics of conversation for 3-5 minute intervals. In conversation regarding discharge, pt required Max A verbal cueing for intellectual awareness of deficits to assist with reasoning for why he is not safe to return to his home alone in  [place], [place]. Pt left laying in bed with alarm set and needs within reach. Continue per current plan of care.          Pain Pain Assessment Pain Scale: 0-10 Pain Score: 0-No pain   Therapy/Group: Individual Therapy  [name] 04/09/2020, 7:24 AM

## 2020-04-09 NOTE — Progress Notes (Signed)
Occupational Therapy Session Note  Patient Details  Name: [name] MRN: [id number] Date of Birth: [birth year]  Today's Date: 04/09/2020 OT Missed Time: 30 Minutes Missed Time Reason: Patient unwilling/refused to participate without medical reason  Pt supine in bed, eyes closed. Pt opened his eyes briefly to look at OT and then closed them, adamantly declining any participation in any OT. 30 min missed.   Therapy/Group: Individual Therapy  Crissie Reese 04/09/2020, 6:54 AM

## 2020-04-09 NOTE — Progress Notes (Signed)
Speech Language Pathology Daily Session Note  Patient Details  Name: [name] MRN: [id number] Date of Birth: Mar 22, 1941  Today's Date: 04/10/2020 SLP Individual Time: 2725-3664 SLP Individual Time Calculation (min): 26 min  Short Term Goals: Week 5: SLP Short Term Goal 1 (Week 5): STG=LTG due to remaining length of stay; pt currently pending LTC placement  Skilled Therapeutic Interventions: Pt was seen for skilled ST targeting cognitive-linguistic goals. He was verbally agitated today, and increasingly so throughout session. Moderate verbal cues were required for sustained attention to functional and verbal tasks in 1-3 minute intervals. Mod A also required for problem solving use of call bell to request medication from RN for headache pain. Max A multimodal cueing required for recall and/or use of any strategies to compensate for short term memory deficits during session. Pt left laying in bed with alarm set and need within reach. Continue per current plan of care.      Pain Pain Assessment Pain Scale: 0-10 Pain Score: 0-No pain  Therapy/Group: Individual Therapy  [name] 04/10/2020, 12:17 PM

## 2020-04-09 NOTE — Progress Notes (Signed)
Patient ID: [name], male   DOB: 1940/05/02, 79 y.o.   MRN: [id number] Team Conference Report to Patient/Family  Team Conference discussion was reviewed with the patient and caregiver, including goals, any changes in plan of care and target discharge date.  Patient and caregiver express understanding and are in agreement.  The patient has a target discharge date of  (SNF pending).  Andria Rhein 04/09/2020, 1:19 PM

## 2020-04-10 ENCOUNTER — Inpatient Hospital Stay (HOSPITAL_COMMUNITY): Payer: Medicare PPO | Admitting: Physical Therapy

## 2020-04-10 ENCOUNTER — Inpatient Hospital Stay (HOSPITAL_COMMUNITY): Payer: Medicare PPO | Admitting: Speech Pathology

## 2020-04-10 ENCOUNTER — Inpatient Hospital Stay (HOSPITAL_COMMUNITY): Payer: Medicare PPO | Admitting: Occupational Therapy

## 2020-04-10 LAB — GLUCOSE, CAPILLARY
Glucose-Capillary: 124 mg/dL — ABNORMAL HIGH (ref 70–99)
Glucose-Capillary: 72 mg/dL (ref 70–99)
Glucose-Capillary: 180 mg/dL — ABNORMAL HIGH (ref 70–99)
Glucose-Capillary: 119 mg/dL — ABNORMAL HIGH (ref 70–99)

## 2020-04-10 MED ORDER — DIGOXIN 125 MCG PO TABS
0.0625 mg | ORAL_TABLET | Freq: Every day | ORAL | Status: DC
  Administered 2020-04-13 – 2020-04-14 (×2): 0.0625 mg via ORAL
  Filled 2020-04-10 (×4): qty 1

## 2020-04-10 NOTE — Progress Notes (Signed)
Occupational Therapy Session Note  Patient Details  Name: [name] MRN: [id number] Date of Birth: [birth year]  Today's Date: 04/10/2020 OT Individual Time: 6599-3570 OT Individual Time Calculation (min): 27 min    Short Term Goals: Week 5:  OT Short Term Goal 1 (Week 5): Continue working on established LTGs downgraded to mod assist overall.  Skilled Therapeutic Interventions/Progress Updates:    Pt received semi-reclined in bed with bed alarm engaged. Orientated to city but not hospital name or situation. Side roll > L with min A to bend BLE + R hand placement. Side lying > sitting EOB with max A to bring BLE off bed + lift trunk. Stand-pivot transfer with min A for balance + maintain upright posture. Transported in TIS to bathroom. Stand-pivot to toilet with min A for balance, total A to doff brief. Did not void. Stand-pivot with min A for balance, mod A to don brief over L hip. Pt requesting to get back to bed repeatedly but agreeable to grooming/dressing. At sink, washed hands in TIS with close supervision + mod VCs for thoroughness of task. Brushed teeth with min A to place toothpaste in L hand to open. Donned pants with mod A to thread LLE + pull over L hip. Min A for sit<>stand transfer. Amb 14 feet to bed with min A for balance. Sit EOB > sup with min A to put LLE on bed. Increasingly agitated throughout session, requesting to be put back to bed and to be left alone. C/o of generalized pain during session did not quantify, nursing aware. Left with HOB >30, bed alarm on, call bell in reach.   Therapy Documentation Precautions:  Precautions Precautions: Fall Precaution Comments: L hemi, L inattention Restrictions Weight Bearing Restrictions: No Pain: Pain Assessment Pain Scale: Faces Faces Pain Scale: Hurts little more  ADL: See Care Tool for more details.  Therapy/Group: Individual Therapy  Claudie Revering, MS, OTR/L Perrin Maltese OTR/L  04/10/2020, 3:03 PM

## 2020-04-10 NOTE — Progress Notes (Signed)
[place] PHYSICAL MEDICINE & REHABILITATION PROGRESS NOTE   Subjective/Complaints: No complaints this morning Denies pain  ROS: Patient denies CP, SOB, N/V/D   Objective:   No results found. No results for input(s): WBC, HGB, HCT, PLT in the last 72 hours. No results for input(s): NA, K, CL, CO2, GLUCOSE, BUN, CREATININE, CALCIUM in the last 72 hours.  Intake/Output Summary (Last 24 hours) at 04/10/2020 1219 Last data filed at 04/10/2020 0900 Gross per 24 hour  Intake 760 ml  Output --  Net 760 ml    Physical Exam: Vital Signs Blood pressure 111/67, pulse (!) 50, temperature 98.1 F (36.7 C), resp. rate 18, height 5\' 11"  (1.803 m), weight 66.8 kg, SpO2 100 %.  Gen: no distress, normal appearing HEENT: oral mucosa pink and moist, NCAT Cardio: Bradycardic Chest: normal effort, normal rate of breathing Abd: soft, non-distended Ext: no edema Skin: intact  Motor: Left upper extremity shoulder abduction 3-/5, distally 4/5- unchanged  Left lower extremity: 4 -to 4/5 proximal to distal  Assessment/Plan: 1. Functional deficits which require 3+ hours per day of interdisciplinary therapy in a comprehensive inpatient rehab setting.  Physiatrist is providing close team supervision and 24 hour management of active medical problems listed below.  Physiatrist and rehab team continue to assess barriers to discharge/monitor patient progress toward functional and medical goals  Care Tool:  Bathing    Body parts bathed by patient: Front perineal area,Buttocks   Body parts bathed by helper: Buttocks Body parts n/a: Right arm,Left arm,Chest,Abdomen,Right upper leg,Left upper leg,Right lower leg,Left lower leg,Face (pt declined this session)   Bathing assist Assist Level: Moderate Assistance - Patient 50 - 74% (sit<>stand)     Upper Body Dressing/Undressing Upper body dressing   What is the patient wearing?: Pull over shirt    Upper body assist Assist Level: Moderate Assistance  - Patient 50 - 74%    Lower Body Dressing/Undressing Lower body dressing      What is the patient wearing?: Pants     Lower body assist Assist for lower body dressing: Maximal Assistance - Patient 25 - 49%     Toileting Toileting    Toileting assist Assist for toileting: Maximal Assistance - Patient 25 - 49% (sit<>stand, clothing management)     Transfers Chair/bed transfer  Transfers assist     Chair/bed transfer assist level: Moderate Assistance - Patient 50 - 74% Chair/bed transfer assistive device: assist      Assist level: Minimal Assistance - Patient > 75% Assistive device: Walker-rolling Max distance: 17ft   Walk 10 feet activity   Assist     Assist level: Minimal Assistance - Patient > 75% Assistive device: Walker-rolling   Walk 50 feet activity   Assist Walk 50 feet with 2 turns activity did not occur: Safety/medical concerns (due to patient fatigue/weakness)  Assist level: Minimal Assistance - Patient > 75% Assistive device: Walker-rolling    Walk 150 feet activity   Assist Walk 150 feet activity did not occur: Safety/medical concerns (due to patient fatigue/weakness)  Assist level: Moderate Assistance - Patient - 50 - 74% Assistive device: Walker-rolling    Walk 10 feet on uneven surface  activity   Assist Walk 10 feet on uneven surfaces activity did not occur: Safety/medical concerns (due to patient fatigue/weakness)         Wheelchair     Assist Will patient use wheelchair at discharge?: Yes Type of Wheelchair: Manual    Wheelchair assist  level: Dependent - Patient 0% (TIS w/c) Max wheelchair distance: 150    Wheelchair 50 feet with 2 turns activity    Assist        Assist Level: Dependent - Patient 0%   Wheelchair 150 feet activity     Assist      Assist Level: Dependent - Patient 0%    Medical Problem List and Plan: 1. 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    Continue CIR PT, OT, SLP  Qd therapy d/t tolerance/participation , would do well at SNF level to complete IV abx, Buyer, retail indicates funding for CIR not SNF  , daughter is not able to care for him by herself, no other family, has LTC bed available in 2 wks , daughter is looking  Short term SNF bed.This may not not happen prior to tomorrow Pt is unable to manage finances or return to driving due to stroke                               2.  Antithrombotics: -DVT/anticoagulation:  Pharmaceutical: Other (comment)--Eliquis.              -antiplatelet therapy: N/A--has been off ASA/Brilinta.  3. Chronic HA/Cervicalgia/Pain Management: Was on flexeril prn PTA. Will monitor N/A.   Continue K pad and cervical spine XR given new neck pain and TTP on cervical spine.  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             Monitor with increased exertion  Muscle rub ordered on 11/26   12/5 encouraged him to use kpad! 4. Mood: LCSW to follow for evaluation and support.              -antipsychotic agents: Monitor for now.   -Less somnolent with decrease in klonopin- continue to monitor.  5. Neuropsych: This patient is not capable of making decisions on his own behalf. 6. Skin/Wound Care: Routine pressure relief measures. Protein supplement due to evidence of malnutrition.  7. Fluids/Electrolytes/Nutrition: Monitor I/Os.   9. HTN: Monitor BP educate on importance of compliance. SBP goal 130-150   Vitals:   04/10/20 0524 04/10/20 0842  BP: 111/67   Pulse: (!) 45 (!) 50  Resp: 18   Temp: 98.1 F (36.7 C)   SpO2: 100%   HR down to 45 this morning, has been 50s typically- continue to monitor TID. Check EKG and  digoxin level.  10. ICM/Heart failure with persistent apical thrombus: Monitor for signs and symptoms of fluid overload. Daily weights. Continue Eliquis 11. A fib: Monitor HR -continue Lanoxin and Eliquis.   HR 50s on dig  12. Acute on chronic renal failure: Likely due to gastroenteritis.   Resolved - DC IV, creat should improve off metformin 13. Electrolytes abnormalities: Resolved post supplementation 14. Anemia: Recheck iron levels.                    Hemoglobin 9.7 on 11/22 , 9.3 on 12/6 15. T2DM with hyperglycemia: Hgb A1C-6.8.  probable neuropathy trial gabapentin low dose   Will monitor BS ac/hs.Add CM restrictions to diet.              Monitor with increased mobility CBG (last 3)  Recent Labs  04/09/20 2102 04/10/20 0615 04/10/20 1111  GLUCAP 139* 124* 72  may d/c metformin CBGs slightly elevated: continue to monitor.  16. 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 MSSA bacteremia  TEE showed no valve vegetations, no other sig structural abnl in report cardiology to f/u with recs  afeb off IV cefazolin  , WBCs normal  19.  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 Urinary incontinence - mainly due to cognition , cont timed toileting     LOS: 36 days A FACE TO FACE EVALUATION WAS PERFORMED  Clint Bolder P Kemani Heidel 04/10/2020, 12:19 PM

## 2020-04-10 NOTE — Progress Notes (Signed)
Physical Therapy Session Note  Patient Details  Name: [name] MRN: [id number] Date of Birth: 1941-02-08  Today's Date: 04/10/2020 PT Individual Time: 4132-4401 PT Individual Time Calculation (min): 14 min   and  Today's Date: 04/10/2020 PT Missed Time: 16 Minutes Missed Time Reason: Patient fatigue;Patient unwilling to participate  Short Term Goals: Week 5:  PT Short Term Goal 1 (Week 5): STG= LTG based on ELOS  Skilled Therapeutic Interventions/Progress Updates:    Pt received supine in bed with HOB maximally elevated and pt having scooted down to foot of bed with legs propped up on footboard. Pt making inappropriate comments to therapist today, unaware of his location, asking therapist to "come lay in the bed" with him. Therapist attempts to reorient patient making him aware that he had a CVA (pt aware of L sided weakness stating he is having a hard time moving that arm) and redirect and educated pt that therapist is present to work on mobility. Pt states he feels "horrible" and that he hurts "all over" but specifically lists his "head, neck, and back" - RN notified for medication administration. Despite repeated attempts at gentle redirection and encouragement to progress to OOB mobility (encouraging him to walk to get a newspaper) pt repeatedly declines stating he doesn't feel well today. Provided +2 total assist to scoot towards HOB via bed pads to improve pt's position in the bed. Therapeutically repositioned in L sidelying with pillows placed for pressure relief then left with needs in reach and bed alarm on. Missed 16 minutes of skilled physical therapy.  Therapy Documentation Precautions:  Precautions Precautions: Fall Precaution Comments: L hemi, L inattention Restrictions Weight Bearing Restrictions: No  Pain: Reports pain "all over" but specifically his head, neck, and back - RN notified for medication administration - assisted with repositioning in the bed for pain  management.   Therapy/Group: Individual Therapy  Ginny Forth , PT, DPT, CSRS  04/10/2020, 12:17 PM

## 2020-04-10 NOTE — Progress Notes (Signed)
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  Ongoing  GOAL:   Patient will meet greater than or equal to 90% of their needs  Progressing  MONITOR:   PO intake,Supplement acceptance,Diet advancement,Labs,Weight trends,Skin  REASON FOR ASSESSMENT:   Consult Diet education  ASSESSMENT:   79 year old male with PMH of T2DM, HTN, CAD s/p CABG in 2020, ICM, cardioembolic CVA, COPD who was admitted on 02/28/20 after being found on the floor with left hemiparesis, facial droop, and multiple abrasions. 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 Hospital course further complicated by post-stroke dysphagia. Pt started on a dysphagia 1 diet with nectar-thick liquids. Admitted to CIR on 11/10.  Noted plan is for pt to d/c to SNF once bed available.  Pt currently on a dysphagia 2 diet with thin liquids. Meal completions have been 75-100%.  Pt unavailable at time of RD visit. Pt consuming most supplements per [facility] documentation. Will continue with current supplement regimen.  CIR admit weight: 65.4 kg Current weight: 66.8 kg  Medications reviewed and  include: ProSource Plus BID, Ensure Enlive TID, SSI, Juven, vitamin C 250 mg daily   Labs reviewed: BUN 33, creatinine 1.25 CBG's: 72-162 x 24 hours  Diet Order:   Diet Order            DIET DYS 2 Room service appropriate? Yes; Fluid consistency: Thin  Diet effective now                 EDUCATION NEEDS:   Not appropriate for education at this time  Skin:  Skin Assessment: Skin Integrity Issues: Other: non-pressure wound to left shoulder, right knee  Last BM:  04/10/20 large type 4  Height:   Ht Readings from Last 1 Encounters:  03/10/20 5\' 11"  (1.803 m)    Weight:   Wt Readings from Last 1 Encounters:  04/10/20 66.8 kg    BMI:  Body mass index is 20.54 kg/m.  Estimated Nutritional Needs:   Kcal:  1800-2000  Protein:  85-100 grams  Fluid:  1.8-2.0 L    04/12/20, MS, RD, LDN Inpatient Clinical Dietitian Please see [facility] for contact information.

## 2020-04-10 NOTE — Progress Notes (Signed)
Patient ID: [name], male   DOB: 1941/02/17, 79 y.o.   MRN: [id number]   SW waiting on follow up from [facility] Jacqeline Broers,BSW [phone]

## 2020-04-11 ENCOUNTER — Inpatient Hospital Stay (HOSPITAL_COMMUNITY): Payer: Medicare PPO | Admitting: Occupational Therapy

## 2020-04-11 ENCOUNTER — Inpatient Hospital Stay (HOSPITAL_COMMUNITY): Payer: Medicare PPO | Admitting: Physical Therapy

## 2020-04-11 ENCOUNTER — Inpatient Hospital Stay (HOSPITAL_COMMUNITY): Payer: Medicare PPO | Admitting: Speech Pathology

## 2020-04-11 LAB — DIGOXIN LEVEL: Digoxin Level: 0.4 ng/mL — ABNORMAL LOW (ref 0.8–2.0)

## 2020-04-11 LAB — GLUCOSE, CAPILLARY
Glucose-Capillary: 87 mg/dL (ref 70–99)
Glucose-Capillary: 185 mg/dL — ABNORMAL HIGH (ref 70–99)
Glucose-Capillary: 128 mg/dL — ABNORMAL HIGH (ref 70–99)
Glucose-Capillary: 177 mg/dL — ABNORMAL HIGH (ref 70–99)

## 2020-04-11 MED ORDER — CLONAZEPAM 0.25 MG PO TBDP
0.2500 mg | ORAL_TABLET | Freq: Two times a day (BID) | ORAL | Status: DC | PRN
  Administered 2020-04-12 – 2020-04-13 (×3): 0.25 mg via ORAL
  Filled 2020-04-11 (×4): qty 1

## 2020-04-11 NOTE — Progress Notes (Signed)
Physical Therapy Session Note  Patient Details  Name: [name] MRN: [id number] Date of Birth: [birth year]  Today's Date: 04/11/2020 PT Individual Time: 1400-1438 PT Individual Time Calculation (min): 38 min   Short Term Goals: Week 5:  PT Short Term Goal 1 (Week 5): STG= LTG based on ELOS  Skilled Therapeutic Interventions/Progress Updates:    Pt received supine in bed and with significant, gentle encouragement agreeable to participation in therapy session. Supine>sitting L EOB, HOB flat and not using bedrails, with min assist for trunk upright given increased time. Sit>stand EOB>RW with assist for L LE repositioning due to excessive external rotation and min assist for lifting/balance due to L lean - total assist for L hand placement on/off RW orthotic throughout session. Pt suddenly reports need to use bathroom. Gait training ~36ft into bathroom using RW with min assist for balance due to L lean and for AD management/steering - requires mod assist for balance at end of walk as pt not stepping backwards close enough to toilet requiring max cuing to correct for increased safety. Standing with L UE support on RW required max assist for LB clothing management and pt noted to have started becoming incontinent of urine due to urgency. Pt unable to void bowels despite increased time with pt reporting feeling constipated. Sit<>stand to/from [facility] over toilet using RW and grab bar with min assist. Gait training ~17ft to sink using RW with min assist for balance and mod assist for AD management to ensure pt kept it safely in front of him while turning to walk up to sink. Standing hand hygiene with min assist for balance due to L lean (max cuing to step L foot forward to improve BOS) and max assist to engage and attend to L hand during task as pt not fully rinsing off soap nor drying that hand. With gentle redirection of tasks, pt performed gait training ~115ft to day room using RW with continued min assist for  balance due to L lean resulting in lack of R weight shift during stance and decreased L LE step length, cuing for improvement - mod assist for AD management due to veering L - +2 present for safety. Dynamic standing balance task via bounce passing a ball to +2 assist, requires min assist for balance due to L lean and pt not engaging L hand in task despite cuing and attempt at hand-over-hand facilitation. Gait training ~114ft back to room as described above - pt perseverating on having his w/c brought to him but with gentle encouragement and redirection agreeable to continue gait training. Again requires mod assist at end of walk when turning to sit on bed due to pt initiating sit prior to stepping back towards bed. Sit>supine with CGA for safety. Pt reports increased fatigue at end of session. Left supine in bed with needs in reach and bed alarm on.  Therapy Documentation Precautions:  Precautions Precautions: Fall Precaution Comments: L hemi, L inattention Restrictions Weight Bearing Restrictions: No  Pain: No reports of pain throughout session but pt will state "you're killing me" when encouraged to perform tasks with decreased assistance.   Therapy/Group: Individual Therapy  Ginny Forth , PT, DPT, CSRS  04/11/2020, 12:23 PM

## 2020-04-11 NOTE — Progress Notes (Signed)
[place] PHYSICAL MEDICINE & REHABILITATION PROGRESS NOTE   Subjective/Complaints: No complaints this morning. Somnolent. Discussed with RN- HR again in 40s this AM so digoxin was held. Will consult cardiology for their input.   ROS: Patient denies CP, SOB, N/V/D   Objective:   No results found. No results for input(s): WBC, HGB, HCT, PLT in the last 72 hours. No results for input(s): NA, K, CL, CO2, GLUCOSE, BUN, CREATININE, CALCIUM in the last 72 hours.  Intake/Output Summary (Last 24 hours) at 04/11/2020 1051 Last data filed at 04/11/2020 0853 Gross per 24 hour  Intake 480 ml  Output --  Net 480 ml    Physical Exam: Vital Signs Blood pressure 133/63, pulse (!) 46, temperature 98.1 F (36.7 C), resp. rate 18, height 5\' 11"  (1.803 m), weight 66.8 kg, SpO2 100 %.  Gen: no distress, normal appearing HEENT: oral mucosa pink and moist, NCAT Cardio: Bradycardic Chest: normal effort, normal rate of breathing Abd: soft, non-distended Ext: no edema Motor: Left upper extremity shoulder abduction 3-/5, distally 4/5- unchanged  Left lower extremity: 4 -to 4/5 proximal to distal  Assessment/Plan: 1. Functional deficits which require 3+ hours per day of interdisciplinary therapy in a comprehensive inpatient rehab setting.  Physiatrist is providing close team supervision and 24 hour management of active medical problems listed below.  Physiatrist and rehab team continue to assess barriers to discharge/monitor patient progress toward functional and medical goals  Care Tool:  Bathing    Body parts bathed by patient: Front perineal area,Buttocks   Body parts bathed by helper: Buttocks Body parts n/a: Right arm,Left arm,Chest,Abdomen,Right upper leg,Left upper leg,Right lower leg,Left lower leg,Face (pt declined this session)   Bathing assist Assist Level: Moderate Assistance - Patient 50 - 74% (sit<>stand)     Upper Body Dressing/Undressing Upper body dressing   What is the  patient wearing?: Pull over shirt    Upper body assist Assist Level: Moderate Assistance - Patient 50 - 74%    Lower Body Dressing/Undressing Lower body dressing      What is the patient wearing?: Pants     Lower body assist Assist for lower body dressing: Moderate Assistance - Patient 50 - 74%     Toileting Toileting Toileting Activity did not occur and hygiene only): N/A (no void or bm)  Toileting assist Assist for toileting: Maximal Assistance - Patient 25 - 49%     Transfers Chair/bed transfer  Transfers assist     Chair/bed transfer assist level: Minimal Assistance - Patient > 75% Chair/bed transfer assistive device: Press photographer   Ambulation assist      Assist level: Minimal Assistance - Patient > 75% Assistive device: Walker-rolling Max distance: 143ft   Walk 10 feet activity   Assist     Assist level: Minimal Assistance - Patient > 75% Assistive device: Walker-rolling   Walk 50 feet activity   Assist Walk 50 feet with 2 turns activity did not occur: Safety/medical concerns (due to patient fatigue/weakness)  Assist level: Minimal Assistance - Patient > 75% Assistive device: Walker-rolling    Walk 150 feet activity   Assist Walk 150 feet activity did not occur: Safety/medical concerns (due to patient fatigue/weakness)  Assist level: Moderate Assistance - Patient - 50 - 74% Assistive device: Walker-rolling    Walk 10 feet on uneven surface  activity   Assist Walk 10 feet on uneven surfaces activity did not occur: Safety/medical concerns (due to patient fatigue/weakness)  Wheelchair     Assist Will patient use wheelchair at discharge?: Yes Type of Wheelchair: Manual    Wheelchair assist level: Dependent - Patient 0% (TIS w/c) Max wheelchair distance: 150    Wheelchair 50 feet with 2 turns activity    Assist        Assist Level: Dependent - Patient 0%   Wheelchair  150 feet activity     Assist      Assist Level: Dependent - Patient 0%    Medical Problem List and Plan: 1. 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    Continue CIR PT, OT, SLP  Qd therapy d/t tolerance/participation , would do well at SNF level to complete IV abx, Buyer, retail indicates funding for CIR not SNF  , daughter is not able to care for him by herself, no other family, has LTC bed available in 2 wks , daughter is looking  Short term SNF bed.This may not not happen prior to tomorrow Pt is unable to manage finances or return to driving due to stroke                               2.  Antithrombotics: -DVT/anticoagulation:  Pharmaceutical: Other (comment)--Eliquis.              -antiplatelet therapy: N/A--has been off ASA/Brilinta.  3. Chronic HA/Cervicalgia/Pain Management: Was on flexeril prn PTA. Will monitor N/A.   Continue K pad and cervical spine XR given new neck pain and TTP on cervical spine.  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             Monitor with increased exertion  Muscle rub ordered on 11/26   12/5 encouraged him to use kpad! 4. Mood: LCSW to follow for evaluation and support.              -antipsychotic agents: Monitor for now.   -Less somnolent with decrease in klonopin- continue to monitor.  5. Neuropsych: This patient is not capable of making decisions on his own behalf. 6. Skin/Wound Care: Routine pressure relief measures. Protein supplement due to evidence of malnutrition.  7. Fluids/Electrolytes/Nutrition: Monitor I/Os.   9. HTN: Monitor BP educate on importance of compliance. SBP goal 130-150   Vitals:   04/10/20 1943 04/11/20 0522  BP: 127/67 133/63  Pulse: (!) 51 (!) 46   Resp: 16 18  Temp: 98.4 F (36.9 C) 98.1 F (36.7 C)  SpO2: 100% 100%  HR again down to 40s and digoxin was held 12/17- digoxin level is low- will consult cardiology for input.   10. ICM/Heart failure with persistent apical thrombus: Monitor for signs and symptoms of fluid overload. Daily weights. Continue Eliquis 11. A fib: Monitor HR -continue Lanoxin and Eliquis.   See #9  12. Acute on chronic renal failure: Likely due to gastroenteritis.   Resolved - DC IV, creat should improve off metformin 13. Electrolytes abnormalities: Resolved post supplementation 14. Anemia: Recheck iron levels.                    Hemoglobin 9.7 on 11/22 , 9.3 on 12/6 15. T2DM with hyperglycemia: Hgb A1C-6.8.  probable neuropathy trial gabapentin low dose   Will monitor BS ac/hs.Add CM restrictions to  diet.              Monitor with increased mobility CBG (last 3)  Recent Labs    04/10/20 1617 04/10/20 2043 04/11/20 0559  GLUCAP 180* 119* 87  may d/c metformin CBGs slightly elevated: continue to monitor.  16. 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 MSSA bacteremia  TEE showed no valve vegetations, no other sig structural abnl in report cardiology to f/u with recs  afeb off IV cefazolin  , WBCs normal  19.  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 Urinary incontinence - mainly due to cognition , cont timed toileting     LOS: 37 days A FACE TO FACE EVALUATION WAS PERFORMED  [name] P Saraih Lorton 04/11/2020, 10:51 AM

## 2020-04-11 NOTE — Progress Notes (Signed)
Occupational Therapy Weekly Progress Note  Patient Details  Name: [name] MRN: [id number] Date of Birth: 1941-02-25  Beginning of progress report period: April 04, 2020 End of progress report period: April 11, 2020  Today's Date: 04/11/2020 OT Individual Time: 0930-1000 OT Individual Time Calculation (min): 30 min    Patient cont to slowly progress towards LTG. Pt is primarily limited by increased agitation and confusion resulting in inability to participate meaningfully in skilled OT. DC date remains unclear. Pt overall is performing functional transfers with min A via stand-pivot and is ambulating bedroom distances with min A for balance/safety awareness/impulsivity. Consistently completes UB dressing/bathing with mod to max A, LB dressing/bathing with mod to max A, seated grooming, and self-feeding with min A for thoroughness + to maintain L grip on ADL items. However, assist levels may vary depending on agitation level and safety awareness. Pt cont to perseverate severely on returning to bed throughout sessions. Brunnstrom levels for LUE: 2-3, L hand: 3. Occasionally noted with mod L lean in standing and sitting EOB. Patient continues to demonstrate the following deficits: abnormal tone, decreased coordination and L hemiparesis and decreased attention, decreased awareness, decreased problem solving, decreased safety awareness and decreased memory and therefore will continue to benefit from skilled OT intervention to enhance overall performance with BADL and Reduce care partner burden.  See Patient's Care Plan for progression toward long term goals.  Patient progressing toward long term goals..  Continue plan of care.  Skilled Therapeutic Interventions/Progress Updates:    Pt received on toilet with NT present. Did not void but performed anterior pericare with CGA in stedy. Transferred to TIS in stedy 2/2 time. Washed hands at sink in TIS with min A for thoroughness for L hand.  Brushed teeth with min A to maintain L grip on tooth paste and squeeze onto tube. Doffed hospital gown with min A to untie and remove from waist, donned long-sleeved shirt with overall max A to don over LUE + over head + pull down. Donned new brief with overall max A to thread BLE + pull over hips in standing, pt able to pull up towards thighs. Donned pants with max A to thread BLE and pull over hips in standing. Sit<>stand x2 with min A for balance during LB dressing. Amb to bed with overall min A for balance. Sit EOB > sup in bed with overall min A to bring LLE onto bed and readjust shoulders. Pt primarily limited by agitation + confusion this date, perseverating that "my brother Molly Maduro stole my money" and requesting that the police come repeatedly, but easily redirected. C/O of generalized pain, but did not quantify, nursing notified. Left with HOB at 30, bed alarm engaged, call bell in reach.   Therapy Documentation Precautions:  Precautions Precautions: Fall Precaution Comments: L hemi, L inattention Restrictions Weight Bearing Restrictions: No  Pain: Pain Assessment Pain Scale: Faces Pain Score: 0-No pain Faces Pain Scale: Hurts little more   ADL: See Care Tool for more details.  Therapy/Group: Individual Therapy  Ivan Revering, MS, OTR/L 04/11/2020, 12:22 PM

## 2020-04-11 NOTE — Progress Notes (Signed)
Speech Language Pathology Daily Session Note  Patient Details  Name: [name] MRN: [id number] Date of Birth: 15-Sep-1940  Today's Date: 04/11/2020 SLP Individual Time: 1030-1100 SLP Individual Time Calculation (min): 30 min  Short Term Goals: Week 3: SLP Short Term Goal 1 (Week 3): STG=LTG due to remaining length of stay SLP Short Term Goal 1 - Progress (Week 3): Progressing toward goal  Skilled Therapeutic Interventions:   Patient seen by SLP to address swallow function goals. Patient was pleasant, reclined in bed and looking at newspaper when SLP entered the room. When asked, he said he was hungry and chose Corn Flakes when given some choices. After SLP assisted with patient positioning in bed and setup of cereal, patient fed himself independently but was impulsive and requires cues to attempt to slow him down. He exhibited prolonged mastication, anterior spillage on left side and oral residuals post initial swallows. Patient not aware of any of this and SLP had to provide mod A cues to clear residuals and clean off anterior spillage. Patient rather abruptly requested "Call [name]" (his daughter) and when asked why, he stated he wanted to know when she was coming to take him to her home. When SLP told patient he couldn't call his daughter from the room phone since it is not a local area code, he said, "She's not right there?", gesturing behind SLP in room. Patient continues to benefit from skilled SLP intervention to maximize cognitive-linguistic, speech and cognitive function goals prior to discharge.   Pain Pain Assessment Pain Scale: 0-10 Pain Score: 0-No pain  Therapy/Group: Individual Therapy  Angela Nevin, MA, CCC-SLP Speech Therapy

## 2020-04-12 ENCOUNTER — Inpatient Hospital Stay (HOSPITAL_COMMUNITY): Payer: Medicare PPO

## 2020-04-12 LAB — GLUCOSE, CAPILLARY
Glucose-Capillary: 88 mg/dL (ref 70–99)
Glucose-Capillary: 144 mg/dL — ABNORMAL HIGH (ref 70–99)
Glucose-Capillary: 157 mg/dL — ABNORMAL HIGH (ref 70–99)
Glucose-Capillary: 111 mg/dL — ABNORMAL HIGH (ref 70–99)

## 2020-04-12 MED ORDER — QUETIAPINE FUMARATE 50 MG PO TABS
50.0000 mg | ORAL_TABLET | Freq: Every evening | ORAL | Status: DC | PRN
  Administered 2020-04-13: 50 mg via ORAL
  Filled 2020-04-12: qty 1

## 2020-04-12 MED ORDER — QUETIAPINE FUMARATE 25 MG PO TABS
25.0000 mg | ORAL_TABLET | Freq: Three times a day (TID) | ORAL | Status: DC | PRN
  Administered 2020-04-12 – 2020-04-14 (×5): 25 mg via ORAL
  Filled 2020-04-12 (×5): qty 1

## 2020-04-12 NOTE — Progress Notes (Signed)
[name] PHYSICAL MEDICINE & REHABILITATION PROGRESS NOTE   Subjective/Complaints:  Pt was agitated/restless/trying to get OOB yesterday afternoon per nursing-  Also held Digoxin again this AM due to Pulse of 46.   Gets worse as afternoon passes.  Pt did c/o pain in legs- which was new, talking- will give him prn pain meds after d/w nursing.   ROS: unable to assess due to cognition   Objective:   No results found. No results for input(s): WBC, HGB, HCT, PLT in the last 72 hours. No results for input(s): NA, K, CL, CO2, GLUCOSE, BUN, CREATININE, CALCIUM in the last 72 hours.  Intake/Output Summary (Last 24 hours) at 04/12/2020 1359 Last data filed at 04/12/2020 1245 Gross per 24 hour  Intake 640 ml  Output --  Net 640 ml    Physical Exam: Vital Signs Blood pressure (!) 144/93, pulse (!) 57, temperature 97.9 F (36.6 C), temperature source Oral, resp. rate 16, height 5\' 11"  (1.803 m), weight 66.7 kg, SpO2 100 %.  Gen: sitting up slightly in bed- awake, but lethargic, NAD HEENT: oral mucosa pink and moist, NCAT Cardio: bradycardic mid 40s Chest: CTA B/L- no W/R/R- good air movement Abd: Soft, NT, ND, (+)BS  Ext: no edema Motor: Left upper extremity shoulder abduction 3-/5, distally 4/5- unchanged  Left lower extremity: 4 -to 4/5 proximal to distal  Assessment/Plan: 1. Functional deficits which require 3+ hours per day of interdisciplinary therapy in a comprehensive inpatient rehab setting.  Physiatrist is providing close team supervision and 24 hour management of active medical problems listed below.  Physiatrist and rehab team continue to assess barriers to discharge/monitor patient progress toward functional and medical goals  Care Tool:  Bathing    Body parts bathed by patient: Front perineal area,Buttocks   Body parts bathed by helper: Buttocks Body parts n/a: Right arm,Left arm,Chest,Abdomen,Right upper leg,Left upper leg,Right lower leg,Left lower leg,Face  (pt declined this session)   Bathing assist Assist Level: Moderate Assistance - Patient 50 - 74% (sit<>stand)     Upper Body Dressing/Undressing Upper body dressing   What is the patient wearing?: Pull over shirt,Hospital gown only    Upper body assist Assist Level: Maximal Assistance - Patient 25 - 49%    Lower Body Dressing/Undressing Lower body dressing      What is the patient wearing?: Pants,Incontinence brief     Lower body assist Assist for lower body dressing: Maximal Assistance - Patient 25 - 49%     Toileting Toileting Toileting Activity did not occur (Clothing management and hygiene only): N/A (no void or bm)  Toileting assist Assist for toileting: Dependent - Patient 0% (in stedy for hygeine)     Transfers Chair/bed transfer  Transfers assist     Chair/bed transfer assist level: Minimal Assistance - Patient > 75% Chair/bed transfer assistive device:   Ambulation assist      Assist level: Minimal Assistance - Patient > 75% Assistive device: Walker-rolling Max distance: 11ft   Walk 10 feet activity   Assist     Assist level: Minimal Assistance - Patient > 75% Assistive device: Walker-rolling   Walk 50 feet activity   Assist Walk 50 feet with 2 turns activity did not occur: Safety/medical concerns (due to patient fatigue/weakness)  Assist level: Minimal Assistance - Patient > 75% Assistive device: Walker-rolling    Walk 150 feet activity   Assist Walk 150 feet activity did not occur: Safety/medical concerns (due to patient fatigue/weakness)  Assist level:  Minimal Assistance - Patient > 75% Assistive device: Walker-rolling    Walk 10 feet on uneven surface  activity   Assist Walk 10 feet on uneven surfaces activity did not occur: Safety/medical concerns (due to patient fatigue/weakness)         Wheelchair     Assist Will patient use wheelchair at discharge?: Yes Type of Wheelchair:  Manual    Wheelchair assist level: Dependent - Patient 0% (TIS w/c) Max wheelchair distance: 150    Wheelchair 50 feet with 2 turns activity    Assist        Assist Level: Dependent - Patient 0%   Wheelchair 150 feet activity     Assist      Assist Level: Dependent - Patient 0%    Medical Problem List and Plan: 1. 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    Continue CIR PT, OT, SLP  Qd therapy d/t tolerance/participation , would do well at SNF level to complete IV abx, Buyer, retail indicates funding for CIR not SNF  , daughter is not able to care for him by herself, no other family, has LTC bed available in 2 wks , daughter is looking  Short term SNF bed.This may not not happen prior to tomorrow Pt is unable to manage finances or return to driving due to stroke                                2.  Antithrombotics: -DVT/anticoagulation:  Pharmaceutical: Other (comment)--Eliquis.              -antiplatelet therapy: N/A--has been off ASA/Brilinta.  3. Chronic HA/Cervicalgia/Pain Management: Was on flexeril prn PTA. Will monitor N/A.   Continue K pad and cervical spine XR given new neck pain and TTP on cervical spine.  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  12/18- pt c/o pain in legs- wasn't specific about joint, etc- will give prn meds.              Monitor with increased exertion  Muscle rub ordered on 11/26   12/5 encouraged him to use kpad! 4. Mood: LCSW to follow for evaluation and support.              -antipsychotic agents: Monitor for now.   -Less somnolent with decrease in klonopin- continue to monitor.   12/18- was restless, almost agitated, trying to GET OOB per nursing yesterday- if  continues, might need to add something else prn.  5. Neuropsych: This patient is not capable of making decisions on his own behalf. 6. Skin/Wound Care: Routine pressure relief measures. Protein supplement due to evidence of malnutrition.  7. Fluids/Electrolytes/Nutrition: Monitor I/Os.   9. HTN: Monitor BP educate on importance of compliance. SBP goal 130-150   Vitals:   04/12/20 0607 04/12/20 1303  BP: (!) 130/51 (!) 144/93  Pulse: (!) 48 (!) 57  Resp: 16 16  Temp: 97.7 F (36.5 C) 97.9 F (36.6 C)  SpO2: 100%   HR again down to 40s and digoxin was held 12/17- digoxin level is low- will consult cardiology for input.  12/18- holding digoxin- Cards hasn't dropped a note yet- hopefully this weekend.    10. ICM/Heart failure with persistent apical thrombus: Monitor for signs  and symptoms of fluid overload. Daily weights. Continue Eliquis 11. A fib: Monitor HR -continue Lanoxin and Eliquis.   See #9  12. Acute on chronic renal failure: Likely due to gastroenteritis.   Resolved - DC IV, creat should improve off metformin 13. Electrolytes abnormalities: Resolved post supplementation 14. Anemia: Recheck iron levels.                    Hemoglobin 9.7 on 11/22 , 9.3 on 12/6 15. T2DM with hyperglycemia: Hgb A1C-6.8.  probable neuropathy trial gabapentin low dose   Will monitor BS ac/hs.Add CM restrictions to diet.              Monitor with increased mobility CBG (last 3)  Recent Labs    04/11/20 2146 04/12/20 0629 04/12/20 1123  GLUCAP 177* 88 144*  may d/c metformin CBGs slightly elevated: continue to monitor.   12/18- BGs 88-177- con't regimen for now 16. 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 MSSA bacteremia  TEE showed no valve vegetations, no other sig structural abnl in report cardiology to f/u with recs  afeb off IV cefazolin  , WBCs normal  19.  Cognitive  deficits from CVA +/- encephalopathy from bacteremia  Trial low dose Klonopin prn, used only intermittently  12/17: klonopin has been given every day and he is quite somnolent- will d/c  20. Urinary incontinence - mainly due to cognition , cont timed toileting  12/18- per nursing, they are trying- to do timed voiding- still wet- con't regimen     LOS: 38 days A FACE TO FACE EVALUATION WAS PERFORMED  [name] 04/12/2020, 1:59 PM

## 2020-04-12 NOTE — Progress Notes (Signed)
Physical Therapy Session Note  Patient Details  Name: CANDELARIO STEPPE MRN: [id number] Date of Birth: 1941/03/07  Today's Date: 04/12/2020 PT Individual Time: 0800-0828 PT Individual Time Calculation (min): 28 min   Short Term Goals: Week 5:  PT Short Term Goal 1 (Week 5): STG= LTG based on ELOS  Skilled Therapeutic Interventions/Progress Updates:    Patient received supine in bed, asleep, initially agreeable to PT. He denied pain when asked. PT attempting to assist patient with donning pants in bed when brief was found to be soiled. ModA for perihygiene as patient was able to wash himself with washcloth with verbal cuing, but required assist for brief placement and change. Patient very resistant to care yelling at PT for "moving too slow," but patient unwilling to assist in his own care. Patient strongly requesting to remain in bed. Bed alarm on, call light within reach.   Therapy Documentation Precautions:  Precautions Precautions: Fall Precaution Comments: L hemi, L inattention Restrictions Weight Bearing Restrictions: No    Therapy/Group: Individual Therapy  Elizebeth Koller, PT, DPT, CBIS  04/12/2020, 7:45 AM

## 2020-04-13 LAB — GLUCOSE, CAPILLARY
Glucose-Capillary: 96 mg/dL (ref 70–99)
Glucose-Capillary: 120 mg/dL — ABNORMAL HIGH (ref 70–99)
Glucose-Capillary: 156 mg/dL — ABNORMAL HIGH (ref 70–99)
Glucose-Capillary: 150 mg/dL — ABNORMAL HIGH (ref 70–99)

## 2020-04-13 NOTE — Plan of Care (Signed)
  Problem: RH BOWEL ELIMINATION Goal: RH STG MANAGE BOWEL WITH ASSISTANCE Description: STG Manage Bowel with Assistance. Min Outcome: Not Progressing; LBM 12/16; suppository late this afternoon will be the plan   Problem: RH SAFETY Goal: RH STG ADHERE TO SAFETY PRECAUTIONS W/ASSISTANCE/DEVICE Description: STG Adhere to Safety Precautions With Assistance/Device. Min assist Outcome: Not Progressing; has been agitated and getting OOB; medications given

## 2020-04-13 NOTE — Progress Notes (Signed)
Patient was agitated this morning at times shouting at staff ; demanding to go to bathroom but with no results at times ; had one time but only to urinate. Meds given for agitation. Patient was able to have rest after lunch and after giving his seroquel prn.

## 2020-04-13 NOTE — Progress Notes (Signed)
[place] PHYSICAL MEDICINE & REHABILITATION PROGRESS NOTE   Subjective/Complaints:  "Leave him alone"- was very restless and agitated yesterday- required Seroquel to calm down- Benzo's weren't working.    ROS: unable to assess.   Objective:   No results found. No results for input(s): WBC, HGB, HCT, PLT in the last 72 hours. No results for input(s): NA, K, CL, CO2, GLUCOSE, BUN, CREATININE, CALCIUM in the last 72 hours.  Intake/Output Summary (Last 24 hours) at 04/13/2020 1243 Last data filed at 04/13/2020 0700 Gross per 24 hour  Intake 550 ml  Output --  Net 550 ml    Physical Exam: Vital Signs Blood pressure 132/64, pulse 62, temperature 98.5 F (36.9 C), resp. rate 18, height 5\' 11"  (1.803 m), weight 66.2 kg, SpO2 100 %.  Gen: sleeping with covers over head, NAD- very irritable HEENT: oral mucosa pink and moist, NCAT Cardio: bradycardic pulse 50s-60s Chest: CTA B/L- no W/R/R- good air movement Abd: Soft, NT, ND, (+)BS  Ext: no edema Motor: Left upper extremity shoulder abduction 3-/5, distally 4/5- unchanged  Left lower extremity: 4 -to 4/5 proximal to distal  Assessment/Plan: 1. Functional deficits which require 3+ hours per day of interdisciplinary therapy in a comprehensive inpatient rehab setting.  Physiatrist is providing close team supervision and 24 hour management of active medical problems listed below.  Physiatrist and rehab team continue to assess barriers to discharge/monitor patient progress toward functional and medical goals  Care Tool:  Bathing    Body parts bathed by patient: Front perineal area,Buttocks   Body parts bathed by helper: Buttocks Body parts n/a: Right arm,Left arm,Chest,Abdomen,Right upper leg,Left upper leg,Right lower leg,Left lower leg,Face (pt declined this session)   Bathing assist Assist Level: Moderate Assistance - Patient 50 - 74% (sit<>stand)     Upper Body Dressing/Undressing Upper body dressing   What is the  patient wearing?: Pull over shirt,Hospital gown only    Upper body assist Assist Level: Maximal Assistance - Patient 25 - 49%    Lower Body Dressing/Undressing Lower body dressing      What is the patient wearing?: Pants,Incontinence brief     Lower body assist Assist for lower body dressing: Maximal Assistance - Patient 25 - 49%     Toileting Toileting Toileting Activity did not occur (Clothing management and hygiene only): N/A (no void or bm)  Toileting assist Assist for toileting: Dependent - Patient 0% (in stedy for hygeine)     Transfers Chair/bed transfer  Transfers assist     Chair/bed transfer assist level: Minimal Assistance - Patient > 75% Chair/bed transfer assistive device:   Ambulation assist      Assist level: Minimal Assistance - Patient > 75% Assistive device: Walker-rolling Max distance: 170ft   Walk 10 feet activity   Assist     Assist level: Minimal Assistance - Patient > 75% Assistive device: Walker-rolling   Walk 50 feet activity   Assist Walk 50 feet with 2 turns activity did not occur: Safety/medical concerns (due to patient fatigue/weakness)  Assist level: Minimal Assistance - Patient > 75% Assistive device: Walker-rolling    Walk 150 feet activity   Assist Walk 150 feet activity did not occur: Safety/medical concerns (due to patient fatigue/weakness)  Assist level: Minimal Assistance - Patient > 75% Assistive device: Walker-rolling    Walk 10 feet on uneven surface  activity   Assist Walk 10 feet on uneven surfaces activity did not occur: Safety/medical concerns (due to patient fatigue/weakness)  Wheelchair     Assist Will patient use wheelchair at discharge?: Yes Type of Wheelchair: Manual    Wheelchair assist level: Dependent - Patient 0% (TIS w/c) Max wheelchair distance: 150    Wheelchair 50 feet with 2 turns activity    Assist        Assist Level:  Dependent - Patient 0%   Wheelchair 150 feet activity     Assist      Assist Level: Dependent - Patient 0%    Medical Problem List and Plan: 1. 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    Continue CIR PT, OT, SLP  Qd therapy d/t tolerance/participation , would do well at SNF level to complete IV abx, Buyer, retail indicates funding for CIR not SNF  , daughter is not able to care for him by herself, no other family, has LTC bed available in 2 wks , daughter is looking  Short term SNF bed.This may not not happen prior to tomorrow Pt is unable to manage finances or return to driving due to stroke   54/65- added seroquel 25 mg TID Prn and 50 mg QHS prn for agitation.                               2.  Antithrombotics: -DVT/anticoagulation:  Pharmaceutical: Other (comment)--Eliquis.              -antiplatelet therapy: N/A--has been off ASA/Brilinta.  3. Chronic HA/Cervicalgia/Pain Management: Was on flexeril prn PTA. Will monitor N/A.   Continue K pad and cervical spine XR given new neck pain and TTP on cervical spine.  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  12/18- pt c/o pain in legs- wasn't specific about joint, etc- will give prn meds.              Monitor with increased exertion  Muscle rub ordered on 11/26   12/5 encouraged him to use kpad! 4. Mood: LCSW to follow for evaluation and support.              -antipsychotic agents: Monitor for now.   -Less somnolent with decrease in klonopin- continue to monitor.   12/18- was restless, almost agitated, trying to GET OOB per nursing yesterday- if continues, might need to add something else prn.   12/19- added seroquel 25 mg TID Prn and 50 mg QHS prn- might need to  come off to go to SNF< but nursing couldn't handle without it yesterday per them.  5. Neuropsych: This patient is not capable of making decisions on his own behalf. 6. Skin/Wound Care: Routine pressure relief measures. Protein supplement due to evidence of malnutrition.  7. Fluids/Electrolytes/Nutrition: Monitor I/Os.   9. HTN: Monitor BP educate on importance of compliance. SBP goal 130-150   Vitals:   04/13/20 0546 04/13/20 0805  BP: 132/64   Pulse: (!) 52 62  Resp: 18   Temp: 98.5 F (36.9 C)   SpO2: 100%   HR again down to 40s and digoxin was held 12/17- digoxin level is low- will consult cardiology for input.  12/18- holding digoxin- Cards hasn't dropped a note yet- hopefully this weekend.    10. ICM/Heart failure with persistent apical thrombus: Monitor for signs and symptoms of fluid overload. Daily  weights. Continue Eliquis 11. A fib: Monitor HR -continue Lanoxin and Eliquis.   See #9  12. Acute on chronic renal failure: Likely due to gastroenteritis.   Resolved - DC IV, creat should improve off metformin 13. Electrolytes abnormalities: Resolved post supplementation 14. Anemia: Recheck iron levels.                    Hemoglobin 9.7 on 11/22 , 9.3 on 12/6 15. T2DM with hyperglycemia: Hgb A1C-6.8.  probable neuropathy trial gabapentin low dose   Will monitor BS ac/hs.Add CM restrictions to diet.              Monitor with increased mobility CBG (last 3)  Recent Labs    04/12/20 2109 04/13/20 0608 04/13/20 1130  GLUCAP 111* 96 120*  may d/c metformin CBGs slightly elevated: continue to monitor.   12/19- BGs 96-120- con't regimen 16. 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 MSSA bacteremia  TEE showed no valve vegetations, no other sig structural abnl in report cardiology to f/u with recs  afeb off IV cefazolin  , WBCs normal  19.  Cognitive deficits from  CVA +/- encephalopathy from bacteremia  Trial low dose Klonopin prn, used only intermittently  12/17: klonopin has been given every day and he is quite somnolent- will d/c  20. Urinary incontinence - mainly due to cognition , cont timed toileting  12/18- per nursing, they are trying- to do timed voiding- still wet- con't regimen    I spent a total of 25 minutes on care >50% on coordinaiton of care today.    LOS: 39 days A FACE TO FACE EVALUATION WAS PERFORMED  Desirai Traxler 04/13/2020, 12:43 PM

## 2020-04-14 ENCOUNTER — Inpatient Hospital Stay (HOSPITAL_COMMUNITY): Payer: Medicare PPO | Admitting: Speech Pathology

## 2020-04-14 ENCOUNTER — Inpatient Hospital Stay (HOSPITAL_COMMUNITY): Payer: Medicare PPO

## 2020-04-14 ENCOUNTER — Inpatient Hospital Stay (HOSPITAL_COMMUNITY): Payer: Medicare PPO | Admitting: Occupational Therapy

## 2020-04-14 LAB — GLUCOSE, CAPILLARY
Glucose-Capillary: 96 mg/dL (ref 70–99)
Glucose-Capillary: 185 mg/dL — ABNORMAL HIGH (ref 70–99)
Glucose-Capillary: 167 mg/dL — ABNORMAL HIGH (ref 70–99)
Glucose-Capillary: 118 mg/dL — ABNORMAL HIGH (ref 70–99)

## 2020-04-14 MED ORDER — CHLORDIAZEPOXIDE HCL 5 MG PO CAPS
5.0000 mg | ORAL_CAPSULE | Freq: Two times a day (BID) | ORAL | Status: DC
  Administered 2020-04-14 – 2020-04-15 (×2): 5 mg via ORAL
  Filled 2020-04-14 (×2): qty 1

## 2020-04-14 MED ORDER — DIGOXIN 125 MCG PO TABS
0.1250 mg | ORAL_TABLET | Freq: Every day | ORAL | Status: DC
  Administered 2020-04-14 – 2020-04-21 (×6): 0.125 mg via ORAL
  Filled 2020-04-14 (×12): qty 1

## 2020-04-14 NOTE — Progress Notes (Signed)
Patient ID: [name], male   DOB: [birth year], 79 y.o.   MRN: [id number]   SW received message from daughter informing sw of potential personal home care (long-term). SW waiting for contact information where to send patient information.  [place], [place] [phone]

## 2020-04-14 NOTE — Progress Notes (Signed)
Speech Language Pathology Weekly Progress and Session Note  Patient Details  Name: [name] MRN: [id number] Date of Birth: [birth year]  Beginning of progress report period: April 07, 2020 End of progress report period: April 14, 2020  Today's Date: 04/14/2020 SLP Individual Time: 0900-0930 SLP Individual Time Calculation (min): 30 min  Short Term Goals: Week 5: SLP Short Term Goal 1 (Week 5): STG=LTG due to remaining length of stay; pt currently pending LTC placement SLP Short Term Goal 1 - Progress (Week 5): Progressing toward goal  New Short Term Goals: Week 6: SLP Short Term Goal 1 (Week 6): STG=LTG due to remaining length of stay; pt currently pending LTC placement    Weekly Progress Updates: Pt is still making very slow and inconsistent progress toward his long term goals; minimal progress/change in his speech or cognition has been made since last reporting period. Due to intense level of physical and cognitive assistance needed by pt for his greatest safety, thearpy recommendations to his daughter (caregiver) have been SNF vs LTC placement. She has decided to purse LTC for pt, and he is currently pending placement. Pt is currently ~Mod-Max assist for basic familiar tasks due to cognitive impairments impacting his basic problem solving, attention, frustration tolerance, short term memory, and awareness. He has also demonstrated fluctuations in verbal agitation and willingness to participate in therapies over the least 1-2 weeks, further limiting his potential for progress. Pt is still tolerating a dysphagia 2 (minced/ground) texture diet with thin liquids, and Min-Mod A verbal cues is required for clearance of left buccal pocketing of solids. He has also started to participate in trials of dysphagia 3 (mechanical soft) solids with SLP. Pt also has mild-mod dysarthria and requires fluctuating levels of cueing (mostly Min-Mod A) for use of overarticulation and increased vocal  intensity to increase his speech intelligibility at the phrase/sentence level. Pt education has been ongoing, however no family has been present for ST sessions to date. Pt would continue to benefit from skilled ST while inpatient in order to maximize functional independence and reduce burden of care prior to discharge. Pt will need strict 24/7 supervision and hands on care for his greatest safety and functioningat discharge, in addition to ST follow upat next level of care.     Intensity: Minumum of 1-2 x/day, 30 to 90 minutes Frequency: 3 to 5 out of 7 days Duration/Length of Stay: Pending LTC placement Treatment/Interventions: Cognitive remediation/compensation;Cueing hierarchy;Functional tasks;Patient/family education;Therapeutic Activities;Speech/Language facilitation;Dysphagia/aspiration precaution training;Internal/external aids   Daily Session  Skilled Therapeutic Interventions: Pt was seen for skilled ST targeting dysphagia and cognitive-linguistic goals. SLP provided trial of Dys 3 (mechanical soft) texture snack with coffee. No overt s/sx aspiration or left pocketing noted during intake today. Pt used compensatory swallow strategies with Min A cues to maintain appropriate positioning for safe intake. Recommend continue current diet, and ST will continue to work toward advancement with pt with potential for Dys 3 trials tray soon as able (pt's participation fluctuates). Pt required Moderate cues to repeat messages with increased vocal intensity for better intelligibility, but became frustrated and verbally agitated with these cues/having to repeat himself. He also required Max A verbal and visual cueing for basic problem solving and recall to use remote to turn TV on/off and change channel. He was repeatedly pressing the nurse call button but became agitated with SLP's attempts to assist him with functional problem solving, stating,  "I know how to work the damn TV" (despite being unable to  do so without  help). Pt required Moderate cues for redirection to tasks in 3-4 minute intervals today. Pt left laying in bed with alarm set and needs within reach. Continue per current plan of care.         Pain Pain Assessment Pain Scale: Faces Pain Score: 0-No pain Faces Pain Scale: No hurt  Therapy/Group: Individual Therapy  [name] 04/14/2020, 9:38 AM

## 2020-04-14 NOTE — Progress Notes (Signed)
Occupational Therapy Session Note  Patient Details  Name: [name] MRN: [id number] Date of Birth: 1940/05/25  Today's Date: 04/14/2020 OT Individual Time: 1004-1030 OT Individual Time Calculation (min): 26 min    Short Term Goals: Week 6:  OT Short Term Goal 1 (Week 6): Continue working on established LTGs downgraded to mod assist overall.  Skilled Therapeutic Interventions/Progress Updates:    Pt received semi-reclined in bed with bed alarm engaged, initially agreeable to get dressed and toilet. Session focus on full-body dressing and toileting. Donned pants bed level with min A to start threading over LLE. Side roll >L with min A for R hand placement on rail, side lying > sit EOB with mod A to move BLE off bed + lift trunk. Doffed hospital gown with min A for initiation + to remove from waist. Donned long-sleeved shirt with mod A to thread LUE and pull over head. Donned B shoes with max A. Sit > stand with RW + min A to place L hand on orthotic. Pulled pants over L hip with min A.  Amb to bathroom with min A for balance. Completed clothing management with min A for initiation + balance in standing. Successful BM, completed posterior pericare in standing with overall max A for thoroughness, pt agitated and attempting to walk away from toilet without completing hygiene and pulling up pants. Amb back to bed with min A for balance. Stand > sit with min A to remove L hand from RW. Sit EOB > sup with close supervision. Washed hands at bed level with mod A for initiation + thoroughness of task 2/2 agitation. Pt cont to be confused + agitated, demanding that staff "hurry up" to assist him back to bed. Pt stated location incorrectly as [facility], but quickly reorientated to correct location when told he was in [facility]. C/o of neck pain upon sup > sitting EOB, complaints ceased with activity and repositioning. Left semi-reclined in bed with newspaper, bed alarm engaged, call bell in  reach.     Therapy Documentation Precautions:  Precautions Precautions: Fall Precaution Comments: L hemi, L inattention Restrictions Weight Bearing Restrictions: No  Pain: Pain Assessment Pain Scale: Faces Pain Score: 0-No pain Faces Pain Scale: Hurts little more Pain Type: Acute pain Pain Location: Neck   ADL: See Care Tool for more details.   Therapy/Group: Individual Therapy  Claudie Revering, MS, OTR/L 04/14/2020, 10:32 AM

## 2020-04-14 NOTE — Progress Notes (Signed)
Physical Therapy Session Note  Patient Details  Name: [name] MRN: [id number] Date of Birth: Nov 20, 1940  Today's Date: 04/14/2020 PT Missed Time: 45 Minutes Missed Time Reason: Patient unwilling to participate  Short Term Goals: Week 5:  PT Short Term Goal 1 (Week 5): STG= LTG based on ELOS  Skilled Therapeutic Interventions/Progress Updates:     Pt supine in bed. PT attempts gentle persuasion to participate in therapy but pt continually refuses. PT will follow up as appropriate.   Therapy Documentation Precautions:  Precautions Precautions: Fall Precaution Comments: L hemi, L inattention Restrictions Weight Bearing Restrictions: No General: PT Amount of Missed Time (min): 45 Minutes PT Missed Treatment Reason: Patient unwilling to participate   Therapy/Group: Individual Therapy  Beau Fanny, PT, DPT 04/14/2020, 3:58 PM

## 2020-04-14 NOTE — Progress Notes (Signed)
[name] PHYSICAL MEDICINE & REHABILITATION PROGRESS NOTE   Subjective/Complaints: Continues to have agitation. Will trial low dose Librium as a mood stabilizer He has no specific complaints this morning Was impulsive and got up to use bathroom on his own.    ROS: unable to assess due to mental status  Objective:   No results found. No results for input(s): WBC, HGB, HCT, PLT in the last 72 hours. No results for input(s): NA, K, CL, CO2, GLUCOSE, BUN, CREATININE, CALCIUM in the last 72 hours.  Intake/Output Summary (Last 24 hours) at 04/14/2020 1133 Last data filed at 04/14/2020 0900 Gross per 24 hour  Intake 640 ml  Output --  Net 640 ml    Physical Exam: Vital Signs Blood pressure (!) 156/79, pulse (!) 52, temperature 98.5 F (36.9 C), resp. rate 18, height 5\' 11"  (1.803 m), weight 66.1 kg, SpO2 99 %.  Gen: no distress, normal appearing HEENT: oral mucosa pink and moist, NCAT Cardio: Bradycardic Chest: normal effort, normal rate of breathing Abd: soft, non-distended Ext: no edema Skin: intact Psych: pleasant, normal affect Motor: Left upper extremity shoulder abduction 3-/5, distally 4/5- unchanged  Left lower extremity: 4 -to 4/5 proximal to distal  Assessment/Plan: 1. Functional deficits which require 3+ hours per day of interdisciplinary therapy in a comprehensive inpatient rehab setting.  Physiatrist is providing close team supervision and 24 hour management of active medical problems listed below.  Physiatrist and rehab team continue to assess barriers to discharge/monitor patient progress toward functional and medical goals  Care Tool:  Bathing    Body parts bathed by patient: Front perineal area,Buttocks   Body parts bathed by helper: Buttocks Body parts n/a: Right arm,Left arm,Chest,Abdomen,Right upper leg,Left upper leg,Right lower leg,Left lower leg,Face (pt declined this session)   Bathing assist Assist Level: Moderate Assistance - Patient 50 -  74% (sit<>stand)     Upper Body Dressing/Undressing Upper body dressing   What is the patient wearing?: Pull over shirt,Hospital gown only    Upper body assist Assist Level: Moderate Assistance - Patient 50 - 74%    Lower Body Dressing/Undressing Lower body dressing      What is the patient wearing?: Pants     Lower body assist Assist for lower body dressing: Minimal Assistance - Patient > 75%     Toileting Toileting Toileting Activity did not occur (Clothing management and hygiene only): N/A (no void or bm)  Toileting assist Assist for toileting: Maximal Assistance - Patient 25 - 49%     Transfers Chair/bed transfer  Transfers assist     Chair/bed transfer assist level: Minimal Assistance - Patient > 75% Chair/bed transfer assistive device:   Ambulation assist      Assist level: Minimal Assistance - Patient > 75% Assistive device: Walker-rolling Max distance: 167ft   Walk 10 feet activity   Assist     Assist level: Minimal Assistance - Patient > 75% Assistive device: Walker-rolling   Walk 50 feet activity   Assist Walk 50 feet with 2 turns activity did not occur: Safety/medical concerns (due to patient fatigue/weakness)  Assist level: Minimal Assistance - Patient > 75% Assistive device: Walker-rolling    Walk 150 feet activity   Assist Walk 150 feet activity did not occur: Safety/medical concerns (due to patient fatigue/weakness)  Assist level: Minimal Assistance - Patient > 75% Assistive device: Walker-rolling    Walk 10 feet on uneven surface  activity   Assist Walk 10 feet on uneven surfaces  activity did not occur: Safety/medical concerns (due to patient fatigue/weakness)         Wheelchair     Assist Will patient use wheelchair at discharge?: Yes Type of Wheelchair: Manual    Wheelchair assist level: Dependent - Patient 0% (TIS w/c) Max wheelchair distance: 150    Wheelchair 50 feet  with 2 turns activity    Assist        Assist Level: Dependent - Patient 0%   Wheelchair 150 feet activity     Assist      Assist Level: Dependent - Patient 0%    Medical Problem List and Plan: 1. 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    Continue CIR PT, OT, SLP  Qd therapy d/t tolerance/participation , would do well at SNF level to complete IV abx, [employer]er, retail indicates funding for CIR not SNF  , daughter is not able to care for him by herself, no other family, has LTC bed available in 2 wks , daughter is looking  Short term SNF bed.This may not not happen prior to tomorrow Pt is unable to manage finances or return to driving due to stroke   23/53- added seroquel 25 mg TID Prn and 50 mg QHS prn for agitation.                            12/20: added low dose librium for mood stabilization.    2.  Antithrombotics: -DVT/anticoagulation:  Pharmaceutical: Other (comment)--Eliquis.              -antiplatelet therapy: N/A--has been off ASA/Brilinta.  3. Chronic HA/Cervicalgia/Pain Management: Was on flexeril prn PTA. Will monitor N/A.   Continue K pad and cervical spine XR given new neck pain and TTP on cervical spine.  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  12/18- pt c/o pain in legs- wasn't specific about joint, etc- will give prn meds.              Monitor with increased exertion  Muscle rub ordered on 11/26   12/5 encouraged him to use kpad! 4. Mood: LCSW to follow for evaluation and support.              -antipsychotic agents: Monitor for now.   -Less somnolent with decrease in klonopin- continue to monitor.   12/18- was restless, almost agitated, trying to GET OOB per nursing yesterday- if  continues, might need to add something else prn.   12/19- added seroquel 25 mg TID Prn and 50 mg QHS prn- might need to come off to go to SNF< but nursing couldn't handle without it yesterday per them.  5. Neuropsych: This patient is not capable of making decisions on his own behalf. 6. Skin/Wound Care: Routine pressure relief measures. Protein supplement due to evidence of malnutrition.  7. Fluids/Electrolytes/Nutrition: Monitor I/Os.   9. HTN: Monitor BP educate on importance of compliance. SBP goal 130-150   Vitals:   04/13/20 2024 04/14/20 0523  BP: 132/68 (!) 156/79  Pulse: 61 (!) 52  Resp: 18 18  Temp:    SpO2: 95% 99%  HR again down to 40s and digoxin was held 12/17- digoxin level is low- will consult cardiology for input.  12/20: HR in 50s-60s. Digoxin resumed. Awaiting  cardiology input.  10. ICM/Heart failure with persistent apical thrombus: Monitor for signs and symptoms of fluid overload. Daily weights. Continue Eliquis 11. A fib: Monitor HR -continue Lanoxin and Eliquis.   See #9  12. Acute on chronic renal failure: Likely due to gastroenteritis.   Resolved - DC IV, creat should improve off metformin 13. Electrolytes abnormalities: Resolved post supplementation 14. Anemia: Recheck iron levels.                    Hemoglobin 9.7 on 11/22 , 9.3 on 12/6 15. T2DM with hyperglycemia: Hgb A1C-6.8.  probable neuropathy trial gabapentin low dose   Will monitor BS ac/hs.Add CM restrictions to diet.              Monitor with increased mobility CBG (last 3)  Recent Labs    04/13/20 1629 04/13/20 2109 04/14/20 0616  GLUCAP 156* 150* 96  may d/c metformin CBGs slightly elevated: continue to monitor.   12/20: CBGs 96-156: continue current regimen 16. 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 MSSA bacteremia  TEE showed no valve vegetations, no other sig structural  abnl in report cardiology to f/u with recs  afeb off IV cefazolin  , WBCs normal  19.  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 Urinary incontinence - mainly due to cognition , cont timed toileting  12/18- per nursing, they are trying- to do timed voiding- still wet- con't regimen     LOS: 40 days A FACE TO FACE EVALUATION WAS PERFORMED  Drema Pry Carmichael Burdette 04/14/2020, 11:33 AM

## 2020-04-14 NOTE — Progress Notes (Signed)
Patient ID: [name], male   DOB: [birth year], 79 y.o.   MRN: [id number]   Patients demographics sent to [facility]

## 2020-04-15 ENCOUNTER — Inpatient Hospital Stay (HOSPITAL_COMMUNITY): Payer: Medicare PPO | Admitting: Speech Pathology

## 2020-04-15 ENCOUNTER — Inpatient Hospital Stay (HOSPITAL_COMMUNITY): Payer: Medicare PPO | Admitting: Occupational Therapy

## 2020-04-15 ENCOUNTER — Inpatient Hospital Stay (HOSPITAL_COMMUNITY): Payer: Medicare PPO | Admitting: Physical Therapy

## 2020-04-15 LAB — BASIC METABOLIC PANEL
Anion gap: 10 (ref 5–15)
BUN: 28 mg/dL — ABNORMAL HIGH (ref 8–23)
CO2: 22 mmol/L (ref 22–32)
Calcium: 8.9 mg/dL (ref 8.9–10.3)
Chloride: 109 mmol/L (ref 98–111)
Creatinine, Ser: 1.27 mg/dL — ABNORMAL HIGH (ref 0.61–1.24)
GFR, Estimated: 58 mL/min — ABNORMAL LOW (ref >60)
Glucose, Bld: 124 mg/dL — ABNORMAL HIGH (ref 70–99)
Potassium: 4.2 mmol/L (ref 3.5–5.1)
Sodium: 141 mmol/L (ref 135–145)

## 2020-04-15 LAB — GLUCOSE, CAPILLARY
Glucose-Capillary: 98 mg/dL (ref 70–99)
Glucose-Capillary: 184 mg/dL — ABNORMAL HIGH (ref 70–99)
Glucose-Capillary: 127 mg/dL — ABNORMAL HIGH (ref 70–99)
Glucose-Capillary: 158 mg/dL — ABNORMAL HIGH (ref 70–99)

## 2020-04-15 LAB — CBC
HCT: 30.5 % — ABNORMAL LOW (ref 39.0–52.0)
Hemoglobin: 10.5 g/dL — ABNORMAL LOW (ref 13.0–17.0)
MCH: 31.8 pg (ref 26.0–34.0)
MCHC: 34.4 g/dL (ref 30.0–36.0)
MCV: 92.4 fL (ref 80.0–100.0)
Platelets: 212 10*3/uL (ref 150–400)
RBC: 3.3 MIL/uL — ABNORMAL LOW (ref 4.22–5.81)
RDW: 12 % (ref 11.5–15.5)
WBC: 5.8 10*3/uL (ref 4.0–10.5)
nRBC: 0 % (ref 0.0–0.2)

## 2020-04-15 MED ORDER — CHLORDIAZEPOXIDE HCL 5 MG PO CAPS
5.0000 mg | ORAL_CAPSULE | Freq: Three times a day (TID) | ORAL | Status: DC
  Administered 2020-04-15 – 2020-04-16 (×3): 5 mg via ORAL
  Filled 2020-04-15 (×3): qty 1

## 2020-04-15 NOTE — Progress Notes (Signed)
[name] PHYSICAL MEDICINE & REHABILITATION PROGRESS NOTE   Subjective/Complaints: Much more calm today- librium started yesterday Hg stable at 10.5 Cr trending up slightly, encourage hydration He has no complaints   ROS: unable to assess due to mental status  Objective:   No results found. Recent Labs    04/15/20 0400  WBC 5.8  HGB 10.5*  HCT 30.5*  PLT 212   Recent Labs    04/15/20 0400  NA 141  K 4.2  CL 109  CO2 22  GLUCOSE 124*  BUN 28*  CREATININE 1.27*  CALCIUM 8.9    Intake/Output Summary (Last 24 hours) at 04/15/2020 1521 Last data filed at 04/15/2020 1237 Gross per 24 hour  Intake 800 ml  Output --  Net 800 ml    Physical Exam: Vital Signs Blood pressure 100/69, pulse 64, temperature 98.7 F (37.1 C), resp. rate 18, height 5\' 11"  (1.803 m), weight 66.2 kg, SpO2 100 %.  Gen: no distress, normal appearing HEENT: oral mucosa pink and moist, NCAT Cardio: Reg rate Chest: normal effort, normal rate of breathing Abd: soft, non-distended Ext: no edema Skin: intact Psych: pleasant, normal affect Motor: Left upper extremity shoulder abduction 3-/5, distally 4/5- unchanged  Left lower extremity: 4 -to 4/5 proximal to distal  Assessment/Plan: 1. Functional deficits which require 3+ hours per day of interdisciplinary therapy in a comprehensive inpatient rehab setting.  Physiatrist is providing close team supervision and 24 hour management of active medical problems listed below.  Physiatrist and rehab team continue to assess barriers to discharge/monitor patient progress toward functional and medical goals  Care Tool:  Bathing    Body parts bathed by patient: Front perineal area,Buttocks   Body parts bathed by helper: Buttocks Body parts n/a: Right arm,Left arm,Chest,Abdomen,Right upper leg,Left upper leg,Right lower leg,Left lower leg,Face (pt declined this session)   Bathing assist Assist Level: Moderate Assistance - Patient 50 - 74%  (sit<>stand)     Upper Body Dressing/Undressing Upper body dressing   What is the patient wearing?: Pull over shirt,Hospital gown only    Upper body assist Assist Level: Moderate Assistance - Patient 50 - 74%    Lower Body Dressing/Undressing Lower body dressing      What is the patient wearing?: Pants     Lower body assist Assist for lower body dressing: Minimal Assistance - Patient > 75%     Toileting Toileting Toileting Activity did not occur (Clothing management and hygiene only): N/A (no void or bm)  Toileting assist Assist for toileting: Maximal Assistance - Patient 25 - 49%     Transfers Chair/bed transfer  Transfers assist     Chair/bed transfer assist level: Minimal Assistance - Patient > 75% Chair/bed transfer assistive device:   Ambulation assist      Assist level: Minimal Assistance - Patient > 75% Assistive device: Walker-rolling Max distance: 129ft   Walk 10 feet activity   Assist     Assist level: Minimal Assistance - Patient > 75% Assistive device: Walker-rolling   Walk 50 feet activity   Assist Walk 50 feet with 2 turns activity did not occur: Safety/medical concerns (due to patient fatigue/weakness)  Assist level: Minimal Assistance - Patient > 75% Assistive device: Walker-rolling    Walk 150 feet activity   Assist Walk 150 feet activity did not occur: Safety/medical concerns (due to patient fatigue/weakness)  Assist level: Minimal Assistance - Patient > 75% Assistive device: Walker-rolling    Walk 10 feet on uneven  surface  activity   Assist Walk 10 feet on uneven surfaces activity did not occur: Safety/medical concerns (due to patient fatigue/weakness)         Wheelchair     Assist Will patient use wheelchair at discharge?: Yes Type of Wheelchair: Manual    Wheelchair assist level: Dependent - Patient 0% (TIS w/c) Max wheelchair distance: 150    Wheelchair 50 feet with 2  turns activity    Assist        Assist Level: Dependent - Patient 0%   Wheelchair 150 feet activity     Assist      Assist Level: Dependent - Patient 0%    Medical Problem List and Plan: 1. 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    Continue CIR PT, OT, SLP  Qd therapy d/t tolerance/participation , would do well at SNF level to complete IV abx, Buyer, retail indicates funding for CIR not SNF  , daughter is not able to care for him by herself, no other family, has LTC bed available in 2 wks , daughter is looking  Short term SNF bed.This may not not happen prior to tomorrow Pt is unable to manage finances or return to driving due to stroke   19/50- added seroquel 25 mg TID Prn and 50 mg QHS prn for agitation.                            12/20: added low dose librium for mood stabilization.  12/21: mood is much better today.     2.  Antithrombotics: -DVT/anticoagulation:  Pharmaceutical: Other (comment)--Eliquis.              -antiplatelet therapy: N/A--has been off ASA/Brilinta.  3. Chronic HA/Cervicalgia/Pain Management: Was on flexeril prn PTA. Will monitor N/A.   Continue K pad and cervical spine XR given new neck pain and TTP on cervical spine.  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  12/18- pt c/o pain in legs- wasn't specific about joint, etc- will give prn meds.              Monitor with increased exertion  Muscle rub ordered on 11/26   12/5 encouraged him to use kpad! 4. Mood: LCSW to follow for evaluation and support.              -antipsychotic agents: Monitor for now.   -Less somnolent with decrease in klonopin- continue to monitor.   12/18- was restless, almost agitated, trying to GET OOB  per nursing yesterday- if continues, might need to add something else prn.   12/19- added seroquel 25 mg TID Prn and 50 mg QHS prn- might need to come off to go to SNF< but nursing couldn't handle without it yesterday per them.  5. Neuropsych: This patient is not capable of making decisions on his own behalf. 6. Skin/Wound Care: Routine pressure relief measures. Protein supplement due to evidence of malnutrition.  7. Fluids/Electrolytes/Nutrition: Monitor I/Os.   9. HTN: Monitor BP educate on importance of compliance. SBP goal 130-150   Vitals:   04/15/20 0542 04/15/20 1414  BP: 120/62 100/69  Pulse: 60 64  Resp: 19 18  Temp:  98.7 F (37.1 C)  SpO2: 100% 100%  12/21: HR much better controlled: continue  digoxin.  10. ICM/Heart failure with persistent apical thrombus: Monitor for signs and symptoms of fluid overload. Daily weights. Continue Eliquis 11. A fib: Monitor HR -continue Lanoxin and Eliquis.   See #9  12. Acute on chronic renal failure: Likely due to gastroenteritis.   Resolved - DC IV, creat should improve off metformin 13. Electrolytes abnormalities: Resolved post supplementation 14. Anemia: Recheck iron levels.                    Hemoglobin 9.7 on 11/22 , 9.3 on 12/6 15. T2DM with hyperglycemia: Hgb A1C-6.8.  probable neuropathy trial gabapentin low dose   Will monitor BS ac/hs.Add CM restrictions to diet.              Monitor with increased mobility CBG (last 3)  Recent Labs    04/14/20 2029 04/15/20 0601 04/15/20 1152  GLUCAP 118* 98 184*  may d/c metformin CBGs slightly elevated: continue to monitor.   12/21: CBGs 98-184: continue to monitor, 16. 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 MSSA bacteremia  TEE showed no valve vegetations, no other sig structural abnl in report cardiology to f/u with recs  afeb off IV cefazolin  , WBCs normal  19.   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 Urinary incontinence - mainly due to cognition , cont timed toileting  12/18- per nursing, they are trying- to do timed voiding- still wet- con't regimen     LOS: 41 days A FACE TO FACE EVALUATION WAS PERFORMED  Clint Bolder P Dason Mosley 04/15/2020, 3:21 PM

## 2020-04-15 NOTE — Progress Notes (Signed)
Speech Language Pathology Daily Session Note  Patient Details  Name: [name] MRN: [id number] Date of Birth: 1940/07/27  Today's Date: 04/15/2020 SLP Individual Time: 1030-1055 SLP Individual Time Calculation (min): 25 min  Short Term Goals: Week 6: SLP Short Term Goal 1 (Week 6): STG=LTG due to remaining length of stay; pt currently pending LTC placement  Skilled Therapeutic Interventions: Pt was seen for skilled ST targeting speech and swallowing goals. SLP facilitated session with upgraded trial of Dys 3 (mechanical soft) texture snack and thin coffee. No overt s/sx aspiration noted. Despite pt's quite large bites, his mastication and oral clearance was efficient with Supervision A verbal cues for use of compensatory strategies. Recommend continue current diet but will attempt trial tray or larger portion snack next session with anticipation of upgrade soon. Pt also engaged in simple conversation regarding topics in the newspaper he was reading with overall Min A verbal cues from clinician for intentional use of increased vocal intensity to increase his speech intelligibility to ~85% at the sentence and conversation levels - this is an improvement in comparison to previous sessions. Also of note, no verbal agitation today and pt reported he wants to have a "positive outlook" on life. Validation and encouragement for participation in the rest of this therapies provided. Pt left sitting upright in bed with alarm set and needs within reach. Continue per current plan of care.      Pain Pain Assessment Pain Scale: 0-10 Pain Score: 0-No pain  Therapy/Group: Individual Therapy  [name] 04/15/2020, 7:22 AM
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
## 2020-04-15 NOTE — Progress Notes (Signed)
Pt continues to attempt to get out of the bed. Pt disoriented to place/time/situation. Pt being verbally abusive to staff members. Pt educated on the importance of staying in the bed, pt continues to throw legs out of the bed, call staff with the call light, and throw things in the floor.

## 2020-04-15 NOTE — Progress Notes (Signed)
Physical Therapy Session Note  Patient Details  Name: [name] MRN: [id number] Date of Birth: [birth year]  Today's Date: 04/15/2020 PT Individual Time: 3709-6438 PT Individual Time Calculation (min): 30 min   Short Term Goals: Week 1:  PT Short Term Goal 1 (Week 1): Patient will be able to achieve supine > sit with CGA consistently PT Short Term Goal 1 - Progress (Week 1): Progressing toward goal PT Short Term Goal 2 (Week 1): Patient will transfer bed<> wc with MinA x1 at least 50% of the time PT Short Term Goal 2 - Progress (Week 1): Progressing toward goal PT Short Term Goal 3 (Week 1): Patient will ambulate >80f with MinA x2 with LRAD PT Short Term Goal 3 - Progress (Week 1): Progressing toward goal PT Short Term Goal 4 (Week 1): Patient will complete sit <> stand with no more than ModA x1 and LRAD PT Short Term Goal 4 - Progress (Week 1): Met Week 2:  PT Short Term Goal 1 (Week 2): Pt will perform supine<>sit with min assist PT Short Term Goal 1 - Progress (Week 2): Progressing toward goal PT Short Term Goal 2 (Week 2): Pt will perform sit<>stands using LRAD with min assist PT Short Term Goal 2 - Progress (Week 2): Progressing toward goal PT Short Term Goal 3 (Week 2): Pt will perform bed<>chair transfers using LRAD with min assist PT Short Term Goal 3 - Progress (Week 2): Progressing toward goal PT Short Term Goal 4 (Week 2): Pt will ambulate at least 530fusing LRAD with min assist of 1 PT Short Term Goal 4 - Progress (Week 2): Progressing toward goal Week 3:  PT Short Term Goal 1 (Week 3): STG= LTG based on ELOS PT Short Term Goal 1 - Progress (Week 3): Progressing toward goal  Skilled Therapeutic Interventions/Progress Updates:    pt received standing in room, alarm going off, ambulating in room without brief on. NCT following PT into room to assist. Pt reporting he needed to use the restroom and directed into restroom onto toilet mod A 20' with pt refusing walker use and  agitated with need of assistance. Once on toilet, pt refused assistance for donning brief or pants and stated he did not want help. [facility] for safety, pt [facility] and agreeable to PT to assist in brief and pants but only if he could remain in standing with grab bar use, max A for donning with pt's balance at min A for safety. Pt attempted pull pants over hips however unable to grasp with L hand. Pt returned to room at sink for hand washing mod A to complete and bedside min A for balance. CGA sit>supine and pt setup with 4 rails up, bed alarm set, All needs in reach and in good condition. Call light in hand.  Pt requested newspaper and PT retrieved this and pt reported feeling much better and agreeable to use call light for needs.   Therapy Documentation Precautions:  Precautions Precautions: Fall Precaution Comments: L hemi, L inattention Restrictions Weight Bearing Restrictions: No General:   Vital Signs:   Pain:   Mobility:   Locomotion :    Trunk/Postural Assessment :    Balance:   Exercises:   Other Treatments:      Therapy/Group: Individual Therapy  HaJunie Panning2/21/2021, 12:08 PM

## 2020-04-16 ENCOUNTER — Inpatient Hospital Stay (HOSPITAL_COMMUNITY): Payer: Medicare PPO | Admitting: Speech Pathology

## 2020-04-16 ENCOUNTER — Inpatient Hospital Stay (HOSPITAL_COMMUNITY): Payer: Medicare PPO | Admitting: Physical Therapy

## 2020-04-16 ENCOUNTER — Inpatient Hospital Stay (HOSPITAL_COMMUNITY): Payer: Medicare PPO | Admitting: Occupational Therapy

## 2020-04-16 LAB — GLUCOSE, CAPILLARY
Glucose-Capillary: 99 mg/dL (ref 70–99)
Glucose-Capillary: 104 mg/dL — ABNORMAL HIGH (ref 70–99)
Glucose-Capillary: 132 mg/dL — ABNORMAL HIGH (ref 70–99)
Glucose-Capillary: 140 mg/dL — ABNORMAL HIGH (ref 70–99)

## 2020-04-16 LAB — CBC
HCT: 33.1 % — ABNORMAL LOW (ref 39.0–52.0)
Hemoglobin: 10.6 g/dL — ABNORMAL LOW (ref 13.0–17.0)
MCH: 29.9 pg (ref 26.0–34.0)
MCHC: 32 g/dL (ref 30.0–36.0)
MCV: 93.5 fL (ref 80.0–100.0)
Platelets: UNDETERMINED 10*3/uL (ref 150–400)
RBC: 3.54 MIL/uL — ABNORMAL LOW (ref 4.22–5.81)
RDW: 11.9 % (ref 11.5–15.5)
WBC: 5.8 10*3/uL (ref 4.0–10.5)
nRBC: 0 % (ref 0.0–0.2)

## 2020-04-16 MED ORDER — QUETIAPINE FUMARATE 50 MG PO TABS
50.0000 mg | ORAL_TABLET | Freq: Every day | ORAL | Status: DC
  Administered 2020-04-16 (×2): 50 mg via ORAL
  Filled 2020-04-16: qty 1
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
## 2020-04-16 NOTE — Progress Notes (Signed)
[place] PHYSICAL MEDICINE & REHABILITATION PROGRESS NOTE   Subjective/Complaints:  Somnolent today, did not eat breakfast, started on chlordiazepoxide yesterday did not sleep well last night  ROS: unable to assess due to mental status  Objective:   No results found. Recent Labs    04/15/20 0400 04/16/20 0443  WBC 5.8 5.8  HGB 10.5* 10.6*  HCT 30.5* 33.1*  PLT 212 PLATELET CLUMPS NOTED ON SMEAR, UNABLE TO ESTIMATE   Recent Labs    04/15/20 0400  NA 141  K 4.2  CL 109  CO2 22  GLUCOSE 124*  BUN 28*  CREATININE 1.27*  CALCIUM 8.9    Intake/Output Summary (Last 24 hours) at 04/16/2020 0948 Last data filed at 04/16/2020 0100 Gross per 24 hour  Intake 570 ml  Output --  Net 570 ml    Physical Exam: Vital Signs Blood pressure 121/70, pulse (!) 47, temperature 98.3 F (36.8 C), resp. rate 18, height 5\' 11"  (1.803 m), weight 66.2 kg, SpO2 100 %.   General: No acute distress Mood and affect are appropriate Heart: Regular rate and rhythm no rubs murmurs or extra sounds Lungs: Clear to auscultation, breathing unlabored, no rales or wheezes Abdomen: Positive bowel sounds, soft nontender to palpation, nondistended Extremities: No clubbing, cyanosis, or edema   Motor: Left upper extremity shoulder abduction 3-/5, distally 4/5- unchanged  Left lower extremity: 4 -to 4/5 proximal to distal  Assessment/Plan: 1. Functional deficits which require 3+ hours per day of interdisciplinary therapy in a comprehensive inpatient rehab setting.  Physiatrist is providing close team supervision and 24 hour management of active medical problems listed below.  Physiatrist and rehab team continue to assess barriers to discharge/monitor patient progress toward functional and medical goals  Care Tool:  Bathing    Body parts bathed by patient: Chest,Abdomen,Face   Body parts bathed by helper: Left arm,Right arm Body parts n/a: Front perineal area,Buttocks,Right upper leg,Left  upper leg,Right lower leg,Left lower leg (pt declined washing these parts)   Bathing assist Assist Level: Maximal Assistance - Patient 24 - 49%     Upper Body Dressing/Undressing Upper body dressing   What is the patient wearing?: Pull over shirt    Upper body assist Assist Level: Maximal Assistance - Patient 25 - 49%    Lower Body Dressing/Undressing Lower body dressing      What is the patient wearing?: Pants     Lower body assist Assist for lower body dressing: Minimal Assistance - Patient > 75%     Toileting Toileting Toileting Activity did not occur (Clothing management and hygiene only): N/A (no void or bm)  Toileting assist Assist for toileting: Maximal Assistance - Patient 25 - 49%     Transfers Chair/bed transfer  Transfers assist     Chair/bed transfer assist level: Minimal Assistance - Patient > 75% Chair/bed transfer assistive device:   Ambulation assist      Assist level: Minimal Assistance - Patient > 75% Assistive device: Walker-rolling Max distance: 175ft   Walk 10 feet activity   Assist     Assist level: Minimal Assistance - Patient > 75% Assistive device: Walker-rolling   Walk 50 feet activity   Assist Walk 50 feet with 2 turns activity did not occur: Safety/medical concerns (due to patient fatigue/weakness)  Assist level: Minimal Assistance - Patient > 75% Assistive device: Walker-rolling    Walk 150 feet activity   Assist Walk 150 feet activity did not occur: Safety/medical concerns (due to patient  fatigue/weakness)  Assist level: Minimal Assistance - Patient > 75% Assistive device: Walker-rolling    Walk 10 feet on uneven surface  activity   Assist Walk 10 feet on uneven surfaces activity did not occur: Safety/medical concerns (due to patient fatigue/weakness)         Wheelchair     Assist Will patient use wheelchair at discharge?: Yes Type of Wheelchair: Manual     Wheelchair assist level: Dependent - Patient 0% (TIS w/c) Max wheelchair distance: 150    Wheelchair 50 feet with 2 turns activity    Assist        Assist Level: Dependent - Patient 0%   Wheelchair 150 feet activity     Assist      Assist Level: Dependent - Patient 0%    Medical Problem List and Plan: 1. 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    Continue CIR PT, OT, SLP  Qd therapy d/t tolerance/participation , would do well at SNF level to complete IV abx, insurance med director indicates funding for CIR not SNF  , daughter is not able to care for him by herself, no other family, has LTC bed available in ~4d Pt is unable to manage finances or return to driving due to stroke   79/89- added seroquel 25 mg TID Prn and 50 mg QHS prn for agitation.                            12/20: added low dose librium for mood stabilization.  12/21: mood is much better today.     2.  Antithrombotics: -DVT/anticoagulation:  Pharmaceutical: Other (comment)--Eliquis.              -antiplatelet therapy: N/A--has been off ASA/Brilinta.  3. Chronic HA/Cervicalgia/Pain Management: Was on flexeril prn PTA. Will monitor N/A.   Continue K pad and cervical spine XR given new neck pain and TTP on cervical spine.  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  12/18- pt c/o pain in legs- wasn't specific about joint, etc- will give prn meds.              Monitor with increased exertion  Muscle rub ordered on 11/26   12/5 encouraged him to use kpad! 4. Mood: LCSW to follow for evaluation and support.              -antipsychotic agents: Monitor for now.   -Less somnolent with decrease in klonopin- continue to monitor.   12/18- was restless,  almost agitated, trying to GET OOB per nursing yesterday- if continues, might need to add something else prn.   12/19- added seroquel 25 mg TID Prn and 50 mg QHS prn- might need to come off to go to SNF< but nursing couldn't handle without it yesterday per them.  5. Neuropsych: This patient is not capable of making decisions on his own behalf. 6. Skin/Wound Care: Routine pressure relief measures. Protein supplement due to evidence of malnutrition.  7. Fluids/Electrolytes/Nutrition: Monitor I/Os.   9. HTN: Monitor BP educate on importance of compliance. SBP goal 130-150   Vitals:   04/16/20 0541 04/16/20 0805  BP: 121/70   Pulse: (!) 46 (!) 47  Resp: 18   Temp: 98.3 F (36.8 C)   SpO2: 100%  12/21: HR much better controlled: continue digoxin.  10. ICM/Heart failure with persistent apical thrombus: Monitor for signs and symptoms of fluid overload. Daily weights. Continue Eliquis 11. A fib: Monitor HR -continue Lanoxin and Eliquis.   12. Acute on chronic renal failure: Likely due to gastroenteritis.   Resolved - DC IV, creat should improve off metformin 13. Electrolytes abnormalities: Resolved post supplementation 14. Anemia: Recheck iron levels.                    Hemoglobin 9.7 on 11/22 , 9.3 on 12/6 15. T2DM with hyperglycemia: Hgb A1C-6.8.  probable neuropathy trial gabapentin low dose   Will monitor BS ac/hs.Add CM restrictions to diet.              Monitor with increased mobility CBG (last 3)  Recent Labs    04/15/20 1644 04/15/20 2115 04/16/20 0613  GLUCAP 127* 158* 99  Well controlled off medication 16. 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 MSSA bacteremia  TEE showed no valve vegetations, no other sig structural abnl in report cardiology to f/u with recs  afeb off IV cefazolin  , WBCs normal  19.  Cognitive deficits from CVA +/- encephalopathy from  bacteremia  Trial low dose Klonopin prn, used only intermittently  12/17: klonopin has been given every day and he is quite somnolent- will d/c  20. Urinary incontinence - mainly due to cognition , cont timed toileting      LOS: 42 days A FACE TO FACE EVALUATION WAS PERFORMED  Erick Colace 04/16/2020, 9:48 AM

## 2020-04-16 NOTE — Progress Notes (Signed)
Pt has not slept through out the night. Pt having hallucinations and continues to call staff to the room. This nurse sat in the pts room to reassure him there was "no cats and dogs" in the room. Pt attempted to get out of the bed numerous times. Pt shows no understanding on the safety education provided.
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
## 2020-04-16 NOTE — Progress Notes (Signed)
Physical Therapy Weekly Progress Note  Patient Details  Name: [name] MRN: [id number] Date of Birth: [birth year]  Beginning of progress report period: April 07, 2020 End of progress report period: April 16, 2020   Patient has met 0 of 1 short term goals.  Patient remains grossly MinA/ModA depending on the effort that patient puts forth with functional mobility. He has made limited progress toward his LTGs due to poor participation, limited activity tolerance, decreasing effort during therapeutic tasks.   Patient continues to demonstrate the following deficits muscle weakness, decreased cardiorespiratoy endurance, abnormal tone, unbalanced muscle activation, decreased coordination and decreased motor planning, decreased midline orientation, decreased attention to left and decreased motor planning, decreased initiation, decreased attention, decreased awareness, decreased problem solving, decreased safety awareness and decreased memory and decreased sitting balance, decreased standing balance, decreased postural control, hemiplegia and decreased balance strategies and therefore will continue to benefit from skilled PT intervention to increase functional independence with mobility.  Patient progressing toward long term goals..  Continue plan of care.  PT Short Term Goals Week 5:  PT Short Term Goal 1 (Week 5): STG= LTG based on ELOS PT Short Term Goal 1 - Progress (Week 5): Progressing toward goal Week 6:  PT Short Term Goal 1 (Week 6): STG= LTG based on ELOS   Therapy Documentation Precautions:  Precautions Precautions: Fall Precaution Comments: L hemi, L inattention Restrictions Weight Bearing Restrictions: No  Debbora Dus 04/16/2020, 7:37 AM

## 2020-04-16 NOTE — Progress Notes (Signed)
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  Ongoing  GOAL:   Patient will meet greater than or equal to 90% of their needs  Progressing  MONITOR:   PO intake,Supplement acceptance,Diet advancement,Labs,Weight trends,Skin  REASON FOR ASSESSMENT:   Consult Diet education  ASSESSMENT:   79 year old male with PMH of T2DM, HTN, CAD s/p CABG in 2020, ICM, cardioembolic CVA, COPD who was admitted on 02/28/20 after being found on the floor with left hemiparesis, facial droop, and multiple abrasions. 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 Hospital course further complicated by post-stroke dysphagia. Pt started on a dysphagia 1 diet with nectar-thick liquids. Admitted to CIR on 11/10.  Plan remains for pt to d/c to SNF once bed available.  Pt currently on a dysphagia 2 diet with thin liquids. All meal completions have been documented as 100%. Pt continues to work with SLP and plan for possible diet advancement to dysphagia 3.  Noted Ensure and ProSource supplements discontinued. PO intake at meals is good; will monitor for need to reorder supplements.  CIR admit weight: 65.4 kg Current weight:66.2 kg  Medications reviewed and include: SSI, Juven, vitamin C 250 mg daily   Labs reviewed: BUN 28, creatinine 1.27 CBG's: 99-184 x 24  hours  Diet Order:   Diet Order            DIET DYS 2 Room service appropriate? Yes; Fluid consistency: Thin  Diet effective now                 EDUCATION NEEDS:   Not appropriate for education at this time  Skin:  Skin Assessment: Skin Integrity Issues: Other: non-pressure wound to left shoulder, right knee  Last BM:  04/14/20  Height:   Ht Readings from Last 1 Encounters:  03/10/20 5\' 11"  (1.803 m)    Weight:   Wt Readings from Last 1 Encounters:  04/15/20 66.2 kg    BMI:  Body mass index is 20.36 kg/m.  Estimated Nutritional Needs:   Kcal:  1800-2000  Protein:  85-100 grams  Fluid:  1.8-2.0 L    04/17/20, MS, RD, LDN Inpatient Clinical Dietitian Please see [facility] for contact information.

## 2020-04-16 NOTE — Progress Notes (Signed)
Patient ID: [name], male   DOB: 1940-11-03, 79 y.o.   MRN: [id number] Team Conference Report to Patient/Family  Team Conference discussion was reviewed with the patient and caregiver, including goals, any changes in plan of care and target discharge date.  Patient and caregiver express understanding and are in agreement.  The patient has a target discharge date of  (LTC facility pending bed availability).  Andria Rhein 04/16/2020, 11:20 AM

## 2020-04-16 NOTE — Patient Care Conference (Signed)
Inpatient RehabilitationTeam Conference and Plan of Care Update Date: 04/16/2020   Time: 10:53 AM    Patient Name: [name]      Medical Record Number: [id number]  Date of Birth: 03-06-1941 Sex: Male         Room/Bed: 4W08C/4W08C-01 Payor Info: Payor: [employer] MEDICARE / Plan: [employer] MEDICARE CHOICE PPO / Product Type: *No Product type* /    Admit Date/Time:  03/05/2020  3:24 PM  Primary Diagnosis:  Acute right MCA stroke [facility])  Hospital Problems: Principal Problem:   Acute right MCA stroke (HCC) Active Problems:   Type II diabetes mellitus (HCC)   MSSA bacteremia   Protein-calorie malnutrition, severe   Neck pain   Bacteremia   Dysphagia, post-stroke   Cognitive deficit, post-stroke    Expected Discharge Date: Expected Discharge Date:  (LTC facility pending bed availability)  Team Members Present: Physician leading conference: Dr. Claudette Laws Care Coodinator Present: Chana Bode, RN, BSN, CRRN;Christina Matinecock, BSW Nurse Present: Margot Ables, LPN PT Present: Merry Lofty, PT OT Present: Perrin Maltese, OT SLP Present: Suzzette Righter, CF-SLP PPS Coordinator present : Fae Pippin, [place], PT     Current Status/Progress Goal Weekly Team Focus  Bowel/Bladder   cont/inc LBM 12/20  regain continence  timed toileting   Swallow/Nutrition/ Hydration   Dys 2 textures, thin liquids, left pocketing improving  Min A  carryover swallow strategies, Dys 3 trials and advanvcement   ADL's   min A for stand to sit, stand pivot with or without RW, mod to max A for full-body dressing/bathing, min A for seated grooming, and self-feeding, Assist levels may vary depending on agitation level and safety awareness. Brunnstrom levels for LUE: 2-3, L hand: 3.  sup to mod A  functional cognition, transfer training, balance retrianing, family edu, LUE NMR, DME ed   Mobility   varying levels of participation in therapy resulting in varying levels of assist required for  mobility tasks, perseverates on returning to bed, CGA/MinA bed mobility, mod/modA sit<> stand and stand pivot transfers using RW, min/modA gait up to 162ft using RW- patient remains at same level as previous weeks  min/mod assist overall  increased participation, transfer training, dynamic sitting/standing balance, bed mobility, dc planning   Communication   Dysarthria, Min-Max A increased vocal intensity depending on his mood/participation  Mod A  carryover and intentional use of compensatory strategies for speech   Safety/Cognition/ Behavioral Observations  ~Mod A depending on mood and participation  Min-Mod (downgraded)  attention, safety awareness, recall with strategies, basic familiar problem solving   Pain   generalized pain relieved with tylenol  free of pain  assess qshift and prn   Skin   Abrasion to L shoulder and bil knees  pt free from further breakdown or infection  assess qshift and prn     Discharge Planning:  Daughter plans to discharge patient to LT Care[facility] in [place]. Working on paperwork, pending admission   Team Discussion: Tolerating daily therapy schedule better. Reported headaches and noted insomnia. MD to adjust medications. Continues to have episodes of incontinence. Progress limited by inconsistent participation, perseveration, internal distractions and willingness to participate during sessions.  Patient on target to meet rehab goals: yes, currently min assist for upper body and mod assist for lower body care with mod assist for tranfers and cam ambulate  With min - mod assist. Min-mod assist goals set  *See Care Plan and progress notes for long and short-term goals.   Revisions to Treatment Plan:  Dysphagia 3 advancement trials Teaching Needs: Transfers, toileting, medications, etc.  Current Barriers to Discharge: Decreased caregiver support and Insurance for SNF coverage  Possible Resolutions to Barriers: LTC bed pending     Medical Summary Current  Status: Fluctuating level of alertness intermittent mild agitation mainly trying to get out of bed at times.  Cooperates with therapy intermittently  Barriers to Discharge: Insurance for SNF coverage   Possible Resolutions to [employer] Focus: Awaiting nursing home bed   Continued Need for Acute Rehabilitation Level of Care: The patient requires daily medical management by a physician with specialized training in physical medicine and rehabilitation for the following reasons: Direction of a multidisciplinary physical rehabilitation program to maximize functional independence : Yes Medical management of patient stability for increased activity during participation in an intensive rehabilitation regime.: Yes Analysis of laboratory values and/or radiology reports with any subsequent need for medication adjustment and/or medical intervention. : Yes   I attest that I was present, lead the team conference, and concur with the assessment and plan of the team.   Chana Bode B 04/16/2020, 2:05 PM

## 2020-04-16 NOTE — Progress Notes (Signed)
Patient ID: [name], male   DOB: October 06, 1940, 79 y.o.   MRN: [id number]  SW received call from "[facility] personal LTC home requesting additional information on patient. SW emailed information. Currently working on medically evaluation form to submit back to facility. Will update team on decision once determination is made.   [place], [place] [phone]

## 2020-04-16 NOTE — Progress Notes (Signed)
Speech Language Pathology Daily Session Note  Patient Details  Name: CEFERINO LANG MRN: [id number] Date of Birth: Jan 08, 1941  Today's Date: 04/16/2020 SLP Individual Time: 9741-6384 SLP Individual Time Calculation (min): 15 min  Short Term Goals: Week 6: SLP Short Term Goal 1 (Week 6): STG=LTG due to remaining length of stay; pt currently pending LTC placement  Skilled Therapeutic Interventions: Pt was seen for skilled ST targeting cognition. Although SLP planned to provide trial breakfast tray of dysphagia 3 textures, pt unable to arouse appropriately for PO intake this morning. He required Max A multimodal interventions to arouse for brief intervals of time and sustain attention, as well as Mod A verbal and visual cues for orientation to time and place. Eventually he ceased verbal responses to SLP in attempts to engage in functional conversation and tasks. Therefore, he missed last 15 minutes of skilled ST session. Continue per current plan of care.          Pain Pain Assessment Pain Scale: Faces Faces Pain Scale: No hurt   Therapy/Group: Individual Therapy  [name] 04/16/2020, 7:25 AM

## 2020-04-17 ENCOUNTER — Inpatient Hospital Stay (HOSPITAL_COMMUNITY): Payer: Medicare PPO | Admitting: Occupational Therapy

## 2020-04-17 ENCOUNTER — Inpatient Hospital Stay (HOSPITAL_COMMUNITY): Payer: Medicare PPO | Admitting: Physical Therapy

## 2020-04-17 ENCOUNTER — Inpatient Hospital Stay (HOSPITAL_COMMUNITY): Payer: Medicare PPO | Admitting: Speech Pathology

## 2020-04-17 LAB — CBC
HCT: 30.6 % — ABNORMAL LOW (ref 39.0–52.0)
Hemoglobin: 10.5 g/dL — ABNORMAL LOW (ref 13.0–17.0)
MCH: 31.6 pg (ref 26.0–34.0)
MCHC: 34.3 g/dL (ref 30.0–36.0)
MCV: 92.2 fL (ref 80.0–100.0)
Platelets: 217 10*3/uL (ref 150–400)
RBC: 3.32 MIL/uL — ABNORMAL LOW (ref 4.22–5.81)
RDW: 11.9 % (ref 11.5–15.5)
WBC: 5 10*3/uL (ref 4.0–10.5)
nRBC: 0 % (ref 0.0–0.2)

## 2020-04-17 LAB — GLUCOSE, CAPILLARY
Glucose-Capillary: 106 mg/dL — ABNORMAL HIGH (ref 70–99)
Glucose-Capillary: 117 mg/dL — ABNORMAL HIGH (ref 70–99)
Glucose-Capillary: 188 mg/dL — ABNORMAL HIGH (ref 70–99)

## 2020-04-17 MED ORDER — TUBERCULIN PPD 5 UNIT/0.1ML ID SOLN
5.0000 [IU] | Freq: Once | INTRADERMAL | Status: AC
  Administered 2020-04-18: 5 [IU] via INTRADERMAL
  Filled 2020-04-17: qty 0.1

## 2020-04-17 MED ORDER — QUETIAPINE FUMARATE 50 MG PO TABS
100.0000 mg | ORAL_TABLET | Freq: Every day | ORAL | Status: DC
  Administered 2020-04-17 – 2020-04-23 (×7): 100 mg via ORAL
  Filled 2020-04-17 (×7): qty 2

## 2020-04-17 MED ORDER — CITALOPRAM HYDROBROMIDE 10 MG PO TABS
10.0000 mg | ORAL_TABLET | Freq: Every day | ORAL | Status: DC
  Administered 2020-04-17 – 2020-04-24 (×7): 10 mg via ORAL
  Filled 2020-04-17 (×9): qty 1

## 2020-04-17 NOTE — Progress Notes (Signed)
Pt continues to attempt to get out of the bed. Pt has been redirected and reoriented, but continues to attempt to get up. PRNs given to the pt, but not effective.

## 2020-04-17 NOTE — Progress Notes (Signed)
Occupational Therapy Session Note  Patient Details  Name: [name] MRN: [id number] Date of Birth: 03/02/1941  Today's Date: 04/17/2020 OT Individual Time: 8115-7262 OT Individual Time Calculation (min): 26 min    Short Term Goals: Week 5:  OT Short Term Goal 1 (Week 5): Continue working on established LTGs downgraded to mod assist overall. OT Short Term Goal 1 - Progress (Week 5): Not met  Skilled Therapeutic Interventions/Progress Updates:    Pt in bed during session with focus on LUE AAROM and stretching.  Pt with increased pain with all movement of the arm throughout session.  He maintains eyes closed, unless cued to open them, and then only keeps them open for brief periods of time.  Had him transfer to right sidelying with completion of scapular mobilizations with elevation, depression, and adduction.  Also completed AAROM in sidelying for shoulder flexion to approximately 90-100 degrees while facilitation scapular depression with upward rotation.  Pt with increased pain at 90-100 degrees.  Had him transition to supine to work on stretching of the internal rotators and facilitation slight shoulder abduction.  Pt with increased pain with all attempts to abduct the shoulder and was resistant to therapist completing stretch to the pectoral.  Had him flex his elbow to 90 degrees with assistance and work on actively externally rotation his arm to target.  He was able to actively complete 10-15 degrees of external rotation this way.  With small movements, therapist was able to abduct arm to approximately 50 degrees.  Pt became increasing agitated with gently stretching and needed mod re-direction to continue working throughout session.  He was oriented to place and situation this session when asked, but as stated earlier, his awareness is limited and he would not maintain sustained attention to this task.  Pt was left in the bed with the call button and phone in reach and bed alarm in place.     Therapy Documentation Precautions:  Precautions Precautions: Fall Precaution Comments: L hemi, L inattention Restrictions Weight Bearing Restrictions: No  Pain: Pain Assessment Pain Scale: Faces Pain Score: 0-No pain Faces Pain Scale: Hurts a little bit Pain Type: Acute pain Pain Location: Arm Pain Orientation: Left Pain Descriptors / Indicators: Grimacing;Discomfort Pain Onset: With Activity Pain Intervention(s): Repositioned Multiple Pain Sites: No ADL: See Care Tool Section for some details of mobility and selfcare  Therapy/Group: Individual Therapy  Moustafa Mossa,Fines OTR/L 04/17/2020, 4:10 PM

## 2020-04-17 NOTE — Progress Notes (Signed)
Speech Language Pathology Daily Session Note  Patient Details  Name: [name] MRN: [id number] Date of Birth: July 10, 1940  Today's Date: 04/17/2020 SLP Individual Time: 1300-1320 SLP Individual Time Calculation (min): 20 min  Short Term Goals: Week 6: SLP Short Term Goal 1 (Week 6): STG=LTG due to remaining length of stay; pt currently pending LTC placement  Skilled Therapeutic Interventions:   Patient seen for skilled ST session to address cognitive-linguistic and speech goals. He was able to state one speech strategy "to slow down". He was oriented to self, stated place as "medical facility" but that it was "in [facility]" (its actually in [place]). He stated month as January but did acknowledge with modA cues from SLP that Christmas holiday has not occurred yet. Patient unable to transition between topics of talking about changes in him from stroke, and talking about current events. He perseverated on saying "trouble organizing our thoughts" but it was unclear if he was referring to his own difficulties as he was not able to clarify. Patient started to close eyes and fall asleep. Patient continues to benefit from skilled SLP intervention to maximize speech-language, cognitive and swallow function prior to discharge.   Pain Pain Assessment Pain Scale: 0-10 Pain Score: 0-No pain  Therapy/Group: Individual Therapy   Angela Nevin, MA, CCC-SLP Speech Therapy

## 2020-04-17 NOTE — Progress Notes (Addendum)
Patient ID: [name], male   DOB: August 14, 1940, 79 y.o.   MRN: [id number]   SW followed up with [facility]. SW informed facility willing to accept patient. SW informed Cephus Slater of patients current LOC, behaviors and mobility. Gabriel Rung feels like they would be able to handle care. SW informed level entry location, 24/7care/survallience. SW will have HB delivered to facility, RW here at facility, [facility] pending. Sw will follow up with daughter about scheduling family education for car tranfers.  Patient will require a TB test and COVID test, patient potential to discharge on Monday.   [place], [place] [phone]

## 2020-04-17 NOTE — Progress Notes (Signed)
Patient ID: [name], male   DOB: 17-Oct-1940, 79 y.o.   MRN: [id number]   [facility] unable to deliver patient DME due to new service area. Patient orders and packet sent to [facility] pharmacy by [facility] staff.  [place], [place] [phone]

## 2020-04-17 NOTE — Progress Notes (Signed)
Physical Therapy Session Note  Patient Details  Name: [name] MRN: [id number] Date of Birth: [birth year]  Today's Date: 04/17/2020 PT Missed Time: 30 Minutes Missed Time Reason: Patient unwilling to participate  Short Term Goals: Week 6:  PT Short Term Goal 1 (Week 6): STG= LTG based on ELOS  Pt received R sidelying asleep, in bed and upon attempts at gentle verbal and tactile stimulus to awaken pt he states "quit, quit." Therapist noted pt and bed to be soiled in urine - made pt aware of this and he replies "no" when asked to get OOB and clean up despite multiple gentle attempts of encouragement. MD in/out for morning assessment and therapist discussed if pt may be getting nights and days swapped - therapist opened windows and turned on lights. Pt left R sidelying in bed falling back asleep - notified NT of pt's need for assistance with cleaning up and changing bed linen. Missed 30 minutes of skilled physical therapy.   Ginny Forth , PT, DPT, CSRS  04/17/2020, 7:55 AM

## 2020-04-17 NOTE — Progress Notes (Signed)
[place] PHYSICAL MEDICINE & REHABILITATION PROGRESS NOTE   Subjective/Complaints:  Decreased participation in PT, OT, currently receiving 1/2 hr TID Pt was up last noc , sleepy this am   ROS: unable to assess due to mental status  Objective:   No results found. Recent Labs    04/16/20 0443 04/17/20 0438  WBC 5.8 5.0  HGB 10.6* 10.5*  HCT 33.1* 30.6*  PLT PLATELET CLUMPS NOTED ON SMEAR, UNABLE TO ESTIMATE 217   Recent Labs    04/15/20 0400  NA 141  K 4.2  CL 109  CO2 22  GLUCOSE 124*  BUN 28*  CREATININE 1.27*  CALCIUM 8.9    Intake/Output Summary (Last 24 hours) at 04/17/2020 0915 Last data filed at 04/17/2020 0700 Gross per 24 hour  Intake 680 ml  Output --  Net 680 ml    Physical Exam: Vital Signs Blood pressure (!) 114/56, pulse 81, temperature 98 F (36.7 C), resp. rate 16, height 5\' 11"  (1.803 m), weight 66.6 kg, SpO2 99 %.   General: No acute distress Mood and affect are appropriate Heart: Regular rate and rhythm no rubs murmurs or extra sounds Lungs: Clear to auscultation, breathing unlabored, no rales or wheezes Abdomen: Positive bowel sounds, soft nontender to palpation, nondistended Extremities: No clubbing, cyanosis, or edema Skin: No evidence of breakdown, no evidence of rash Motor: Left upper extremity shoulder abduction 3-/5, distally 4/5- unchanged  Left lower extremity: 4 -to 4/5 proximal to distal  Assessment/Plan: 1. Functional deficits which require 3+ hours per day of interdisciplinary therapy in a comprehensive inpatient rehab setting.  Physiatrist is providing close team supervision and 24 hour management of active medical problems listed below.  Physiatrist and rehab team continue to assess barriers to discharge/monitor patient progress toward functional and medical goals  Care Tool:  Bathing    Body parts bathed by patient: Chest,Abdomen,Face   Body parts bathed by helper: Left arm,Right arm Body parts n/a: Front  perineal area,Buttocks,Right upper leg,Left upper leg,Right lower leg,Left lower leg (pt declined washing these parts)   Bathing assist Assist Level: Maximal Assistance - Patient 24 - 49%     Upper Body Dressing/Undressing Upper body dressing   What is the patient wearing?: Pull over shirt    Upper body assist Assist Level: Maximal Assistance - Patient 25 - 49%    Lower Body Dressing/Undressing Lower body dressing      What is the patient wearing?: Pants,Incontinence brief     Lower body assist Assist for lower body dressing: Maximal Assistance - Patient 25 - 49%     Toileting Toileting Toileting Activity did not occur (Clothing management and hygiene only): N/A (no void or bm)  Toileting assist Assist for toileting: Minimal Assistance - Patient > 75%     Transfers Chair/bed transfer  Transfers assist     Chair/bed transfer assist level: Minimal Assistance - Patient > 75% Chair/bed transfer assistive device:   Ambulation assist      Assist level: Minimal Assistance - Patient > 75% Assistive device: Hand held assist Max distance: 10'   Walk 10 feet activity   Assist     Assist level: Minimal Assistance - Patient > 75% Assistive device: Walker-rolling   Walk 50 feet activity   Assist Walk 50 feet with 2 turns activity did not occur: Safety/medical concerns (due to patient fatigue/weakness)  Assist level: Minimal Assistance - Patient > 75% Assistive device: Walker-rolling    Walk 150 feet activity  Assist Walk 150 feet activity did not occur: Safety/medical concerns (due to patient fatigue/weakness)  Assist level: Minimal Assistance - Patient > 75% Assistive device: Walker-rolling    Walk 10 feet on uneven surface  activity   Assist Walk 10 feet on uneven surfaces activity did not occur: Safety/medical concerns (due to patient fatigue/weakness)         Wheelchair     Assist Will patient use  wheelchair at discharge?: Yes Type of Wheelchair: Manual    Wheelchair assist level: Dependent - Patient 0% (TIS w/c) Max wheelchair distance: 150    Wheelchair 50 feet with 2 turns activity    Assist        Assist Level: Dependent - Patient 0%   Wheelchair 150 feet activity     Assist      Assist Level: Dependent - Patient 0%    Medical Problem List and Plan: 1. 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    Continue CIR PT, OT, SLP  Qd therapy d/t tolerance/participation , would do well at SNF level to complete IV abx, insurance med director indicates funding for CIR not SNF  , daughter is not able to care for him by herself, no other family, has LTC bed available in ~4d Pt is unable to manage finances or return to driving due to stroke   05/39- added seroquel 25 mg TID Prn and 50 mg QHS prn for agitation.                            12/20: added low dose librium for mood stabilization.  12/21: mood is much better today.     2.  Antithrombotics: -DVT/anticoagulation:  Pharmaceutical: Other (comment)--Eliquis.              -antiplatelet therapy: N/A--has been off ASA/Brilinta.  3. Chronic HA/Cervicalgia/Pain Management: Was on flexeril prn PTA. Will monitor N/A.   Continue K pad and cervical spine XR given new neck pain and TTP on cervical spine.  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  12/18- pt c/o pain in legs- wasn't specific about joint, etc- will give prn meds.              Monitor with increased exertion  Muscle rub ordered on 11/26   12/5 encouraged him to use kpad! 4. Mood: LCSW to follow for evaluation and support.              -antipsychotic agents: Monitor for now.   -Less somnolent with  decrease in klonopin- continue to monitor.   12/18- was restless, almost agitated, trying to GET OOB per nursing yesterday- if continues, might need to add something else prn.   12/19- added seroquel 25 mg TID Prn and 50 mg QHS prn- might need to come off to go to SNF< but nursing couldn't handle without it yesterday per them.  5. Neuropsych: This patient is not capable of making decisions on his own behalf. 6. Skin/Wound Care: Routine pressure relief measures. Protein supplement due to evidence of malnutrition.  7. Fluids/Electrolytes/Nutrition: Monitor I/Os.   9. HTN: Monitor BP educate on importance of compliance. SBP goal 130-150   Vitals:   04/17/20 0604 04/17/20 0815  BP: (!) 114/56   Pulse: (!) 51 81  Resp: 16  Temp:    SpO2: 99%   12/21: HR much better controlled: continue digoxin.  10. ICM/Heart failure with persistent apical thrombus: Monitor for signs and symptoms of fluid overload. Daily weights. Continue Eliquis 11. A fib: Monitor HR -continue Lanoxin and Eliquis.   12. Acute on chronic renal failure: Likely due to gastroenteritis.   Resolved - DC IV, creat should improve off metformin 13. Electrolytes abnormalities: Resolved post supplementation 14. Anemia: Recheck iron levels.                    Hemoglobin 9.7 on 11/22 , 9.3 on 12/6 15. T2DM with hyperglycemia: Hgb A1C-6.8.  probable neuropathy trial gabapentin low dose   Will monitor BS ac/hs.Add CM restrictions to diet.              Monitor with increased mobility CBG (last 3)  Recent Labs    04/16/20 1741 04/16/20 2115 04/17/20 0632  GLUCAP 132* 140* 106*  Well controlled off medication 16. 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 MSSA bacteremia  TEE showed no valve vegetations, no other sig structural abnl in report cardiology to f/u with recs  afeb off IV cefazolin  , WBCs normal  19.   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 Urinary incontinence - mainly due to cognition , cont timed toileting      LOS: 43 days A FACE TO FACE EVALUATION WAS PERFORMED  Erick Colace 04/17/2020, 9:15 AM

## 2020-04-18 ENCOUNTER — Inpatient Hospital Stay (HOSPITAL_COMMUNITY): Payer: Medicare PPO

## 2020-04-18 ENCOUNTER — Inpatient Hospital Stay (HOSPITAL_COMMUNITY): Payer: Medicare PPO | Admitting: Speech Pathology

## 2020-04-18 ENCOUNTER — Inpatient Hospital Stay (HOSPITAL_COMMUNITY): Payer: Medicare PPO | Admitting: Occupational Therapy

## 2020-04-18 LAB — GLUCOSE, CAPILLARY
Glucose-Capillary: 115 mg/dL — ABNORMAL HIGH (ref 70–99)
Glucose-Capillary: 135 mg/dL — ABNORMAL HIGH (ref 70–99)
Glucose-Capillary: 185 mg/dL — ABNORMAL HIGH (ref 70–99)
Glucose-Capillary: 201 mg/dL — ABNORMAL HIGH (ref 70–99)

## 2020-04-18 NOTE — Progress Notes (Signed)
Occupational Therapy Session Note  Patient Details  Name: [name] MRN: [id number] Date of Birth: 04/24/1941  Today's Date: 04/18/2020 OT Individual Time: 0350-0938 OT Individual Time Calculation (min): 28 min    Short Term Goals: Week 6:  OT Short Term Goal 1 (Week 6): Continue working on established LTGs downgraded to mod assist overall.  Skilled Therapeutic Interventions/Progress Updates:    Pt received sup in bed, agreeable to therapy. Pt able to state today was Christmas Eve when verbally cued it was a holiday, incorrectly stated that today was Saturday. Side roll > L with min A to initiate roll, side lying > sit EOB with max A to lift trunk + move BLE off bed. C/o of neck, back, and L shoulder pain, did not quantify. Donned hospital gown around back with max A to thread BUE and adjust around back. Worked on func mobility to target improved activity tolerance + for a change of scenery for improved affect. Sit > stand from EOB at mod assist, amb 232 feet then req seated rest break of 5 min, amb another 137 ft back to room all with total A + 2 for B hand held assist (pt 70%). Stand > sit with min A for controlled descent. Sit EOB > sup with min A to bring LLE on bed. Left with HOB at 30, bed alarm engaged, four bed rails up for safety, call bell in reach, and all immediate needs met.   Therapy Documentation Precautions:  Precautions Precautions: Fall Precaution Comments: L hemi, L inattention Restrictions Weight Bearing Restrictions: No Pain: Pain Assessment Pain Scale: Faces Faces Pain Scale: Hurts little more Pain Type: Acute pain Pain Location: Shoulder Pain Orientation: Left ADL: See Care Tool for more details.   Therapy/Group: Individual Therapy  Volanda Napoleon, MS , OTR/L Clyda Greener OTR/L  04/18/2020, 3:35 PM

## 2020-04-18 NOTE — Progress Notes (Signed)
Speech Language Pathology Daily Session Note  Patient Details  Name: [name] MRN: [id number] Date of Birth: 10/10/1940  Today's Date: 04/18/2020 SLP Individual Time: 1300-1330 SLP Individual Time Calculation (min): 30 min  Short Term Goals: Week 6: SLP Short Term Goal 1 (Week 6): STG=LTG due to remaining length of stay; pt currently pending LTC placement  Skilled Therapeutic Interventions:   Patient seen for afternoon skilled ST session focusing on cognitive-linguistic abilities. Patient requested to have assistance to bathroom for BM and was impatient. He did follow directions to wait in bed as SLP got NT to assist with transfer to bathroom. Patient able to follow commands to use Stedy. He required frequent verbal cues due to impulsivity with demands and actions and he did start to stand up from toilet before requesting assistance. Patient helped back into bed and he did not have awareness to the fact that he was still soiled and had not adequately cleaned himself after BM. NT called to help with self-care. Patient continues to benefit from skilled SLP intervention to maximize cognitive-linguistic, speech and swallow function goals.   Pain Pain Assessment Pain Scale: 0-10 Faces Pain Scale: No hurt Pain Type: Acute pain Pain Location: Shoulder Pain Orientation: Left  Therapy/Group: Individual Therapy  Angela Nevin, MA, CCC-SLP Speech Therapy

## 2020-04-18 NOTE — Progress Notes (Signed)
Speech Language Pathology Daily Session Note  Patient Details  Name: [name] MRN: [id number] Date of Birth: 1940/09/28  Today's Date: 04/18/2020 SLP Individual Time: 0900-0930 SLP Individual Time Calculation (min): 30 min  Short Term Goals: Week 5: SLP Short Term Goal 1 (Week 5): STG=LTG due to remaining length of stay; pt currently pending LTC placement SLP Short Term Goal 1 - Progress (Week 5): Progressing toward goal  Skilled Therapeutic Interventions:   Patient seen for skilled ST session focusing on dysphagia goals. Patient lying on side in bed but awoke easily and agreeable to have breakfast. He did initiate some repositioning in bed while SLP brought his breakfast tray over. Patient able to feed self with SLP providing setup assistance (opening containers, placing silverware in reach and putting cream and sugar in coffee). Patient did exhibit some impulsivity with eating leading to food building up in left buccal cavity but with eventual full clearance. No coughing, throat clearing or other overt s/s swallowing difficulty noted. Patient had mild amount of solid food spillage on his chest but this was not significant. He ate 100% of food on plate. RN informed SLP that she had requested double portions from dietician. She also asked about whether patient still required full supervision. Based on today's session as well as recent past session with same SLP during PO intake, agree to change him to setup assistance and intermittent supervision with meals. Patient continues to benefit from skilled SLP intervention to maximize speech, language, cognitive and swallow function goals prior to discharge.  Pain Pain Assessment Pain Scale: 0-10 Pain Score: 0-No pain Pain Type: Acute pain Pain Location: Head Patients Stated Pain Goal: 2 Pain Intervention(s): Repositioned  Therapy/Group: Individual Therapy  Angela Nevin, MA, CCC-SLP Speech Therapy

## 2020-04-18 NOTE — Progress Notes (Addendum)
Pt refused blood draw from the lab. Pt also being verbally abusive to staff yelling " get the hell out of here" "leave me the [facility] alone". Call light in reach, and bed alarm on.

## 2020-04-18 NOTE — Progress Notes (Signed)
[place] PHYSICAL MEDICINE & REHABILITATION PROGRESS NOTE   Subjective/Complaints: Awake, oriented to hosp but not date, though he was going tomorrow  Shortly after pt was informed of tent d/c 12/27   ROS: unable to assess due to mental status  Objective:   No results found. Recent Labs    04/16/20 0443 04/17/20 0438  WBC 5.8 5.0  HGB 10.6* 10.5*  HCT 33.1* 30.6*  PLT PLATELET CLUMPS NOTED ON SMEAR, UNABLE TO ESTIMATE 217   No results for input(s): NA, K, CL, CO2, GLUCOSE, BUN, CREATININE, CALCIUM in the last 72 hours.  Intake/Output Summary (Last 24 hours) at 04/18/2020 0949 Last data filed at 04/17/2020 1243 Gross per 24 hour  Intake 200 ml  Output --  Net 200 ml    Physical Exam: Vital Signs Blood pressure (!) 141/53, pulse 64, temperature 98.7 F (37.1 C), resp. rate 18, height 5\' 11"  (1.803 m), weight 66.6 kg, SpO2 100 %.    General: No acute distress Mood and affect are appropriate Heart: Regular rate and rhythm no rubs murmurs or extra sounds Lungs: Clear to auscultation, breathing unlabored, no rales or wheezes Abdomen: Positive bowel sounds, soft nontender to palpation, nondistended Extremities: No clubbing, cyanosis, or edema Skin: No evidence of breakdown, no evidence of rash   Motor: Left upper extremity shoulder abduction 3-/5, distally 4/5- unchanged  Left lower extremity: 4 -to 4/5 proximal to distal  Assessment/Plan: 1. Functional deficits which require 3+ hours per day of interdisciplinary therapy in a comprehensive inpatient rehab setting.  Physiatrist is providing close team supervision and 24 hour management of active medical problems listed below.  Physiatrist and rehab team continue to assess barriers to discharge/monitor patient progress toward functional and medical goals  Care Tool:  Bathing    Body parts bathed by patient: Chest,Abdomen,Face   Body parts bathed by helper: Left arm,Right arm Body parts n/a: Front perineal  area,Buttocks,Right upper leg,Left upper leg,Right lower leg,Left lower leg (pt declined washing these parts)   Bathing assist Assist Level: Maximal Assistance - Patient 24 - 49%     Upper Body Dressing/Undressing Upper body dressing   What is the patient wearing?: Pull over shirt    Upper body assist Assist Level: Maximal Assistance - Patient 25 - 49%    Lower Body Dressing/Undressing Lower body dressing      What is the patient wearing?: Pants,Incontinence brief     Lower body assist Assist for lower body dressing: Maximal Assistance - Patient 25 - 49%     Toileting Toileting Toileting Activity did not occur (Clothing management and hygiene only): N/A (no void or bm)  Toileting assist Assist for toileting: Minimal Assistance - Patient > 75%     Transfers Chair/bed transfer  Transfers assist     Chair/bed transfer assist level: Minimal Assistance - Patient > 75% Chair/bed transfer assistive device:   Ambulation assist      Assist level: Minimal Assistance - Patient > 75% Assistive device: Hand held assist Max distance: 10'   Walk 10 feet activity   Assist     Assist level: Minimal Assistance - Patient > 75% Assistive device: Walker-rolling   Walk 50 feet activity   Assist Walk 50 feet with 2 turns activity did not occur: Safety/medical concerns (due to patient fatigue/weakness)  Assist level: Minimal Assistance - Patient > 75% Assistive device: Walker-rolling    Walk 150 feet activity   Assist Walk 150 feet activity did not occur: Safety/medical concerns (  due to patient fatigue/weakness)  Assist level: Minimal Assistance - Patient > 75% Assistive device: Walker-rolling    Walk 10 feet on uneven surface  activity   Assist Walk 10 feet on uneven surfaces activity did not occur: Safety/medical concerns (due to patient fatigue/weakness)         Wheelchair     Assist Will patient use wheelchair at  discharge?: Yes Type of Wheelchair: Manual    Wheelchair assist level: Dependent - Patient 0% (TIS w/c) Max wheelchair distance: 150    Wheelchair 50 feet with 2 turns activity    Assist        Assist Level: Dependent - Patient 0%   Wheelchair 150 feet activity     Assist      Assist Level: Dependent - Patient 0%    Medical Problem List and Plan: 1. 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    Qd therapy , may hold over the weekend, plan D/C on Monday     2.  Antithrombotics: -DVT/anticoagulation:  Pharmaceutical: Other (comment)--Eliquis.              -antiplatelet therapy: N/A--has been off ASA/Brilinta.  3. Chronic HA/Cervicalgia/Pain Management: Was on flexeril prn PTA. Will monitor N/A.   Continue K pad and cervical spine XR given new neck pain and TTP on cervical spine.  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  12/18- pt c/o pain in legs- wasn't specific about joint, etc- will give prn meds.              Monitor with increased exertion  Muscle rub ordered on 11/26   12/5 encouraged him to use kpad! 4. Mood: LCSW to follow for evaluation and support.              -antipsychotic agents: Monitor for now.   -Less somnolent with decrease in klonopin- continue to monitor.   12/18- was restless, almost agitated, trying to GET OOB per nursing yesterday- if continues, might need to add something else prn.   12/19- added seroquel 25 mg TID Prn and 50 mg QHS prn- might need to come off to go to SNF< but nursing couldn't handle without it yesterday per them.  5. Neuropsych: This patient is not capable of making decisions on his own behalf. 6. Skin/Wound Care: Routine pressure relief measures. Protein supplement  due to evidence of malnutrition.  7. Fluids/Electrolytes/Nutrition: Monitor I/Os.   9. HTN: Monitor BP educate on importance of compliance. SBP goal 130-150   Vitals:   04/18/20 0528 04/18/20 0929  BP: (!) 141/53   Pulse: 73 64  Resp: 18   Temp: 98.7 F (37.1 C)   SpO2: 100%   12/21: HR much better controlled: continue digoxin.  10. ICM/Heart failure with persistent apical thrombus: Monitor for signs and symptoms of fluid overload. Daily weights. Continue Eliquis 11. A fib: Monitor HR -continue Lanoxin and Eliquis.   12. Acute on chronic renal failure: Likely due to gastroenteritis.   Resolved - DC IV, creat should improve off metformin 13. Electrolytes abnormalities: Resolved post supplementation 14. Anemia: Recheck iron levels.                    Hemoglobin 9.7 on 11/22 , 9.3 on 12/6 15. T2DM with hyperglycemia: Hgb A1C-6.8.  probable neuropathy trial gabapentin  low dose   Will monitor BS ac/hs.Add CM restrictions to diet.              Monitor with increased mobility CBG (last 3)  Recent Labs    04/17/20 1201 04/17/20 2108 04/18/20 0606  GLUCAP 117* 188* 115*  Well controlled off medication 16. 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 MSSA bacteremia  TEE showed no valve vegetations, no other sig structural abnl in report cardiology to f/u with recs  afeb off IV cefazolin  , WBCs normal  19.  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 Urinary incontinence - mainly due to cognition , cont timed toileting      LOS: 44 days A FACE TO FACE EVALUATION WAS PERFORMED  Erick Colace 04/18/2020, 9:49 AM

## 2020-04-18 NOTE — Progress Notes (Signed)
Physical Therapy Session Note  Patient Details  Name: [name] MRN: [id number] Date of Birth: 1940/12/08  Today's Date: 04/18/2020 PT Missed Time: 30 Minutes Missed Time Reason: Patient unwilling to participate  Short Term Goals: Week 6:  PT Short Term Goal 1 (Week 6): STG= LTG based on ELOS  Skilled Therapeutic Interventions/Progress Updates:    Patient received in bed yelling about having a headache, requesting rx. PT alerting RN and was informed that he had already received rx. PT informing patient and patient responding "what are you good for- get out" and then shouted expletives at PT. Patient missing 30 mins of therapy.   Therapy Documentation Precautions:  Precautions Precautions: Fall Precaution Comments: L hemi, L inattention Restrictions Weight Bearing Restrictions: No    Therapy/Group: Individual Therapy  Elizebeth Koller, PT, DPT, CBIS  04/18/2020, 7:37 AM

## 2020-04-19 ENCOUNTER — Inpatient Hospital Stay (HOSPITAL_COMMUNITY): Payer: Medicare PPO | Admitting: Occupational Therapy

## 2020-04-19 LAB — CBC
HCT: 32.2 % — ABNORMAL LOW (ref 39.0–52.0)
Hemoglobin: 11 g/dL — ABNORMAL LOW (ref 13.0–17.0)
MCH: 31.8 pg (ref 26.0–34.0)
MCHC: 34.2 g/dL (ref 30.0–36.0)
MCV: 93.1 fL (ref 80.0–100.0)
Platelets: UNDETERMINED 10*3/uL (ref 150–400)
RBC: 3.46 MIL/uL — ABNORMAL LOW (ref 4.22–5.81)
RDW: 12.2 % (ref 11.5–15.5)
WBC: 5 10*3/uL (ref 4.0–10.5)
nRBC: 0 % (ref 0.0–0.2)

## 2020-04-19 LAB — GLUCOSE, CAPILLARY
Glucose-Capillary: 110 mg/dL — ABNORMAL HIGH (ref 70–99)
Glucose-Capillary: 119 mg/dL — ABNORMAL HIGH (ref 70–99)
Glucose-Capillary: 161 mg/dL — ABNORMAL HIGH (ref 70–99)

## 2020-04-19 NOTE — Progress Notes (Signed)
Pt has been awake all night. Verbally abusive and inappropriate to staff. Pt attempting to climb out of bed throughout the night. Remains oriented to self only.

## 2020-04-19 NOTE — Progress Notes (Signed)
Pt given PPD test on the LFA. Will need to be checked by 04/21/20

## 2020-04-20 ENCOUNTER — Inpatient Hospital Stay (HOSPITAL_COMMUNITY): Payer: Medicare PPO

## 2020-04-20 ENCOUNTER — Inpatient Hospital Stay (HOSPITAL_COMMUNITY): Payer: Medicare PPO | Admitting: Occupational Therapy

## 2020-04-20 ENCOUNTER — Inpatient Hospital Stay (HOSPITAL_COMMUNITY): Payer: Medicare PPO | Admitting: Physical Therapy

## 2020-04-20 LAB — GLUCOSE, CAPILLARY
Glucose-Capillary: 114 mg/dL — ABNORMAL HIGH (ref 70–99)
Glucose-Capillary: 153 mg/dL — ABNORMAL HIGH (ref 70–99)
Glucose-Capillary: 244 mg/dL — ABNORMAL HIGH (ref 70–99)
Glucose-Capillary: 189 mg/dL — ABNORMAL HIGH (ref 70–99)

## 2020-04-20 LAB — CBC
HCT: 31.5 % — ABNORMAL LOW (ref 39.0–52.0)
Hemoglobin: 10.8 g/dL — ABNORMAL LOW (ref 13.0–17.0)
MCH: 31.8 pg (ref 26.0–34.0)
MCHC: 34.3 g/dL (ref 30.0–36.0)
MCV: 92.6 fL (ref 80.0–100.0)
Platelets: 220 10*3/uL (ref 150–400)
RBC: 3.4 MIL/uL — ABNORMAL LOW (ref 4.22–5.81)
RDW: 12 % (ref 11.5–15.5)
WBC: 5.2 10*3/uL (ref 4.0–10.5)
nRBC: 0 % (ref 0.0–0.2)

## 2020-04-20 MED ORDER — TRAMADOL HCL 50 MG PO TABS
50.0000 mg | ORAL_TABLET | Freq: Four times a day (QID) | ORAL | Status: DC
  Administered 2020-04-20 – 2020-04-24 (×10): 50 mg via ORAL
  Filled 2020-04-20 (×11): qty 1

## 2020-04-20 NOTE — Progress Notes (Signed)
Speech Language Pathology Daily Session Note  Patient Details  Name: [name] MRN: [id number] Date of Birth: [birth year]   Today's Date: 04/20/2020 SLP Individual Time: 7824-2353 SLP Individual Time Calculation (min): 27 min  Short Term Goals: Week 6: SLP Short Term Goal 1 (Week 6): STG=LTG due to remaining length of stay; pt currently pending LTC placement  Skilled Therapeutic Interventions:Skilled ST services focused on swallow skills. Pt expressed frustration with on going headache, however pt eventually express nurse recently providing medication, although he questioned effectiveness. SLP notified NT. Pt was agreeable to consumed dys 3 texture and thin liquid straw snack. Pt demonstrated appropriate mastication, however mild oral residue resulted in x2 delayed coughing. SLP cued pt to utilized liquid wash to clear oral cavity and no further s/s aspiration noted. Pt requested peanut butter crackers, SLP provided regular texture snack. Pt demonstrated appropriate mastication, oral clearance, swallow appeared timely and no overt s/s aspiration with supervision A verbal cues to utilize liquid wash. SLP recommends to continue solid advancement trials. Pt was left in room with call bell within reach and bed alarm set. SLP recommends to continue skilled services.      Pain Pain Assessment Pain Scale: 0-10 Pain Score: 0-No pain Pain Type: Chronic pain Pain Location: Head Pain Descriptors / Indicators: Headache Pain Frequency: Constant Pain Onset: On-going Patients Stated Pain Goal: 0 Pain Intervention(s): MD notified (Comment)  Therapy/Group: Individual Therapy  Dezaria Methot  [facility] 04/20/2020, 3:20 PM

## 2020-04-20 NOTE — Progress Notes (Signed)
Occupational Therapy Session Note  Patient Details  Name: [name] MRN: [id number] Date of Birth: [birth year]  Today's Date: 04/20/2020 OT Individual Time: 1300-1325 OT Individual Time Calculation (min): 25 min  and Today's Date: 04/20/2020 OT Missed Time: 20 Minutes Missed Time Reason: Patient unwilling/refused to participate without medical reason   Short Term Goals: Week 1:  OT Short Term Goal 1 (Week 1): Pt will complete UB dressing with min assist for pullover shirt. OT Short Term Goal 1 - Progress (Week 1): Not met OT Short Term Goal 2 (Week 1): Pt will complete LB bathing with mod assist sit to stand for two consecutive sessions. OT Short Term Goal 2 - Progress (Week 1): Met OT Short Term Goal 3 (Week 1): Pt will complete LB dressing with mod assist sit to stand. OT Short Term Goal 3 - Progress (Week 1): Not met OT Short Term Goal 4 (Week 1): Pt will use the LUE with min assist for washing the RUE. OT Short Term Goal 4 - Progress (Week 1): Not met Week 2:  OT Short Term Goal 1 (Week 2): Pt will complete LB dressing with mod assist sit to stand. OT Short Term Goal 1 - Progress (Week 2): Not met OT Short Term Goal 2 (Week 2): Pt will use the LUE with mod assist for washing the RUE. OT Short Term Goal 2 - Progress (Week 2): Not met OT Short Term Goal 3 (Week 2): Pt will complete UB dressing with min assist for pullover shirt for two sessions. OT Short Term Goal 3 - Progress (Week 2): Not met OT Short Term Goal 4 (Week 2): Pt will complete toilet transfers with min assist using the RW for support. OT Short Term Goal 4 - Progress (Week 2): Not met OT Short Term Goal 5 (Week 2): Pt will completed bathing tasks with no more than mod instructional cueing for initiation and completion of bathing tasks. OT Short Term Goal 5 - Progress (Week 2): Not met Week 3:  OT Short Term Goal 1 (Week 3): Continue working on established LTGs downgraded to mod assist overall. OT Short Term Goal 1 -  Progress (Week 3): Not met Week 4:  OT Short Term Goal 1 (Week 4): Continue working on established LTGs downgraded to mod assist overall. OT Short Term Goal 1 - Progress (Week 4): Not met Week 5:  OT Short Term Goal 1 (Week 5): Continue working on established LTGs downgraded to mod assist overall. OT Short Term Goal 1 - Progress (Week 5): Not met Week 6:  OT Short Term Goal 1 (Week 6): Continue working on established LTGs downgraded to mod assist overall.      Skilled Therapeutic Interventions/Progress Updates:    Pt received in bed on bed pan with RN present. RN had just found pt on floor as he had tried to get to the bathroom himself. NT checked vitals and pt stable.  Pt was not able to finish using bed bed pan so suggested he get dressed.  Pt did use active bridging to lift hips to have bed pan removed. Donned clothes from bed with mod A as pt very upset with me for asking him to try to dress himself.  He cursed,  Yelled and said, "just do it for me!" Nursing needed to change beds to one with a working alarm. They wanted him in wc at nursing station for supervision. Pt refused to get out of bed and unwilling to do any transfers.  Pt yelled that he wanted to be left alone.  Pt in bed with NT in the room with him.    Therapy Documentation Precautions:  Precautions Precautions: Fall Precaution Comments: L hemi, L inattention Restrictions Weight Bearing Restrictions: No  General OT Amount of Missed Time: 20 Minutes Vital Signs: Therapy Vitals Temp: 97.7 F (36.5 C) Temp Source: Oral Pulse Rate: (!) 53 Resp: 19 BP: 136/63 Patient Position (if appropriate): Lying Oxygen Therapy SpO2: 100 % O2 Device: Room Air Pain: Pain Assessment Pain Scale: 0-10 Pain Score: 4  Pain Type: Chronic pain Pain Location: Head Pain Descriptors / Indicators: Headache Pain Frequency: Constant Pain Onset: On-going Patients Stated Pain Goal: 0 Pain Intervention(s): MD notified  (Comment) ADL: ADL Eating: Supervision/safety Where Assessed-Eating: Bed level Grooming: Minimal assistance Where Assessed-Grooming: Bed level Upper Body Bathing: Minimal assistance Where Assessed-Upper Body Bathing: Wheelchair Lower Body Bathing: Dependent Where Assessed-Lower Body Bathing: Wheelchair,Sitting at sink,Standing at sink Upper Body Dressing: Maximal assistance Where Assessed-Upper Body Dressing: Sitting at sink Lower Body Dressing: Dependent Where Assessed-Lower Body Dressing: Sitting at sink,Standing at sink Toileting: Dependent Where Assessed-Toileting: Bedside Commode Toilet Transfer: Maximal assistance Toilet Transfer Method: Stand pivot Toilet Transfer Equipment: Bedside commode Tub/Shower Transfer: Not assessed   Therapy/Group: Individual Therapy  [place] 04/20/2020, 2:35 PM

## 2020-04-20 NOTE — Progress Notes (Signed)
[facility] PHYSICAL MEDICINE & REHABILITATION PROGRESS NOTE   Subjective/Complaints: Awake asking about discharge date.  At this time it looks like discharge will not be tomorrow.  Daughter thinks it will be sometime this week.  Social work involved and will be following up tomorrow   ROS: unable to assess due to mental status  Objective:   No results found. Recent Labs    04/19/20 0454 04/20/20 0559  WBC 5.0 5.2  HGB 11.0* 10.8*  HCT 32.2* 31.5*  PLT PLATELET CLUMPS NOTED ON SMEAR, UNABLE TO ESTIMATE 220   No results for input(s): NA, K, CL, CO2, GLUCOSE, BUN, CREATININE, CALCIUM in the last 72 hours.  Intake/Output Summary (Last 24 hours) at 04/20/2020 1022 Last data filed at 04/19/2020 1828 Gross per 24 hour  Intake 360 ml  Output --  Net 360 ml    Physical Exam: Vital Signs Blood pressure 128/67, pulse (!) 49, temperature 97.9 F (36.6 C), temperature source Oral, resp. rate 16, height 5\' 11"  (1.803 m), weight 66.6 kg, SpO2 100 %.    General: No acute distress Mood and affect are appropriate Heart: Regular rate and rhythm no rubs murmurs or extra sounds Lungs: Clear to auscultation, breathing unlabored, no rales or wheezes Abdomen: Positive bowel sounds, soft nontender to palpation, nondistended Extremities: No clubbing, cyanosis, or edema Skin: No evidence of breakdown, no evidence of rash     Motor: Left upper extremity shoulder abduction 3-/5, distally 4/5- unchanged  Left lower extremity: 4 -to 4/5 proximal to distal  Assessment/Plan: 1. Functional deficits which require 3+ hours per day of interdisciplinary therapy in a comprehensive inpatient rehab setting.  Physiatrist is providing close team supervision and 24 hour management of active medical problems listed below.  Physiatrist and rehab team continue to assess barriers to discharge/monitor patient progress toward functional and medical goals  Care Tool:  Bathing    Body parts bathed by  patient: Chest,Abdomen,Face   Body parts bathed by helper: Left arm,Right arm Body parts n/a: Front perineal area,Buttocks,Right upper leg,Left upper leg,Right lower leg,Left lower leg (pt declined washing these parts)   Bathing assist Assist Level: Maximal Assistance - Patient 24 - 49%     Upper Body Dressing/Undressing Upper body dressing   What is the patient wearing?: Hospital gown only    Upper body assist Assist Level: Maximal Assistance - Patient 25 - 49%    Lower Body Dressing/Undressing Lower body dressing      What is the patient wearing?: Pants,Incontinence brief     Lower body assist Assist for lower body dressing: Maximal Assistance - Patient 25 - 49%     Toileting Toileting Toileting Activity did not occur (Clothing management and hygiene only): N/A (no void or bm)  Toileting assist Assist for toileting: Minimal Assistance - Patient > 75%     Transfers Chair/bed transfer  Transfers assist     Chair/bed transfer assist level: Minimal Assistance - Patient > 75% Chair/bed transfer assistive device:   Ambulation assist      Assist level: 2 helpers (pt 70%, + 2 for B hand held assist) Assistive device: Hand held assist Max distance: ~350 ft   Walk 10 feet activity   Assist     Assist level: Minimal Assistance - Patient > 75% Assistive device: Walker-rolling   Walk 50 feet activity   Assist Walk 50 feet with 2 turns activity did not occur: Safety/medical concerns (due to patient fatigue/weakness)  Assist level: Minimal Assistance -  Patient > 75% Assistive device: Walker-rolling    Walk 150 feet activity   Assist Walk 150 feet activity did not occur: Safety/medical concerns (due to patient fatigue/weakness)  Assist level: Minimal Assistance - Patient > 75% Assistive device: Walker-rolling    Walk 10 feet on uneven surface  activity   Assist Walk 10 feet on uneven surfaces activity did not occur:  Safety/medical concerns (due to patient fatigue/weakness)         Wheelchair     Assist Will patient use wheelchair at discharge?: Yes Type of Wheelchair: Manual    Wheelchair assist level: Dependent - Patient 0% (TIS w/c) Max wheelchair distance: 150    Wheelchair 50 feet with 2 turns activity    Assist        Assist Level: Dependent - Patient 0%   Wheelchair 150 feet activity     Assist      Assist Level: Dependent - Patient 0%    Medical Problem List and Plan: 1. 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    Qd therapy , may hold over the weekend, plan D/C next week  (not Monday 12/27)    2.  Antithrombotics: -DVT/anticoagulation:  Pharmaceutical: Other (comment)--Eliquis.              -antiplatelet therapy: N/A--has been off ASA/Brilinta.  3. Chronic HA/Cervicalgia/Pain Management: Was on flexeril prn PTA. Will monitor N/A.   Continue K pad and cervical spine XR given new neck pain and TTP on cervical spine.  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  12/18- pt c/o pain in legs- wasn't specific about joint, etc- will give prn meds.              Monitor with increased exertion  Muscle rub ordered on 11/26   12/5 encouraged him to use kpad! 4. Mood: LCSW to follow for evaluation and support.              -antipsychotic agents: Monitor for now.   -Less somnolent with decrease in klonopin- continue to monitor.   12/18- was restless, almost agitated, trying to GET OOB per nursing yesterday- if continues, might need to add something else prn.   12/19- added seroquel 25 mg TID Prn and 50 mg QHS prn- might need to come off to go to SNF< but nursing couldn't handle without it yesterday per them.  5.  Neuropsych: This patient is not capable of making decisions on his own behalf. 6. Skin/Wound Care: Routine pressure relief measures. Protein supplement due to evidence of malnutrition.  7. Fluids/Electrolytes/Nutrition: Monitor I/Os.   9. HTN: Monitor BP educate on importance of compliance. SBP goal 130-150   Vitals:   04/19/20 2011 04/20/20 0608  BP: (!) 142/111 128/67  Pulse: 62 (!) 49  Resp: 16 16  Temp: 98.6 F (37 C) 97.9 F (36.6 C)  SpO2: 100% 100%  occ lability monitor for pattern  10. ICM/Heart failure with persistent apical thrombus: Monitor for signs and symptoms of fluid overload. Daily weights. Continue Eliquis 11. A fib: Monitor HR -continue Lanoxin and Eliquis.   12. Acute on chronic renal failure: Likely due to gastroenteritis.   Resolved - DC IV, creat should improve off metformin 13. Electrolytes abnormalities: Resolved post supplementation 14. Anemia: Recheck iron levels.  Hemoglobin 9.7 on 11/22 , 9.3 on 12/6 15. T2DM with hyperglycemia: Hgb A1C-6.8.  probable neuropathy trial gabapentin low dose   Will monitor BS ac/hs.Add CM restrictions to diet.              Monitor with increased mobility CBG (last 3)  Recent Labs    04/19/20 1130 04/19/20 2103 04/20/20 0558  GLUCAP 119* 161* 114*  Well controlled off medication 16. 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 MSSA bacteremia  TEE showed no valve vegetations, no other sig structural abnl in report cardiology to f/u with recs  afeb off IV cefazolin  , WBCs normal  19.  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 Urinary incontinence - mainly due to cognition , cont timed toileting      LOS: 46 days A FACE TO FACE EVALUATION WAS PERFORMED  Erick Colace 04/20/2020, 10:22 AM

## 2020-04-20 NOTE — Progress Notes (Signed)
Pt is in bed, awake and A/O x 3 after fall this afternoon @1315 , vital signs being continued every 30 minutes, all vitals stable, and pt was given tramadol for pain @1523 . Pts daughter has been made aware of fall this afternoon.   

## 2020-04-20 NOTE — Progress Notes (Signed)
Physical Therapy Session Note  Patient Details  Name: [name] MRN: [id number] Date of Birth: Jun 24, 1940  Today's Date: 04/20/2020 PT Individual Time: 0086-7619 PT Individual Time Calculation (min): 41 min   and  Today's Date: 04/20/2020 PT Missed Time: 34 Minutes Missed Time Reason: Patient unwilling to participate;Patient fatigue  Short Term Goals: Week 6:  PT Short Term Goal 1 (Week 6): STG= LTG based on ELOS  Skilled Therapeutic Interventions/Progress Updates:    Confirmed with RN and reviewed CT with pt cleared to participate in therapy. Pt received L sidelying in bed with leg hanging over bedrail and pt requesting to go to bathroom. Agreeable to therapy session. Supine>sitting L EOB with CGA for safety while bringing trunk upright. Once sitting EOB, declined assistance to the bathroom but reports he wants to order dessert. With gentle redirection/encouragement agreeable to ambulate to nurses station to get a menu. Sit>stand EOB>RW with cuing to push up from bed with R UE - mod assist for lifting up into standing - cuing for L hand placement on RW orthotic and assist for strapping onto orthotic. Gait training ~162ft around nurses station using RW with min assist for balance due to intermittent L lateral lean and decreased L LE step length, decreased gait speed with decreased BLE step lengths - with gentle redirection pt agreeable to stay in dayroom and continue therapy session. During gait training pt able to recall that he had a fall earlier and reports that he hit his head, states that he thought he was doing well enough to get up without assistance but that he learned it wasn't safe - demonstrating improving awareness of deficits. Performed upright sitting tolerance with dual-cognitive-task using PEG board with pt requiring max step-by-step cuing to complete simple complexity pattern and with question cuing pt able to recognize errors. Attempted slightly more complex pattern but pt  starts becoming frustrated with the difficulty of the task and is not agreeable to continue despite encouragement. Pt requests to return to his room and unable to successfully redirect nor encourage pt to continue participating despite multiple attempts. Gait training ~185ft back to room using RW with continued min assist for balance due to intermittent L lateral lean as described above. Sit>supine with CGA for safety. Therapist provides cuing for pt to locate dining services phone number on the menu and use telephone to call and order his dessert. Pt left supine in bed with needs in reach, bed alarm on, and floor pads in place. Missed 34 minutes of skilled physical therapy.  Therapy Documentation Precautions:  Precautions Precautions: Fall Precaution Comments: L hemi, L inattention Restrictions Weight Bearing Restrictions: No  Pain:   No specific reports of pain but pt states "man, that fall really took it out of me."  Therapy/Group: Individual Therapy  Ginny Forth , PT, DPT, CSRS  04/20/2020, 1:02 PM

## 2020-04-20 NOTE — Progress Notes (Signed)
Notified by nursing of unwitnessed fall.  Pt had HA prior to fall.  CT head unchanged.  Ordered tramadol for pain not relieved by tylenol

## 2020-04-20 NOTE — Progress Notes (Signed)
   04/20/20 1316  What Happened  Was fall witnessed? No  Was patient injured? Yes  Patient found on floor (on his bottom)  Found by Staff-comment Wilhemina Cash, LPN)  Stated prior activity other (comment) (patient leans over with leg over bed rail)  Follow Up  MD notified Claudette Laws  Time MD notified 1320  Family notified Yes - comment  Time family notified 1130  Additional tests Yes-comment (CT Head)  Progress note created (see row info) Yes  Adult Fall Risk Assessment  Risk Factor Category (scoring not indicated) Fall has occurred during this admission (document High fall risk)  Patient Fall Risk Level High fall risk  Adult Fall Risk Interventions  Required Bundle Interventions *See Row Information* High fall risk - low, moderate, and high requirements implemented  Additional Interventions Lap belt while in chair/wheelchair;Use of appropriate toileting equipment (bedpan, BSC, etc.);Reorient/diversional activities with confused patients  Screening for Fall Injury Risk (To be completed on HIGH fall risk patients) - Assessing Need for Low Bed  Risk For Fall Injury- Low Bed Criteria Previous fall this admission  Will Implement Low Bed and Floor Mats Low bed contraindicated, floor mats in place  Specialty Low Bed Contraindicated Hemodynamically unstable  Screening for Fall Injury Risk (To be completed on HIGH fall risk patients who do not meet crieteria for Low Bed) - Assessing Need for Floor Mats Only  Risk For Fall Injury- Criteria for Floor Mats Bleeding risk-anticoagulation (not prophylaxis)  Will Implement Floor Mats Yes  Vitals  Temp 97.7 F (36.5 C)  Temp Source Oral  BP 136/63  MAP (mmHg) 84  BP Location Right Arm  BP Method Automatic  Patient Position (if appropriate) Lying  Pulse Rate (!) 53  Resp 19  Oxygen Therapy  SpO2 100 %  O2 Device Room Air  Pain Assessment  Pain Scale 0-10  Pain Score 4  Pain Type Chronic pain  Pain Location Head  Pain  Descriptors / Indicators Headache  Pain Frequency Constant  Pain Onset On-going  Patients Stated Pain Goal 0  Pain Intervention(s) MD notified (Comment)  Neurological  Neuro (WDL) X  Level of Consciousness Alert  Orientation Level Disoriented X4  Cognition Impulsive;Poor attention/concentration;Poor judgement;Poor safety awareness;Unable to follow commands;Memory impairment  Speech Clear  R Hand Grip Moderate  L Hand Grip Weak   R Foot Dorsiflexion Moderate  L Foot Dorsiflexion Weak  R Foot Plantar Flexion Moderate  L Foot Plantar Flexion Moderate  RUE Motor Response Purposeful movement  RUE Sensation Full sensation  RUE Motor Strength 5  LUE Motor Response Purposeful movement  LUE Sensation Decreased  LUE Motor Strength 3  RLE Motor Response Purposeful movement  RLE Sensation Full sensation  RLE Motor Strength 5  LLE Motor Response Purposeful movement  LLE Sensation Decreased  LLE Motor Strength 4  Neuro Symptoms Anxiety;Agitation  Neuro symptoms relieved by Rest  Musculoskeletal  Musculoskeletal (WDL) X  Assistive Device [place];Wheelchair  Integumentary  Integumentary (WDL) X  Skin Color Appropriate for ethnicity  Skin Condition Dry  Skin Integrity Abrasion  Abrasion Location Knee;Shoulder  Abrasion Location Orientation Left;Bilateral  Abrasion Intervention Foam  Skin Turgor Non-tenting

## 2020-04-20 NOTE — Progress Notes (Signed)
Physical Therapy Session Note  Patient Details  Name: [name] MRN: [id number] Date of Birth: [birth year]  Today's Date: 04/20/2020 PT Missed Time: 45 Minutes Missed Time Reason: Patient unwilling to participate  Short Term Goals: Week 6:  PT Short Term Goal 1 (Week 6): STG= LTG based on ELOS  Skilled Therapeutic Interventions/Progress Updates:    Patient received supine in bed, strongly refusing to get out of bed and participate in PT this AM. PT will attempt later as schedule permits.   Therapy Documentation Precautions:  Precautions Precautions: Fall Precaution Comments: L hemi, L inattention Restrictions Weight Bearing Restrictions: No    Therapy/Group: Individual Therapy  Elizebeth Koller, PT, DPT, CBIS  04/20/2020, 7:34 AM

## 2020-04-21 ENCOUNTER — Inpatient Hospital Stay (HOSPITAL_COMMUNITY): Payer: Medicare PPO

## 2020-04-21 ENCOUNTER — Inpatient Hospital Stay (HOSPITAL_COMMUNITY): Payer: Medicare PPO | Admitting: Occupational Therapy

## 2020-04-21 LAB — BASIC METABOLIC PANEL
Anion gap: 10 (ref 5–15)
BUN: 27 mg/dL — ABNORMAL HIGH (ref 8–23)
CO2: 24 mmol/L (ref 22–32)
Calcium: 9.2 mg/dL (ref 8.9–10.3)
Chloride: 107 mmol/L (ref 98–111)
Creatinine, Ser: 1.32 mg/dL — ABNORMAL HIGH (ref 0.61–1.24)
GFR, Estimated: 56 mL/min — ABNORMAL LOW (ref >60)
Glucose, Bld: 114 mg/dL — ABNORMAL HIGH (ref 70–99)
Potassium: 4.4 mmol/L (ref 3.5–5.1)
Sodium: 141 mmol/L (ref 135–145)

## 2020-04-21 LAB — SARS CORONAVIRUS 2 BY RT PCR (HOSPITAL ORDER, PERFORMED IN ~~LOC~~ HOSPITAL LAB): SARS Coronavirus 2: NEGATIVE

## 2020-04-21 LAB — CBC
HCT: 31.6 % — ABNORMAL LOW (ref 39.0–52.0)
Hemoglobin: 10.5 g/dL — ABNORMAL LOW (ref 13.0–17.0)
MCH: 31.1 pg (ref 26.0–34.0)
MCHC: 33.2 g/dL (ref 30.0–36.0)
MCV: 93.5 fL (ref 80.0–100.0)
Platelets: 210 10*3/uL (ref 150–400)
RBC: 3.38 MIL/uL — ABNORMAL LOW (ref 4.22–5.81)
RDW: 11.9 % (ref 11.5–15.5)
WBC: 5.3 10*3/uL (ref 4.0–10.5)
nRBC: 0 % (ref 0.0–0.2)

## 2020-04-21 LAB — GLUCOSE, CAPILLARY
Glucose-Capillary: 112 mg/dL — ABNORMAL HIGH (ref 70–99)
Glucose-Capillary: 138 mg/dL — ABNORMAL HIGH (ref 70–99)

## 2020-04-21 NOTE — Progress Notes (Signed)
Patient ID: [name], male   DOB: 01-17-1941, 79 y.o.   MRN: [id number]   SW received follow up from [facility] [phone]. Patient hospital bed and wheelchair scheduled for delivery tomorrow. SW will follow up with patient daughter to schedule family education and schedule set discharge date.  [place], [place] [phone]

## 2020-04-21 NOTE — Progress Notes (Signed)
Speech Language Pathology Daily Session Note  Patient Details  Name: KAIEA ESSELMAN MRN: [id number] Date of Birth: 08-09-1940  Today's Date: 04/21/2020 SLP Individual Time: 1030-1100 SLP Individual Time Calculation (min): 30 min  Short Term Goals: Week 6: SLP Short Term Goal 1 (Week 6): STG=LTG due to remaining length of stay; pt currently pending LTC placement  Skilled Therapeutic Interventions: Skilled ST services focused on swallow skills, pt perseverating on recent fall but agreeable to ST tx. Pt provided Dys 3 and regular texture snack this date. Pt demonstrated appropriate mastication and oral clearance throughout, swallow initiation appeared with be timely. Pt did benefit from occ verbal cues to decreased rate of intake/bolus size and increase rate of liquid wash. Pt with no s/s aspiration with any trials Dys 3 or regular solids, no significant difference in oropharyngeal swallow function for either texture. SLP recommends whole meal trials of advanced textures (Dys 3 and regular) to determine tolerance and fatigue factor. Pt left in bed with call bell within reach and bed alarm set. Cont ST POC.   Pain Pain Assessment Pain Scale: 0-10 Pain Score: 0-No pain  Therapy/Group: Individual Therapy  Tacey Ruiz 04/21/2020, 10:54 AM

## 2020-04-21 NOTE — Progress Notes (Signed)
Occupational Therapy Weekly Progress Note  Patient Details  Name: [name] MRN: [id number] Date of Birth: 27-Jan-1941  Beginning of progress report period: April 12, 2020 End of progress report period: April 21, 2020  Today's Date: 04/21/2020 OT Individual Time: 1448-1856 OT Individual Time Calculation (min): 30 min   Patient cont to slowly progress towards LTG, primarily limited by impaired cognition. Has demonstrated decreased agitation this past week, but cont to perseverate on telling stories, impairing his ability to complete ADL tasks. Pt is able to consistently state he had a stroke and that he is in a hospital, but cont to be confused about day of the week, exact name of hospital, and his DC plans (cont to think he is going home vs LTC where his daughter lives). Pt is currently min A for func transfers/mobility with or without RW, mod to max A for full-body dressing/bathing, min A for seated grooming with LUE, and close sup for self-feeding with LUE. Req mod A to maintain grip on ADL items. A levels may very 2/2 agitation. [place]strom levels for LUE: 2-3, L hand: 3.  Pt consistently c/o of LUE pain, primarily in shoulder, and has limited awareness of LUE placement during func transfers, often rolling on top of it in the bed. Increased hypertonicity in L hand, LUE.   Patient continues to demonstrate the following deficits: abnormal tone, unbalanced muscle activation and decreased coordination and decreased initiation, decreased attention, decreased awareness, decreased problem solving, decreased safety awareness, decreased memory and delayed processing and therefore will continue to benefit from skilled OT intervention to enhance overall performance with BADL and Reduce care partner burden.  Patient progressing toward long term goals..  Continue plan of care.  OT Short Term Goals Week 7:  OT Short Term Goal 1 (Week 7): Continue working on established LTGs downgraded to mod assist  overall.  Skilled Therapeutic Interventions/Progress Updates:    Pt received sitting up in bed, attempting to use restroom, bed alarm was engaged. Pt returned to sup with close S, then returning to side lying with min A to bend RLE, and mod A to lift trunk and move RLE off bed. Maintains static sitting with CGA 2/2 impulsiveness, no significant L lean. Pt recounting his fall over the weekend, stating he "won't do that again." Doffed dirty shirt with mod A to remove BUE, donned new shirt with max A to thread LUE, pull over head, and adjust over body. Declines having to use bathroom. Sit > stand with RW with min A to place L hand in orthotic, ambulated total of 282 ft with RW + min A for balance. Pt motivated to retrieve new newspaper from nurses station. Returned to room, stand > sit back on bed with min A for removing L hand from orthotic + controlled descent, returned to supine with min A to bring LLE on bed. Left semi-reclined in bed with HOB raised, LUE supported by pillow, call bell in reach, four bed rails up for safety, and bed alarm engaged, NT present. No c/o of pain this session, cont to perseverate on telling stories, impairing ability to complete tasks without A due to being internally distracted. Stated day correctly as Monday.  Therapy Documentation Precautions:  Precautions Precautions: Fall Precaution Comments: L hemi, L inattention Restrictions Weight Bearing Restrictions: No General: General PT Missed Treatment Reason: Patient unwilling to participate Pain: Pain Assessment Pain Scale: 0-10 Pain Score: 0-No pain ADL: See Care Tool for more details.  Therapy/Group: Individual Therapy  Windy Fast  Elisabeth Most, MS, OTR/L 04/21/2020, 3:35 PM

## 2020-04-21 NOTE — Progress Notes (Signed)
[facility] PHYSICAL MEDICINE & REHABILITATION PROGRESS NOTE   Subjective/Complaints: Plan for d/c Wednesday Patient asks to be repositioned.  He c/o pain in his neck, agreeable to heating pad Mood stable  ROS: + left sided neck pain  Objective:   CT HEAD WO CONTRAST  Result Date: 04/20/2020 CLINICAL DATA:  Head trauma.  Hit his head against the wall. EXAM: CT HEAD WITHOUT CONTRAST TECHNIQUE: Contiguous axial images were obtained from the base of the skull through the vertex without intravenous contrast. COMPARISON:  CT head February 29, 2020. FINDINGS: Brain: Evolving right MCA territory infarcts with decreased mass effect and progressive encephalomalacia. No evidence of acute hemorrhage. No evidence of interval/new acute large vascular territory infarct. Remote lacunar infarcts in the right cerebellum. Additional patchy white matter hypoattenuation, most likely related to chronic microvascular ischemic disease. No hydrocephalus. No mass lesion. No midline shift. Vascular: Calcific atherosclerosis. Skull: No acute fracture. Sinuses/Orbits: Visualized sinuses are clear.  Unremarkable orbits. Other: No mastoid effusions. IMPRESSION: 1. No evidence of acute intracranial abnormality. 2. Evolving right MCA territory infarcts. 3. Remote right cerebellar lacunar infarcts and chronic microvascular ischemic disease. Electronically Signed   By: Feliberto Harts MD   On: 04/20/2020 14:26   Recent Labs    04/20/20 0559 04/21/20 0613  WBC 5.2 5.3  HGB 10.8* 10.5*  HCT 31.5* 31.6*  PLT 220 210   Recent Labs    04/21/20 0613  NA 141  K 4.4  CL 107  CO2 24  GLUCOSE 114*  BUN 27*  CREATININE 1.32*  CALCIUM 9.2    Intake/Output Summary (Last 24 hours) at 04/21/2020 1225 Last data filed at 04/21/2020 0729 Gross per 24 hour  Intake 476 ml  Output --  Net 476 ml    Physical Exam: Vital Signs Blood pressure 131/61, pulse (!) 51, temperature 98 F (36.7 C), resp. rate 16, height 5\' 11"   (1.803 m), weight 67.7 kg, SpO2 100 %.  Gen: no distress, normal appearing HEENT: oral mucosa pink and moist, NCAT Cardio: Bradycardia Chest: normal effort, normal rate of breathing Abd: soft, non-distended Ext: no edema Skin: intact Psych: pleasant, normal affect Motor: Left upper extremity shoulder abduction 3-/5, distally 4/5- unchanged  Left lower extremity: 4 -to 4/5 proximal to distal  Assessment/Plan: 1. Functional deficits which require 3+ hours per day of interdisciplinary therapy in a comprehensive inpatient rehab setting.  Physiatrist is providing close team supervision and 24 hour management of active medical problems listed below.  Physiatrist and rehab team continue to assess barriers to discharge/monitor patient progress toward functional and medical goals  Care Tool:  Bathing    Body parts bathed by patient: Chest,Abdomen,Face   Body parts bathed by helper: Left arm,Right arm Body parts n/a: Front perineal area,Buttocks,Right upper leg,Left upper leg,Right lower leg,Left lower leg (pt declined washing these parts)   Bathing assist Assist Level: Maximal Assistance - Patient 24 - 49%     Upper Body Dressing/Undressing Upper body dressing   What is the patient wearing?: Hospital gown only    Upper body assist Assist Level: Maximal Assistance - Patient 25 - 49%    Lower Body Dressing/Undressing Lower body dressing      What is the patient wearing?: Pants,Incontinence brief     Lower body assist Assist for lower body dressing: Maximal Assistance - Patient 25 - 49%     Toileting Toileting Toileting Activity did not occur (Clothing management and hygiene only): N/A (no void or bm)  Toileting assist Assist for toileting:  Minimal Assistance - Patient > 75%     Transfers Chair/bed transfer  Transfers assist     Chair/bed transfer assist level: Moderate Assistance - Patient 50 - 74% Chair/bed transfer assistive device: Surveyor, quantity assist      Assist level: Minimal Assistance - Patient > 75% Assistive device: Walker-rolling Max distance: 173ft   Walk 10 feet activity   Assist     Assist level: Minimal Assistance - Patient > 75% Assistive device: Walker-rolling   Walk 50 feet activity   Assist Walk 50 feet with 2 turns activity did not occur: Safety/medical concerns (due to patient fatigue/weakness)  Assist level: Minimal Assistance - Patient > 75% Assistive device: Walker-rolling    Walk 150 feet activity   Assist Walk 150 feet activity did not occur: Safety/medical concerns (due to patient fatigue/weakness)  Assist level: Minimal Assistance - Patient > 75% Assistive device: Walker-rolling    Walk 10 feet on uneven surface  activity   Assist Walk 10 feet on uneven surfaces activity did not occur: Safety/medical concerns (due to patient fatigue/weakness)         Wheelchair     Assist Will patient use wheelchair at discharge?: Yes Type of Wheelchair: Manual    Wheelchair assist level: Dependent - Patient 0% (TIS w/c) Max wheelchair distance: 150    Wheelchair 50 feet with 2 turns activity    Assist        Assist Level: Dependent - Patient 0%   Wheelchair 150 feet activity     Assist      Assist Level: Dependent - Patient 0%    Medical Problem List and Plan: 1. 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    Qd therapy , may hold over the weekend, plan D/C next week  (not Monday 12/27)  DC Wednesday    2.  Antithrombotics: -DVT/anticoagulation:  Pharmaceutical: Other (comment)--Eliquis.              -antiplatelet therapy: N/A--has been off ASA/Brilinta.  3. Chronic HA/Cervicalgia/Pain  Management: Was on flexeril prn PTA. Will monitor N/A.   Continue K pad and cervical spine XR given new neck pain and TTP on cervical spine.  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  12/18- pt c/o pain in legs- wasn't specific about joint, etc- will give prn meds.              Monitor with increased exertion  Muscle rub ordered on 11/26   12/5 encouraged him to use kpad! 4. Mood: LCSW to follow for evaluation and support.              -antipsychotic agents: Monitor for now.   -Less somnolent with decrease in klonopin- continue to monitor.   12/18- was restless, almost agitated, trying to GET OOB per nursing yesterday- if continues, might need to add something else prn.   12/19- added seroquel 25 mg TID Prn and 50 mg QHS prn- might need to come off to go to SNF< but nursing couldn't handle without it yesterday per them.  5. Neuropsych: This patient is not capable of making decisions on his own behalf. 6. Skin/Wound Care: Routine pressure relief measures. Protein supplement due to evidence of malnutrition.  7. Fluids/Electrolytes/Nutrition: Monitor I/Os.   9. HTN: Monitor BP educate  on importance of compliance. SBP goal 130-150   Vitals:   04/21/20 0808 04/21/20 1016  BP: (!) 131/59 131/61  Pulse: 64 (!) 51  Resp: 16 16  Temp: 98 F (36.7 C) 98 F (36.7 C)  SpO2: 100% 100%  well controlled: continue current regimen.  10. ICM/Heart failure with persistent apical thrombus: Monitor for signs and symptoms of fluid overload. Daily weights. Continue Eliquis 11. A fib: Monitor HR -continue Lanoxin and Eliquis.  12. Acute on chronic renal failure: Likely due to gastroenteritis.   12/27: Creatinine trending upward to 1.32 on 12/27. Placed nursing order to encourage hydration, repeat Cr tomorrow.  13. Electrolytes abnormalities: Resolved post supplementation 14. Anemia: Recheck iron levels.                    Hemoglobin 9.7 on 11/22 , 9.3 on 12/6, 10.6 on 12/27- monitor  weekly.  15. T2DM with hyperglycemia: Hgb A1C-6.8.  probable neuropathy trial gabapentin low dose   Will monitor BS ac/hs.Add CM restrictions to diet.              Monitor with increased mobility CBG (last 3)  Recent Labs    04/20/20 2059 04/21/20 0602 04/21/20 1209  GLUCAP 189* 112* 138*  Well controlled off medication 16. 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 MSSA bacteremia  TEE showed no valve vegetations, no other sig structural abnl in report cardiology to f/u with recs  afeb off IV cefazolin  , WBCs normal  19.  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 Urinary incontinence - mainly due to cognition , cont timed toileting      LOS: 47 days A FACE TO FACE EVALUATION WAS PERFORMED  Drema Pry Alverda Nazzaro 04/21/2020, 12:25 PM

## 2020-04-21 NOTE — Progress Notes (Signed)
Bedside nurse and charge nurse Alfredo Martinez) noticed a missing tooth. Patient states "it chipped off when I fell yesterday". Call bell at bedside.

## 2020-04-21 NOTE — Progress Notes (Signed)
Patient refused blood glucose check. Patient stated "get the f out of my room, yall are irritating". Nurse educated pt on importance of sugar check and pt still refused. Nurse and tech left room with call light within reach, bed in lowest position and all alarms on.

## 2020-04-21 NOTE — Progress Notes (Signed)
Physical Therapy Session Note  Patient Details  Name: [name] MRN: [id number] Date of Birth: 05/26/1940  Today's Date: 04/21/2020 PT Individual Time: 1300-1320 PT Individual Time Calculation (min): 20 min  and Today's Date: 04/21/2020 PT Missed Time: 10 Minutes Missed Time Reason: Patient unwilling to participate  Short Term Goals: Week 6:  PT Short Term Goal 1 (Week 6): STG= LTG based on ELOS  Skilled Therapeutic Interventions/Progress Updates:    Patient received supine in bed, agreeable to PT. He reports HA, premedicated, but requesting additional rx. PT made RN aware. PT providing distractions and repositioning to assist with pain management. He was initially agreeable to transferring out of bed. Requesting to don slacks with belt, instead of sweat pants. MaxA to don pants, patient able to engage R UE to assist in pulling up pants, but required max cuing to maintain attention on task. He was able to come to sit edge of bed with MinA. Once edge of bed, patient stating he wasn't supposed to get out of bed until 2:15 when he was meant to go home. PT attempting to reorient patient to date, time and dc plan. Patient displaying stiff cervical spine, rotating entire trunk to look at clock on the wall, instead of cervical/capitol rotation. PT unable to reorient patient to participate in therapy on this date. Patient returning supine with supervision. Bed alarm on, bed in lowest position with fall mats in place, call light within reach.    Therapy Documentation Precautions:  Precautions Precautions: Fall Precaution Comments: L hemi, L inattention Restrictions Weight Bearing Restrictions: No    Therapy/Group: Individual Therapy  Elizebeth Koller, PT, DPT, CBIS  04/21/2020, 7:41 AM

## 2020-04-22 ENCOUNTER — Inpatient Hospital Stay (HOSPITAL_COMMUNITY): Payer: Medicare PPO | Admitting: Occupational Therapy

## 2020-04-22 ENCOUNTER — Inpatient Hospital Stay (HOSPITAL_COMMUNITY): Payer: Medicare PPO | Admitting: Physical Therapy

## 2020-04-22 ENCOUNTER — Other Ambulatory Visit (HOSPITAL_COMMUNITY): Payer: Self-pay | Admitting: Physical Medicine and Rehabilitation

## 2020-04-22 ENCOUNTER — Inpatient Hospital Stay (HOSPITAL_COMMUNITY): Payer: Medicare PPO | Admitting: Speech Pathology

## 2020-04-22 LAB — CBC
HCT: 30.5 % — ABNORMAL LOW (ref 39.0–52.0)
Hemoglobin: 10.3 g/dL — ABNORMAL LOW (ref 13.0–17.0)
MCH: 31.3 pg (ref 26.0–34.0)
MCHC: 33.8 g/dL (ref 30.0–36.0)
MCV: 92.7 fL (ref 80.0–100.0)
Platelets: 202 10*3/uL (ref 150–400)
RBC: 3.29 MIL/uL — ABNORMAL LOW (ref 4.22–5.81)
RDW: 11.9 % (ref 11.5–15.5)
WBC: 4.5 10*3/uL (ref 4.0–10.5)
nRBC: 0 % (ref 0.0–0.2)

## 2020-04-22 LAB — BASIC METABOLIC PANEL
Anion gap: 9 (ref 5–15)
BUN: 28 mg/dL — ABNORMAL HIGH (ref 8–23)
CO2: 25 mmol/L (ref 22–32)
Calcium: 8.8 mg/dL — ABNORMAL LOW (ref 8.9–10.3)
Chloride: 108 mmol/L (ref 98–111)
Creatinine, Ser: 1.49 mg/dL — ABNORMAL HIGH (ref 0.61–1.24)
GFR, Estimated: 48 mL/min — ABNORMAL LOW (ref >60)
Glucose, Bld: 104 mg/dL — ABNORMAL HIGH (ref 70–99)
Potassium: 4.3 mmol/L (ref 3.5–5.1)
Sodium: 142 mmol/L (ref 135–145)

## 2020-04-22 LAB — GLUCOSE, CAPILLARY: Glucose-Capillary: 92 mg/dL (ref 70–99)

## 2020-04-22 MED ORDER — ASCORBIC ACID 250 MG PO TABS: 250.0000 mg | ORAL_TABLET | Freq: Every day | ORAL | 0 refills | Status: AC

## 2020-04-22 MED ORDER — CITALOPRAM HYDROBROMIDE 10 MG PO TABS: 10.0000 mg | ORAL_TABLET | Freq: Every day | ORAL | 0 refills | Status: DC

## 2020-04-22 MED ORDER — TRAMADOL HCL 50 MG PO TABS: 50.0000 mg | ORAL_TABLET | Freq: Four times a day (QID) | ORAL | 0 refills | Status: DC | PRN

## 2020-04-22 MED ORDER — ACETAMINOPHEN 325 MG PO TABS: 325.0000 mg | ORAL_TABLET | ORAL | 0 refills | Status: DC | PRN

## 2020-04-22 MED ORDER — ELIQUIS 5 MG PO TABS: 5.0000 mg | ORAL_TABLET | Freq: Two times a day (BID) | ORAL | 0 refills | Status: DC

## 2020-04-22 MED ORDER — QUETIAPINE FUMARATE 100 MG PO TABS: 100.0000 mg | ORAL_TABLET | Freq: Every day | ORAL | 0 refills | Status: DC

## 2020-04-22 MED ORDER — DIGOXIN 125 MCG PO TABS: 0.1250 mg | ORAL_TABLET | Freq: Every day | ORAL | 0 refills | Status: DC

## 2020-04-22 MED FILL — ACETAMINOPHEN 325 MG TABS: 325 | 8 days supply | Qty: 100 | Fill #0

## 2020-04-22 MED FILL — ELIQUIS 5 MG TABLET: 5 | 30 days supply | Qty: 60 | Fill #0

## 2020-04-22 MED FILL — DIGOXIN 0.125 MG TABLET: 125 | 30 days supply | Qty: 30 | Fill #0

## 2020-04-22 MED FILL — CITALOPRAM HBR 10 MG TABLET: 10 | 30 days supply | Qty: 30 | Fill #0

## 2020-04-22 MED FILL — VITAMIN C 500 MG TABLET: 500 | 60 days supply | Qty: 30 | Fill #0

## 2020-04-22 MED FILL — QUETIAPINE FUMARATE 100 MG: 100 | 30 days supply | Qty: 30 | Fill #0

## 2020-04-22 MED FILL — traMADol HCL 50 MG TABS: 50 | 5 days supply | Qty: 20 | Fill #0

## 2020-04-22 NOTE — Progress Notes (Signed)
Physical Therapy Discharge Summary  Patient Details  Name: [name] MRN: [id number] Date of Birth: 13-Apr-1941    Patient has met 9 of 10 long term goals due to improved balance, improved postural control, increased strength, increased range of motion, decreased pain, ability to compensate for deficits and improved coordination.  Patient to discharge at a wheelchair level [place].   Patient's care partner unavailable to provide the necessary physical and cognitive assistance at discharge.  Reasons goals not met: Patient refused to participate in car transfer. PT unable to assess if this goal was achieved or not.   Patient with lengthy rehab stay due to unstable dc disposition. Adult dtr is unable to provide level of physical assist needed for patient safety as well, she is unable to provide necessary 24/7 supervision to ensure patients safety. He remains impulsive, disoriented and occassionally agitated and is not safe to be left unattended for any period of time. Adult dtr unable to attend any family education sessions and so has not been trained on how to properly assist patient. Dtr with intentions of driving patient from [place] to [place] to next care facility. This PT does not advise this as PT unable to teach dtr how to safely complete car transfer. Patient unwilling to practice car transfer with PT throughout his length of stay, so PT unable to even discuss car transfer process with dtr. PT unsure of how patient will tolerate lengthy car ride as well. Patient would be safest with skilled transport at this time.   Recommendation:  Patient will benefit from ongoing skilled PT services in LTC facility  to continue to advance safe functional mobility, address ongoing impairments in awareness/orientation, sitting/standing balance, gait progressions, wc mobility, and minimize fall risk.  Equipment: hospital bed, rolling walker, standard wc Patient would benefit from and be safest in a tilt in space  wc to ensure optimal positioning and pressure relief. However, due to unstable dc disposition and complexity of dc to another state to unknown facility, unable to provide custom wc at this time. He would benefit from custom wc consult once he is established at next care facility.   Reasons for discharge: treatment goals met and discharge from hospital  Patient/family agrees with progress made and goals achieved: Yes  PT Discharge Precautions/Restrictions Precautions Precautions: Fall Precaution Comments: L hemi, L inattention Restrictions Weight Bearing Restrictions: No Vision/Perception  Vision - Assessment Eye Alignment: Within Functional Limits Ocular Range of Motion: Within Functional Limits Alignment/Gaze Preference: Within Defined Limits Tracking/Visual Pursuits: Able to track stimulus in all quads without difficulty Saccades: Overshoots (overhsoots target) Convergence: Impaired (comment) (very minimal convergence/divergence) Additional Comments: R gaze preference Perception Perception: Impaired Inattention/Neglect: Appears intact Body Scheme: Occassional minimal to mod L lean when seated/standing Praxis Praxis: Impaired Praxis Impairment Details: Initiation;Motor planning;Perseveration Praxis-Other Comments: Decreased initiation of ADL tasks  Cognition Overall Cognitive Status: Impaired/Different from baseline Arousal/Alertness: Awake/alert Orientation Level: Oriented to person Attention: Focused;Sustained Focused Attention: Impaired Sustained Attention: Impaired Sustained Attention Impairment: Functional basic;Verbal basic Memory: Impaired Memory Impairment: Decreased short term memory;Decreased recall of new information Decreased Short Term Memory: Verbal basic;Functional basic Awareness: Impaired Awareness Impairment: Intellectual impairment Problem Solving: Impaired Problem Solving Impairment: Verbal basic;Functional basic Reasoning: Impaired Sequencing:  Impaired Organizing: Impaired Decision Making: Impaired Self Monitoring: Impaired Behaviors: Impulsive;Verbal agitation;Poor frustration tolerance;Perseveration;Agitated Behavior Scale Safety/Judgment: Impaired Comments: Cont to be perseverate on telling the same stories, perseverate on going back to bed or to the bathroom although there is no physical need Sensation Sensation Light  Touch: Appears Intact Hot/Cold: Appears Intact Proprioception: Appears Intact Stereognosis: Appears Intact Additional Comments: unable to formally assess due to confusion/agitaiton Coordination Gross Motor Movements are Fluid and Coordinated: No Fine Motor Movements are Fluid and Coordinated: No Coordination and Movement Description: Pt unable to complete L thumb - digit opposition when verbally instruction + visual demonstration, unable to fully assess 2/2 confusion; Brummstrom level III for L arm, IV for hand Heel Shin Test: limited L LE Motor  Motor Motor: Hemiplegia;Abnormal tone;Abnormal postural alignment and control Motor - Skilled Clinical Observations: LUE and LLE hemiplegia Motor - Discharge Observations: LUE and LLE hemiplegia  Mobility Bed Mobility Bed Mobility: Rolling Right;Rolling Left;Supine to Sit;Sit to Supine Rolling Right: Contact Guard/Touching assist Rolling Left: Contact Guard/Touching assist Left Sidelying to Sit: Moderate Assistance - Patient 50-74% Supine to Sit: Contact Guard/Touching assist Sitting - Scoot to Edge of Bed: Moderate Assistance - Patient 50-74% Sit to Supine: Contact Guard/Touching assist Sit to Sidelying Left: Minimal Assistance - Patient > 75% Transfers Transfers: Sit to Stand;Stand to Sit;Stand Pivot Transfers Sit to Stand: Minimal Assistance - Patient > 75% Stand to Sit: Minimal Assistance - Patient > 75% Stand Pivot Transfers: Minimal Assistance - Patient > 75% Stand Pivot Transfer Details: Verbal cues for precautions/safety;Verbal cues for  technique;Verbal cues for sequencing Transfer (Assistive device): Rolling walker Locomotion  Gait Ambulation: Yes Gait Assistance: Minimal Assistance - Patient > 75% Gait Distance (Feet): 280 Feet Assistive device: Rolling walker Gait Assistance Details: Verbal cues for precautions/safety;Verbal cues for technique;Verbal cues for sequencing;Verbal cues for safe use of DME/AE Gait Gait: Yes Gait Pattern: Impaired Gait Pattern: Step-through pattern;Trunk flexed;Narrow base of support Gait velocity: decreased Stairs / Additional Locomotion Stairs: Yes Stairs Assistance: Moderate Assistance - Patient 50 - 74% Stair Management Technique: One rail Right Number of Stairs: 8 Height of Stairs: 6 Wheelchair Mobility Wheelchair Mobility: Yes Wheelchair Assistance: Dependent - Patient 0% (patient unable to sustain attention on task) Wheelchair Parts Management: Needs assistance Distance: 150  Trunk/Postural Assessment  Cervical Assessment Cervical Assessment: Exceptions to [facility] Thoracic Assessment Thoracic Assessment: Exceptions to [facility] Lumbar Assessment Lumbar Assessment: Exceptions to [facility] Postural Control Postural Control: Deficits on evaluation Trunk Control: occasional min to mod L lean in sitting/standing Righting Reactions: delayed and inadequate Protective Responses: delayed and inadequate  Balance Balance Balance Assessed: Yes Static Sitting Balance Static Sitting - Balance Support: Feet supported Static Sitting - Level of Assistance: 5: Stand by assistance Dynamic Sitting Balance Dynamic Sitting - Balance Support: During functional activity Dynamic Sitting - Level of Assistance: 4: Min assist Sitting balance - Comments: Close supervision/CGA when reaching outside BOS Static Standing Balance Static Standing - Balance Support: Bilateral upper extremity supported Static Standing - Level of Assistance: 4: Min assist Dynamic Standing Balance Dynamic Standing - Balance  Support: Bilateral upper extremity supported;During functional activity Dynamic Standing - Level of Assistance: 4: Min assist Extremity Assessment      RLE Assessment RLE Assessment: Within Functional Limits LLE Assessment LLE Assessment: Exceptions to [facility] LLE Strength Left Hip Flexion: 3-/5 Left Hip ABduction: 3-/5 Left Hip ADduction: 3-/5 Left Knee Flexion: 3-/5 Left Knee Extension: 3-/5 Left Ankle Dorsiflexion: 3-/5 Left Ankle Plantar Flexion: 3-/5 LLE Tone LLE Tone: Hypertonic;Modified Ashworth Body Part - Modified Ashworth Scale: Hamstrings;Quadriceps Hamstrings - Modified Ashworth Scale for Grading Hypertonia LLE: Slight increase in muscle tone, manifested by a catch, followed by minimal resistance throughout the remainder (less than half) of the ROM Quadriceps - Modified Ashworth Scale for Grading Hypertonia LLE: Slight increase in  muscle tone, manifested by a catch, followed by minimal resistance throughout the remainder (less than half) of the ROM   Debbora Dus 04/22/2020, 4:43 PM

## 2020-04-22 NOTE — Progress Notes (Signed)
[place] PHYSICAL MEDICINE & REHABILITATION PROGRESS NOTE   Subjective/Complaints:  Pt not oriented to time, no agitation this am  Family training on car xfers to be completed prior to D/C  ROS: + left sided neck pain  Objective:   CT HEAD WO CONTRAST  Result Date: 04/20/2020 CLINICAL DATA:  Head trauma.  Hit his head against the wall. EXAM: CT HEAD WITHOUT CONTRAST TECHNIQUE: Contiguous axial images were obtained from the base of the skull through the vertex without intravenous contrast. COMPARISON:  CT head February 29, 2020. FINDINGS: Brain: Evolving right MCA territory infarcts with decreased mass effect and progressive encephalomalacia. No evidence of acute hemorrhage. No evidence of interval/new acute large vascular territory infarct. Remote lacunar infarcts in the right cerebellum. Additional patchy white matter hypoattenuation, most likely related to chronic microvascular ischemic disease. No hydrocephalus. No mass lesion. No midline shift. Vascular: Calcific atherosclerosis. Skull: No acute fracture. Sinuses/Orbits: Visualized sinuses are clear.  Unremarkable orbits. Other: No mastoid effusions. IMPRESSION: 1. No evidence of acute intracranial abnormality. 2. Evolving right MCA territory infarcts. 3. Remote right cerebellar lacunar infarcts and chronic microvascular ischemic disease. Electronically Signed   By: Feliberto Harts MD   On: 04/20/2020 14:26   Recent Labs    04/21/20 0613 04/22/20 0615  WBC 5.3 4.5  HGB 10.5* 10.3*  HCT 31.6* 30.5*  PLT 210 202   Recent Labs    04/21/20 0613 04/22/20 0615  NA 141 142  K 4.4 4.3  CL 107 108  CO2 24 25  GLUCOSE 114* 104*  BUN 27* 28*  CREATININE 1.32* 1.49*  CALCIUM 9.2 8.8*    Intake/Output Summary (Last 24 hours) at 04/22/2020 0817 Last data filed at 04/21/2020 1826 Gross per 24 hour  Intake 340 ml  Output --  Net 340 ml    Physical Exam: Vital Signs Blood pressure 140/75, pulse (!) 56, temperature 98.1 F  (36.7 C), temperature source Oral, resp. rate 18, height 5\' 11"  (1.803 m), weight 67.7 kg, SpO2 100 %.  Gen: no distress, normal appearing HEENT: oral mucosa pink and moist, NCAT Cardio: Bradycardia Chest: normal effort, normal rate of breathing Abd: soft, non-distended Ext: no edema Skin: intact Psych: pleasant, normal affect Motor: Left upper extremity shoulder abduction 3-/5, distally 4/5- unchanged  Left lower extremity: 4 -to 4/5 proximal to distal  Assessment/Plan: 1. Functional deficits which require 3+ hours per day of interdisciplinary therapy in a comprehensive inpatient rehab setting.  Physiatrist is providing close team supervision and 24 hour management of active medical problems listed below.  Physiatrist and rehab team continue to assess barriers to discharge/monitor patient progress toward functional and medical goals  Care Tool:  Bathing    Body parts bathed by patient: Chest,Abdomen,Face   Body parts bathed by helper: Left arm,Right arm Body parts n/a: Front perineal area,Buttocks,Right upper leg,Left upper leg,Right lower leg,Left lower leg (pt declined washing these parts)   Bathing assist Assist Level: Maximal Assistance - Patient 24 - 49%     Upper Body Dressing/Undressing Upper body dressing   What is the patient wearing?: Pull over shirt    Upper body assist Assist Level: Maximal Assistance - Patient 25 - 49%    Lower Body Dressing/Undressing Lower body dressing      What is the patient wearing?: Pants,Incontinence brief     Lower body assist Assist for lower body dressing: Maximal Assistance - Patient 25 - 49%     Toileting Toileting Toileting Activity did not occur and  hygiene only): N/A (no void or bm)  Toileting assist Assist for toileting: Minimal Assistance - Patient > 75%     Transfers Chair/bed transfer  Transfers assist     Chair/bed transfer assist level: Moderate Assistance - Patient 50 -  74% Chair/bed transfer assistive device: Arboriculturist assist      Assist level: Minimal Assistance - Patient > 75% Assistive device: Walker-rolling Max distance: 282   Walk 10 feet activity   Assist     Assist level: Minimal Assistance - Patient > 75% Assistive device: Walker-rolling   Walk 50 feet activity   Assist Walk 50 feet with 2 turns activity did not occur: Safety/medical concerns (due to patient fatigue/weakness)  Assist level: Minimal Assistance - Patient > 75% Assistive device: Walker-rolling    Walk 150 feet activity   Assist Walk 150 feet activity did not occur: Safety/medical concerns (due to patient fatigue/weakness)  Assist level: Minimal Assistance - Patient > 75% Assistive device: Walker-rolling    Walk 10 feet on uneven surface  activity   Assist Walk 10 feet on uneven surfaces activity did not occur: Safety/medical concerns (due to patient fatigue/weakness)         Wheelchair     Assist Will patient use wheelchair at discharge?: Yes Type of Wheelchair: Manual    Wheelchair assist level: Dependent - Patient 0% (TIS w/c) Max wheelchair distance: 150    Wheelchair 50 feet with 2 turns activity    Assist        Assist Level: Dependent - Patient 0%   Wheelchair 150 feet activity     Assist      Assist Level: Dependent - Patient 0%    Medical Problem List and Plan: 1. 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    Qd therapy , may hold over the weekend, plan D/C next week  (not Monday 12/27)  DC Wednesday SARS Cov 2 PCR neg    2.  Antithrombotics: -DVT/anticoagulation:  Pharmaceutical: Other (comment)--Eliquis.              -antiplatelet  therapy: N/A--has been off ASA/Brilinta.  3. Chronic HA/Cervicalgia/Pain Management: Was on flexeril prn PTA. Will monitor N/A.   Continue K pad and cervical spine XR given new neck pain and TTP on cervical spine.  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  12/18- pt c/o pain in legs- wasn't specific about joint, etc- will give prn meds.              Monitor with increased exertion  Muscle rub ordered on 11/26   12/5 encouraged him to use kpad! 4. Mood: LCSW to follow for evaluation and support.              -antipsychotic agents: Monitor for now.   -Less somnolent with decrease in klonopin- continue to monitor.   12/18- was restless, almost agitated, trying to GET OOB per nursing yesterday- if continues, might need to add something else prn.   12/19- added seroquel 25 mg TID Prn and 50 mg QHS prn- might need to come off to go to SNF< but nursing couldn't handle without it yesterday per them.  5. Neuropsych: This patient is not capable of making decisions on his own behalf. 6. Skin/Wound Care: Routine pressure relief measures. Protein  supplement due to evidence of malnutrition.  7. Fluids/Electrolytes/Nutrition: Monitor I/Os.   9. HTN: Monitor BP educate on importance of compliance. SBP goal 130-150   Vitals:   04/22/20 0450 04/22/20 0800  BP: 140/75   Pulse: (!) 51 (!) 56  Resp: 18   Temp: 98.1 F (36.7 C)   SpO2: 100%   well controlled: continue current regimen.  10. ICM/Heart failure with persistent apical thrombus: Monitor for signs and symptoms of fluid overload. Daily weights. Continue Eliquis 11. A fib: Monitor HR -continue Lanoxin and Eliquis.  12. Acute on chronic renal failure: Likely due to gastroenteritis.   12/27: Creatinine trending upward to 1.32 on 12/27. Placed nursing order to encourage hydration, repeat Cr tomorrow.  13. Electrolytes abnormalities: Resolved post supplementation 14. Anemia: Recheck iron levels.                    Hemoglobin 9.7  on 11/22 , 9.3 on 12/6, 10.6 on 12/27- monitor weekly.  15. T2DM with hyperglycemia: Hgb A1C-6.8.  probable neuropathy trial gabapentin low dose   Will monitor BS ac/hs.Add CM restrictions to diet.              Monitor with increased mobility CBG (last 3)  Recent Labs    04/21/20 0602 04/21/20 1209 04/22/20 0615  GLUCAP 112* 138* 92  Well controlled off medication 16. 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 MSSA bacteremia  TEE showed no valve vegetations, no other sig structural abnl in report cardiology to f/u with recs  afeb off IV cefazolin  , WBCs normal  19.  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 Urinary incontinence - mainly due to cognition , cont timed toileting 21.  CKD 3 mild elevation over baseline likely due to inconsistent intake     LOS: 48 days A FACE TO FACE EVALUATION WAS PERFORMED  Erick Colace 04/22/2020, 8:17 AM

## 2020-04-22 NOTE — Progress Notes (Signed)
Physical Therapy Session Note  Patient Details  Name: [name] MRN: [id number] Date of Birth: [birth year]  Today's Date: 04/22/2020 PT Individual Time: 9702-6378 PT Individual Time Calculation (min): 28 min  and Today's Date: 04/22/2020 PT Missed Time: 17 Minutes Missed Time Reason: Patient unwilling to participate  Short Term Goals: Week 6:  PT Short Term Goal 1 (Week 6): STG= LTG based on ELOS  Skilled Therapeutic Interventions/Progress Updates:   Patient received sidelying in bed attempting to climb over bedrails. He denies pain. Agreeable to assist to ambulate to bathroom. Discussed with patient importance of waiting for assist prior to moving outside of bed. Patient able to verbalize consequences experienced from recent fall where he tried to leave bed unassisted. He was able to come to sit edge of bed with CGA and ambulated to bathroom with HHA and CGA/MinA. Patient with no lateral lean and no LOB noted. He was able to have a bowel movement and void. Supervision for perihygiene in standing with CGA for postural stability. MinA for clothing management in standing. Patient requesting to return to bed. He was able to ambulate to sink for hand hygiene with MinA and no AD. Returning to bed, supervision for sit > supine transition. PT unable to redirect patient to continue participation in therapy. Bed alarm on, call light within reach.    Therapy Documentation Precautions:  Precautions Precautions: Fall Precaution Comments: L hemi, L inattention Restrictions Weight Bearing Restrictions: No    Therapy/Group: Individual Therapy  Elizebeth Koller, PT, DPT, CBIS  04/22/2020, 7:37 AM

## 2020-04-22 NOTE — Progress Notes (Signed)
Occupational Therapy Discharge Summary  Patient Details  Name: [name] MRN: [id number] Date of Birth: 01/13/1941  Patient has met 8 of 13 long term goals due to improved activity tolerance, improved balance, postural control, ability to compensate for deficits, functional use of  LEFT upper extremity, improved attention, improved awareness, and improved coordination.  Patient to discharge at overall Mod Assist level.  Patient's care partner unavailable to provide the necessary physical + cognitive assistance at discharge. Rec 24/7 physical and cog assist at DC, pt planned DC to LTC with rec of continued skilled OT to target LUE NMR + functional cognition + safety awareness to maximize safe ADL/functional mobility performance. Rec 3in1 if not available at LTC and cont EOB bathing due to pt impulsivity.   Reasons goals not met: Pt primarily limited by decreased cognition + increased confusion + increased agitation. Pt additionally cont to perseverate on telling stories or returning to bed, resulting in impaired sustained attention to tasks. Resultantly, pt's waxing and waning of agitation/orientation require increased levels of assist to complete ADL tasks safely and thoroughly.  Recommendation:  Patient will benefit from ongoing skilled OT services in skilled nursing facility setting to continue to advance functional skills in the area of BADL and Reduce care partner burden.  Equipment: No equipment provided , rec 3in1 if not available at DC site.  Reasons for discharge: discharge from hospital  Patient/family agrees with progress made and goals achieved: No , family not present during OT session. Pt cognitively impaired, limiting capacity to agree OT POC/ DC.   OT Discharge Precautions/Restrictions  Precautions Precautions: Fall Precaution Comments: L hemi, L inattention Restrictions Weight Bearing Restrictions: No Pain Pain Assessment Pain Scale: Faces Faces Pain Scale: Hurts even  more Pain Type: Chronic pain Pain Location: Neck ADL ADL Eating: Supervision/safety Where Assessed-Eating: Wheelchair Grooming: Minimal assistance Where Assessed-Grooming: Sitting at sink Upper Body Bathing: Minimal assistance Where Assessed-Upper Body Bathing: Sitting at sink Lower Body Bathing: Minimal assistance Where Assessed-Lower Body Bathing: Sitting at sink Upper Body Dressing: Maximal assistance Where Assessed-Upper Body Dressing: Edge of bed,Sitting at sink Lower Body Dressing: Maximal assistance Where Assessed-Lower Body Dressing: Edge of bed,Sitting at sink Toileting: Moderate assistance Where Assessed-Toileting: Glass blower/designer: Psychiatric nurse Method: Arts development officer: Bedside commode,Grab bars Tub/Shower Transfer: Not assessed Vision Baseline Vision/History: Wears glasses Wears Glasses: At all times Patient Visual Report: No change from baseline Vision Assessment?: Yes Eye Alignment: Within Functional Limits Ocular Range of Motion: Within Functional Limits Alignment/Gaze Preference: Within Defined Limits Tracking/Visual Pursuits: Able to track stimulus in all quads without difficulty Saccades: Overshoots (overhsoots target) Convergence: Impaired (comment) (very minimal convergence/divergence) Visual Fields: No apparent deficits Additional Comments: R gaze preference Perception  Perception: Impaired Inattention/Neglect: Appears intact Body Scheme: Occassional minimal to mod L lean when seated/standing Praxis Praxis: Impaired Praxis Impairment Details: Initiation;Motor planning;Perseveration Praxis-Other Comments: Decreased initiation of ADL tasks Cognition Overall Cognitive Status: Impaired/Different from baseline Arousal/Alertness: Awake/alert Orientation Level: Oriented to person Attention: Focused;Sustained Focused Attention: Impaired Sustained Attention: Impaired Sustained Attention Impairment:  Functional basic;Verbal basic Memory: Impaired Memory Impairment: Decreased short term memory;Decreased recall of new information Decreased Short Term Memory: Verbal basic;Functional basic Awareness: Impaired Awareness Impairment: Intellectual impairment Problem Solving: Impaired Problem Solving Impairment: Verbal basic;Functional basic Reasoning: Impaired Sequencing: Impaired Organizing: Impaired Decision Making: Impaired Self Monitoring: Impaired Behaviors: Impulsive;Verbal agitation;Poor frustration tolerance;Perseveration;Agitated Behavior Scale Safety/Judgment: Impaired Comments: Cont to be perseverate on telling the same stories, perseverate on going back to bed or to  the bathroom although there is no physical need Sensation Sensation Light Touch: Appears Intact Hot/Cold: Appears Intact Proprioception: Appears Intact Stereognosis: Appears Intact Additional Comments: unable to formally assess due to confusion/agitaiton Coordination Gross Motor Movements are Fluid and Coordinated: No Fine Motor Movements are Fluid and Coordinated: No Coordination and Movement Description: Pt unable to complete L thumb - digit opposition when verbally instruction + visual demonstration, unable to fully assess 2/2 confusion; Brummstrom level III for L arm, IV for hand Heel Shin Test: limited L LE Motor  Motor Motor: Hemiplegia;Abnormal tone;Abnormal postural alignment and control Motor - Skilled Clinical Observations: LUE and LLE hemiplegia Motor - Discharge Observations: LUE and LLE hemiplegia Mobility  Bed Mobility Bed Mobility: Rolling Right;Rolling Left;Supine to Sit;Sit to Supine Rolling Right: Contact Guard/Touching assist Rolling Left: Contact Guard/Touching assist Left Sidelying to Sit: Moderate Assistance - Patient 50-74% Supine to Sit: Contact Guard/Touching assist Sitting - Scoot to Edge of Bed: Moderate Assistance - Patient 50-74% Sit to Supine: Contact Guard/Touching  assist Sit to Sidelying Left: Minimal Assistance - Patient > 75% Transfers Sit to Stand: Minimal Assistance - Patient > 75% Stand to Sit: Minimal Assistance - Patient > 75%  Trunk/Postural Assessment  Cervical Assessment Cervical Assessment: Exceptions to [facility] Thoracic Assessment Thoracic Assessment: Exceptions to [facility] Lumbar Assessment Lumbar Assessment: Exceptions to [facility] Postural Control Postural Control: Deficits on evaluation Trunk Control: occasional min to mod L lean in sitting/standing Righting Reactions: delayed and inadequate Protective Responses: delayed and inadequate  Balance Balance Balance Assessed: Yes Static Sitting Balance Static Sitting - Balance Support: Feet supported Static Sitting - Level of Assistance: 5: Stand by assistance Dynamic Sitting Balance Dynamic Sitting - Balance Support: During functional activity Dynamic Sitting - Level of Assistance: 4: Min assist Sitting balance - Comments: Close supervision/CGA when reaching outside BOS Static Standing Balance Static Standing - Balance Support: Bilateral upper extremity supported Static Standing - Level of Assistance: 4: Min assist Dynamic Standing Balance Dynamic Standing - Balance Support: Bilateral upper extremity supported;During functional activity Dynamic Standing - Level of Assistance: 4: Min assist Extremity/Trunk Assessment RUE Assessment RUE Assessment: Within Functional Limits General Strength Comments: Not formally assessed but WFLs for selfcare tasks LUE Assessment LUE Assessment: Exceptions to [facility] Passive Range of Motion (PROM) Comments: limited L shoulder flexion to 40-50* 2/2 pain in shoulder Active Range of Motion (AROM) Comments: AAROM: L elbow flexion WFL, shoulder flexion limited 2/2 pain as noted above   Volanda Napoleon, MS, OTR/L 04/22/2020, 4:48 PM

## 2020-04-22 NOTE — Progress Notes (Signed)
Speech Language Pathology Weekly Progress and Session Note  Patient Details  Name: [name] MRN: [id number] Date of Birth: 21-Oct-1940  Beginning of progress report period: April 14, 2020 End of progress report period: April 22, 2020  Today's Date: 04/22/2020 SLP Individual Time: 1130-1156 SLP Individual Time Calculation (min): 26 min  Short Term Goals: Week 6: SLP Short Term Goal 1 (Week 6): STG=LTG due to remaining length of stay; pt currently pending LTC placement SLP Short Term Goal 1 - Progress (Week 6): Progressing toward goal    New Short Term Goals: Week 7: SLP Short Term Goal 1 (Week 7): STG=LTG due to remaining length of stay; pt currently pending LTC placement  Weekly Progress Updates: Ptcontinues to make slow, inconsistent progress toward his long term goals; minimal progress/change in his speech or cognition has been made since last reporting period.Due to intense level of physical and cognitive assistance needed by pt for his greatest safety, thearpy recommendations to his daughter (caregiver) have been SNF vs LTC placement. She has decided to purse LTC for pt, and he is currently awaiting placement in [place]. Pt is currently ~Modassist for basic familiar tasksdue tocognitive impairments impacting his basic problem solving, attention, frustration tolerance, short term memory, and awareness.He has also demonstrated fluctuations in verbal agitation and willingness to participate in therapies over the least 2-3 weeks, further limiting his potential for progress.Pt is still tolerating a dysphagia 2 (minced/ground) texturediet withthinliquids, with Supervision-Min A cues for clearance of left buccal pocketing of solids (much improved over last few weeks).He has also started to participate in trials of dysphagia 3 (mechanical soft) solids with SLP and is making good progress toward this goal. Pt also has mild-mod dysarthria and requires fluctuating levels of cueing (mostly  Min-Mod A) for use of overarticulation and increased vocal intensity to increase his speech intelligibility at the phrase/sentence level. Pt educationhas beenongoing, however no family has been present for ST sessions to date. Pt would continue to benefit from skilled ST while inpatient in order to maximize functional independence and reduce burden of care prior to discharge.Pt will needstrict24/7 supervisionand hands on care for his greatest safety and functioningat discharge,in addition to ST follow upat next level of care.     Intensity: Minumum of 1-2 x/day, 30 to 90 minutes Frequency: 3 to 5 out of 7 days Duration/Length of Stay: Pending LTC placement Treatment/Interventions: Cognitive remediation/compensation;Cueing hierarchy;Functional tasks;Patient/family education;Therapeutic Activities;Speech/Language facilitation;Dysphagia/aspiration precaution training;Internal/external aids   Daily Session  Skilled Therapeutic Interventions: Pt was seen for skilled ST targeting dysphagia and cognitive skills. SLP facilitated session with upgraded trial of dysphagia 3 (mechanical soft) textures and thin coffee. No overt s/sx aspiration noted across textures, and his mastication of dysphagia 3 solids was efficient. No evidence of pocketing or lingual residue noted today, and he is using safe swallow precautions with Supervision. Recommend upgrade to dysphagia 3 textures, continue thin liquids, intermittent supervision and set up assist with PO intake. Pt required overall Mod A verbal cues for recall of daily and discharge information. He did appear to have increased verbal safety awareness today when discussing a recent fall. He expressed that falling made him realize that he is unable to ambulate safely by himself and he will not attempt to get out of bed without assistance again. Due to fluctuating confusion and recall, question whether or not pt will be able to carryover this information. However,  he is demonstrating an improvement in intellectual awareness thus far. Pt left laying in bed with alarm set  and needs within reach. Continue per current plan of care.        Pain Pain Assessment Pain Scale: Faces Pain Score: 0-No pain Faces Pain Scale: No hurt  Therapy/Group: Individual Therapy  [name] 04/22/2020, 12:11 PM

## 2020-04-22 NOTE — Progress Notes (Signed)
Speech Language Pathology Discharge Summary  Patient Details  Name: [name] MRN: [id number] Date of Birth: 08/11/1940  Patient has met 7 of 8 long term goals.  Patient to discharge at overall Mod;Max;Min level.  Reasons goals not met: increase cueing required for functional recall/carryover of daily information   Clinical Impression/Discharge Summary:   Pt made slow, inconsistent gains and goals were downgraded during his stay due to fluctuating levels of confusion, agitation, and limited progress. He ultimately met 7 out of 8 long term goals this admission. Pt currently requires Min-Mod assist for most basic familiar tasks, but closer to Mod-Max for functional short term recall due to cognitive impairments impacting his attention, intellectual awareness, problem solving, and short term memory. He is occasionally still disoriented, but can be reoriented with use of external aids. As a result of cognitive impairments which have a negative impact on his safety, he will require strict 24/7 supervision and hands on assistance for his greatest safety and functioning at discharge - SNF or LTC is recommended by all therapies due to intensive level of skilled assistance and care required for ADLs. Pt is currently awaiting bed offer/placement. Pt is consuming a dysphagia 3 texture diet with thin liquids and has been reduced to intermittent supervision, due to improvements in his ability to use safe compensatory swallow strategies. He also still has mild-mod dysarthria that fluctuates with degree of lethargy and participation, requiring Min-Mod cues for use of strategies to compensate and achieve 80-85% intelligibility in conversation. Given swallow, speech, and cognitive deficits still present, recommend pt continue to receive skilled ST services upon discharge. Pt education is complete, however no family was ever present for ST sessions, despite SW's multiple attempts to scheudle and encouragement to attend.    Care Partner:  Caregiver Able to Provide Assistance: No     Recommendation:  LTACH;Skilled Nursing facility;24 hour supervision/assistance  Rationale for SLP Follow Up: Maximize cognitive function and independence;Reduce caregiver burden;Maximize functional communication;Maximize swallowing safety   Equipment: none   Reasons for discharge: Discharged from hospital   Patient/Family Agrees with Progress Made and Goals Achieved: Yes     E  04/24/2020, 10:30 AM    

## 2020-04-22 NOTE — Progress Notes (Signed)
Occupational Therapy Session Note  Patient Details  Name: [name] MRN: [id number] Date of Birth: 1941/03/25  Today's Date: 04/22/2020 OT Individual Time: 2376-2831 OT Individual Time Calculation (min): 27 min    Short Term Goals: Week 7:  OT Short Term Goal 1 (Week 7): Continue working on established LTGs downgraded to mod assist overall.  Skilled Therapeutic Interventions/Progress Updates:    Pt received sup in bed, bed alarm engaged, four rails up. Pt significantly confused this date, mistaking therapists as police and an acquaintance from home. Accused therapist repeatedly of trying to kill him, and that we broke into his house. Pt able to state he had a stroke, but incorrectly stated that he was home then in a classroom.   Sup > sit EOB with mod A to bring RLE off bed + lift trunk. Intermittent CGA to maintain static sitting 2/2 confusion and requesting to lay back down. Doffed hospital gown with min A to remove from around trunk, donned new shirt with max A to thread LUE, pull over head, and adjust over body. Donned pants with mod A to thread LLE, pull over L hip. Min A for sit <> stand for balance, min LOB towards L with min A to correct. Reassessed vision, LUE strength/tone, FMC, proprioception, and sensation in prep for discharge 12/29. Noted impaired convergence/diverence, overshoots target when tracking. Brummstrom level III for L arm, IV for hand, minimal tone during L elbow flexion, L shoulder external rotation. Pt endorses LUE numbness, but able to discriminate light touch on L digits. Grossly intact proprioception at L digits, unable to fully assess 2/2 pt agitation/confusion. Pt unable to complete L thumb - digit opposition when verbally instruction + visual demonstration, unable to fully assess 2/2 confusion. Increased pain noted with attempted PROM with left shoulder flexion and elbow flexion with pt voicing out pain as well as grabbing the LUE with his RUE to stop the ROM.  See  DC for more details. Pt completed bed mobility with min A (to bring LLE on bed) to return to sup in bed.   Pt left semi-reclined in bed, call bell in reach, four rails up, bed alarm engaged. Nursing aware of pt request for pain medication.  Therapy Documentation Precautions:  Precautions Precautions: Fall Precaution Comments: L hemi, L inattention Restrictions Weight Bearing Restrictions: No  Pain: Pain Assessment Pain Scale: Faces Faces Pain Scale: Hurts even more Pain Type: Chronic pain Pain Location: Neck ADL: See Care Tool for more details.   Therapy/Group: Individual Therapy  Claudie Revering, MS, OTR/L Perrin Maltese OTR/L  04/22/2020, 3:54 PM

## 2020-04-23 ENCOUNTER — Inpatient Hospital Stay (HOSPITAL_COMMUNITY): Payer: Medicare PPO

## 2020-04-23 LAB — GLUCOSE, CAPILLARY
Glucose-Capillary: 89 mg/dL (ref 70–99)
Glucose-Capillary: 148 mg/dL — ABNORMAL HIGH (ref 70–99)
Glucose-Capillary: 222 mg/dL — ABNORMAL HIGH (ref 70–99)
Glucose-Capillary: 116 mg/dL — ABNORMAL HIGH (ref 70–99)

## 2020-04-23 MED ORDER — BENZOCAINE 10 % MT GEL
Freq: Four times a day (QID) | OROMUCOSAL | Status: DC | PRN
  Filled 2020-04-23: qty 9

## 2020-04-23 MED ORDER — BENZOCAINE 10 % MT GEL: Freq: Every day | OROMUCOSAL | Status: DC | PRN

## 2020-04-23 MED ORDER — CHLORHEXIDINE GLUCONATE 0.12 % MT SOLN
15.0000 mL | Freq: Four times a day (QID) | OROMUCOSAL | Status: DC
  Administered 2020-04-23: 15 mL via OROMUCOSAL
  Filled 2020-04-23 (×2): qty 15

## 2020-04-23 NOTE — Progress Notes (Signed)
Physical Therapy Session Note  Patient Details  Name: [name] MRN: [id number] Date of Birth: 06-09-1940  Today's Date: 04/23/2020 PT Individual Time: 1300-1315 PT Individual Time Calculation (min): 15 min  and Today's Date: 04/23/2020 PT Missed Time: 45 Minutes Missed Time Reason: Patient unwilling to participate  Short Term Goals: Week 6:  PT Short Term Goal 1 (Week 6): STG= LTG based on ELOS  Skilled Therapeutic Interventions/Progress Updates:    Patient received supine in bed asking about when dtr will arrive, initially agreeable to PT and denying pain.  PT unsure at this time. PT had also attempted to see patient at 11am for family ed with dtr for training on car transfer to ensure safe travel to [place] to next facility. Dtr not present at 11am or 1pm. PT attempting again at 1:30pm and 2:45pm, Dtr not present. At 1pm, patient agreeable to dressing at bedlevel. He required ModA to don pants supine in bed. He was able to come to sit edge of bed with CGA and was able to don shirt with ModA. PT donning shoes/socks MaxA. When sitting edge of bed, patients lunch tray arrived. Patient requesting to discontinue therapy to eat lunch. He was able to return to sitting up in bed with MinA. Bed alarm on, call light within reach.   Therapy Documentation Precautions:  Precautions Precautions: Fall Precaution Comments: L hemi, L inattention Restrictions Weight Bearing Restrictions: No    Therapy/Group: Individual Therapy  Elizebeth Koller, PT, DPT, CBIS  04/23/2020, 7:36 AM

## 2020-04-23 NOTE — Significant Event (Signed)
Patient refused to have labs drawn this morning. Patient confused with resistant behaviors at baseline.Deatra Ina PA notified.

## 2020-04-23 NOTE — Progress Notes (Signed)
[place] PHYSICAL MEDICINE & REHABILITATION PROGRESS NOTE   Subjective/Complaints:  No issues overnite   ROS: mild neck pain posterior, No CP or SOB, appetite good no N/V/D  Objective:   No results found. Recent Labs    04/21/20 0613 04/22/20 0615  WBC 5.3 4.5  HGB 10.5* 10.3*  HCT 31.6* 30.5*  PLT 210 202   Recent Labs    04/21/20 0613 04/22/20 0615  NA 141 142  K 4.4 4.3  CL 107 108  CO2 24 25  GLUCOSE 114* 104*  BUN 27* 28*  CREATININE 1.32* 1.49*  CALCIUM 9.2 8.8*    Intake/Output Summary (Last 24 hours) at 04/23/2020 0811 Last data filed at 04/22/2020 1900 Gross per 24 hour  Intake 437 ml  Output --  Net 437 ml    Physical Exam: Vital Signs Blood pressure 138/61, pulse (!) 58, temperature 97.9 F (36.6 C), resp. rate 15, height 5\' 11"  (1.803 m), weight 68.5 kg, SpO2 100 %.   General: No acute distress Mood and affect are appropriate Heart: Regular rate and rhythm no rubs murmurs or extra sounds Lungs: Clear to auscultation, breathing unlabored, no rales or wheezes Abdomen: Positive bowel sounds, soft nontender to palpation, nondistended Extremities: No clubbing, cyanosis, or edema Skin: No evidence of breakdown, no evidence of rash   Motor: Left upper extremity shoulder abduction 3-/5, distally 4/5- unchanged  Left lower extremity: 4 -to 4/5 proximal to distal  Assessment/Plan: 1. Functional deficits which require 3+ hours per day of interdisciplinary therapy in a comprehensive inpatient rehab setting.  Physiatrist is providing close team supervision and 24 hour management of active medical problems listed below.  Physiatrist and rehab team continue to assess barriers to discharge/monitor patient progress toward functional and medical goals  Care Tool:  Bathing    Body parts bathed by patient: Face,Chest,Abdomen,Front perineal area,Right upper leg,Left upper leg,Right lower leg,Left lower leg,Left arm   Body parts bathed by helper: Right  arm Body parts n/a: Front perineal area,Buttocks,Right upper leg,Left upper leg,Right lower leg,Left lower leg (pt declined washing these parts)   Bathing assist Assist Level: Minimal Assistance - Patient > 75%     Upper Body Dressing/Undressing Upper body dressing   What is the patient wearing?: Pull over shirt    Upper body assist Assist Level: Maximal Assistance - Patient 25 - 49%    Lower Body Dressing/Undressing Lower body dressing      What is the patient wearing?: Pants     Lower body assist Assist for lower body dressing: Maximal Assistance - Patient 25 - 49%     Toileting Toileting Toileting Activity did not occur (Clothing management and hygiene only): N/A (no void or bm)  Toileting assist Assist for toileting: Minimal Assistance - Patient > 75%     Transfers Chair/bed transfer  Transfers assist     Chair/bed transfer assist level: Moderate Assistance - Patient 50 - 74% Chair/bed transfer assistive device: assist      Assist level: Minimal Assistance - Patient > 75% Assistive device: Hand held assist Max distance: 10   Walk 10 feet activity   Assist     Assist level: Minimal Assistance - Patient > 75% Assistive device: Hand held assist   Walk 50 feet activity   Assist Walk 50 feet with 2 turns activity did not occur: Safety/medical concerns (due to patient fatigue/weakness)  Assist level: Minimal Assistance - Patient > 75% Assistive device: Walker-rolling  Walk 150 feet activity   Assist Walk 150 feet activity did not occur: Safety/medical concerns (due to patient fatigue/weakness)  Assist level: Minimal Assistance - Patient > 75% Assistive device: Walker-rolling    Walk 10 feet on uneven surface  activity   Assist Walk 10 feet on uneven surfaces activity did not occur: Safety/medical concerns (due to patient fatigue/weakness)         Wheelchair     Assist Will patient  use wheelchair at discharge?: Yes Type of Wheelchair: Manual    Wheelchair assist level: Dependent - Patient 0% (TIS w/c) Max wheelchair distance: 150    Wheelchair 50 feet with 2 turns activity    Assist        Assist Level: Dependent - Patient 0%   Wheelchair 150 feet activity     Assist      Assist Level: Dependent - Patient 0%    Medical Problem List and Plan: 1. 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    Qd therapy , may hold over the weekend, plan D/C next week  (not Monday 12/27)  DC today with daughter  Who will transport pt to [place] LTC facility  SARS Cov 2 PCR neg    2.  Antithrombotics: -DVT/anticoagulation:  Pharmaceutical: Other (comment)--Eliquis.              -antiplatelet therapy: N/A--has been off ASA/Brilinta.  3. Chronic HA/Cervicalgia/Pain Management: Was on flexeril prn PTA. Will monitor N/A.   Continue K pad and cervical spine XR given new neck pain and TTP on cervical spine.  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  12/18- pt c/o pain in legs- wasn't specific about joint, etc- will give prn meds.              Monitor with increased exertion  Muscle rub ordered on 11/26   12/5 encouraged him to use kpad! 4. Mood: LCSW to follow for evaluation and support.              -antipsychotic agents: Monitor for now.   -Less somnolent with decrease in klonopin- continue to monitor.   12/18- was restless, almost agitated, trying to GET OOB per nursing yesterday- if continues, might need to add something else prn.   12/19- added seroquel 25 mg TID Prn and 50 mg QHS prn- might need to come off to go to SNF< but nursing couldn't handle without it yesterday per them.  5. Neuropsych: This patient is  not capable of making decisions on his own behalf. 6. Skin/Wound Care: Routine pressure relief measures. Protein supplement due to evidence of malnutrition.  7. Fluids/Electrolytes/Nutrition: Monitor I/Os.   9. HTN: Monitor BP educate on importance of compliance. SBP goal 130-150   Vitals:   04/22/20 2016 04/23/20 0635  BP: 122/61 138/61  Pulse: (!) 53 (!) 58  Resp: 15 15  Temp: 98.5 F (36.9 C) 97.9 F (36.6 C)  SpO2: 100% 100%  well controlled: continue current regimen.  10. ICM/Heart failure with persistent apical thrombus: Monitor for signs and symptoms of fluid overload. Daily weights. Continue Eliquis 11. A fib: Monitor HR -continue Lanoxin and Eliquis.  12. Acute on chronic renal failure: Likely due to gastroenteritis.   12/27: Creatinine trending upward to 1.32 on 12/27. Placed nursing order to encourage hydration, repeat Cr tomorrow.  13. Electrolytes abnormalities: Resolved post supplementation 14. Anemia: Recheck iron levels.                    Hemoglobin 9.7 on 11/22 , 9.3 on 12/6, 10.6 on 12/27- monitor weekly.  15. T2DM with hyperglycemia: Hgb A1C-6.8.  probable neuropathy trial gabapentin low dose   Will monitor BS ac/hs.Add CM restrictions to diet.              Monitor with increased mobility CBG (last 3)  Recent Labs    04/21/20 1209 04/22/20 0615 04/23/20 0633  GLUCAP 138* 92 89  Well controlled off medication 16. 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 MSSA bacteremia  TEE showed no valve vegetations, no other sig structural abnl in report cardiology to f/u with recs  afeb off IV cefazolin  , WBCs normal  19.  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 Urinary incontinence - mainly due to cognition , cont timed toileting 21.  CKD 3 mild elevation over baseline likely due to  inconsistent intake     LOS: 49 days A FACE TO FACE EVALUATION WAS PERFORMED  Erick Colace 04/23/2020, 8:11 AM

## 2020-04-23 NOTE — Progress Notes (Addendum)
Inpatient Rehabilitation Care Coordinator Discharge Note  The overall goal for the admission was met for:   Discharge location: Yes, [facility] [place]  Length of Stay: Yes, 49 Days  Discharge activity level: Yes,   Home/community participation: Yes  Services provided included: MD, RD, PT, OT, SLP, RN, CM, TR, Pharmacy, [place]: Private Insurance: [employer]  Choices offered to/list presented to: Daughter Olivia Mackie)  Follow-up services arranged: None  Comments (or additional information): Hospital Bed, Wheelchair, Conservation officer, nature   Patient/Family verbalized understanding of follow-up arrangements: Yes  Individual responsible for coordination of the follow-up plan: Daughter Olivia Mackie) [phone]  Confirmed correct DME delivered: Dyanne Iha 04/23/2020    Dyanne Iha

## 2020-04-23 NOTE — Plan of Care (Signed)
  Problem: Consults Goal: RH STROKE PATIENT EDUCATION Description: See Patient Education module for education specifics  Outcome: Completed/Met   Problem: RH BOWEL ELIMINATION Goal: RH STG MANAGE BOWEL WITH ASSISTANCE Description: STG Manage Bowel with Assistance. Min Outcome: Completed/Met   Problem: RH BLADDER ELIMINATION Goal: RH STG MANAGE BLADDER WITH ASSISTANCE Description: STG Manage Bladder With Min Assistance Outcome: Completed/Met   Problem: RH SKIN INTEGRITY Goal: RH STG SKIN FREE OF INFECTION/BREAKDOWN Description: Remain free of further breakdown with min assist Outcome: Completed/Met Goal: RH STG ABLE TO PERFORM INCISION/WOUND CARE W/ASSISTANCE Description: STG Able To Perform Incision/Wound Care With Assistance. Min assist Outcome: Completed/Met   Problem: RH SAFETY Goal: RH STG ADHERE TO SAFETY PRECAUTIONS W/ASSISTANCE/DEVICE Description: STG Adhere to Safety Precautions With Assistance/Device. Min assist Outcome: Completed/Met   Problem: RH COGNITION-NURSING Goal: RH STG USES MEMORY AIDS/STRATEGIES W/ASSIST TO PROBLEM SOLVE Description: STG Uses Memory Aids/Strategies With Assistance to Problem Solve. Min assist Outcome: Completed/Met   Problem: RH PAIN MANAGEMENT Goal: RH STG PAIN MANAGED AT OR BELOW PT'S PAIN GOAL Description: Less than 4 Outcome: Completed/Met   Problem: RH KNOWLEDGE DEFICIT Goal: RH STG INCREASE KNOWLEDGE OF DIABETES Description: Patient/Caregiver will be able to verbalize management of diabetes including medications, diet with cues/handouts Outcome: Completed/Met Goal: RH STG INCREASE KNOWLEDGE OF HYPERTENSION Description: Patient/caregiver will be able to verbalize management of hypertension including monitoring blood pressure, medication and diet with cues/handouts Outcome: Completed/Met Goal: RH STG INCREASE KNOWLEDGE OF DYSPHAGIA/FLUID INTAKE Description: Patient/caregiver will be able to verbalize safe swallowing strategies  with cues/handouts Outcome: Completed/Met

## 2020-04-23 NOTE — Progress Notes (Signed)
Nutrition Follow-up  DOCUMENTATION CODES:   Severe malnutrition in context of chronic illness  INTERVENTION:   -ContinueMagicCupTID with meals, each supplement provides 290 kcal and 9 grams of protein  - Continue Juven BID to aid in wound healing  - Double protein portions TID with meals  - Continue to encourage adequate PO intake and provide feeding assistance as needed  NUTRITION DIAGNOSIS:   Severe Malnutrition related to chronic illness (COPD, CVA) as evidenced by severe muscle depletion,severe fat depletion.  Ongoing  GOAL:   Patient will meet greater than or equal to 90% of their needs  Progressing  MONITOR:   PO intake,Supplement acceptance,Diet advancement,Labs,Weight trends,Skin  REASON FOR ASSESSMENT:   Consult Diet education  ASSESSMENT:   79 year old male with PMH of T2DM, HTN, CAD s/p CABG in 2020, ICM, cardioembolic CVA, COPD who was admitted on 02/28/20 after being found on the floor with left hemiparesis, facial droop, and multiple abrasions. 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 Hospital course further complicated by post-stroke dysphagia. Pt started on a dysphagia 1 diet with nectar-thick liquids. Admitted to CIR on 11/10.  Noted plan for d/c today.  Double portions and double protein have been ordered for pt via [employer] diet software. PO intake continues to be adequate. Noted pt refusing labs and medications intermittently.  Weight up a total of 6 lbs when compared to CIR admit weight. Weight gain is favorable in pt with severe malnutrition.  Meal Completion: 80-100%  Medications reviewed and include: SSI, Juven BID, vitamin C 250 mg daily  Labs reviewed: BUN 28, creatinine 1.49 CBG's: 89-148 x 24 hours  Diet Order:    Diet Order            DIET DYS 3 Room service appropriate? Yes; Fluid consistency: Thin  Diet effective now                 EDUCATION NEEDS:   Not appropriate for education at this time  Skin:  Skin Assessment:  Skin Integrity Issues: Other: non-pressure wound to left shoulder, right knee  Last BM:  04/14/20  Height:   Ht Readings from Last 1 Encounters:  03/10/20 5\' 11"  (1.803 m)    Weight:   Wt Readings from Last 1 Encounters:  04/23/20 68.5 kg    BMI:  Body mass index is 21.06 kg/m.  Estimated Nutritional Needs:   Kcal:  1800-2000  Protein:  85-100 grams  Fluid:  1.8-2.0 L    04/25/20, MS, RD, LDN Inpatient Clinical Dietitian Please see [facility] for contact information.

## 2020-04-23 NOTE — Progress Notes (Signed)
Patient refused all medications this morning and yelled at me to leave him alone

## 2020-04-24 LAB — CBC
HCT: 30.9 % — ABNORMAL LOW (ref 39.0–52.0)
Hemoglobin: 9.7 g/dL — ABNORMAL LOW (ref 13.0–17.0)
MCH: 30.3 pg (ref 26.0–34.0)
MCHC: 31.4 g/dL (ref 30.0–36.0)
MCV: 96.6 fL (ref 80.0–100.0)
Platelets: 185 10*3/uL (ref 150–400)
RBC: 3.2 MIL/uL — ABNORMAL LOW (ref 4.22–5.81)
RDW: 12 % (ref 11.5–15.5)
WBC: 5.7 10*3/uL (ref 4.0–10.5)
nRBC: 0 % (ref 0.0–0.2)

## 2020-04-24 LAB — GLUCOSE, CAPILLARY: Glucose-Capillary: 99 mg/dL (ref 70–99)

## 2020-04-24 NOTE — Progress Notes (Signed)
[place] PHYSICAL MEDICINE & REHABILITATION PROGRESS NOTE   Subjective/Complaints:    ROS: mild neck pain posterior, No CP or SOB, appetite good no N/V/D  Objective:   No results found. Recent Labs    04/22/20 0615 04/24/20 0455  WBC 4.5 5.7  HGB 10.3* 9.7*  HCT 30.5* 30.9*  PLT 202 185   Recent Labs    04/22/20 0615  NA 142  K 4.3  CL 108  CO2 25  GLUCOSE 104*  BUN 28*  CREATININE 1.49*  CALCIUM 8.8*    Intake/Output Summary (Last 24 hours) at 04/24/2020 7867 Last data filed at 04/23/2020 1810 Gross per 24 hour  Intake 698 ml  Output -  Net 698 ml    Physical Exam: Vital Signs Blood pressure 134/70, pulse (!) 56, temperature (!) 97.5 F (36.4 C), resp. rate 15, height 5\' 11"  (1.803 m), weight 68.1 kg, SpO2 100 %.    General: No acute distress Mood and affect are appropriate Heart: Regular rate and rhythm no rubs murmurs or extra sounds Lungs: Clear to auscultation, breathing unlabored, no rales or wheezes Abdomen: Positive bowel sounds, soft nontender to palpation, nondistended Extremities: No clubbing, cyanosis, or edema Skin: No evidence of breakdown, no evidence of rash, healing abrasion over knee  caps      Motor: Left upper extremity shoulder abduction 3-/5, distally 4/5- unchanged  Left lower extremity: 4 -to 4/5 proximal to distal  Assessment/Plan: 1. 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  Stable for D/C today F/u PCP in 3-4 weeks F/u PM&R 2 weeks See D/C summary See D/C instructions Care Tool:  Bathing    Body parts bathed by patient: Face,Chest,Abdomen,Front perineal area,Right upper leg,Left upper leg,Right lower leg,Left lower leg,Left arm   Body parts bathed by helper: Right arm Body parts n/a: Front perineal area,Buttocks,Right upper leg,Left upper leg,Right lower leg,Left lower leg (pt declined washing these parts)   Bathing assist Assist Level: Minimal Assistance - Patient > 75%     Upper Body  Dressing/Undressing Upper body dressing   What is the patient wearing?: Pull over shirt    Upper body assist Assist Level: Maximal Assistance - Patient 25 - 49%    Lower Body Dressing/Undressing Lower body dressing      What is the patient wearing?: Pants     Lower body assist Assist for lower body dressing: Maximal Assistance - Patient 25 - 49%     Toileting Toileting Toileting Activity did not occur (Clothing management and hygiene only): N/A (no void or bm)  Toileting assist Assist for toileting: Minimal Assistance - Patient > 75%     Transfers Chair/bed transfer  Transfers assist     Chair/bed transfer assist level: Minimal Assistance - Patient > 75% Chair/bed transfer assistive device: assist      Assist level: Minimal Assistance - Patient > 75% Assistive device: Walker-rolling Max distance: 280   Walk 10 feet activity   Assist     Assist level: Minimal Assistance - Patient > 75% Assistive device: Walker-rolling   Walk 50 feet activity   Assist Walk 50 feet with 2 turns activity did not occur: Safety/medical concerns (due to patient fatigue/weakness)  Assist level: Minimal Assistance - Patient > 75% Assistive device: Walker-rolling    Walk 150 feet activity   Assist Walk 150 feet activity did not occur: Safety/medical concerns (due to patient fatigue/weakness)  Assist level: Minimal Assistance - Patient > 75% Assistive device: Walker-rolling  Walk 10 feet on uneven surface  activity   Assist Walk 10 feet on uneven surfaces activity did not occur: Safety/medical concerns         Wheelchair     Assist Will patient use wheelchair at discharge?: Yes Type of Wheelchair: Manual Wheelchair activity did not occur: Refused  Wheelchair assist level: Dependent - Patient 0% (TIS w/c) Max wheelchair distance: 150    Wheelchair 50 feet with 2 turns activity    Assist    Wheelchair  50 feet with 2 turns activity did not occur: Refused   Assist Level: Dependent - Patient 0%   Wheelchair 150 feet activity     Assist  Wheelchair 150 feet activity did not occur: Refused   Assist Level: Dependent - Patient 0%    Medical Problem List and Plan: 1. 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    Plan for d/c today   DC today with daughter  Who will transport pt to [facility] facility  SARS Cov 2 PCR neg    2.  Antithrombotics: -DVT/anticoagulation:  Pharmaceutical: Other (comment)--Eliquis.              -antiplatelet therapy: N/A--has been off ASA/Brilinta.  3. Chronic HA/Cervicalgia/Pain Management: Was on flexeril prn PTA. Will monitor N/A.   Continue K pad and cervical spine XR given new neck pain and TTP on cervical spine.  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  12/18- pt c/o pain in legs- wasn't specific about joint, etc- will give prn meds.              Monitor with increased exertion  Muscle rub ordered on 11/26   12/5 encouraged him to use kpad! 4. Mood: LCSW to follow for evaluation and support.              -antipsychotic agents: Monitor for now.   -Less somnolent with decrease in klonopin- continue to monitor.   12/18- was restless, almost agitated, trying to GET OOB per nursing yesterday- if continues, might need to add something else prn.   12/19- added seroquel 25 mg TID Prn and 50 mg QHS prn- might need to come off to go to SNF< but nursing couldn't handle without it yesterday per them.  5. Neuropsych: This patient is not capable of making decisions on his own behalf. 6. Skin/Wound Care: Routine pressure relief measures. Protein supplement due to evidence of malnutrition.  7.  Fluids/Electrolytes/Nutrition: Monitor I/Os.   9. HTN: Monitor BP educate on importance of compliance. SBP goal 130-150   Vitals:   04/24/20 0515 04/24/20 0758  BP: 134/70   Pulse: (!) 54 (!) 56  Resp:    Temp: (!) 97.5 F (36.4 C)   SpO2: 100%   well controlled: continue current regimen.  10. ICM/Heart failure with persistent apical thrombus: Monitor for signs and symptoms of fluid overload. Daily weights. Continue Eliquis 11. A fib: Monitor HR -continue Lanoxin and Eliquis.  12. Acute on chronic renal failure: Likely due to gastroenteritis.   12/27: Creatinine trending upward to 1.32 on 12/27. Placed nursing order to encourage hydration, repeat Cr tomorrow.  13. Electrolytes abnormalities: Resolved post supplementation 14. Anemia: Recheck iron levels.                    Hemoglobin 9.7 on  11/22 , 9.3 on 12/6, 10.6 on 12/27- monitor weekly.  15. T2DM with hyperglycemia: Hgb A1C-6.8.  probable neuropathy trial gabapentin low dose   Will monitor BS ac/hs.Add CM restrictions to diet.              Monitor with increased mobility CBG (last 3)  Recent Labs    04/23/20 1821 04/23/20 2045 04/24/20 0549  GLUCAP 222* 116* 99  Well controlled off medication 16. 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 MSSA bacteremia  TEE showed no valve vegetations, no other sig structural abnl in report cardiology to f/u with recs  afeb off IV cefazolin  , WBCs normal  19.  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 Urinary incontinence - mainly due to cognition , cont timed toileting 21.  CKD 3 mild elevation over baseline likely due to inconsistent intake     LOS: 50 days A FACE TO FACE EVALUATION WAS PERFORMED  Erick Colace 04/24/2020, 8:08 AM

## 2020-04-24 NOTE — Progress Notes (Signed)
Patient discharged off of unit with all belongings. Discharge papers/instructions explained by physician assistant to family. Patient and family have no further questions at time of discharge. No complications noted at this time.  Blair L Montanti  

## 2020-05-07 ENCOUNTER — Encounter: Payer: Medicare PPO | Attending: Registered Nurse | Admitting: Registered Nurse
